# Patient Record
Sex: Female | Born: 1935 | Race: White | Hispanic: No | Smoking: Former smoker
Health system: Southern US, Community
[De-identification: ages and names within clinical notes are randomized; demographics above are authoritative.]

## PROBLEM LIST (undated history)

## (undated) DIAGNOSIS — F0390 Unspecified dementia without behavioral disturbance: Secondary | ICD-10-CM

## (undated) DIAGNOSIS — I639 Cerebral infarction, unspecified: Secondary | ICD-10-CM

## (undated) DIAGNOSIS — M419 Scoliosis, unspecified: Secondary | ICD-10-CM

## (undated) DIAGNOSIS — K279 Peptic ulcer, site unspecified, unspecified as acute or chronic, without hemorrhage or perforation: Secondary | ICD-10-CM

## (undated) DIAGNOSIS — M159 Polyosteoarthritis, unspecified: Secondary | ICD-10-CM

## (undated) DIAGNOSIS — M1990 Unspecified osteoarthritis, unspecified site: Secondary | ICD-10-CM

## (undated) DIAGNOSIS — N200 Calculus of kidney: Secondary | ICD-10-CM

## (undated) DIAGNOSIS — C801 Malignant (primary) neoplasm, unspecified: Secondary | ICD-10-CM

## (undated) DIAGNOSIS — I495 Sick sinus syndrome: Secondary | ICD-10-CM

## (undated) DIAGNOSIS — M6281 Muscle weakness (generalized): Secondary | ICD-10-CM

## (undated) DIAGNOSIS — D649 Anemia, unspecified: Secondary | ICD-10-CM

## (undated) DIAGNOSIS — G9340 Encephalopathy, unspecified: Secondary | ICD-10-CM

## (undated) DIAGNOSIS — K8689 Other specified diseases of pancreas: Secondary | ICD-10-CM

## (undated) DIAGNOSIS — I959 Hypotension, unspecified: Secondary | ICD-10-CM

## (undated) DIAGNOSIS — J189 Pneumonia, unspecified organism: Secondary | ICD-10-CM

## (undated) DIAGNOSIS — I509 Heart failure, unspecified: Secondary | ICD-10-CM

## (undated) DIAGNOSIS — K529 Noninfective gastroenteritis and colitis, unspecified: Secondary | ICD-10-CM

## (undated) DIAGNOSIS — R627 Adult failure to thrive: Secondary | ICD-10-CM

## (undated) HISTORY — PX: JOINT REPLACEMENT: SHX530

## (undated) HISTORY — PX: ABDOMINAL HYSTERECTOMY: SHX81

## (undated) HISTORY — PX: OTHER SURGICAL HISTORY: SHX169

## (undated) HISTORY — PX: CHOLECYSTECTOMY: SHX55

---

## 1998-08-02 ENCOUNTER — Encounter: Payer: Self-pay | Admitting: Gynecology

## 1998-08-09 ENCOUNTER — Inpatient Hospital Stay (HOSPITAL_COMMUNITY): Admission: RE | Admit: 1998-08-09 | Discharge: 1998-08-14 | Payer: Self-pay | Admitting: Orthopedic Surgery

## 1998-08-28 ENCOUNTER — Other Ambulatory Visit: Admission: RE | Admit: 1998-08-28 | Discharge: 1998-08-28 | Payer: Self-pay | Admitting: Obstetrics and Gynecology

## 1999-08-08 ENCOUNTER — Encounter: Payer: Self-pay | Admitting: Internal Medicine

## 1999-08-08 ENCOUNTER — Ambulatory Visit (HOSPITAL_COMMUNITY): Admission: RE | Admit: 1999-08-08 | Discharge: 1999-08-08 | Payer: Self-pay | Admitting: Internal Medicine

## 1999-09-16 ENCOUNTER — Other Ambulatory Visit: Admission: RE | Admit: 1999-09-16 | Discharge: 1999-09-16 | Payer: Self-pay | Admitting: Obstetrics and Gynecology

## 2000-05-18 ENCOUNTER — Encounter: Payer: Self-pay | Admitting: Orthopedic Surgery

## 2000-05-20 ENCOUNTER — Inpatient Hospital Stay (HOSPITAL_COMMUNITY): Admission: RE | Admit: 2000-05-20 | Discharge: 2000-05-25 | Payer: Self-pay | Admitting: Orthopedic Surgery

## 2000-05-20 ENCOUNTER — Encounter: Payer: Self-pay | Admitting: Orthopedic Surgery

## 2000-05-25 ENCOUNTER — Inpatient Hospital Stay (HOSPITAL_COMMUNITY)
Admission: RE | Admit: 2000-05-25 | Discharge: 2000-06-01 | Payer: Self-pay | Admitting: Physical Medicine & Rehabilitation

## 2000-08-18 ENCOUNTER — Encounter: Payer: Self-pay | Admitting: Obstetrics and Gynecology

## 2000-08-18 ENCOUNTER — Ambulatory Visit (HOSPITAL_COMMUNITY): Admission: RE | Admit: 2000-08-18 | Discharge: 2000-08-18 | Payer: Self-pay | Admitting: Obstetrics and Gynecology

## 2000-09-16 ENCOUNTER — Other Ambulatory Visit: Admission: RE | Admit: 2000-09-16 | Discharge: 2000-09-16 | Payer: Self-pay | Admitting: Obstetrics and Gynecology

## 2001-03-12 ENCOUNTER — Ambulatory Visit (HOSPITAL_COMMUNITY): Admission: RE | Admit: 2001-03-12 | Discharge: 2001-03-12 | Payer: Self-pay | Admitting: Family Medicine

## 2001-03-12 ENCOUNTER — Encounter: Payer: Self-pay | Admitting: Family Medicine

## 2001-07-29 ENCOUNTER — Encounter: Payer: Self-pay | Admitting: Orthopedic Surgery

## 2001-08-05 ENCOUNTER — Observation Stay (HOSPITAL_COMMUNITY): Admission: RE | Admit: 2001-08-05 | Discharge: 2001-08-06 | Payer: Self-pay | Admitting: Orthopedic Surgery

## 2002-03-21 ENCOUNTER — Observation Stay (HOSPITAL_COMMUNITY): Admission: RE | Admit: 2002-03-21 | Discharge: 2002-03-22 | Payer: Self-pay | Admitting: Urology

## 2002-03-21 ENCOUNTER — Encounter: Payer: Self-pay | Admitting: Urology

## 2002-04-19 ENCOUNTER — Ambulatory Visit (HOSPITAL_COMMUNITY): Admission: RE | Admit: 2002-04-19 | Discharge: 2002-04-19 | Payer: Self-pay | Admitting: Obstetrics and Gynecology

## 2002-04-19 ENCOUNTER — Encounter: Payer: Self-pay | Admitting: Obstetrics and Gynecology

## 2003-05-16 ENCOUNTER — Ambulatory Visit (HOSPITAL_COMMUNITY): Admission: RE | Admit: 2003-05-16 | Discharge: 2003-05-16 | Payer: Self-pay | Admitting: Obstetrics and Gynecology

## 2003-05-18 ENCOUNTER — Other Ambulatory Visit: Admission: RE | Admit: 2003-05-18 | Discharge: 2003-05-18 | Payer: Self-pay | Admitting: Obstetrics and Gynecology

## 2003-07-20 ENCOUNTER — Encounter: Admission: RE | Admit: 2003-07-20 | Discharge: 2003-07-20 | Payer: Self-pay | Admitting: Orthopedic Surgery

## 2003-07-21 ENCOUNTER — Ambulatory Visit (HOSPITAL_BASED_OUTPATIENT_CLINIC_OR_DEPARTMENT_OTHER): Admission: RE | Admit: 2003-07-21 | Discharge: 2003-07-21 | Payer: Self-pay | Admitting: Orthopedic Surgery

## 2003-07-21 ENCOUNTER — Ambulatory Visit (HOSPITAL_COMMUNITY): Admission: RE | Admit: 2003-07-21 | Discharge: 2003-07-21 | Payer: Self-pay | Admitting: Orthopedic Surgery

## 2004-05-27 ENCOUNTER — Ambulatory Visit (HOSPITAL_COMMUNITY): Admission: RE | Admit: 2004-05-27 | Discharge: 2004-05-27 | Payer: Self-pay | Admitting: Obstetrics and Gynecology

## 2004-06-20 ENCOUNTER — Other Ambulatory Visit: Admission: RE | Admit: 2004-06-20 | Discharge: 2004-06-20 | Payer: Self-pay | Admitting: Obstetrics and Gynecology

## 2005-05-29 ENCOUNTER — Ambulatory Visit (HOSPITAL_COMMUNITY): Admission: RE | Admit: 2005-05-29 | Discharge: 2005-05-29 | Payer: Self-pay | Admitting: Internal Medicine

## 2006-06-01 ENCOUNTER — Ambulatory Visit (HOSPITAL_COMMUNITY): Admission: RE | Admit: 2006-06-01 | Discharge: 2006-06-01 | Payer: Self-pay | Admitting: Obstetrics and Gynecology

## 2006-08-31 ENCOUNTER — Other Ambulatory Visit: Admission: RE | Admit: 2006-08-31 | Discharge: 2006-08-31 | Payer: Self-pay | Admitting: Obstetrics and Gynecology

## 2007-06-18 ENCOUNTER — Ambulatory Visit (HOSPITAL_COMMUNITY): Admission: RE | Admit: 2007-06-18 | Discharge: 2007-06-18 | Payer: Self-pay | Admitting: Obstetrics and Gynecology

## 2007-08-29 ENCOUNTER — Emergency Department (HOSPITAL_COMMUNITY): Admission: EM | Admit: 2007-08-29 | Discharge: 2007-08-29 | Payer: Self-pay | Admitting: Emergency Medicine

## 2008-03-27 ENCOUNTER — Inpatient Hospital Stay (HOSPITAL_COMMUNITY): Admission: EM | Admit: 2008-03-27 | Discharge: 2008-03-30 | Payer: Self-pay | Admitting: Emergency Medicine

## 2008-03-28 ENCOUNTER — Ambulatory Visit: Payer: Self-pay | Admitting: Orthopedic Surgery

## 2008-03-30 ENCOUNTER — Encounter: Payer: Self-pay | Admitting: Orthopedic Surgery

## 2008-06-17 ENCOUNTER — Emergency Department (HOSPITAL_COMMUNITY): Admission: EM | Admit: 2008-06-17 | Discharge: 2008-06-17 | Payer: Self-pay | Admitting: Emergency Medicine

## 2008-07-04 ENCOUNTER — Ambulatory Visit (HOSPITAL_COMMUNITY): Admission: RE | Admit: 2008-07-04 | Discharge: 2008-07-04 | Payer: Self-pay | Admitting: Family Medicine

## 2008-08-03 ENCOUNTER — Ambulatory Visit (HOSPITAL_BASED_OUTPATIENT_CLINIC_OR_DEPARTMENT_OTHER): Admission: RE | Admit: 2008-08-03 | Discharge: 2008-08-03 | Payer: Self-pay | Admitting: Orthopedic Surgery

## 2009-07-19 ENCOUNTER — Ambulatory Visit (HOSPITAL_COMMUNITY): Admission: RE | Admit: 2009-07-19 | Discharge: 2009-07-19 | Payer: Self-pay | Admitting: Family Medicine

## 2010-08-22 LAB — POCT I-STAT, CHEM 8
BUN: 20 mg/dL (ref 6–23)
Calcium, Ion: 1.21 mmol/L (ref 1.12–1.32)
Chloride: 109 mEq/L (ref 96–112)
Glucose, Bld: 92 mg/dL (ref 70–99)

## 2010-08-27 LAB — COMPREHENSIVE METABOLIC PANEL
BUN: 17 mg/dL (ref 6–23)
CO2: 28 mEq/L (ref 19–32)
Chloride: 103 mEq/L (ref 96–112)
Creatinine, Ser: 0.59 mg/dL (ref 0.4–1.2)
GFR calc non Af Amer: >60 mL/min (ref >60)
Total Bilirubin: 0.7 mg/dL (ref 0.3–1.2)

## 2010-08-27 LAB — DIFFERENTIAL
Basophils Absolute: 0 10*3/uL (ref 0.0–0.1)
Lymphocytes Relative: 7 % — ABNORMAL LOW (ref 12–46)
Monocytes Absolute: 0.7 10*3/uL (ref 0.1–1.0)
Monocytes Relative: 6 % (ref 3–12)
Neutro Abs: 11.4 10*3/uL — ABNORMAL HIGH (ref 1.7–7.7)

## 2010-08-27 LAB — URINALYSIS, ROUTINE W REFLEX MICROSCOPIC
Nitrite: NEGATIVE
Specific Gravity, Urine: >1.03 — ABNORMAL HIGH (ref 1.005–1.030)
Urobilinogen, UA: 0.2 mg/dL (ref 0.0–1.0)
pH: 5.5 (ref 5.0–8.0)

## 2010-08-27 LAB — URINE MICROSCOPIC-ADD ON

## 2010-08-27 LAB — CBC
Hemoglobin: 13.6 g/dL (ref 12.0–15.0)
RBC: 4.26 MIL/uL (ref 3.87–5.11)

## 2010-08-27 LAB — PROTIME-INR: INR: 1 (ref 0.00–1.49)

## 2010-09-24 NOTE — Op Note (Signed)
NAMEWANELL, LORENZI             ACCOUNT NO.:  [id number]   MEDICAL RECORD NO.:  [id number]          PATIENT TYPE:  AMB   LOCATION:  DSC                          FACILITY:  [facility]   PHYSICIAN:  Cindee Salt, M.D.       DATE OF BIRTH:  1918/11/08   DATE OF PROCEDURE:  08/03/2008  DATE OF DISCHARGE:                               OPERATIVE REPORT   PREOPERATIVE DIAGNOSIS:  Carpal tunnel syndrome, right hand.   POSTOPERATIVE DIAGNOSIS:  Carpal tunnel syndrome, right hand.   OPERATION:  Decompression, right median nerve.   SURGEON:  Cindee Salt, MD   ASSISTANT:  Carolyne Fiscal, RN   ANESTHESIA:  Forearm-based IV regional.   ANESTHESIOLOGIST:  Zenon Mayo, MD.   HISTORY:  The patient is an 77 year old female with a history of severe  carpal tunnel syndrome, right hand and this has not responded to  conservative treatment.  She has elected to undergo surgical  decompression.  Pre, peri, and postoperative course has been discussed  along with risks and complications.  She is aware that there is no  guarantee with the surgery, possibility of infection, recurrence injury  to arteries, nerves, and tendons, incomplete relief of symptoms, and  dystrophy.  In the preoperative area, the patient is seen.  The  extremity was marked by both the patient and surgeon.  Antibiotic is  given.   PROCEDURE:  The patient was brought to the operating room where a  forearm-based IV regional anesthetic was carried out without difficulty.  She was prepped using ChloraPrep, supine position with the right arm  free.  A time-out was taken confirming the patient and procedure.  A  longitudinal incision was made in the palm and carried down through the  subcutaneous tissue.  She had some feeling.  This was then locally  infiltrated with 0.25% Marcaine without epinephrine.  The dissection was  carried down splitting the palmar fascia.  The superficial palmar arch  was identified.  The flexor tendon of the ring  little finger was  identified to the ulnar side of median nerve.  The carpal retinaculum  was incised with sharp dissection.  A right-angle and Sewall retractor  were placed between the skin and forearm fascia.  The fascia was  released for approximately a centimeter and half proximal to the wrist  crease under direct vision.  The canal was explored.  Area of  compression to the nerve was immediately apparent.  No further lesions  were identified.  Tenosynovial tissue was moderately thickened.  The  wound was irrigated with saline.  The skin was then closed with  interrupted 5-0 Vicryl Rapide sutures.  A sterile compressive dressing  and splint to the  wrist were applied leaving the fingers free.  On deflation of the  tourniquet, all fingers immediately pinked.  She was taken to the  recovery room for observation in satisfactory condition.  She will be  discharged home to return to the [facility] in 1 week on  Vicodin.           ______________________________  Cindee Salt, M.D.  GK/MEDQ  D:  08/03/2008  T:  08/03/2008  Job:  [id number]   cc:   Angus G. Renard Matter, MD

## 2010-09-24 NOTE — Discharge Summary (Signed)
NAMEAMOY, STEEVES             ACCOUNT NO.:  [id number]   MEDICAL RECORD NO.:  [id number]          PATIENT TYPE:  INP   LOCATION:  A317                          FACILITY:  [facility]   PHYSICIAN:  Vickki Hearing, M.D.DATE OF BIRTH:  Apr 14, 1919   DATE OF ADMISSION:  03/27/2008  DATE OF DISCHARGE:  11/19/2009LH                               DISCHARGE SUMMARY   ADMITTING DIAGNOSIS:  Fractured superior and inferior pubic ramus, left  pelvis.   DISCHARGE DIAGNOSIS:  Fractured superior and inferior pubic ramus, left  pelvis.   HISTORY:  She is 77 years old.  She fell on the day of admission.  She  injured her left pelvis, minimally displaced nonoperative fracture.  She  has a history of 2 right total hip arthroplasties, one left total knee  arthroplasty, status post hysterectomy, cholecystectomy, renal stone  extraction.   Family history of breast cancer.   Allergic to SULFA.  ASPIRIN causes GI upset.   History of UTI, endometrial cancer with no recurrence.   She has a congenital heart murmur and rheumatoid arthritis.   She was admitted, placed on bedrest.  When she tried to get up, she had  pain, which would not allow weightbearing with a walker and assistance,  so we recommended skilled nursing care, she agreed   Orthopedic instructions, weightbear as tolerated.  Advance therapy as  tolerated with a walker.  Full weightbearing as tolerated.   MEDICATIONS:  1. Prednisone 2 mg p.o. daily.  2. Fosamax 70 mg p.o. once a week.  3. Pain medicine Darvocet 1 q. 4 p.r.n. for pain.  4. DVT prevention, heparin 5000 units subcu q.8 h. for balance of 28      days, which will be 25 more days from now.   Followup in a month with Dr. Romeo Apple [phone].   DISCHARGE DISPOSITION:  Skilled nursing care [facility] or [facility] to be determined      Vickki Hearing, M.D.  Electronically Signed     SEH/MEDQ  D:  03/30/2008  T:  03/30/2008  Job:  [id number]

## 2010-09-24 NOTE — H&P (Signed)
NAMEKAREM, FARHA             ACCOUNT NO.:  [id number]   MEDICAL RECORD NO.:  [id number]          PATIENT TYPE:  INP   LOCATION:  A317                          FACILITY:  [facility]   PHYSICIAN:  Vickki Hearing, M.D.DATE OF BIRTH:  1918-08-19   DATE OF ADMISSION:  03/27/2008  DATE OF DISCHARGE:  LH                              HISTORY & PHYSICAL   CHIEF COMPLAINT:  Pain in the left groin.   HISTORY:  This is an 77 year old female who presented after a fall on  the day of admission.  She is known to have history of 2 right total hip  arthroplasties, a left total knee arthroplasty, status post a  hysterectomy and cholecystectomy, history of renal stones with  extraction.  Family history is significant for breast cancer.  She is  allergic to SULFA.  ASPIRIN causes GI upset.  She has history of UTI and  endometrial cancer with no recurrence.  Back in 1985, she had a  congenital heart murmur and rheumatoid arthritis.   She fell, she came to the emergency room on March 27, 2008, via EMS.  She was worked up and found to have a left superior and inferior pubic  ramus fracture and could not walk.  She has no primary care physician.  She says she sees Dr. Renard Matter on occasion.  She has no other complaints.  Her medicines are Fosamax once a week and 2 mg of prednisone daily.   PHYSICAL EXAMINATION:  VITAL SIGNS:  This morning were 97.9 temperature,  pulse 69, respiratory rate 18, blood pressure 118/69.  APPEARANCE:  She was small frame, normal development, grooming, hygiene.  No gross deformities.  CARDIOVASCULAR:  Showed good pulse and perfusion.  No swelling or edema.  LYMPH NODES:  Negative in groin.  SKIN:  Warm, dry, and intact with no lesions in 4 extremities.  NEUROLOGIC:  She was awake, alert, and oriented x3.  Mood and affect  normal.  Coordination tests were normal.  Sensation was normal.  Reflexes in the lower extremities were normal.  Upper extremities  normal.  PSYCHIATRIC:  Normal, alert and orientation x3.  Mood and affect normal.  MUSCULOSKELETAL:  Normal upper extremities on inspection with no  contractures.  No muscle atrophy, normal muscle tone, and no instability  or joint subluxation.  EXTREMITIES:  Right lower extremity on inspection was well aligned with  normal range of motion, motor strength, and stability of the hip, knee,  and ankle.  Left lower extremity exam was held in slight external  rotation, but equal leg lengths to the right.  There was pain with  internal rotation.  There was a normal muscle tone, no joint  contractures, no joint subluxation, and there was no tenderness over the  greater trochanter.   Radiographs show a minimally displaced superior and inferior pubic ramus  fracture on the left and old right total hip arthroplasty which seems  like it has some cup invagination into the pelvis, but it is stable.   ASSESSMENT:  An 77 year old female lives alone with pelvic fracture  nonsurgical, requires of 24 hours of bedrest, pain  management, and then  aggressive physical therapy, and then transfer to skilled nursing.      Vickki Hearing, M.D.  Electronically Signed     SEH/MEDQ  D:  03/28/2008  T:  03/28/2008  Job:  [id number]

## 2010-09-27 NOTE — Op Note (Signed)
[name], [name]                       ACCOUNT NO.:  [id number]   MEDICAL RECORD NO.:  [id number]                   PATIENT TYPE:  AMB   LOCATION:  DAY                                  FACILITY:  [facility]   PHYSICIAN:  Bertram Millard. Dahlstedt, M.D.          DATE OF BIRTH:  1918/08/16   DATE OF PROCEDURE:  DATE OF DISCHARGE:  03/21/2002                                 OPERATIVE REPORT   PREOPERATIVE DIAGNOSIS:  Right renal calculi.   POSTOPERATIVE DIAGNOSIS:  Right renal calculi.   PRINCIPAL PROCEDURE:  1. Cystoscopy.  2. Right retrograde ureteropyelogram.  3. Homium laser lithotripsy, right lower pole stones, ureteroscopically.  4. Double-J stent placement.  5. Interpretation of fluoroscopy.   SURGEON:  Bertram Millard. Dahlstedt, M.D.   ANESTHESIA:  General with LMA.   COMPLICATIONS:  None.   BRIEF HISTORY:  This 77 year old female status post two prior lithotripsies  in [place].  She recently moved to [place].  The patient has recurrent  right flank pain.  The stone, which was at her UPJ, was incompletely treated  with those two prior lithotripsies.  It was felt that, especially with her  body habitus of scoliosis, it would be quite difficult and probably  unsuccessful to repeat extracorporeal shock wave lithotripsy.  It was  recommended that she undergo direct ureteroscopy and holmium laser  lithotripsy.  Risks and complications as well as alternative to ESWL were  discussed with this patient.  She desires to proceed.   As the patient has scoliosis, is prednisone dependent, and is elderly, it  was recommended that she spend the night in the hospital.   DESCRIPTION OF PROCEDURE:  The patient was administered preoperative IV  antibiotics and Solu-Cortef and was taken to the operating room where  general anesthetic was induced.  She was placed in the dorsal lithotomy  position.  Her genitalia and perineum were prepped and draped.  A 25 French  panendoscope was advanced  into her bladder which was inspected and found to  be normal.  A bulb tip retrograde was performed showing no hydronephrosis  and a normal right ureter.  Filling defects were seen at the lower pole  calyx.  A guidewire was then advanced into the right renal pelvis and over  top of this a 35-cm x 15 French ureteral dilator access sheath.  The core  was removed and flexible ureteroscope was passed through the  ureteral  catheter and into the right renal pelvis.  The scope was used to inspect the  renal pelvis and calyces which were found to be otherwise normal except for  a moderate sized stone in the lower pole calyx.  A 240-micron laser tip was  then passed through the ureteroscope and the holmium laser was used to  fragment the one stone into multiple small fragments.  It was quite a dense  stone and took approximately 20 minutes of fragmenting.  Approximately 8-10  fragments were  then produced.  The original stone size was 8 mm, so it was  felt that more than likely the smaller fragments would pass.  Once  fragmentation was adequate, the guidewire was introduced into the right  renal pelvis through the access sheath, the access sheath removed, and the  scope replaced in the bladder.  The 24-cm x 6 French double-J stent was then  placed into the right ureter over the guidewire using fluoroscopic and  cystoscopic guidance.  Good curls were seen proximally and distally once the  guidewire was removed.  The bladder was drained and the procedure  terminated.  The patient tolerated the procedure well.  She was taken to the  PACU in stable condition.                                                 Bertram Millard. Dahlstedt, M.D.    SMD/MEDQ  D:  03/21/2002  T:  03/21/2002  Job:  [id number]   cc:   Angus G. Renard Matter, M.D.

## 2010-09-27 NOTE — Discharge Summary (Signed)
[facility]  Patient:    [name], [name]               MRN: [id number] Adm. Date:  [id number] Disc. Date: [id number] Attending:  Faith Rogue T Dictator:   Dian Situ, P.A. CC:         Butch Penny, M.D.             Trudee Grip, M.D.                           Discharge Summary  DISCHARGE DIAGNOSES:  1. Status post right total hip replacement.  2. Postoperative anemia.  3. Degenerative joint disease.  HISTORY OF PRESENT ILLNESS:  [name] is a 77 year old female with history of right total hip replacement in 1981.  Did well until recently.  Noted to have increased pain and difficulty with ambulation.  X-rays revealed loosening of hip prosthesis and patient elected to undergo right total hip replacement on January 9 by Dr. Despina Hick.  Postoperative is touchdown weightbearing and on Coumadin for DVT prophylaxis.  INR currently 2.2.  She is improving in her ability to maintain a weightbearing status.  Is min-to-modest assist for transfers.  Mod assist to ambulate 10 feet.  PAST MEDICAL HISTORY:  Significant for heart murmur, right total hip replacement, left total knee replacement, hysterectomy, cholecystectomy, excision of renal calculi.  History of bursitis, bilateral shoulders.  ALLERGIES:  ASPIRIN and SULFA.  SOCIAL HISTORY:  The patient lives alone in [place] in a one-level home with two steps at entry.  Was independent with a cane prior to admission.  Has history of remote tobacco use and to accept alcohol occasionally.  HOSPITAL COURSE: #1 - [name] was admitted to rehab on May 25, 2000 for inpatient therapy to consist of PT and OT daily.  Past admission the patient has been started on touchdown weightbearing status.  However, secondary to problems maintaining her touchdown weightbearing status she has essentially been maintaining nonweightbearing status on her right lower extremity. Coumadin  was continued for DVT prophylaxis and patient is to remain on Coumadin for 4 additional weeks past discharge to complete her DVT prophylaxis course.  PT-INR at time of discharge is therapeutic and patient is discharged on 2 mg per day Coumadin.  Home Health arranged to draw protimes January 23, with [facility] pharmacy to follow and adjust Coumadin as needed.  Laboratory on admission showed H&H 9.5 and 28.9, white count 8.1, platelets 323.  Electrolytes within normal limits.  A UA and UC done.  It showed insignificant growth.  The patients hip incision has healed well without any signs or symptoms of infection.  No rash or erythema noted.  The patient has had some complaints of right groin pain that is relieved with p.r.n. use of muscle relaxers.  The pain is reasonably controlled.  At time of discharge, [name] is at supervision level for ADL needs, supervision level for transfers and ambulation.  Ambulation is limited secondary to weightbearing status and a wheelchair was ordered to help with long distances.  Further progressive Home Health PT/OT to be continued by [facility] services past discharge.  CONDITION ON DISCHARGE:  On June 01, 2000, the patient is discharged to home in improved condition.  DISCHARGE MEDICATIONS:  1. Coumadin 2 mg per day through February 23.  2. Oxycodone IR 5 mg 1-2 p.o. q.4-6h. p.r.n. pain.  3. Trinsicon 1 p.o. b.i.d.  4. Vioxx  25 mg per day.  5. Protonix 40 mg per day.  6. Robaxin 500 mg 1 p.o. q.8h. p.r.n. spasm.  ACTIVITY:  Total hip precautions.  Touchdown weight right lower extremity.  WOUND CARE:  Keep area clean and dry.  SPECIAL INSTRUCTIONS:  No smoking.  No driving.  No aspirin, ibuprofen, or Aleve while on Coumadin.  [facility] PT/OT and R.N. to follow up.  FOLLOWUP:  The patient is to follow up with Dr. Despina Hick for recheck in one week.  Follow up with Dr. Riley Kill as needed. DD:  06/01/00 TD:  06/02/00 Job:  [id number]

## 2010-09-27 NOTE — H&P (Signed)
[facility]  Patient:    [name], [name]                      MRN: [id number] Attending:  Ollen Gross, M.D. Dictator:   Ralene Bathe, P.A.-C.                         History and Physical  DATE OF BIRTH:  January 15, 1919  CHIEF COMPLAINT:  Pain in right hip, status post right total hip arthroplasty. She is scheduled for surgery on May 20, 2000.  BRIEF HISTORY:  Ms. Olafson is a 77 year old female, referred to Dr. Lequita Halt by Dr. Fannie Knee for consideration of revision right total hip.  She had her first primary done in 1981 and has done extremely well except has had progressive pain, especially at the lateral hip, buttock, and groin area.  This is bothering her mostly when she gets up from a seated position and after taking several steps.  It has become more debilitating, and she is having a harder time getting around.  She has also developed leg length discrepancy.  She has been followed and was noted by Dr. Fannie Knee to have possible loosening of the hip and was referred to Dr. Lequita Halt for consideration or revision.  On exam, she had about 3/4 inch leg length discrepancy, symptoms of right greater trochanteric, and x-rays did demonstrate complete radiolucency around the cemented polyethylene acetabular shell.  She also has had migration with tilting to a little more vertical position which is a significant change from two years ago.  The femoral column actually looked to be intact with no lysis or definite evidence of loosening.  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  She possibly would need some bone grafting along with exchange of the polyethylene liner.  Risks and benefits were discussed with the patient at length, and she is in agreement and wishes to proceed.  PAST MEDICAL HISTORY: 1. Osteoarthritis. 2. History of congenital heart murmur. 3. History of postoperative hemorrhagic anemia  in the past.  She did not    donate autologous blood for this procedure.  PAST SURGICAL HISTORY: 1. Right total hip arthroplasty in 1981. 2. Left total knee arthroplasty in 2000. 3. Hysterectomy in 1985. 4. Removal of kidney stones and cholecystectomy in 1994.  FAMILY HISTORY:  Significant for mother with breast cancer.  ALLERGIES:  ASPIRIN, SULFA causes rash.  MEDICATIONS: 1. Vioxx 25 mg q.d. 2. Fosamax 70 mg weekly.  SOCIAL HISTORY:  She does live alone.  Does not smoke.  Drinks social alcohol occasionally.  She has a daughter who will be coming to stay with her postoperatively for assistance.  Her medical physician is actually in [place] which is where she resides some of the time.  She has seen Dr. Megan Mans locally.  REVIEW OF SYSTEMS:  The patient denies any recent fevers, chills, night sweats, no bleeding tendencies.  She has no blurred or double vision, seizure or paralysis.  RESPIRATORY:  No shortness of breath, cough, hemoptysis. CARDIOVASCULAR:  No chest pain, angina, orthopnea.  GI:  Denies constipation, melena, bloody stools.  GU:  No dysuria or hematuria.  MUSCULOSKELETAL:  As pertinent in present illness.  PHYSICAL EXAMINATION:  GENERAL:  Well-developed, well-nourished 77 year old female.  She walks with antalgic gait.  VITAL SIGNS:  Blood pressure 135/70, respirations 18, pulse 60 and regular.  NECK:  Supple.  No lymphadenopathy, no carotid bruits.  CHEST:  Clear to auscultation bilaterally.  No rales or rhonchi.  HEART:  Regular rate and rhythm.  No murmur, gallop, or rub.  She does have distant heart sounds.  ABDOMEN:  Soft, nontender, positive bowel sounds.  EXTREMITIES:  Positive +1 posterior tibial pulses bilaterally.  No edema noted.  IMPRESSION:  Failed acetabular hardware, right total hip arthroplasty.  PLAN:  The patient will be admitted for right acetabular and possible femoral revision if indicated.  Will need polyethylene exchange as  well as possible acetabular bone grafting. DD:  05/19/00 TD:  05/19/00 Job: [id number]

## 2010-09-27 NOTE — H&P (Signed)
[facility]  Patient:    [name], [name] Visit Number: [id number] MRN: [id number]          Service Type: Attending:  Sherri Rad, M.D. Dictated by:   Marcie Bal Troncale, P.A.C. Adm. Date:  08/05/01                           History and Physical  DATE OF BIRTH:  December 20, 1918  SOCIAL SECURITY #:  [id number]  CHIEF COMPLAINT:  Right posterior ankle pain.  HISTORY OF PRESENT ILLNESS:  The patient is an 77 year old female who had an onset of some posterior ankle pain on the right back in October 2002.  She has tried various conservative measures including heel lift as well as prednisone and a cortisone injection with any significant relief.  She was noted to have prominent Haglund deformity and, due to the continued symptoms clinically, she has elected to undergo surgical intervention.  REVIEW OF SYSTEMS:  She denies any recent fever or chills.  No diplopia or blurred vision or headaches.  No rhinorrhea, sore throat, or earache.  No chest pain, shortness of breath, or cough.  No abdominal pain, nausea, vomiting, diarrhea, or constipation.  No melena or bright red blood per rectum.  She is improving from a recent UTI.  Symptoms are improving in this regard.  No numbness or tingling in the extremities.  PAST MEDICAL HISTORY: 1. Rheumatoid arthritis. 2. Congenital heart murmur. 3. Endometrial cancer in 1985, with no recurrence. 4. Recent treatment for a UTI, completed prescription yesterday.  PAST SURGICAL HISTORY: 1. Right total hip arthroplasty. 2. Left total knee arthroplasty. 3. Hysterectomy. 4. Cholecystectomy. 5. Excision of renal calculi.  FAMILY HISTORY:  Significant for breast cancer in her mother.  ALLERGIES:  SULFA causes a rash.  ASPIRIN causes GI upset.  CURRENT MEDICATIONS: 1. Prednisone 6 mg a day. 2. Fosamax. 3. Calcium. 4. Vitamins.  SOCIAL HISTORY:  She remotely used tobacco in the past.  She rarely uses alcohol on  a social basis.  She lives alone, but her daughter will be staying with her after surgery.  PRIMARY CARE PHYSICIAN:  Dr. Renard Matter.  PHYSICAL EXAMINATION:  VITAL SIGNS:  Afebrile, pulse 56, respiratory rate 14, blood pressure 144/76.  GENERAL:  Well-developed, well-nourished female in no acute distress.  HEENT:  Head atraumatic, normocephalic.  Pupils are equal, round, and reactive to light.  Extraocular movements grossly intact.  Oropharynx clear, without redness, exudate, or lesions.  NECK:  Supple.  No cervical lymphadenopathy and no carotid bruits.  CHEST:  Clear to auscultation bilaterally.  No wheezes or crackles.  She does have significant kyphotic and scoliotic back curves in alignment.  HEART:  Regular rate and rhythm with a 2/6 systolic ejection murmur.  ABDOMEN:  Soft, nontender, nondistended.  No masses, no hepatosplenomegaly.  BREASTS, GENITOURINARY:  Not examined, not pertinent to present illness.  SKIN:  Intact.  Without rashes or lesions.  EXTREMITIES:  Tender retrocalcaneal bursa and bony prominence over the right posterior heel.  Motor and sensory are grossly intact.  LABORATORY DATA:  X-rays of the right ankle demonstrate calcification within the Achilles tendon and a prominent Haglund deformity.  IMPRESSION: 1. Right Haglund deformity and tight gastrocnemius. 2. Rheumatoid arthritis. 3. History of endometrial cancer. 4. Congenital heart murmur. 5. Recent treatment for urinary tract infection.  PLAN:  The patient will be admitted to [facility] to undergo a right Haglund deformity excision and gastrocnemius  slide by Dr. Lestine Box on August 05, 2001.  We are awaiting preoperative medical clearance from Dr. Renard Matter. Preoperative laboratories and signed surgical consents will be obtained.  All questions have been encouraged and answered for the patient. Dictated by:   Marcie Bal Troncale, P.A.C. Attending:  Sherri Rad, M.D. DD:  07/30/01 TD:   07/31/01 Job: [id number]

## 2010-09-27 NOTE — Op Note (Signed)
[name], [name]                       ACCOUNT NO.:  [id number]   MEDICAL RECORD NO.:  [id number]                   PATIENT TYPE:  AMB   LOCATION:  DSC                                  FACILITY:  [facility]   PHYSICIAN:  Ollen Gross, M.D.                 DATE OF BIRTH:  [birth year]   DATE OF PROCEDURE:  07/21/2003  DATE OF DISCHARGE:                                 OPERATIVE REPORT   PREOPERATIVE DIAGNOSIS:  Left carpal tunnel syndrome.   POSTOPERATIVE DIAGNOSIS:  Left carpal tunnel syndrome.   PROCEDURE:  Left carpal tunnel release.   SURGEON:  Ollen Gross, M.D.   ASSISTANT:  None.   ANESTHESIA:  Local with MAC.   ESTIMATED BLOOD LOSS:  Minimal.   DRAINS:  TLS x 1.   COMPLICATIONS:  None.   CONDITION:  Stable to the recovery room.   BRIEF CLINICAL NOTE:  [name] is an 77 year old female with severe  carpal tunnel syndrome of the left wrist.  She presents now for left carpal  tunnel release.   PROCEDURE IN DETAIL:  After successful administration of MAC anesthetic, the  left upper extremity was prepped and draped in the usual sterile fashion.  I  infiltrated the incision line with 10 mL of 0.25% Marcaine.  We then wrapped  the hand in an Esmarch and inflated the tourniquet to 250 mmHg.  An incision  was made with a 10 blade through the subcutaneous tissue and then scissors  used to dissect through the superficial palmar fascia to get to the  transverse carpal ligament.  A Freer elevator was placed between the  ligament and nerve to protect the nerve.  The ligament is then incised and  the nerve identified.  There is some slight flattening of the nerve from the  compression.  We then did the dissection all the way back to the superficial  forearm fascia to relieve any further compression on the nerve.  I then  released the tourniquet and minor bleeding is stopped with bipolar cautery.  The wound is irrigated with saline solution and a TLS drain is placed to  exit distal to the incision.  The incision is closed with interrupted 4-0  nylon.  A drain is hooked up to the suction.  A bulky sterile dressing and  dorsal splint are applied.  She is awakened and transported to the recovery  room in stable condition.                                               Ollen Gross, M.D.    FA/MEDQ  D:  07/21/2003  T:  07/22/2003  Job:  [id number]

## 2010-09-27 NOTE — Op Note (Signed)
[facility]  Patient:    [name], [name] Visit Number: [id number] MRN: [id number]          Service Type: SUR Location: 4W 0473 01 Attending Physician:  Sherri Rad Dictated by:   Sherri Rad, M.D. Proc. Date: 08/05/01 Admit Date:  08/05/2001                             Operative Report  PREOPERATIVE DIAGNOSES:  1. Right Haglunds deformity.  2. Right tight gastroc.  POSTOPERATIVE DIAGNOSES:  1. Right Haglunds deformity.  2. Right tight gastroc.  OPERATION PERFORMED:  1. Right Haglunds deformity excision.  2. Right gastroc slide.  ANESTHESIA:  General endotracheal tube.  SURGEON:  Sherri Rad, M.D.  ASSISTANT:  Avel Peace, P.A.-C.  ESTIMATED BLOOD LOSS:  Minimal.  TOURNIQUET TIME:  31 minutes.  COMPLICATIONS:  None.  DISPOSITION:  Stable to PR.  INDICATIONS FOR PROCEDURE:  This is an 77 year old female who has had persistent posterior heel pain for several years that has been on and off in nature until the point where it is interfering with her life where she can not do what she wanted to do. She was consented for the above procedure. All risks which include infection, neurovessel injury, possible Achilles tendon rupture, persistent pain, worsening pain, and stiffness were all explained nondistended questions were answered.  DESCRIPTION OF PROCEDURE:  The patient was brought to the operating room, placed in the supine position after adequate general endotracheal tube anesthesia was administered as well as Ancef 1 gm IV piggyback. The right lower extremity was then prepped and draped in a sterile manner on a proximally placed thigh tourniquet in prone position. The procedure commenced with a medial incision over the medial gastroc muscle tendinous junction. Dissection was carried down through adipose tissue, hemostasis was obtained, the fascia was opened in line with the incision. The gastroc slowly released, ______  was identified. The soft tissues were elevated off the posterior aspect of the gastroc tendon. The sural nerve was identified and protected out of harms way with the retractor. The gastroc tendon was then released with the Mayo scissors. The wound was copiously irrigated with normal saline. The subcu was closed with 3-0 Vicryl, skin was closed with 4-0 monocryl. Steri-Strips were applied. A longitudinal incision was then made over the lateral border of the Achilles tendon over the ______ tuber after the tourniquet was elevated to 290 mmHg and the limb was gravity exsanguinated. Dissection was carried down anterior to the Achilles tendon. There was a large amount of synovitis in this area and this was debrided with the rongeur. The Haglunds area was identified and soft tissues were elevated. Then with an oscillating saw, the Haglunds deformity was removed. A portion of it distally was then removed with the rongeur until there was nothing palpable through the skin across the tendon. Once this was done, the wound was copiously irrigated with normal saline. In the central portion of the tendon, there was a disease portion of the tendon which was identified; however, the tendon was intact throughout to the calcaneus. Once the wound was copiously irrigated with normal saline, the skin was closed with 4-0 nylon. A sterile dressing was applied with the ankle in neutral position and a Cam Walker boot was applied. The patient was stable to the PR. Dictated by:   Sherri Rad, M.D. Attending Physician:  Sherri Rad DD:  08/05/01 TD:  08/06/01 Job: [id number]

## 2010-09-27 NOTE — Op Note (Signed)
[facility]  Patient:    [name], [name]               MRN: [id number] Proc. Date: 05/20/00 Adm. Date:  [id number] Attending:  Ollen Gross V                           Operative Report  PREOPERATIVE DIAGNOSES:  Failed right total hip arthroplasty.  POSTOPERATIVE DIAGNOSES:  Failed right total hip arthroplasty.  PROCEDURE:  Right total hip arthroplasty revision.  SURGEON:  Dr. Lequita Halt.  ASSISTANT:  Dr. Montez Morita.  ANESTHESIA:  General.  ESTIMATED BLOOD LOSS:  300.  DRAINS:  Hemovac x 1.  COMPLICATIONS:  None.  CONDITION:  Stable to recovery.  BRIEF CLINICAL NOTE:  The patient is a 77 year old female who had a right total hip done by Dr. Fannie Knee approximately 21 years ago. She did great up to the past few years when she had progressively increasing groin pain and progressive leg length discrepancy. She has had significant pain to the point where she would like to have it revised. X-rays show complete lucency around the acetabular component with vertical tilt. Femoral component appears to be in good condition.  DESCRIPTION OF PROCEDURE:  After successful administration of general anesthetic, the patient was placed in the left lateral decubitus position with the right side up and held with the hip positioner. Her right lower extremity was isolated from the perineum with plastic drapes and prepped and draped in the usual sterile fashion. A previous posterolateral incision was made and the skin was cut with a 10 blade through subcutaneous tissue at the level of the fascia lata which was incised in line with the skin incision. The pseudocapsule was then excised off the posterior femur to rebuild the hip joint. The acetabular rim is then cleared posterior and superiorly. The hip is dislocated and she had a one piece Aufranc-Turner prosthesis. This was knocked out the canal with the bone tap. The cement mantle looked great. The femur subsequently  translated anteriorly after exposure obtained. The all polyethylene cemented component had tilted significantly and easily was removed. The remainder of the cement was removed leaving her with a large cavitary defect centrally and superiorly. There was no true segmental defect. Reaming was then started to freshen up the bone with size 49. Reaming performed up to 55. The walls were rather fragile and thus it was decided to stop at that point and put a 56 mm duraloc shell in with multiple screw holes. The defects are grafted with morcellized cancellous allograft and grafton mixture. This effectively filled the cavitary defects and was impacted into place with a great fit. The 56 mm duraloc shell was impacted into the acetabulum with approximately 40 degrees of abduction and 20 degrees of forward flexion. There was a fair pressfit and we decided to augment it with multiple screws. Four screws are placed through the acetabular shell with excellent purchase. The impactor is then placed back into the cup and we attempted to torque the cup and there was absolutely no motion between the cup and pelvis. The whole construct functions as one unit. I was very satisfied with the stability of the cup and then a trial 28 mm 10 degree plus 4 liner was placed.  Given the stability of the cement column and the fact that the stem is a first generation with minimal off-set it was decided to do a tap out tap in procedure in which we  would cement a modern stem with more off-set and smooth contour into this resisting cement mantle. A high speed bur is used to create roughened edges in the current cement mantle and also to shape the current cement mantle to accept the size one endurance luster stem. Once it was completed, then the stem is impacted and the canal actually had a great pressfit at the cement column but we need to cement it into the existing column. The column is thoroughly cleaned and pulsatile  lavage used to further clean it. It is dried out and then cement mixed and injected into the existing canal. The size one endurance stem is then impacted at about 20 degrees of antiversion. Once the cement had fully hardened, the rest of the cement is extruded and had a very very stable fit. The trial plus 5 head is used first and still with a soft tissue laxity that is a plus 8.5 head is used and reduction is performed. Soft tissue tension is excellent. I could her flex her to 70, internal rotate to 90, flex to 90, internal rotate to 70 and ______ stem with full external rotation without dislocating. The permanent plus 8.5 head is then impacted onto the femoral neck. Reduction is performed and the wound copiously irrigated with antibiotic solution. The soft tissue sleeve is reattached to the femur posteriorly. The fascia lata is then closed over one limb of the Hemovac drain. The subcu is closed in 2 layers with interrupted #1 and then 2-0 Vicryl. The skin is closed with staples. The drain is hooked to suction, incision cleaned and dried and a bulky sterile dressing applied. The patient was awakened and transported to the recovery room in stable condition. DD:  05/20/00 TD:  05/20/00 Job: [id number]

## 2010-09-27 NOTE — Discharge Summary (Signed)
[facility]  Patient:    [name], [name]               MRN: [id number] Adm. Date:  [id number] Disc. Date: 05/25/00 Attending:  Loanne Drilling Dictator:   Della Goo, P.A.-C.                           Discharge Summary  ADMITTING DIAGNOSES: 1. Failed acetabular hardware, right total hip replacement. 2. Status post right total hip arthroplasty, 1981. 3. Status post left total knee arthroplasty, 2000. 4. History of congenital heart murmur.  DISCHARGE DIAGNOSES: 1. Failed acetabular hardware, right total hip replacement. 2. Status post right total hip arthroplasty, 1981. 3. Status post left total knee arthroplasty, 2000. 4. History of congenital heart murmur. 5. Posthemorrhagic anemia.  PROCEDURE:  On May 20, 2000, patient underwent right total hip arthroplasty revision performed by Dr. Lequita Halt, assisted by Dr. Montez Morita under general anesthesia.  CONSULTATIONS:  Laurier Nancy, M.D., of physical medicine and rehabilitation.  HISTORY OF PRESENT ILLNESS:  Patient is a 77 year old white female status post right total hip replacement approximately 21 years ago.  Patient recently has developed progressing discomfort and dysfunction of the right lower extremity. She was noted to have leg length discrepancies and x-rays revealed complete lucency around the acetabular component with a vertical tilt.  The femoral component was felt to be in good condition.  With these findings, it was felt she would require revision of the total hip replacement and was admitted for the procedure.  HOSPITAL COURSE:  Patient tolerated the procedure without difficulties under general anesthesia.  Postoperatively, she was placed on Coumadin for DVT and PE prophylaxis.  Adjustments in Coumadin dose were made according to daily pro times by the pharmacist at [facility].  Patient was placed on the usual total hip replacement protocol and received  physical therapy daily for ambulation and gait training as well as range of motion and strengthening exercises.  She was allowed touchdown weightbearing on the operative extremity.  She was taught total hip replacement precautions.  Unfortunately, she was significantly slow with ambulation and a rehab consult was obtained. She was felt to be a suitable candidate for inpatient rehabilitation.  While receiving physical therapy, she did ambulate 10 feet with the physical therapist and maintained a touchdown weightbearing status.  Patients drain was pulled from the wound on the first postoperative day and daily dressing changes were done thereafter.  Her wound was found to be healing well without signs of infection or drainage.  Patient was treated with PCA analgesics initially and weaned to p.o. analgesics without difficulty.  Foley catheter was discontinued and she was able to void without difficulty.  Rehab bed was made available on May 25, 2000, and she was felt to be stable for transfer.  PERTINENT LABORATORY VALUES:  Admission CBC with hemoglobin 12.4, hematocrit 35.6.  Hemoglobin dropped to the lowest value of 9.1 with hematocrit 6.5. Coagulation studies on admission were normal.  She had signs of elevation in PT and INR while on Coumadin during the hospital stay.  Preoperative chemistry studies were within normal limits and repeat on May 21, 2000, showed values of normal with the exception of mildly elevated glucose of 146. Urinalysis on admission showed moderate hemoglobin, small leukocyte esterase, many epithelial cells, 6 to 10 WBCs, 0 to 5 RBCs, with few bacteria.  EKG on admission showed sinus bradycardia with left anterior fascicular block, no  significant changes since last tracing on August 01, 2000.  PLAN:  Patient will be transferred to [facility] inpatient rehabilitation. There, she will continue to have physical therapy daily for ambulation and gait training, range  of motion and strengthening exercises.  She is allowed touchdown weightbearing on the right lower extremity only.  She will continue to adhere to strict total hip replacement precautions to avoid dislocation. Occupational therapy will assist with activities of daily living.  Dressing changes should be done daily.  Her medications will be dosed by the physicians and their staff at the rehabilitation center.  Patient does not have any usual home medications and a list of her p.r.n. medications will be sent with her at the time of discharge.  Patient should follow up with Dr. Lequita Halt following her stay at rehabilitation.  She will continue on Coumadin for the usual protocol and adjustments in her dosage will be made by the physicians at rehabilitation. DD:  05/25/00 TD:  05/25/00 Job: [id number]

## 2011-02-12 LAB — COMPREHENSIVE METABOLIC PANEL
AST: 29
Albumin: 3.7
BUN: 12
Calcium: 8.6
Creatinine, Ser: 0.55
GFR calc Af Amer: >60
Total Protein: 6.1

## 2011-02-12 LAB — DIFFERENTIAL
Basophils Absolute: 0
Basophils Relative: 0
Eosinophils Absolute: 0
Eosinophils Relative: 0
Lymphocytes Relative: 8 — ABNORMAL LOW

## 2011-02-12 LAB — CBC
HCT: 34.3 — ABNORMAL LOW
MCHC: 33.2
Platelets: 168
RDW: 13.7

## 2011-03-11 ENCOUNTER — Ambulatory Visit: Payer: Self-pay | Admitting: Urology

## 2011-04-17 ENCOUNTER — Ambulatory Visit (HOSPITAL_COMMUNITY)
Admission: RE | Admit: 2011-04-17 | Discharge: 2011-04-17 | Disposition: A | Discharge status: Home / Self Care | Payer: Medicare Other | Source: Ambulatory Visit | Attending: Family Medicine | Admitting: Family Medicine

## 2011-04-17 ENCOUNTER — Encounter (HOSPITAL_COMMUNITY): Payer: Self-pay

## 2011-04-17 ENCOUNTER — Other Ambulatory Visit (HOSPITAL_COMMUNITY): Payer: Self-pay | Admitting: Family Medicine

## 2011-04-17 DIAGNOSIS — R52 Pain, unspecified: Secondary | ICD-10-CM

## 2011-04-17 DIAGNOSIS — N200 Calculus of kidney: Secondary | ICD-10-CM | POA: Insufficient documentation

## 2011-04-17 DIAGNOSIS — R933 Abnormal findings on diagnostic imaging of other parts of digestive tract: Secondary | ICD-10-CM | POA: Insufficient documentation

## 2011-04-17 DIAGNOSIS — R109 Unspecified abdominal pain: Secondary | ICD-10-CM | POA: Insufficient documentation

## 2011-04-17 DIAGNOSIS — K838 Other specified diseases of biliary tract: Secondary | ICD-10-CM | POA: Insufficient documentation

## 2011-04-17 DIAGNOSIS — R110 Nausea: Secondary | ICD-10-CM | POA: Insufficient documentation

## 2011-04-17 DIAGNOSIS — Z9089 Acquired absence of other organs: Secondary | ICD-10-CM | POA: Insufficient documentation

## 2011-04-17 HISTORY — DX: Malignant (primary) neoplasm, unspecified: C80.1

## 2011-04-17 MED ORDER — IOHEXOL 300 MG/ML  SOLN
100.0000 mL | Freq: Once | INTRAMUSCULAR | Status: AC | PRN
  Administered 2011-04-17: 100 mL via INTRAVENOUS

## 2011-04-22 ENCOUNTER — Ambulatory Visit (HOSPITAL_COMMUNITY)
Admission: RE | Admit: 2011-04-22 | Discharge: 2011-04-22 | Disposition: A | Discharge status: Home / Self Care | Payer: Medicare Other | Source: Ambulatory Visit | Attending: Family Medicine | Admitting: Family Medicine

## 2011-04-22 ENCOUNTER — Other Ambulatory Visit (HOSPITAL_COMMUNITY): Payer: Self-pay | Admitting: Family Medicine

## 2011-04-22 DIAGNOSIS — R1030 Lower abdominal pain, unspecified: Secondary | ICD-10-CM

## 2011-04-22 DIAGNOSIS — R109 Unspecified abdominal pain: Secondary | ICD-10-CM | POA: Insufficient documentation

## 2011-04-25 ENCOUNTER — Encounter (HOSPITAL_COMMUNITY): Payer: Self-pay | Admitting: Emergency Medicine

## 2011-04-25 ENCOUNTER — Other Ambulatory Visit: Payer: Self-pay

## 2011-04-25 ENCOUNTER — Emergency Department (HOSPITAL_COMMUNITY): Payer: Medicare Other

## 2011-04-25 ENCOUNTER — Observation Stay (HOSPITAL_COMMUNITY)
Admission: EM | Admit: 2011-04-25 | Discharge: 2011-04-30 | Disposition: A | Discharge status: Home Health | Payer: Medicare Other | Attending: Family Medicine | Admitting: Family Medicine

## 2011-04-25 DIAGNOSIS — R509 Fever, unspecified: Secondary | ICD-10-CM | POA: Insufficient documentation

## 2011-04-25 DIAGNOSIS — R1012 Left upper quadrant pain: Secondary | ICD-10-CM | POA: Insufficient documentation

## 2011-04-25 DIAGNOSIS — K56609 Unspecified intestinal obstruction, unspecified as to partial versus complete obstruction: Principal | ICD-10-CM | POA: Insufficient documentation

## 2011-04-25 DIAGNOSIS — K5900 Constipation, unspecified: Secondary | ICD-10-CM | POA: Insufficient documentation

## 2011-04-25 DIAGNOSIS — Z79899 Other long term (current) drug therapy: Secondary | ICD-10-CM | POA: Insufficient documentation

## 2011-04-25 DIAGNOSIS — M4120 Other idiopathic scoliosis, site unspecified: Secondary | ICD-10-CM | POA: Insufficient documentation

## 2011-04-25 DIAGNOSIS — R079 Chest pain, unspecified: Secondary | ICD-10-CM | POA: Insufficient documentation

## 2011-04-25 LAB — CBC
HCT: 35.9 % — ABNORMAL LOW (ref 36.0–46.0)
Hemoglobin: 11.9 g/dL — ABNORMAL LOW (ref 12.0–15.0)
WBC: 9.9 10*3/uL (ref 4.0–10.5)

## 2011-04-25 LAB — DIFFERENTIAL
Lymphocytes Relative: 12 % (ref 12–46)
Monocytes Absolute: 1.2 10*3/uL — ABNORMAL HIGH (ref 0.1–1.0)
Monocytes Relative: 13 % — ABNORMAL HIGH (ref 3–12)
Neutro Abs: 7.4 10*3/uL (ref 1.7–7.7)

## 2011-04-25 LAB — URINALYSIS, ROUTINE W REFLEX MICROSCOPIC
Glucose, UA: NEGATIVE mg/dL
Ketones, ur: 15 mg/dL — AB
Leukocytes, UA: NEGATIVE
Nitrite: NEGATIVE
Protein, ur: NEGATIVE mg/dL

## 2011-04-25 LAB — COMPREHENSIVE METABOLIC PANEL
BUN: 7 mg/dL (ref 6–23)
CO2: 28 mEq/L (ref 19–32)
Chloride: 102 mEq/L (ref 96–112)
Creatinine, Ser: 0.57 mg/dL (ref 0.50–1.10)
GFR calc non Af Amer: 79 mL/min — ABNORMAL LOW (ref >90)
Total Bilirubin: 0.7 mg/dL (ref 0.3–1.2)

## 2011-04-25 LAB — LIPASE, BLOOD: Lipase: 26 U/L (ref 11–59)

## 2011-04-25 LAB — URINE MICROSCOPIC-ADD ON

## 2011-04-25 LAB — CARDIAC PANEL(CRET KIN+CKTOT+MB+TROPI): Relative Index: INVALID (ref 0.0–2.5)

## 2011-04-25 MED ORDER — ENOXAPARIN SODIUM 30 MG/0.3ML ~~LOC~~ SOLN
30.0000 mg | Freq: Every day | SUBCUTANEOUS | Status: DC
  Administered 2011-04-26 – 2011-04-30 (×5): 30 mg via SUBCUTANEOUS
  Filled 2011-04-25 (×5): qty 0.3

## 2011-04-25 MED ORDER — SODIUM CHLORIDE 0.9 % IV BOLUS (SEPSIS)
500.0000 mL | Freq: Once | INTRAVENOUS | Status: AC
  Administered 2011-04-25: 500 mL via INTRAVENOUS

## 2011-04-25 MED ORDER — SODIUM CHLORIDE 0.9 % IV SOLN: INTRAVENOUS | Status: DC

## 2011-04-25 MED ORDER — SODIUM CHLORIDE 0.9 % IV SOLN
INTRAVENOUS | Status: DC
  Administered 2011-04-26: via INTRAVENOUS

## 2011-04-25 MED ORDER — ONDANSETRON HCL 4 MG PO TABS: 4.0000 mg | ORAL_TABLET | Freq: Four times a day (QID) | ORAL | Status: DC | PRN

## 2011-04-25 MED ORDER — OXYCODONE-ACETAMINOPHEN 5-325 MG PO TABS
1.0000 | ORAL_TABLET | ORAL | Status: DC | PRN
  Administered 2011-04-26 (×2): 1 via ORAL
  Filled 2011-04-25 (×2): qty 1

## 2011-04-25 NOTE — ED Notes (Signed)
Pt presents with L upper abd pain.

## 2011-04-25 NOTE — ED Provider Notes (Signed)
Scribed for Donnetta Hutching, MD, the patient was seen in room APA14/APA14 . This chart was scribed by Ellie Lunch.   CSN: [id number] Arrival date & time: 04/25/2011  4:02 PM   First MD Initiated Contact with Patient 04/25/11 1651      Chief Complaint  Patient presents with  . Abdominal Pain    (Consider location/radiation/quality/duration/timing/severity/associated sxs/prior treatment) HPI [name] is a 77 y.o. female who presents to the Emergency Department complaining of  1 week of LUQ/Left lower rib pain. This pain is new. Pain is described as constant.  Pain is worse with movement. Pt denies associated n/v/d. Pt reports some recent subjective fever and an increase in gas.  Pt seen by PCP Dr. Renard Matter this week, had abd Ct scan and xray with no pertinent findings. Pt has been on liquid diet per Dr. Renard Matter.    Past Medical History  Diagnosis Date  . Cancer     uterine surg only    Past Surgical History  Procedure Date  . Joint replacement   . Cholecystectomy     Family History  Problem Relation Age of Onset  . Cancer Mother     History  Substance Use Topics  . Smoking status: Former Smoker    Types: Cigarettes    Quit date: 04/25/1951  . Smokeless tobacco: Never Used  . Alcohol Use:     OB History    Grav Para Term Preterm Abortions TAB SAB Ect Mult Living   3 2 2  1  1          Review of Systems 10 Systems reviewed and are negative for acute change except as noted in the HPI.   Allergies  Aspirin  Home Medications   Current Outpatient Rx  Name Route Sig Dispense Refill  . OXYCODONE-ACETAMINOPHEN 7.5-325 MG PO TABS Oral Take 1 tablet by mouth every 4 (four) hours as needed. For pain     . ONDANSETRON HCL 4 MG PO TABS Oral Take 4 mg by mouth every 6 (six) hours as needed. For nausea       BP 133/66  Pulse 70  Temp(Src) 98.6 F (37 C) (Oral)  Resp 20  Ht 4\' 6"  (1.372 m)  Wt 102 lb (46.267 kg)  BMI 24.59 kg/m2  SpO2 100%  Physical Exam    Nursing note and vitals reviewed. Constitutional: She is oriented to person, place, and time. She appears well-developed and well-nourished.  HENT:  Head: Normocephalic and atraumatic.  Eyes: Conjunctivae and EOM are normal. Pupils are equal, round, and reactive to light.  Neck: Normal range of motion. Neck supple.  Cardiovascular: Normal rate and regular rhythm.   Pulmonary/Chest: Effort normal and breath sounds normal.  Abdominal: Soft. Bowel sounds are normal.  Musculoskeletal: Normal range of motion.  Neurological: She is alert and oriented to person, place, and time.  Skin: Skin is warm and dry.  Psychiatric: She has a normal mood and affect.    ED Course  Procedures (including critical care time) DIAGNOSTIC STUDIES: Oxygen Saturation is 100% on room air, normal by my interpretation.    COORDINATION OF CARE:   Labs Reviewed  CBC - Abnormal; Notable for the following:    RBC 3.73 (*)    Hemoglobin 11.9 (*)    HCT 35.9 (*)    All other components within normal limits  DIFFERENTIAL - Abnormal; Notable for the following:    Monocytes Relative 13 (*)    Monocytes Absolute 1.2 (*)  All other components within normal limits  COMPREHENSIVE METABOLIC PANEL - Abnormal; Notable for the following:    GFR calc non Af Amer 79 (*)    All other components within normal limits  LIPASE, BLOOD  CARDIAC PANEL(CRET KIN+CKTOT+MB+TROPI)  URINALYSIS, ROUTINE W REFLEX MICROSCOPIC   Dg Abd Acute W/chest  04/25/2011  *RADIOLOGY REPORT*  Clinical Data: Chest and abdominal pain.  ACUTE ABDOMEN SERIES (ABDOMEN 2 VIEW & CHEST 1 VIEW)  Comparison: Abdominal film 06/22/2010.  Findings: The upright chest x-ray demonstrates a severe thoracic scoliosis.  The cardiac silhouette, mediastinal and hilar contours are within normal limits.  The lungs are clear.  Two views of the abdomen demonstrate scattered air in the colon and small bowel.  No findings to suggest obstruction or perforation. The bony  structures are stable.  IMPRESSION:  1.  No acute cardiopulmonary findings. 2.  No plain film findings for an acute abdominal process.  Original Report Authenticated By: P. Loralie Champagne, M.D.   ED MEDICATIONS Medications  sodium chloride 0.9 % bolus 500 mL (500 mL Intravenous Given 04/25/11 1748)  0.9 %  sodium chloride infusion    17:25 EDP discussed with PT plan run blood work and xray chest.  9:55 PM Pt recheck. Discussed test results are fairly unremarkable. Pt reports some improvement but will still like to stay in hospital. Plan to consult with Dr. Renard Matter.  No diagnosis found.  Date: 04/25/2011  Rate: 76  Rhythm: normal sinus rhythm  QRS Axis: normal  Intervals: normal  ST/T Wave abnormalities: normal  Conduction Disutrbances:left anterior fascicular block  Narrative Interpretation:   Old EKG Reviewed: changes noted    MDM  Patient is nontoxic but unable to care for self. Diagnostic studies unremarkable. Will admit   I personally performed the services described in this documentation, which was scribed in my presence. The recorded information has been reviewed and considered.         Donnetta Hutching, MD 04/25/11 2223

## 2011-04-26 ENCOUNTER — Encounter (HOSPITAL_COMMUNITY): Payer: Self-pay | Admitting: *Deleted

## 2011-04-26 NOTE — Progress Notes (Signed)
NAMEROBECCA, FULGHAM             ACCOUNT NO.:  [id number]  MEDICAL RECORD NO.:  [id number]  LOCATION:  A326                          FACILITY:  [facility]  PHYSICIAN:  Keilyn Haggard G. Kota Ciancio, MD   DATE OF BIRTH:  [birth year]  DATE OF PROCEDURE: DATE OF DISCHARGE:                                PROGRESS NOTE   This patient was admitted with pain over left lower ribcage in left upper quadrant and left lower quadrant.  She has recently had a partial small bowel obstruction which seems to be resolved, although she has not had a bowel movement in the last couple of days.  OBJECTIVE:  VITAL SIGNS:  Blood pressure 95/55, respirations 16, pulse 64, temp 98.1. HEART:  Regular rhythm. LUNGS:  Clear to P and A.  The patient is slightly tender over the left lower rib cage. ABDOMEN:  Soft.  No palpable organs or masses.  LABORATORY DATA:  Her hemoglobin is 11.9, hematocrit 35.9, and white blood count 9.9. UA was essentially normal. Chemistries all within normal range. CK, CK-MB, and troponin within normal limits.  ASSESSMENT:  The patient does have a recent history of having partial small bowel obstruction.  She was readmitted with pain over the left lower rib cage which was moderately severe.  She does have severe kyphoscoliosis.  PLAN:  To continue her current regimen.     Yunus Stoklosa G. Renard Matter, MD     AGM/MEDQ  D:  04/26/2011  T:  04/26/2011  Job:  [id number]

## 2011-04-26 NOTE — H&P (Signed)
NAMELUCCA, GREGGS             ACCOUNT NO.:  [id number]  MEDICAL RECORD NO.:  [id number]  LOCATION:  A326                          FACILITY:  [facility]  PHYSICIAN:  Tsuyako Jolley G. Renard Matter, MD   DATE OF BIRTH:  Jul 28, 1918  DATE OF ADMISSION:  04/25/2011 DATE OF DISCHARGE:  LH                             HISTORY & PHYSICAL   This 77 year old white female presented to the emergency department complaining of left upper and left lower rib pain.  She has increased gas.  She had been previously seen by PCP early in the week with lower abdominal pain.  A CT scan had been done, which showed possible early small-bowel obstruction.  She had been placed on a liquid diet and these symptoms seemed to improve, although her bowels have not been moving. Her repeat KUB today.  It did not show evidence of small bowel obstruction.  The patient was seen by ED physician.  She apparently lives alone and was unable to take care of herself.  She has no family currently in the area.  She was started on intravenous fluids and subsequently was admitted.  SOCIAL HISTORY:  The patient was a former cigarette smoker.  Does not use alcohol.  FAMILY HISTORY:  Not available.  PAST HISTORY:  The patient has prior history of cancer of uterus.  She does have chronic scoliosis.  PRIOR SURGICAL HISTORY:  The patient has prior history of having had cholecystectomy and joint replacement.  REVIEW OF SYSTEMS:  HEENT:  Negative.  Cardiopulmonary:  No cough, hemoptysis, or dyspnea.  GI:  The patient does not have a bowel movement for several days.  She has had some lower abdominal pain intermittently with mainly left lower quadrant and left upper quadrant.  GU:  No dysuria or hematuria.  ALLERGIES:  To ASPIRIN.  HOME MEDICATIONS: 1. Oxycodone. 2. Acetaminophen 7.5/325 1 every 4 hours as needed. 3. Zofran 4 mg every 6 hours as needed.  PHYSICAL EXAMINATION:  GENERAL/VITAL SIGNS:  Alert elderly female patient with blood  pressure 133/66, pulse 70, temp 98.6. HEENT:  Eyes, PERRLA.  TMs negative.  Oropharynx benign. NECK:  Supple.  No JVD or thyroid abnormalities. HEART:  Regular rhythm.  No murmurs. LUNGS:  Clear to P and A. ABDOMEN:  No palpable organs or masses.  Minimal tenderness in left lower quadrant. SPINE:  The patient has kyphoscoliosis. NEUROLOGICAL:  The patient is alert to person, place, and time.  Does not have motor or sensory abnormalities. SKIN:  Warm and dry.  ASSESSMENT:  The patient was admitted with pain over the left lower rib cage.  She does have a recent history of having had partial small-bowel obstruction, which seems to be resolved.  She does have severe kyphoscoliosis and has difficulty ambulating.  PLAN:  To hydrate the patient.  Continue to monitor her for bowel dysfunction, possible early small-bowel obstruction.     Shiheem Corporan G. Renard Matter, MD     AGM/MEDQ  D:  04/26/2011  T:  04/26/2011  Job:  [id number]

## 2011-04-27 MED ORDER — ACETAMINOPHEN 500 MG PO TABS
500.0000 mg | ORAL_TABLET | ORAL | Status: DC | PRN
  Administered 2011-04-27 – 2011-04-29 (×4): 500 mg via ORAL
  Filled 2011-04-27 (×4): qty 1

## 2011-04-27 MED ORDER — BISACODYL 5 MG PO TBEC
10.0000 mg | DELAYED_RELEASE_TABLET | Freq: Every day | ORAL | Status: DC | PRN
  Administered 2011-04-27 – 2011-04-28 (×2): 10 mg via ORAL
  Filled 2011-04-27 (×2): qty 2

## 2011-04-27 MED ORDER — FLEET ENEMA 7-19 GM/118ML RE ENEM: 1.0000 | ENEMA | Freq: Every day | RECTAL | Status: DC | PRN

## 2011-04-27 NOTE — Progress Notes (Signed)
[name], [name]             ACCOUNT NO.:  [id number]  MEDICAL RECORD NO.:  [id number]  LOCATION:  A326                          FACILITY:  [facility]  PHYSICIAN:  Davisha Linthicum G. Renard Matter, MD   DATE OF BIRTH:  1919-03-02  DATE OF PROCEDURE: DATE OF DISCHARGE:                                PROGRESS NOTE   This patient was admitted with pain over the left lower rib cage and left lower quadrant.  She recently had a partial small bowel obstruction, which seems to be resolved, although she has not had a bowel movement for about 4 days.  OBJECTIVE:  VITAL SIGNS:  Blood pressure 105/58, respirations 18, pulse 69, temp 98. HEART:  Regular rhythm. LUNGS:  Clear to  and A. ABDOMEN:  Slightly distended.  Soft.  No palpable organs or masses.  ASSESSMENT:  The patient does have a recent history of having partial small-bowel obstruction.  She readmitted to the hospital with pain over left lower rib cage.  She does have severe kyphoscoliosis.  PLAN:  To ambulate the patient today.  We will give enema if needed.     Jaleen Finch G. Renard Matter, MD     AGM/MEDQ  D:  04/27/2011  T:  04/27/2011  Job:  [id number]

## 2011-04-28 LAB — CBC
Hemoglobin: 11.2 g/dL — ABNORMAL LOW (ref 12.0–15.0)
MCHC: 33.8 g/dL (ref 30.0–36.0)
Platelets: 222 10*3/uL (ref 150–400)
RDW: 13 % (ref 11.5–15.5)

## 2011-04-28 LAB — INFLUENZA PANEL BY PCR (TYPE A & B)
H1N1 flu by pcr: NOT DETECTED
Influenza A By PCR: NEGATIVE
Influenza B By PCR: NEGATIVE

## 2011-04-28 LAB — DIFFERENTIAL
Basophils Absolute: 0 10*3/uL (ref 0.0–0.1)
Basophils Relative: 0 % (ref 0–1)
Monocytes Relative: 15 % — ABNORMAL HIGH (ref 3–12)
Neutro Abs: 6.3 10*3/uL (ref 1.7–7.7)
Neutrophils Relative %: 71 % (ref 43–77)

## 2011-04-28 NOTE — Progress Notes (Signed)
Four side rails up per patient request, patient states she is "afraid to fall out of bed." Will continue to monitor.

## 2011-04-28 NOTE — Discharge Summary (Signed)
[name], [name]             ACCOUNT NO.:  [id number]  MEDICAL RECORD NO.:  [id number]  LOCATION:  A326                          FACILITY:  [facility]  PHYSICIAN:  Candelaria Pies G. Renard Matter, MD   DATE OF BIRTH:  07-21-1918  DATE OF ADMISSION:  04/25/2011 DATE OF DISCHARGE:  12/17/2012LH                              DISCHARGE SUMMARY   DIAGNOSES:  Partial small-bowel obstruction.  Left upper quadrant pain. Left lower ribcage pain.  Severe kyphoscoliosis.  This 77 year old white female presented to the emergency department complaining of left upper quadrant, left lower rib cage pain and increased gas.  She had previously been seen by PCP earlier in the week with lower abdominal pain.  A CT of the abdomen was obtained showed possible early small bowel obstruction.  She had been placed on a liquid diet.  The symptoms seem to improve.  All of her bowels have not been moving.  A repeat KUB was done.  It did not show evidence of small bowel obstruction.  The patient was seen by ED physician.  Apparently, she lives alone, was unable to take care of herself, had no family currently in the area, was started on intravenous fluids and subsequently was admitted.  The patient did have increased abdominal pain at this particular time.  PHYSICAL EXAMINATION:  GENERAL:  Alert, elderly female. VITAL SIGNS:  Blood pressure 133/66, pulse 70, temp 98.6. HEENT:  Eyes, PERRLA.  TMs negative.  Oropharynx benign. NECK:  Supple.  No JVD or thyroid abnormalities. HEART:  Regular rhythm.  No murmurs.  LUNGS:  Clear to P and A. ABDOMEN:  No palpable organs or masses.  Minimal tenderness in left lower quadrant and over left lower rib cage. SPINE:  The patient has severe kyphoscoliosis.  LABORATORY DATA:  CBC; WBC 9900 with hemoglobin 11.9, hematocrit 35.9. Chemistries on admission; sodium 138, potassium 4.1, chloride 102, CO2 28, BUN 7, creatinine 0.57, glucose 96, alkaline phosphatase 73. Albumin 3.7, lipase 26.   SGOT 17, SGPT 22.  CK-MB on admission was 2. CK 38, troponin less than 0.30 .  X-RAYS:  Acute abdominal series done shortly prior to admission showed no acute cardiopulmonary findings.  No plain findings suggestive of an acute abdominal process.  HOSPITAL COURSE:  The patient at the time of her admission was placed on intravenous fluids, saline. She was given Zofran 4 mg p.r.n. for nausea, Percocet 5/325 q.4 h. p.r.n. for pain, and started on a dysphagia diet. She did have low-grade fever 100.3 and did not have bowel movement for several days.  Needed Dulcolax for constipation.  She did have difficulty ambulating initially, but did get up some with help on last hospital day.  It was felt that the small bowel obstruction had resolved but she does have a low grade fever today and her discharge will be delayed because of low grade fevers.  We will obtain chest x-ray, CBC, UA for further evaluation and we will check for influenza.     Ashya Nicolaisen G. Renard Matter, MD     AGM/MEDQ  D:  04/28/2011  T:  04/28/2011  Job:  [id number]

## 2011-04-28 NOTE — Progress Notes (Signed)
CARE MANAGEMENT NOTE 04/28/2011  Patient:  LAURISSA, COWPER   Account Number:  [id number]  Date Initiated:  04/28/2011  Documentation initiated by:  Rosemary Holms  Subjective/Objective Assessment:   Pt admitted with abdominal pain and febrile. PTA live alone at home independent ADLs     Action/Plan:   Spoke to patient who was concerned that her daugher was concerned about her going home w/out assistance. Spoke with daughter Malachi Bonds, [phone]) and have set up [facility] for [facility] RN,PT,Aide. Also faxed a list of sitter to daugher in [place]   Anticipated DC Date:  04/29/2011   Anticipated DC Plan:  HOME W HOME HEALTH SERVICES      DC Planning Services  CM consult      Choice offered to / List presented to:          [facility] arranged  HH-1 RN  HH-2 PT  HH-4 NURSE'S AIDE      HH agency  [facility]   Status of service:  In process, will continue to follow Medicare Important Message given?   (If response is "NO", the following Medicare IM given date fields will be blank) Date Medicare IM given:   Date Additional Medicare IM given:    Discharge Disposition:    Per UR Regulation:    Comments:  04/28/11 1400 Krystian Ferrentino RN BSN CM [phone]

## 2011-04-28 NOTE — Progress Notes (Signed)
UR Chart Review Completed  

## 2011-04-29 NOTE — Progress Notes (Signed)
NAMEJEWELIANNA, PANCOAST             ACCOUNT NO.:  [id number]  MEDICAL RECORD NO.:  [id number]  LOCATION:  A326                          FACILITY:  [facility]  PHYSICIAN:  Rey Fors G. Ferrah Panagopoulos, MD   DATE OF BIRTH:  07-18-1918  DATE OF PROCEDURE: DATE OF DISCHARGE:                                PROGRESS NOTE   This patient was admitted to hospital with partial small bowel obstruction, left upper quadrant pain, left lower rib cage pain, and severe kyphoscoliosis.  The small bowel obstruction seems to be resolved.  The patient did have a bowel movement.  She was set up for discharge yesterday, but began running fever, and still has a low-grade fever, this morning 100.9, with other vital signs showing a blood pressure of 115/63, respirations 20, pulse 74.  The patient did have a chest x-ray done which showed no acute cardiopulmonary findings.  A CBC, white count 8900, hemoglobin 11.2, hematocrit 33.1.  Influenza A by PCR was negative.  PHYSICAL EXAMINATION:  HEART:  Regular rhythm. LUNGS:  Clear to P and A. ABDOMEN:  Soft, nontender.  ASSESSMENT:  Small bowel obstruction which seems to be resolved, but she continues to run low-grade fever of 100 degrees.  PLANS:  To continue to monitor fever.  We will obtain blood cultures. Continue to attempt gradual ambulation.     Malaisha Silliman G. Renard Matter, MD     AGM/MEDQ  D:  04/29/2011  T:  04/29/2011  Job:  [id number]

## 2011-04-29 NOTE — Consult Note (Signed)
[name], [name]             ACCOUNT NO.:  [id number]  MEDICAL RECORD NO.:  [id number]  LOCATION:  A326                          FACILITY:  [facility]  PHYSICIAN:  [name], M.D.    DATE OF BIRTH:  January 01, 1919  DATE OF CONSULTATION:  04/29/2011 DATE OF DISCHARGE:                                CONSULTATION   REASON FOR CONSULTATION:  Left upper quadrant abdominal pain, nausea, and heaving last night.  HISTORY OF PRESENT ILLNESS:  [name] is 77 year old Caucasian female who presented to [name] over a week ago with left upper quadrant abdominal pain and gurgling or gassy feeling in her abdomen. She had abdominopelvic CT on [name] 6, 2012, which revealed mildly dilated loops of small bowel without transition.  She was felt to have partial small bowel obstruction and/or ileus.  She was begun on clear liquids.  She came to emergency room on the evening of [name] 14, 2012, with worsening pain and she was not able to care for herself.  The patient was hospitalized.  Her acute abdominal series is unremarkable. Patient improved with therapy.  [name] was planning to discharge her this morning, but she had nausea and heaving last night and also had low-grade fever and therefore discharge was postponed.  The patient feels better this morning.  She has been able to tolerate liquid diet.  She states she has been having mild pain off and on for about a year.  She stated she has been very active packing and unpacking boxes and moving stuff and she wonders if that is what has caused this pain.  However, the pain that she had few days ago was associated with lot of gassy feeling and rumbling, but she did not have any nausea, vomiting, fever, or chills at that time.  Her bowel has been regular without melena or rectal bleeding, but she states yesterday her stools were dark.  She has occasional heartburn.  She states her appetite is not good, and she has lost close to 10 pounds  over the last couple of months.  She denies dysphagia.  She has occasional heartburn.  She has not been recently screened for CRC.  She also denies dysuria or hematuria.  Her CAT scan from [name] 6, 2012, also suggests a stone possibly in the right distal ureter.  At home, she has been on Percocet 7.5/325 one tablet q.4 p.r.n. and Zofran 4 mg p.o. q.6h p.r.n.  CURRENT MEDICATIONS:  Enoxaparin 30 mg subcu q.12 hours.  P.r.n. medications include: 1. Bisacodyl 10 mg daily p.r.n. 2. Zofran 4 mg p.o. q.6h p.r.n. 3. Oxycodone/acetaminophen 5/325 one q.6h p.r.n. 4. Fleet enema 1 daily p.r.n. 5. Tylenol 500 mg p.o. q.4 p.r.n.  PAST MEDICAL HISTORY:  Severe kyphoscoliosis.  She believes that this condition has gotten worse over the years.  She believes that having slightly shorter right leg made it worse.  She had BSO with hysterectomy with uterine cancer in 1985 and she has remained in remission.  She had right hip surgery in 1985 and redo in January 2002.  She had right knee replacement in 2000.  She has had bilateral carpal tunnel decompression left one in March 2005 and  right one in March 2010.  She has history of kidney stones and she had cysto with retrograde pyelography and holmium lithotripsy of stone in right ureter in November 2003.  She has had cholecystectomy several years ago.  She has macular degeneration and she has had bilateral cataract extraction.  ALLERGIES:  To ASPIRIN.  FAMILY HISTORY:  Noncontributory.  One sibling died at very early age.  SOCIAL HISTORY:  She lost her husband several years ago carcinoma at age 31.  She has been living alone.  She has 2 daughters and they both live far away, one is in [place], the other one is in [place].  The patient has been driving under this admission.  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  She has never drank alcohol.  She smoked cigarettes for 3-4 a day for 5 years in her early 55s, but quit long time ago.  PHYSICAL  EXAMINATION:  GENERAL:  Pleasant, well-developed, thin Caucasian female with some hearing impairment. VITAL SIGNS:  Admission weight 104 pounds, she is 56 inches tall, pulse 98 per minute, blood pressure 100/57, respirations 20, and temp 98. Earlier, her temp had been 100.9. HEENT:  Conjunctivae is pink.  Sclera is nonicteric.  Oropharyngeal mucosa is normal.  Few teeth are missing, rest of the dentition in satisfactory condition.  No neck masses or thyromegaly noted.  She has marked kyphoscoliosis with convexity to the right.  No neck masses or thyromegaly noted. CARDIAC:  With regular rhythm.  Normal S1, S2.  No murmur or gallop noted. LUNGS : Clear to auscultation. ABDOMEN:  Asymmetric with left subcostal margin at a lower level than the right costal margin.  Bowel sounds are hyperactive.  Abdomen is soft and nontender without organomegaly or masses. RECTAL:  Reveals scant amount of stool in the wall which is guaiac negative.  No peripheral edema or clubbing noted.  LABORATORY DATA:  From admission, WBC 9.9, H and H 11.9 and 35.9, platelet count 28,000.  Urinalysis revealed specific gravity of 10/10, ketones at 15 mg/dL and UA had 0-2 wbc's and 0-2 rbc's.  Sodium was 138, potassium 4.1, chloride 102, CO2 of 28, glucose 98, BUN 7, creatinine 0.57, calcium 9.8, bilirubin was 0.7 AP 73, AST 17, ALT 22, total protein 6.8 with albumin of 3.7.  CBC from [name] 17, 2012, WBC 8.9, H and H 11.2 and 33.1.  Imaging studies including abdominopelvic CT from [name] 6, 2012, and acute abdominal series from this admission reviewed with Dr. Ulyses Southward. She has mild diffuse small bowel distention without transition and contrast is in the right colon which is also not dilated.  Some fluid in the rectum.  No inflammatory changes noted to small or large intestine. There is evidence of prior hysterectomy and perhaps no dissection because of location of clips.  She has severe thoracolumbar scoliosis  8 mm nonobstructing calculus  possibly in the right distal ureter or UP junction, but no hydronephrosis noted.  There is 10 mm cyst in the right lobe of liver and evidence of right hip arthroplasty.  Bile duct is 8 mm, but no evidence of choledocholithiasis.  ASSESSMENT:  As far as patient's acute symptoms are concerned, she may have had an element of gastroenteritis although her symptoms are not typical.  In addition, the pain she also had lot of gurgling and gassy feeling, and these symptoms have resolved.  It is also possible that her pain is musculoskeletal due to severe thoracolumbar scoliosis and she may have gotten sick  with narcotic that she was taking at home.  She has stone possibly in the right distal ureter or UPJ but this does not appear to be causing any symptoms.  She has had a cystoscopy about 9 years ago.  She does not appear to be impacted.  Her stool is guaiac negative.  If her symptoms relapsed we may consider further evaluation by repeating her CT.  RECOMMENDATIONS:  Hemoccult x1.  We will advance the diet to full liquid diet and if she does well, diet could be advanced to regular diet in a.m.  If she spikes again, would consider blood cultures and urine cultures.  We appreciate the opportunity to participate in the care of this nice lady.          ______________________________ [name], M.D.     NR/MEDQ  D:  04/29/2011  T:  04/29/2011  Job:  [id number]

## 2011-04-29 NOTE — Progress Notes (Signed)
UR Chart Review Completed  

## 2011-04-29 NOTE — Consult Note (Signed)
Note dictated

## 2011-04-30 NOTE — Plan of Care (Signed)
Problem: Discharge Progression Outcomes Goal: Other Discharge Outcomes/Goals Outcome: Completed/Met Date Met:  04/30/11 Home Health to follow up with patient.

## 2011-04-30 NOTE — Discharge Summary (Signed)
[name], Martha             ACCOUNT NO.:  [id number]  MEDICAL RECORD NO.:  [id number]  LOCATION:  A326                          FACILITY:  [facility]  PHYSICIAN:  Kalijah Zeiss G. Renard Matter, MD   DATE OF BIRTH:  1918/05/17  DATE OF ADMISSION:  04/25/2011 DATE OF DISCHARGE:  12/19/2012LH                              DISCHARGE SUMMARY   ADDENDUM  As stated, the patient continued to run fever.  She had a repeat chest x- ray, which showed no acute pulmonary findings, no plain film findings for an acute abdominal process.  The patient had CBC done, which showed a white count of 8.9, hemoglobin 11.2, hematocrit 33.1.  She is checked for influenza type A and B, both were negative.  The patient became afebrile.  Her stool was negative for occult blood.  She was seen in consultation by GI Service, Dr. Karilyn Cota.  He felt she might have had an element of gastroenteritis, although symptoms were not typical.  He also felt the abdominal pain could be musculoskeletal due to severe thoracolumbar scoliosis.  Her diet was advanced, and she had a negative stool for occult blood.  She remains afebrile, and it is felt she could be discharged home.     Yoshi Vicencio G. Renard Matter, MD     AGM/MEDQ  D:  04/30/2011  T:  04/30/2011  Job:  [id number]

## 2011-04-30 NOTE — Progress Notes (Signed)
CARE MANAGEMENT NOTE 04/30/2011  Patient:  [name], [name]   Account Number:  [id number]  Date Initiated:  04/28/2011  Documentation initiated by:  Rosemary Holms  Subjective/Objective Assessment:   Pt admitted with abdominal pain and febrile. PTA live alone at home independent ADLs     Action/Plan:   Spoke to patient who was concerned that her daugher was concerned about her going home w/out assistance. Spoke with daughter Malachi Bonds, [phone]) and have set up [facility] for [facility] RN,PT,Aide. Also faxed a list of sitter to daugher in [place]   Anticipated DC Date:  04/29/2011   Anticipated DC Plan:  HOME W HOME HEALTH SERVICES      DC Planning Services  CM consult      Choice offered to / List presented to:  C-1 Patient        HH arranged  HH-1 RN  HH-2 PT  HH-4 NURSE'S AIDE      HH agency  [facility]   Status of service:  Completed, signed off Medicare Important Message given?   (If response is "NO", the following Medicare IM given date fields will be blank) Date Medicare IM given:   Date Additional Medicare IM given:    Discharge Disposition:  HOME W HOME HEALTH SERVICES  Per UR Regulation:    Comments:  04/30/11 1430 Ketra Duchesne Leanord Hawking RN BSN CM Unable to reach Dr. Renard Matter for [facility] order. Alroy Bailiff with [facility] notified and will call in the am for the order and F2F.  04/28/11 1400 Rosemary Holms RN BSN CM [phone]

## 2011-04-30 NOTE — Progress Notes (Signed)
Discharged instructions given on medications,and follow up visits,patient verbalized understanding.Home Health to follow up with patient.No C/O pain or discomfort noted.Accompanied by staff to an awaiting vehicle.

## 2011-05-01 NOTE — Discharge Summary (Signed)
[name], [name]             ACCOUNT NO.:  [id number]  MEDICAL RECORD NO.:  [id number]  LOCATION:  A326                          FACILITY:  [facility]  PHYSICIAN:  Elam Ellis G. Renard Matter, MD   DATE OF BIRTH:  1918/07/03  DATE OF ADMISSION:  04/25/2011 DATE OF DISCHARGE:  12/19/2012LH                              DISCHARGE SUMMARY   ADDENDUM  This patient's discharge was delayed because she began to run a low- grade fever and had some upper abdominal discomfort.     Edyn Popoca G. Renard Matter, MD     AGM/MEDQ  D:  04/30/2011  T:  04/30/2011  Job:  [id number]

## 2011-09-02 ENCOUNTER — Ambulatory Visit (INDEPENDENT_AMBULATORY_CARE_PROVIDER_SITE_OTHER): Payer: Medicare Other | Admitting: Urology

## 2011-09-02 DIAGNOSIS — N393 Stress incontinence (female) (male): Secondary | ICD-10-CM

## 2011-09-02 DIAGNOSIS — N952 Postmenopausal atrophic vaginitis: Secondary | ICD-10-CM

## 2011-09-02 DIAGNOSIS — Z87440 Personal history of urinary (tract) infections: Secondary | ICD-10-CM

## 2012-03-09 ENCOUNTER — Ambulatory Visit (INDEPENDENT_AMBULATORY_CARE_PROVIDER_SITE_OTHER): Payer: Medicare Other | Admitting: Urology

## 2012-03-09 DIAGNOSIS — N3941 Urge incontinence: Secondary | ICD-10-CM

## 2012-03-09 DIAGNOSIS — R350 Frequency of micturition: Secondary | ICD-10-CM

## 2012-03-09 DIAGNOSIS — N3000 Acute cystitis without hematuria: Secondary | ICD-10-CM

## 2012-03-09 DIAGNOSIS — R3915 Urgency of urination: Secondary | ICD-10-CM

## 2012-05-07 ENCOUNTER — Ambulatory Visit (INDEPENDENT_AMBULATORY_CARE_PROVIDER_SITE_OTHER): Payer: Medicare Other | Admitting: Urology

## 2012-05-07 DIAGNOSIS — N3941 Urge incontinence: Secondary | ICD-10-CM

## 2012-05-07 DIAGNOSIS — Z87440 Personal history of urinary (tract) infections: Secondary | ICD-10-CM

## 2012-05-07 DIAGNOSIS — N200 Calculus of kidney: Secondary | ICD-10-CM

## 2012-05-25 ENCOUNTER — Ambulatory Visit (INDEPENDENT_AMBULATORY_CARE_PROVIDER_SITE_OTHER): Payer: Medicare Other | Admitting: Urology

## 2012-05-25 DIAGNOSIS — R3915 Urgency of urination: Secondary | ICD-10-CM

## 2012-05-25 DIAGNOSIS — N393 Stress incontinence (female) (male): Secondary | ICD-10-CM

## 2012-05-25 DIAGNOSIS — N3000 Acute cystitis without hematuria: Secondary | ICD-10-CM

## 2012-08-24 ENCOUNTER — Encounter (HOSPITAL_COMMUNITY): Payer: Medicare Other | Attending: Rheumatology

## 2012-08-24 VITALS — BP 117/60 | HR 55 | Temp 98.2°F | Resp 16

## 2012-08-24 DIAGNOSIS — M810 Age-related osteoporosis without current pathological fracture: Secondary | ICD-10-CM | POA: Insufficient documentation

## 2012-08-24 MED ORDER — ZOLEDRONIC ACID 5 MG/100ML IV SOLN
5.0000 mg | Freq: Once | INTRAVENOUS | Status: AC
  Administered 2012-08-24: 5 mg via INTRAVENOUS

## 2012-08-24 MED ORDER — SODIUM CHLORIDE 0.9 % IV SOLN
INTRAVENOUS | Status: DC
  Administered 2012-08-24: 12:00:00 via INTRAVENOUS

## 2012-08-24 MED ORDER — SODIUM CHLORIDE 0.9 % IJ SOLN
10.0000 mL | INTRAMUSCULAR | Status: DC | PRN
  Administered 2012-08-24: 10 mL via INTRAVENOUS

## 2012-08-24 NOTE — Progress Notes (Signed)
Tolerated infusion without any complications.

## 2012-09-23 ENCOUNTER — Ambulatory Visit (HOSPITAL_COMMUNITY)
Admission: RE | Admit: 2012-09-23 | Discharge: 2012-09-23 | Disposition: A | Discharge status: Home / Self Care | Payer: Medicare Other | Source: Ambulatory Visit | Attending: Family Medicine | Admitting: Family Medicine

## 2012-09-23 ENCOUNTER — Other Ambulatory Visit (HOSPITAL_COMMUNITY): Payer: Self-pay | Admitting: Family Medicine

## 2012-09-23 DIAGNOSIS — R109 Unspecified abdominal pain: Secondary | ICD-10-CM | POA: Insufficient documentation

## 2012-09-23 DIAGNOSIS — R110 Nausea: Secondary | ICD-10-CM | POA: Insufficient documentation

## 2012-12-01 ENCOUNTER — Ambulatory Visit (HOSPITAL_COMMUNITY)
Admission: RE | Admit: 2012-12-01 | Discharge: 2012-12-01 | Disposition: A | Discharge status: Home / Self Care | Payer: Medicare Other | Source: Ambulatory Visit | Attending: Family Medicine | Admitting: Family Medicine

## 2012-12-01 ENCOUNTER — Other Ambulatory Visit (HOSPITAL_COMMUNITY): Payer: Self-pay | Admitting: Family Medicine

## 2012-12-01 DIAGNOSIS — Y831 Surgical operation with implant of artificial internal device as the cause of abnormal reaction of the patient, or of later complication, without mention of misadventure at the time of the procedure: Secondary | ICD-10-CM | POA: Insufficient documentation

## 2012-12-01 DIAGNOSIS — M5137 Other intervertebral disc degeneration, lumbosacral region: Secondary | ICD-10-CM | POA: Insufficient documentation

## 2012-12-01 DIAGNOSIS — M247 Protrusio acetabuli: Secondary | ICD-10-CM | POA: Insufficient documentation

## 2012-12-01 DIAGNOSIS — Z96649 Presence of unspecified artificial hip joint: Secondary | ICD-10-CM | POA: Insufficient documentation

## 2012-12-01 DIAGNOSIS — T84099A Other mechanical complication of unspecified internal joint prosthesis, initial encounter: Secondary | ICD-10-CM | POA: Insufficient documentation

## 2012-12-01 DIAGNOSIS — M51379 Other intervertebral disc degeneration, lumbosacral region without mention of lumbar back pain or lower extremity pain: Secondary | ICD-10-CM | POA: Insufficient documentation

## 2012-12-01 DIAGNOSIS — M899 Disorder of bone, unspecified: Secondary | ICD-10-CM | POA: Insufficient documentation

## 2012-12-01 DIAGNOSIS — M545 Low back pain: Secondary | ICD-10-CM

## 2012-12-01 DIAGNOSIS — M47817 Spondylosis without myelopathy or radiculopathy, lumbosacral region: Secondary | ICD-10-CM | POA: Insufficient documentation

## 2012-12-01 DIAGNOSIS — M4120 Other idiopathic scoliosis, site unspecified: Secondary | ICD-10-CM | POA: Insufficient documentation

## 2012-12-31 ENCOUNTER — Encounter (HOSPITAL_COMMUNITY): Payer: Self-pay | Admitting: Emergency Medicine

## 2012-12-31 ENCOUNTER — Inpatient Hospital Stay (HOSPITAL_COMMUNITY): Payer: Medicare Other

## 2012-12-31 ENCOUNTER — Inpatient Hospital Stay (HOSPITAL_COMMUNITY)
Admission: EM | Admit: 2012-12-31 | Discharge: 2013-01-07 | DRG: 871 | Disposition: A | Discharge status: Skilled Nursing Facility | Payer: Medicare Other | Attending: Family Medicine | Admitting: Family Medicine

## 2012-12-31 ENCOUNTER — Emergency Department (HOSPITAL_COMMUNITY): Payer: Medicare Other

## 2012-12-31 DIAGNOSIS — K5900 Constipation, unspecified: Secondary | ICD-10-CM | POA: Diagnosis present

## 2012-12-31 DIAGNOSIS — Z87442 Personal history of urinary calculi: Secondary | ICD-10-CM

## 2012-12-31 DIAGNOSIS — Z87891 Personal history of nicotine dependence: Secondary | ICD-10-CM

## 2012-12-31 DIAGNOSIS — N200 Calculus of kidney: Secondary | ICD-10-CM | POA: Diagnosis present

## 2012-12-31 DIAGNOSIS — N390 Urinary tract infection, site not specified: Secondary | ICD-10-CM | POA: Diagnosis present

## 2012-12-31 DIAGNOSIS — E860 Dehydration: Secondary | ICD-10-CM

## 2012-12-31 DIAGNOSIS — R7402 Elevation of levels of lactic acid dehydrogenase (LDH): Secondary | ICD-10-CM | POA: Diagnosis present

## 2012-12-31 DIAGNOSIS — D72823 Leukemoid reaction: Secondary | ICD-10-CM | POA: Diagnosis present

## 2012-12-31 DIAGNOSIS — A0472 Enterocolitis due to Clostridium difficile, not specified as recurrent: Secondary | ICD-10-CM | POA: Diagnosis present

## 2012-12-31 DIAGNOSIS — Z9089 Acquired absence of other organs: Secondary | ICD-10-CM

## 2012-12-31 DIAGNOSIS — K7200 Acute and subacute hepatic failure without coma: Secondary | ICD-10-CM | POA: Diagnosis present

## 2012-12-31 DIAGNOSIS — M4000 Postural kyphosis, site unspecified: Secondary | ICD-10-CM | POA: Diagnosis present

## 2012-12-31 DIAGNOSIS — K8689 Other specified diseases of pancreas: Secondary | ICD-10-CM

## 2012-12-31 DIAGNOSIS — K5100 Ulcerative (chronic) pancolitis without complications: Secondary | ICD-10-CM | POA: Diagnosis present

## 2012-12-31 DIAGNOSIS — E872 Acidosis, unspecified: Secondary | ICD-10-CM | POA: Diagnosis present

## 2012-12-31 DIAGNOSIS — A419 Sepsis, unspecified organism: Principal | ICD-10-CM | POA: Diagnosis present

## 2012-12-31 DIAGNOSIS — I959 Hypotension, unspecified: Secondary | ICD-10-CM | POA: Diagnosis present

## 2012-12-31 DIAGNOSIS — K5289 Other specified noninfective gastroenteritis and colitis: Secondary | ICD-10-CM

## 2012-12-31 DIAGNOSIS — R001 Bradycardia, unspecified: Secondary | ICD-10-CM

## 2012-12-31 DIAGNOSIS — R197 Diarrhea, unspecified: Secondary | ICD-10-CM | POA: Diagnosis present

## 2012-12-31 DIAGNOSIS — R32 Unspecified urinary incontinence: Secondary | ICD-10-CM | POA: Diagnosis present

## 2012-12-31 DIAGNOSIS — IMO0002 Reserved for concepts with insufficient information to code with codable children: Secondary | ICD-10-CM

## 2012-12-31 DIAGNOSIS — R7401 Elevation of levels of liver transaminase levels: Secondary | ICD-10-CM | POA: Diagnosis present

## 2012-12-31 DIAGNOSIS — M129 Arthropathy, unspecified: Secondary | ICD-10-CM | POA: Diagnosis present

## 2012-12-31 DIAGNOSIS — Z8542 Personal history of malignant neoplasm of other parts of uterus: Secondary | ICD-10-CM

## 2012-12-31 DIAGNOSIS — E861 Hypovolemia: Secondary | ICD-10-CM | POA: Diagnosis present

## 2012-12-31 DIAGNOSIS — K529 Noninfective gastroenteritis and colitis, unspecified: Secondary | ICD-10-CM

## 2012-12-31 HISTORY — DX: Noninfective gastroenteritis and colitis, unspecified: K52.9

## 2012-12-31 HISTORY — DX: Other specified diseases of pancreas: K86.89

## 2012-12-31 HISTORY — DX: Unspecified osteoarthritis, unspecified site: M19.90

## 2012-12-31 HISTORY — DX: Calculus of kidney: N20.0

## 2012-12-31 LAB — CBC WITH DIFFERENTIAL/PLATELET
Basophils Relative: 0 % (ref 0–1)
Eosinophils Absolute: 0.1 10*3/uL (ref 0.0–0.7)
HCT: 39.7 % (ref 36.0–46.0)
Hemoglobin: 12.7 g/dL (ref 12.0–15.0)
MCH: 32.2 pg (ref 26.0–34.0)
MCHC: 32 g/dL (ref 30.0–36.0)
Monocytes Absolute: 0.1 10*3/uL (ref 0.1–1.0)
Monocytes Relative: 0 % — ABNORMAL LOW (ref 3–12)
Neutro Abs: 23.1 10*3/uL — ABNORMAL HIGH (ref 1.7–7.7)

## 2012-12-31 LAB — URINALYSIS, ROUTINE W REFLEX MICROSCOPIC
Bilirubin Urine: NEGATIVE
Nitrite: POSITIVE — AB
Specific Gravity, Urine: 1.01 (ref 1.005–1.030)
pH: 6.5 (ref 5.0–8.0)

## 2012-12-31 LAB — COMPREHENSIVE METABOLIC PANEL
ALT: 372 U/L — ABNORMAL HIGH (ref 0–35)
AST: 889 U/L — ABNORMAL HIGH (ref 0–37)
Albumin: 2.9 g/dL — ABNORMAL LOW (ref 3.5–5.2)
Calcium: 8.8 mg/dL (ref 8.4–10.5)
Sodium: 145 mEq/L (ref 135–145)
Total Protein: 5.8 g/dL — ABNORMAL LOW (ref 6.0–8.3)

## 2012-12-31 LAB — LACTIC ACID, PLASMA: Lactic Acid, Venous: 5.5 mmol/L — ABNORMAL HIGH (ref 0.5–2.2)

## 2012-12-31 LAB — URINE MICROSCOPIC-ADD ON

## 2012-12-31 MED ORDER — METRONIDAZOLE IN NACL 5-0.79 MG/ML-% IV SOLN
500.0000 mg | Freq: Three times a day (TID) | INTRAVENOUS | Status: DC
Start: 2012-12-31 — End: 2013-01-07
  Administered 2012-12-31 – 2013-01-07 (×21): 500 mg via INTRAVENOUS
  Filled 2012-12-31 (×30): qty 100

## 2012-12-31 MED ORDER — SODIUM CHLORIDE 0.9 % IV BOLUS (SEPSIS)
1000.0000 mL | Freq: Once | INTRAVENOUS | Status: AC
  Administered 2012-12-31: 1000 mL via INTRAVENOUS

## 2012-12-31 MED ORDER — IOHEXOL 300 MG/ML  SOLN
50.0000 mL | Freq: Once | INTRAMUSCULAR | Status: AC | PRN
  Administered 2012-12-31: 50 mL via ORAL

## 2012-12-31 MED ORDER — ENOXAPARIN SODIUM 30 MG/0.3ML ~~LOC~~ SOLN
30.0000 mg | SUBCUTANEOUS | Status: DC
  Administered 2012-12-31 – 2013-01-02 (×3): 30 mg via SUBCUTANEOUS
  Filled 2012-12-31 (×3): qty 0.3

## 2012-12-31 MED ORDER — ONDANSETRON HCL 4 MG/2ML IJ SOLN
4.0000 mg | Freq: Once | INTRAMUSCULAR | Status: AC
  Administered 2012-12-31: 4 mg via INTRAVENOUS
  Filled 2012-12-31: qty 2

## 2012-12-31 MED ORDER — SODIUM CHLORIDE 0.9 % IV SOLN
1000.0000 mL | Freq: Once | INTRAVENOUS | Status: AC
  Administered 2012-12-31: 1000 mL via INTRAVENOUS

## 2012-12-31 MED ORDER — SODIUM CHLORIDE 0.9 % IJ SOLN
10.0000 mL | Freq: Two times a day (BID) | INTRAMUSCULAR | Status: DC
  Administered 2013-01-01 – 2013-01-03 (×4): 10 mL
  Administered 2013-01-03: 20 mL
  Administered 2013-01-04 – 2013-01-05 (×3): 10 mL
  Administered 2013-01-06: 20 mL
  Administered 2013-01-06 (×2): 10 mL
  Administered 2013-01-07: 30 mL

## 2012-12-31 MED ORDER — CIPROFLOXACIN IN D5W 400 MG/200ML IV SOLN
400.0000 mg | Freq: Two times a day (BID) | INTRAVENOUS | Status: DC
  Filled 2012-12-31 (×6): qty 200

## 2012-12-31 MED ORDER — IOHEXOL 300 MG/ML  SOLN
100.0000 mL | Freq: Once | INTRAMUSCULAR | Status: AC | PRN
  Administered 2012-12-31: 100 mL via INTRAVENOUS

## 2012-12-31 MED ORDER — KCL IN DEXTROSE-NACL 20-5-0.9 MEQ/L-%-% IV SOLN
INTRAVENOUS | Status: DC
  Administered 2012-12-31 – 2013-01-01 (×2): via INTRAVENOUS

## 2012-12-31 MED ORDER — PIPERACILLIN-TAZOBACTAM 3.375 G IVPB
3.3750 g | Freq: Once | INTRAVENOUS | Status: AC
  Administered 2012-12-31: 3.375 g via INTRAVENOUS
  Filled 2012-12-31 (×2): qty 50

## 2012-12-31 MED ORDER — METRONIDAZOLE IN NACL 5-0.79 MG/ML-% IV SOLN
INTRAVENOUS | Status: AC
  Filled 2012-12-31: qty 100

## 2012-12-31 MED ORDER — SODIUM CHLORIDE 0.9 % IV SOLN
1000.0000 mL | INTRAVENOUS | Status: DC
  Administered 2012-12-31: 1000 mL via INTRAVENOUS

## 2012-12-31 MED ORDER — SODIUM CHLORIDE 0.9 % IV BOLUS (SEPSIS)
500.0000 mL | Freq: Once | INTRAVENOUS | Status: AC
  Administered 2012-12-31: 500 mL via INTRAVENOUS

## 2012-12-31 MED ORDER — ACETAMINOPHEN 325 MG PO TABS
650.0000 mg | ORAL_TABLET | Freq: Four times a day (QID) | ORAL | Status: DC | PRN
  Administered 2012-12-31 – 2013-01-04 (×7): 650 mg via ORAL
  Filled 2012-12-31 (×7): qty 2

## 2012-12-31 MED ORDER — SODIUM CHLORIDE 0.9 % IJ SOLN
10.0000 mL | INTRAMUSCULAR | Status: DC | PRN
  Administered 2013-01-06: 10 mL

## 2012-12-31 MED ORDER — METRONIDAZOLE IN NACL 5-0.79 MG/ML-% IV SOLN
500.0000 mg | Freq: Three times a day (TID) | INTRAVENOUS | Status: DC
  Filled 2012-12-31 (×8): qty 100

## 2012-12-31 MED ORDER — HYDROCORTISONE SOD SUCCINATE 100 MG IJ SOLR
100.0000 mg | Freq: Four times a day (QID) | INTRAMUSCULAR | Status: DC
  Administered 2012-12-31: 100 mg via INTRAVENOUS
  Administered 2013-01-01 (×2): via INTRAVENOUS
  Administered 2013-01-01 – 2013-01-02 (×6): 100 mg via INTRAVENOUS
  Administered 2013-01-02: 11:00:00 via INTRAVENOUS
  Administered 2013-01-03 – 2013-01-04 (×7): 100 mg via INTRAVENOUS
  Administered 2013-01-05: via INTRAVENOUS
  Administered 2013-01-05 – 2013-01-07 (×9): 100 mg via INTRAVENOUS
  Filled 2012-12-31 (×28): qty 2

## 2012-12-31 MED ORDER — MORPHINE SULFATE 2 MG/ML IJ SOLN
2.0000 mg | INTRAMUSCULAR | Status: DC | PRN
  Administered 2013-01-05 (×2): 2 mg via INTRAVENOUS
  Filled 2012-12-31 (×3): qty 1

## 2012-12-31 MED ORDER — POTASSIUM CHLORIDE IN NACL 20-0.9 MEQ/L-% IV SOLN
INTRAVENOUS | Status: DC
  Administered 2012-12-31 – 2013-01-01 (×4): via INTRAVENOUS
  Administered 2013-01-03: 1000 mL via INTRAVENOUS
  Administered 2013-01-05 – 2013-01-06 (×2): via INTRAVENOUS

## 2012-12-31 MED ORDER — ALBUTEROL SULFATE (5 MG/ML) 0.5% IN NEBU
2.5000 mg | INHALATION_SOLUTION | RESPIRATORY_TRACT | Status: DC | PRN
  Administered 2013-01-03 (×3): 2.5 mg via RESPIRATORY_TRACT
  Filled 2012-12-31 (×3): qty 0.5

## 2012-12-31 MED ORDER — HYDROCORTISONE SOD SUCCINATE 100 MG IJ SOLR: 50.0000 mg | Freq: Four times a day (QID) | INTRAMUSCULAR | Status: DC

## 2012-12-31 MED ORDER — ACETAMINOPHEN 650 MG RE SUPP: 650.0000 mg | Freq: Four times a day (QID) | RECTAL | Status: DC | PRN

## 2012-12-31 MED ORDER — CIPROFLOXACIN IN D5W 200 MG/100ML IV SOLN
200.0000 mg | Freq: Two times a day (BID) | INTRAVENOUS | Status: DC
  Administered 2012-12-31 – 2013-01-04 (×8): 200 mg via INTRAVENOUS
  Filled 2012-12-31 (×10): qty 100

## 2012-12-31 NOTE — ED Provider Notes (Signed)
CSN: [id number]     Arrival date & time 12/31/12  1107 History    This chart was scribed for Joya Gaskins, MD by Blanchard Kelch, ED Scribe. The patient was seen in room APA05/APA05. Patient's care was started at 11:23 AM.    Chief Complaint  Patient presents with  . Abdominal Pain  . Diarrhea    Patient is a 79 y.o. female presenting with abdominal pain and diarrhea. The history is provided by the patient. No language interpreter was used.  Abdominal Pain Pain location:  Generalized Pain radiates to:  Back Pain severity:  Severe Onset quality:  Sudden Duration:  6 hours Timing:  Constant Progression:  Improving Associated symptoms: diarrhea, nausea and vomiting   Associated symptoms: no chest pain, no hematochezia, no melena and no shortness of breath   Risk factors: being elderly   Diarrhea Associated symptoms: abdominal pain and vomiting     HPI Comments: [name] is a 79 y.o. female brought in by ambulance, who presents to the Emergency Department complaining of generalized, constant, improving abdominal pain that radiates to the back and began about six hours ago upon waking. She complains of associated nausea, dry emesis and diarrhea. Patient denies eating anything unusual other than ground [place] last night. Patient denies hematochezia, melena, chest pain or shortness of breath. Patient reports current kidney stone.    Past Medical History  Diagnosis Date  . Cancer     uterine surg only  . Arthritis   . Kidney stone    Past Surgical History  Procedure Laterality Date  . Joint replacement    . Cholecystectomy    . Right hip surgery    . Left knee surgery and revision     Family History  Problem Relation Age of Onset  . Cancer Mother    History  Substance Use Topics  . Smoking status: Former Smoker    Types: Cigarettes    Quit date: 04/25/1951  . Smokeless tobacco: Never Used  . Alcohol Use: 0.6 oz/week    1 Glasses of wine per week   Comment: 1 glass of wine a day.   OB History   Grav Para Term Preterm Abortions TAB SAB Ect Mult Living   3 2 2  1  1         Review of Systems  Respiratory: Negative for shortness of breath.   Cardiovascular: Negative for chest pain.  Gastrointestinal: Positive for nausea, vomiting, abdominal pain and diarrhea. Negative for blood in stool, melena and hematochezia.  All other systems reviewed and are negative.    Allergies  Sulfa antibiotics and Aspirin  Home Medications   Current Outpatient Rx  Name  Route  Sig  Dispense  Refill  . ondansetron (ZOFRAN) 4 MG tablet   Oral   Take 4 mg by mouth every 6 (six) hours as needed. For nausea           Triage Vitals: BP 132/77  Pulse 90  Temp(Src) 97.8 F (36.6 C) (Oral)  Ht 4\' 6"  (1.372 m)  Wt 97 lb (43.999 kg)  BMI 23.37 kg/m2  SpO2 98%  Physical Exam  CONSTITUTIONAL: Well developed/well nourished HEAD: Normocephalic/atraumatic EYES: EOMI/PERRL ENMT: Mucous membranes dry NECK: supple no meningeal signs CV: S1/S2 noted, no murmurs/rubs/gallops noted LUNGS: Lungs are clear to auscultation bilaterally, no apparent distress ABDOMEN: soft, diffuse mild tenderness, no rebound or guarding GU:no cva tenderness Rectal - stool without blood or melena, chaperone present NEURO: Pt is awake/alert, moves  all extremitiesx4 EXTREMITIES: pulses normal, full ROM SKIN: warm, color normal PSYCH: no abnormalities of mood noted   ED Course   DIAGNOSTIC STUDIES:  Oxygen Saturation is 98% on room air, normal by my interpretation.    COORDINATION OF CARE:  11:29 AM - Patient verbalizes understanding and agrees with treatment plan. 12:42 PM Pt is feeling worse with diffuse abdominal pain Her repeat BP is 85 Her lactate is >5 Will need Ct imaging IV fluids ordered Zosyn ordered for antibiotics Code sepsis called 3:54 PM I spoke to dr ziegler concerning CT findings.  There is no acute surgical need, but does have colitis as well  as uti.  He will follow in consult, but recommends medicine admit D/w dr Sherrie Mustache will admit.   Currently her SBP >90 by manual check She is responding to IV fluids Suspect lactate will improve with IV fluids resuscitation She has been given up to 3L normal saline as well as zosyn (was given empirically for possible sepsis)   Procedures  CRITICAL CARE Performed by: Joya Gaskins Total critical care time: 45 Critical care time was exclusive of separately billable procedures and treating other patients. Critical care was necessary to treat or prevent imminent or life-threatening deterioration. Critical care was time spent personally by me on the following activities: development of treatment plan with patient and/or surrogate as well as nursing, discussions with consultants, evaluation of patient's response to treatment, examination of patient, obtaining history from patient or surrogate, ordering and performing treatments and interventions, ordering and review of laboratory studies, ordering and review of radiographic studies, pulse oximetry and re-evaluation of patient's condition.   MDM  Nursing notes including past medical history and social history reviewed and considered in documentation Labs/vital reviewed and considered xrays reviewed and considered      Date: 12/31/2012  Rate: 81  Rhythm: normal sinus rhythm  QRS Axis: left  Intervals: normal  ST/T Wave abnormalities: nonspecific T wave changes  Conduction Disutrbances:none  Narrative Interpretation:   Old EKG Reviewed: unchanged from prior    I personally performed the services described in this documentation, which was scribed in my presence. The recorded information has been reviewed and is accurate.        Joya Gaskins, MD 12/31/12 [phone]

## 2012-12-31 NOTE — ED Notes (Signed)
EDP aware of pt v/s. No new orders given at this time.

## 2012-12-31 NOTE — ED Notes (Signed)
POC occult blood obtained by EDP and had a negative result.

## 2012-12-31 NOTE — ED Notes (Addendum)
Per EMS, pt reports diarrhea since last night. Pt reports onset was after eating a [place] burger. Pt alert and oriented. Per EMS pt received 4mg  of zofran and 300cc of normal saline. CBG 114.nad noted. Airway patent. Pt reports she has a known kidney stone.

## 2012-12-31 NOTE — H&P (Addendum)
[facility] History and Physical  [name] FAO:[id number] DOB: 06/21/1918 DOA: 12/31/2012  Referring physician: ED physician, Dr. Bebe Shaggy. PCP: Alice Reichert, MD  Specialists:   Chief Complaint: Abdominal pain and diarrhea.  HPI: [name] is a 79 y.o. female with a history of uterine cancer, kidney stones, and arthritis, who presents to the emergency department with a complaint of abdominal pain and diarrhea. She also had one episode of vomiting. She started feeling "bad" a few days ago. However, this morning at approximately 5 AM, she developed right lower quadrant abdominal pain which radiated to her epigastrium and into her back. This was followed by a large amount of diarrhea x1. Her pain is moderate in intensity. It has been intermittent. She describes the pain as a "hard bulging" pain. She denies black tarry stools or grossly bloody stools. She denies coffee grounds emesis. She denies pain with urination, but admits to urinary incontinence. She has felt lightheaded. She has had intermittent shortness of breath. She denies headache and chest pain. She denies recent antibiotic use. She denies recent travel. She made [place] burgers yesterday, but has not eaten out over the past couple days.  In the emergency department, she was initially hypotensive with a blood pressure 77/52. With several liters of IV fluids, her blood pressure improved to the 120s systolically. She is mildly tachycardic. She is oxygenating 98% on room air. Her chest x-ray reveals a suspicion of venous hypertension. CT of her abdomen reveals diffuse colitis which spares the cecum, right renal pelvis 1 cm calculus, chronic mild dilatation of a loop of small bowel, and stable moderate to marked pancreatic atrophy. Her lab data are significant for a normal lipase, AST of 889, ALT of 375, lactic acid of 5.5, and WBC of 24. Her urinalysis is positive for nitrites, WBCs, and many bacteria. She is being admitted  for further evaluation and management.    Review of Systems: As above in history present illness. In addition, she has arthritic pain in her legs and back. She has occasional swelling in her ankles. Otherwise, review of systems negative.  Past Medical History  Diagnosis Date  . Cancer     uterine surg only  . Arthritis   . Kidney stone    Past Surgical History  Procedure Laterality Date  . Joint replacement    . Cholecystectomy    . Right hip surgery    . Left knee surgery and revision     Social History: She is widowed. She lives alone in [place]. She has 2 children. She is a retired Futures trader. She quit smoking over 60 years ago. She has a glass of wine daily. She still drives and is independent.  Allergies  Allergen Reactions  . Sulfa Antibiotics   . Aspirin Other (See Comments)    Makes stomach burn    Family History  Problem Relation Age of Onset  . Cancer Mother      Prior to Admission medications   Medication Sig Start Date End Date Taking? Authorizing Provider  acetaminophen (TYLENOL) 500 MG tablet Take 500 mg by mouth every 6 (six) hours as needed for pain.   Yes Historical Provider, MD  calcium carbonate (OS-CAL - DOSED IN MG OF ELEMENTAL CALCIUM) 1250 MG tablet Take 1 tablet by mouth daily with breakfast.   Yes Historical Provider, MD  cyclobenzaprine (FLEXERIL) 5 MG tablet Take 5 mg by mouth 3 (three) times daily as needed for muscle spasms.   Yes Historical Provider, MD  naproxen sodium (ALEVE) 220 MG tablet Take 220 mg by mouth 2 (two) times daily with a meal.   Yes Historical Provider, MD  predniSONE (DELTASONE) 10 MG tablet Take 10 mg by mouth daily.   Yes Historical Provider, MD  vitamin E 100 UNIT capsule Take 100 Units by mouth daily.   Yes Historical Provider, MD   Physical Exam: Filed Vitals:   12/31/12 1540  BP: 90/50  Pulse:   Temp:   Resp:      General:  Pleasant, alert 79 year old Caucasian woman laying in bed, in no acute  distress.  Eyes: Pupils are equal, round, and reactive to light. Extraocular was are intact. Conjunctivae are clear. Sclerae are white.  ENT: Oropharynx reveals good dentition. Mucous membranes are dry. No posterior exudates or erythema.  Neck: Supple, no adenopathy, no thyromegaly, no JVD.  Cardiovascular: S1, S2, with borderline tachycardia and several ectopic beats.  Respiratory: Clear anteriorly with decreased breath sounds in the bases. Breathing is nonlabored.  Abdomen: Protuberant, positive bowel sounds, soft, mildly to moderately tender diffusely, more on the left lower quadrant than right lower quadrant. Query small ventral hernia right lower quadrant. No rigid masses or distention or hepatosplenomegaly.  Skin: Fair turgor. Several scattered areas of ecchymosis on the lower extremities bilaterally.  Musculoskeletal/extremities: Arthritic hypertrophic changes seen left knee greater than right knee. No acute hot joints. Pedal pulses palpable.  Psychiatric: Pleasant affect. She is alert and oriented. Her speech is clear.  Neurologic: Cranial nerves II through XII are intact. Strength is grossly 5 over 5 throughout. Sensation is intact.  Labs on Admission:  Basic Metabolic Panel:  Recent Labs Lab 12/31/12 1150  NA 145  K 3.7  CL 107  CO2 25  GLUCOSE 79  BUN 20  CREATININE 1.01  CALCIUM 8.8   Liver Function Tests:  Recent Labs Lab 12/31/12 1150  AST 889*  ALT 372*  ALKPHOS 78  BILITOT 1.1  PROT 5.8*  ALBUMIN 2.9*    Recent Labs Lab 12/31/12 1150  LIPASE 43   No results found for this basename: AMMONIA,  in the last 168 hours CBC:  Recent Labs Lab 12/31/12 1150  WBC 23.9*  NEUTROABS 23.1*  HGB 12.7  HCT 39.7  MCV 100.5*  PLT 199   Cardiac Enzymes: No results found for this basename: CKTOTAL, CKMB, CKMBINDEX, TROPONINI,  in the last 168 hours  BNP (last 3 results) No results found for this basename: PROBNP,  in the last 8760 hours CBG: No  results found for this basename: GLUCAP,  in the last 168 hours  Radiological Exams on Admission: Ct Abdomen Pelvis W Contrast  12/31/2012   *RADIOLOGY REPORT*  Clinical Data: Generalized abdominal pain.  Diarrhea.  Surgical history includes cholecystectomy.  CT ABDOMEN AND PELVIS WITH CONTRAST  Technique:  Multidetector CT imaging of the abdomen and pelvis was performed following the standard protocol during bolus administration of intravenous contrast.  Contrast:  100 ml Omnipaque-300 IV.  Oral contrast also administered.  Comparison: CT abdomen pelvis 04/17/2011.  Findings: The entire colon is decompressed with the exception of the cecum, and the decompressed colon demonstrates enhancing mucosa and minimal wall thickening.  There is mild edema in the fat surrounding the colon.  There is no free intraperitoneal air or ascites.  Small bowel normal in caliber with the exception of mildly distended segment of mid small bowel in the pelvis which is chronic and similar to the prior examination.  Normal-appearing J- shaped stomach without evidence of  hiatal hernia.  Liver normal in size with a prominent Reidel lobe.  Calcified granuloma in the liver near the dome and in the posterior left lobe.  Mild periportal edema centrally in the liver, a nonspecific finding. 0.5 cm cyst in the caudate lobe.  No other focal parenchymal abnormality involving the liver.  Normal-appearing spleen with a focus of accessory splenic tissue anterior to the lower pole of the spleen.  Moderate to marked pancreatic atrophy, unchanged.  Gallbladder surgically absent which explains the mild extrahepatic biliary ductal dilation, unchanged. The common duct can be followed to the ampulla without obstructing stone or mass.  Normal adrenal glands.  Approximate 1.0 cm calculus in the right renal pelvis as noted previously without evidence of obstruction.  Mild enhancement of the wall of the right renal pelvis.  Mildly dilated proximal right ureter  without a visible ureteral calculus.  Phleboliths in the ovarian veins bilaterally mimic ureteral calculi.  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  No significant lymphadenopathy. Surgical clips in the retroperitoneum.  Uterus presumed surgically absent.  No adnexal masses or free pelvic fluid.  Numerous pelvic phleboliths.  Pelvic images degraded by beam hardening streak artifact from the right hip prosthesis; bone window images demonstrate osseous remodeling of the right acetabulum and lucency surrounding the femoral component of the prosthesis.  Bone window images also demonstrate severe thoracolumbar scoliosis, generalized osseous demineralization, degenerative changes involving the lower thoracic and lumbar spine, and moderate degenerative changes involving the left hip.  Scar in the visualized lung bases, unchanged.  Heart mildly enlarged.  IMPRESSION:  1.  Diffuse colitis which spares the cecum.  No evidence of free intraperitoneal air or ascites. 2.  1.0 cm calculus in the right renal pelvis without evidence of obstruction.  Enhancement wall of the right renal pelvis is likely due to chronic inflammation related to the calculus.  Please correlate with urinalysis. 3.  Chronic mild dilation of a loop of mid small bowel in the upper pelvis without evidence of small bowel obstruction currently. 4.  Stable moderate to marked pancreatic atrophy.   Original Report Authenticated By: Hulan Saas, M.D.   Dg Chest Port 1 View  12/31/2012   *RADIOLOGY REPORT*  Clinical Data: Abdominal pain.  Diarrhea.  Short of breath.  PORTABLE CHEST - 1 VIEW  Comparison: T 60,010  Findings: The patient is rotated towards the left.  There is chest deformity because of chronic scoliosis.  Heart size is probably normal.  I think there may be venous hypertension with interstitial edema.  No evidence of consolidation or major collapse.  IMPRESSION: Chest deformity due to chronic scoliosis.  Suspicion of venous  hypertension with interstitial edema.   Original Report Authenticated By: Paulina Fusi, M.D.    EKG: Independently reviewed. Sinus arrhythmia with a heart rate of 81 beats per minute.  Assessment/Plan Principal Problem:   Colitis Active Problems:   Sepsis   Diarrhea   Right kidney stone   Lactic acidosis   Hypotension   UTI (lower urinary tract infection)   Transaminitis   1. Abdominal pain, likely secondary to diffuse colitis, with possible contribution from right-sided kidney stone and urinary tract infection. 2. Diarrhea with colitis, rule out C. difficile colitis. 3. Sepsis secondary to colitis and urinary tract infection. 4. Hypotension, secondary to hypovolemia and volume depletion,, possible secondary adrenal insufficiency and possible septic shock. Her blood pressure improved to the 120s systolically in the emergency department following several liters of IV fluids. 5. Hepatic transaminitis. Etiology unclear at this  time. She has a history of cholecystectomy. 6. Lactic acidosis. Secondary to diarrhea and sepsis. 7. Urinary tract infection. 8. Chronic steroid therapy to treat arthritis. 9.   Pancreatic atrophy noted on CT scan. Lipase is within normal limits.    Plan: 1. Continue aggressive IV fluids. We'll add dextrose to one of the IV fluids as her blood sugar was below 80 and with additional IV fluids, she may become hypoglycemic. Consider pressors if her blood pressure decreases. 2. Start stress doses of IV hydrocortisone. Taper off and restart oral prednisone as her blood pressure continues to improve. 3. Will ask general surgery to place a central line. Also consider an art line. 4. The patient received Zosyn in the emergency department. Antibiotic therapy will be continued with Cipro and Flagyl. 5. Blood cultures and urine culture were ordered in the emergency department. We'll order C. difficile PCR. We'll order a hepatitis panel.     Code Status: Full code per  discussion. Family Communication: Family not available currently. Disposition Plan: To be determined  Time spent: Critical care time 1 hour 50 minutes.  [facility] Pager [phone]  If 7PM-7AM, please contact night-coverage [email or web address] Password [facility] 12/31/2012, 4:49 PM

## 2012-12-31 NOTE — Progress Notes (Signed)
Contacted by Pola Corn MD about low BP.   Dr Sherrie Mustache made aware of low BP of 70's/30's with MAP in the 40's and ELINK's concern about having a central line placed. Order recevied for central line placement.  Dr Leticia Penna paged and made aware. Consent signed and placed in the chart.

## 2012-12-31 NOTE — ED Notes (Signed)
Lab called and informed that pt available for second attempt at obtaining blood cultures. Xray contacted concerning pt portable chest. No answer in xray. Will attempt to call later.

## 2012-12-31 NOTE — ED Notes (Signed)
Pt give verbal permission to discuss pt care plan with friend,Marie Hedges, and pt daughter. Arnold Long contact information [phone].

## 2012-12-31 NOTE — Significant Event (Signed)
Pt with hypotension from sepsis.  Has CVL placed, and in good position.  Will monitor CVP to assist with fluid resuscitation.  Coralyn Helling, MD 12/31/2012, 10:12 PM

## 2012-12-31 NOTE — ED Notes (Signed)
Lab called and reported that both sets of blood cultures had been drawn. Will start abx when pt gets back from CT.

## 2012-12-31 NOTE — ED Notes (Signed)
Lab at bedside. Contacted xray. Xray reported that portable would be completed when pt went over for CT.

## 2012-12-31 NOTE — Procedures (Signed)
Central Venous Catheter Insertion Procedure Note [name] [id number] Nov 09, 1918  Procedure: Insertion of Central Venous Catheter Indications: Assessment of intravascular volume and Frequent blood sampling  Procedure Details Consent: Risks of procedure as well as the alternatives and risks of each were explained to the (patient/caregiver).  Consent for procedure obtained. Time Out: Verified patient identification, verified procedure, site/side was marked, verified correct patient position, special equipment/implants available, medications/allergies/relevent history reviewed, required imaging and test results available.  Performed  Maximum sterile technique was used including antiseptics, cap, gloves, gown, hand hygiene, mask and sheet. Skin prep: Chlorhexidine; local anesthetic administered A antimicrobial bonded/coated triple lumen catheter was placed in the left subclavian vein using the Seldinger technique.  Evaluation Blood flow good Complications: No apparent complications Patient did tolerate procedure well. Chest X-ray ordered to verify placement.  CXR: pending.  Darrin Koman C 12/31/2012, 9:25 PM

## 2013-01-01 DIAGNOSIS — K5289 Other specified noninfective gastroenteritis and colitis: Secondary | ICD-10-CM

## 2013-01-01 LAB — HEPATIC FUNCTION PANEL
ALT: 218 U/L — ABNORMAL HIGH (ref 0–35)
Alkaline Phosphatase: 74 U/L (ref 39–117)
Bilirubin, Direct: 0.2 mg/dL (ref 0.0–0.3)
Indirect Bilirubin: 0.2 mg/dL — ABNORMAL LOW (ref 0.3–0.9)
Total Protein: 5.1 g/dL — ABNORMAL LOW (ref 6.0–8.3)

## 2013-01-01 LAB — BASIC METABOLIC PANEL
BUN: 16 mg/dL (ref 6–23)
BUN: 16 mg/dL (ref 6–23)
CO2: 20 mEq/L (ref 19–32)
Calcium: 6.8 mg/dL — ABNORMAL LOW (ref 8.4–10.5)
Calcium: 6.7 mg/dL — ABNORMAL LOW (ref 8.4–10.5)
Chloride: 117 mEq/L — ABNORMAL HIGH (ref 96–112)
Creatinine, Ser: 0.98 mg/dL (ref 0.50–1.10)
GFR calc Af Amer: 56 mL/min — ABNORMAL LOW (ref >90)
GFR calc Af Amer: 62 mL/min — ABNORMAL LOW (ref >90)
GFR calc non Af Amer: 53 mL/min — ABNORMAL LOW (ref >90)
Glucose, Bld: 113 mg/dL — ABNORMAL HIGH (ref 70–99)
Potassium: 4.1 mEq/L (ref 3.5–5.1)
Sodium: 148 mEq/L — ABNORMAL HIGH (ref 135–145)

## 2013-01-01 LAB — CBC
Platelets: 188 10*3/uL (ref 150–400)
RBC: 3.17 MIL/uL — ABNORMAL LOW (ref 3.87–5.11)
RDW: 16.3 % — ABNORMAL HIGH (ref 11.5–15.5)
WBC: 50.7 10*3/uL (ref 4.0–10.5)

## 2013-01-01 LAB — LACTIC ACID, PLASMA: Lactic Acid, Venous: 2.3 mmol/L — ABNORMAL HIGH (ref 0.5–2.2)

## 2013-01-01 MED ORDER — VANCOMYCIN 50 MG/ML ORAL SOLUTION
125.0000 mg | Freq: Four times a day (QID) | ORAL | Status: DC
  Administered 2013-01-01 – 2013-01-07 (×23): 125 mg via ORAL
  Filled 2013-01-01 (×25): qty 2.5

## 2013-01-01 NOTE — Consult Note (Signed)
Reason for Consult: Abdominal pain, colitis Referring Physician: [facility]  [name] is an 79 y.o. female.  HPI: Patient presented to [facility] emergency department yesterday for increasing abdominal pain and nausea. Actually had the opportunity to initially evaluate her last night at the time of placing a central line. Today she states her pain is somewhat improved although she still having some abdominal pain. She feels as though she is tight with gas. She does not have any current nausea. No emesis. She has not been passing any flatus or had a bowel movement today. Her last bowel movement was reported as normal. She denies any melena or hematochezia. No sick contacts.  Past Medical History  Diagnosis Date  . Cancer     uterine surg only  . Arthritis   . Kidney stone   . Pancreatic atrophy 12/31/2012  . Colitis 12/31/2012    Past Surgical History  Procedure Laterality Date  . Joint replacement    . Cholecystectomy    . Right hip surgery    . Left knee surgery and revision      Family History  Problem Relation Age of Onset  . Cancer Mother     Social History:  reports that she quit smoking about 61 years ago. Her smoking use included Cigarettes. She smoked 0.00 packs per day. She has never used smokeless tobacco. She reports that she drinks about 0.6 ounces of alcohol per week. She reports that she does not use illicit drugs.  Allergies:  Allergies  Allergen Reactions  . Sulfa Antibiotics   . Aspirin Other (See Comments)    Makes stomach burn    Medications:  I have reviewed the patient's current medications. Prior to Admission:  Prescriptions prior to admission  Medication Sig Dispense Refill  . acetaminophen (TYLENOL) 500 MG tablet Take 500 mg by mouth every 6 (six) hours as needed for pain.      . calcium carbonate (OS-CAL - DOSED IN MG OF ELEMENTAL CALCIUM) 1250 MG tablet Take 1 tablet by mouth daily with breakfast.      . cyclobenzaprine (FLEXERIL)  5 MG tablet Take 5 mg by mouth 3 (three) times daily as needed for muscle spasms.      . naproxen sodium (ALEVE) 220 MG tablet Take 220 mg by mouth 2 (two) times daily with a meal.      . predniSONE (DELTASONE) 10 MG tablet Take 10 mg by mouth daily.      . vitamin E 100 UNIT capsule Take 100 Units by mouth daily.       Scheduled: . ciprofloxacin  200 mg Intravenous Q12H  . enoxaparin (LOVENOX) injection  30 mg Subcutaneous Q24H  . hydrocortisone sod succinate (SOLU-CORTEF) inj  100 mg Intravenous Q6H  . metronidazole  500 mg Intravenous Q8H  . sodium chloride  10-40 mL Intracatheter Q12H  . vancomycin  125 mg Oral Q6H   Continuous: . 0.9 % NaCl with KCl 20 mEq / L 125 mL/hr at 01/01/13 2000  . dextrose 5 % and 0.9 % NaCl with KCl 20 mEq/L 50 mL/hr at 01/01/13 2000   OZH:YQMVHQIONGEXB, acetaminophen, albuterol, morphine injection, sodium chloride  Results for orders placed during the hospital encounter of 12/31/12 (from the past 48 hour(s))  COMPREHENSIVE METABOLIC PANEL     Status: Abnormal   Collection Time    12/31/12 11:50 AM      Result Value Range   Sodium 145  135 - 145 mEq/L   Potassium 3.7  3.5 - 5.1 mEq/L   Chloride 107  96 - 112 mEq/L   CO2 25  19 - 32 mEq/L   Glucose, Bld 79  70 - 99 mg/dL   BUN 20  6 - 23 mg/dL   Creatinine, Ser 4.09  0.50 - 1.10 mg/dL   Calcium 8.8  8.4 - 81.1 mg/dL   Total Protein 5.8 (*) 6.0 - 8.3 g/dL   Albumin 2.9 (*) 3.5 - 5.2 g/dL   AST 914 (*) 0 - 37 U/L   ALT 372 (*) 0 - 35 U/L   Alkaline Phosphatase 78  39 - 117 U/L   Total Bilirubin 1.1  0.3 - 1.2 mg/dL   GFR calc non Af Amer 47 (*) >90 mL/min   GFR calc Af Amer 54 (*) >90 mL/min   Comment: (NOTE)     The eGFR has been calculated using the CKD EPI equation.     This calculation has not been validated in all clinical situations.     eGFR's persistently <90 mL/min signify possible Chronic Kidney     Disease.  CBC WITH DIFFERENTIAL     Status: Abnormal   Collection Time    12/31/12  11:50 AM      Result Value Range   WBC 23.9 (*) 4.0 - 10.5 K/uL   RBC 3.95  3.87 - 5.11 MIL/uL   Hemoglobin 12.7  12.0 - 15.0 g/dL   HCT 78.2  95.6 - 21.3 %   MCV 100.5 (*) 78.0 - 100.0 fL   MCH 32.2  26.0 - 34.0 pg   MCHC 32.0  30.0 - 36.0 g/dL   RDW 08.6  57.8 - 46.9 %   Platelets 199  150 - 400 K/uL   Neutrophils Relative % 97 (*) 43 - 77 %   Neutro Abs 23.1 (*) 1.7 - 7.7 K/uL   Lymphocytes Relative 3 (*) 12 - 46 %   Lymphs Abs 0.6 (*) 0.7 - 4.0 K/uL   Monocytes Relative 0 (*) 3 - 12 %   Monocytes Absolute 0.1  0.1 - 1.0 K/uL   Eosinophils Relative 0  0 - 5 %   Eosinophils Absolute 0.1  0.0 - 0.7 K/uL   Basophils Relative 0  0 - 1 %   Basophils Absolute 0.0  0.0 - 0.1 K/uL   WBC Morphology INCREASED BANDS (>20% BANDS)     Comment: VACUOLATED NEUTROPHILS  LIPASE, BLOOD     Status: None   Collection Time    12/31/12 11:50 AM      Result Value Range   Lipase 43  11 - 59 U/L  LACTIC ACID, PLASMA     Status: Abnormal   Collection Time    12/31/12 11:52 AM      Result Value Range   Lactic Acid, Venous 5.5 (*) 0.5 - 2.2 mmol/L  URINALYSIS, ROUTINE W REFLEX MICROSCOPIC     Status: Abnormal   Collection Time    12/31/12  1:16 PM      Result Value Range   Color, Urine YELLOW  YELLOW   APPearance CLEAR  CLEAR   Specific Gravity, Urine 1.010  1.005 - 1.030   pH 6.5  5.0 - 8.0   Glucose, UA NEGATIVE  NEGATIVE mg/dL   Hgb urine dipstick MODERATE (*) NEGATIVE   Bilirubin Urine NEGATIVE  NEGATIVE   Ketones, ur NEGATIVE  NEGATIVE mg/dL   Protein, ur 30 (*) NEGATIVE mg/dL   Urobilinogen, UA 0.2  0.0 - 1.0 mg/dL  Nitrite POSITIVE (*) NEGATIVE   Leukocytes, UA SMALL (*) NEGATIVE  URINE MICROSCOPIC-ADD ON     Status: Abnormal   Collection Time    12/31/12  1:16 PM      Result Value Range   Squamous Epithelial / LPF FEW (*) RARE   WBC, UA 21-50  <3 WBC/hpf   RBC / HPF 3-6  <3 RBC/hpf   Bacteria, UA MANY (*) RARE  MRSA PCR SCREENING     Status: None   Collection Time     12/31/12  4:46 PM      Result Value Range   MRSA by PCR NEGATIVE  NEGATIVE   Comment:            The GeneXpert MRSA Assay (FDA     approved for NASAL specimens     only), is one component of a     comprehensive MRSA colonization     surveillance program. It is not     intended to diagnose MRSA     infection nor to guide or     monitor treatment for     MRSA infections.  CBC     Status: Abnormal   Collection Time    01/01/13  5:01 AM      Result Value Range   WBC 50.7 (*) 4.0 - 10.5 K/uL   Comment: WHITE COUNT CONFIRMED ON SMEAR     CRITICAL RESULT CALLED TO, READ BACK BY AND VERIFIED WITH:     SMITH J. AT 1610R ON [id number] BY THOMPSON S.   RBC 3.17 (*) 3.87 - 5.11 MIL/uL   Hemoglobin 10.5 (*) 12.0 - 15.0 g/dL   Comment: DELTA CHECK NOTED     RESULT REPEATED AND VERIFIED   HCT 32.0 (*) 36.0 - 46.0 %   MCV 100.9 (*) 78.0 - 100.0 fL   MCH 33.1  26.0 - 34.0 pg   MCHC 32.8  30.0 - 36.0 g/dL   RDW 98.1 (*) 19.1 - 47.8 %   Platelets 188  150 - 400 K/uL  BASIC METABOLIC PANEL     Status: Abnormal   Collection Time    01/01/13  5:01 AM      Result Value Range   Sodium 146 (*) 135 - 145 mEq/L   Potassium 4.1  3.5 - 5.1 mEq/L   Chloride 117 (*) 96 - 112 mEq/L   Comment: DELTA CHECK NOTED   CO2 20  19 - 32 mEq/L   Glucose, Bld 110 (*) 70 - 99 mg/dL   BUN 16  6 - 23 mg/dL   Creatinine, Ser 2.95  0.50 - 1.10 mg/dL   Calcium 6.8 (*) 8.4 - 10.5 mg/dL   GFR calc non Af Amer 49 (*) >90 mL/min   GFR calc Af Amer 56 (*) >90 mL/min   Comment: (NOTE)     The eGFR has been calculated using the CKD EPI equation.     This calculation has not been validated in all clinical situations.     eGFR's persistently <90 mL/min signify possible Chronic Kidney     Disease.  HEPATIC FUNCTION PANEL     Status: Abnormal   Collection Time    01/01/13  5:01 AM      Result Value Range   Total Protein 5.1 (*) 6.0 - 8.3 g/dL   Albumin 2.2 (*) 3.5 - 5.2 g/dL   AST 621 (*) 0 - 37 U/L   ALT 218 (*) 0 - 35  U/L   Alkaline  Phosphatase 74  39 - 117 U/L   Total Bilirubin 0.4  0.3 - 1.2 mg/dL   Bilirubin, Direct 0.2  0.0 - 0.3 mg/dL   Indirect Bilirubin 0.2 (*) 0.3 - 0.9 mg/dL  LACTIC ACID, PLASMA     Status: Abnormal   Collection Time    01/01/13  5:02 AM      Result Value Range   Lactic Acid, Venous 2.3 (*) 0.5 - 2.2 mmol/L  BASIC METABOLIC PANEL     Status: Abnormal   Collection Time    01/01/13  7:43 AM      Result Value Range   Sodium 148 (*) 135 - 145 mEq/L   Potassium 4.1  3.5 - 5.1 mEq/L   Chloride 120 (*) 96 - 112 mEq/L   CO2 19  19 - 32 mEq/L   Glucose, Bld 113 (*) 70 - 99 mg/dL   BUN 16  6 - 23 mg/dL   Creatinine, Ser 9.56  0.50 - 1.10 mg/dL   Calcium 6.7 (*) 8.4 - 10.5 mg/dL   GFR calc non Af Amer 53 (*) >90 mL/min   GFR calc Af Amer 62 (*) >90 mL/min   Comment: (NOTE)     The eGFR has been calculated using the CKD EPI equation.     This calculation has not been validated in all clinical situations.     eGFR's persistently <90 mL/min signify possible Chronic Kidney     Disease.    Dg Chest 1 View  12/31/2012   *RADIOLOGY REPORT*  Clinical Data: Placement  CHEST - 1 VIEW  Comparison: Portable exam 2135 hours compared to 1434 hours  Findings: Thoracic deformity secondary to scoliosis. Left subclavian central venous catheter with tip projecting over SVC. No gross evidence of pneumothorax. Enlargement of cardiac silhouette. Diffuse accentuation of interstitial markings question edema. Bones diffusely demineralized with advanced left glenohumeral degenerative changes and marked dextroconvex thoracic scoliosis.  IMPRESSION: No definite pneumothorax following central line placement. Question pulmonary edema.   Original Report Authenticated By: Ulyses Southward, M.D.   Ct Abdomen Pelvis W Contrast  12/31/2012   *RADIOLOGY REPORT*  Clinical Data: Generalized abdominal pain.  Diarrhea.  Surgical history includes cholecystectomy.  CT ABDOMEN AND PELVIS WITH CONTRAST  Technique:  Multidetector  CT imaging of the abdomen and pelvis was performed following the standard protocol during bolus administration of intravenous contrast.  Contrast:  100 ml Omnipaque-300 IV.  Oral contrast also administered.  Comparison: CT abdomen pelvis 04/17/2011.  Findings: The entire colon is decompressed with the exception of the cecum, and the decompressed colon demonstrates enhancing mucosa and minimal wall thickening.  There is mild edema in the fat surrounding the colon.  There is no free intraperitoneal air or ascites.  Small bowel normal in caliber with the exception of mildly distended segment of mid small bowel in the pelvis which is chronic and similar to the prior examination.  Normal-appearing J- shaped stomach without evidence of hiatal hernia.  Liver normal in size with a prominent Reidel lobe.  Calcified granuloma in the liver near the dome and in the posterior left lobe.  Mild periportal edema centrally in the liver, a nonspecific finding. 0.5 cm cyst in the caudate lobe.  No other focal parenchymal abnormality involving the liver.  Normal-appearing spleen with a focus of accessory splenic tissue anterior to the lower pole of the spleen.  Moderate to marked pancreatic atrophy, unchanged.  Gallbladder surgically absent which explains the mild extrahepatic biliary ductal dilation, unchanged. The common  duct can be followed to the ampulla without obstructing stone or mass.  Normal adrenal glands.  Approximate 1.0 cm calculus in the right renal pelvis as noted previously without evidence of obstruction.  Mild enhancement of the wall of the right renal pelvis.  Mildly dilated proximal right ureter without a visible ureteral calculus.  Phleboliths in the ovarian veins bilaterally mimic ureteral calculi.  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  No significant lymphadenopathy. Surgical clips in the retroperitoneum.  Uterus presumed surgically absent.  No adnexal masses or free pelvic fluid.   Numerous pelvic phleboliths.  Pelvic images degraded by beam hardening streak artifact from the right hip prosthesis; bone window images demonstrate osseous remodeling of the right acetabulum and lucency surrounding the femoral component of the prosthesis.  Bone window images also demonstrate severe thoracolumbar scoliosis, generalized osseous demineralization, degenerative changes involving the lower thoracic and lumbar spine, and moderate degenerative changes involving the left hip.  Scar in the visualized lung bases, unchanged.  Heart mildly enlarged.  IMPRESSION:  1.  Diffuse colitis which spares the cecum.  No evidence of free intraperitoneal air or ascites. 2.  1.0 cm calculus in the right renal pelvis without evidence of obstruction.  Enhancement wall of the right renal pelvis is likely due to chronic inflammation related to the calculus.  Please correlate with urinalysis. 3.  Chronic mild dilation of a loop of mid small bowel in the upper pelvis without evidence of small bowel obstruction currently. 4.  Stable moderate to marked pancreatic atrophy.   Original Report Authenticated By: Hulan Saas, M.D.   Dg Chest Port 1 View  12/31/2012   *RADIOLOGY REPORT*  Clinical Data: Abdominal pain.  Diarrhea.  Short of breath.  PORTABLE CHEST - 1 VIEW  Comparison: T 60,010  Findings: The patient is rotated towards the left.  There is chest deformity because of chronic scoliosis.  Heart size is probably normal.  I think there may be venous hypertension with interstitial edema.  No evidence of consolidation or major collapse.  IMPRESSION: Chest deformity due to chronic scoliosis.  Suspicion of venous hypertension with interstitial edema.   Original Report Authenticated By: Paulina Fusi, M.D.    Review of Systems  Constitutional: Positive for chills and malaise/fatigue.  HENT: Negative.   Eyes: Negative.   Respiratory: Positive for cough and sputum production. Negative for hemoptysis.   Cardiovascular:  Negative for chest pain and palpitations.  Gastrointestinal: Positive for nausea and abdominal pain. Negative for heartburn, vomiting, diarrhea, constipation, blood in stool and melena.  Genitourinary: Positive for dysuria.  Musculoskeletal: Negative.   Skin: Negative.   Neurological: Negative.   Endo/Heme/Allergies: Negative.   Psychiatric/Behavioral: Negative.    Blood pressure 99/50, pulse 83, temperature 98.2 F (36.8 C), temperature source Oral, resp. rate 27, height 4\' 6"  (1.372 m), weight 53.207 kg (117 lb 4.8 oz), SpO2 98.00%. Physical Exam  Constitutional: She appears well-developed and well-nourished. No distress.  Elderly, short statured, obvious scoliosis.  HENT:  Head: Normocephalic and atraumatic.  Eyes: Conjunctivae and EOM are normal. Pupils are equal, round, and reactive to light.  Neck: Normal range of motion. No tracheal deviation present.  Cardiovascular: Normal rate and regular rhythm.   Respiratory: Effort normal and breath sounds normal.  GI: Soft. She exhibits distension. She exhibits no mass. There is tenderness (mild diffuse tenderness, no diffuse peritoneal signs.). There is no rebound and no guarding.  Lymphadenopathy:    She has no cervical adenopathy.  Neurological: She is alert.  Skin: Skin is  warm and dry.    Assessment/Plan: Pancolitis, leukocytosis, abdominal pain, UTI. At this time continue broad-spectrum antibiotics. I agree with initiation of oral vancomycin with a high suspicion despite patient's lack of loose stool but this is likely related to C. difficile type of picture. Clinically her abdomen is soft and there is no evidence of any acute abdomen at this time. Clinically she remains responsive and does not appear acutely ill. We'll continue to monitor her course extremely closely given her age.  Broden Holt C 01/01/2013, 8:44 PM

## 2013-01-01 NOTE — Consult Note (Signed)
Referring Provider: No ref. provider found Primary Care Physician:  Alice Reichert, MD Primary Gastroenterologist:  Dr.Yonael Tulloch  Reason for Consultation:  Diarrhea, abdominal pain, colitis on CT  HPI: Very pleasant 79 year old lady admitted to the hospital last night with a two-day history of abrupt onset nonbloody diarrhea diffuse abdominal cramps and hypotension/ sepsis - like syndrome.   CT scan (reviewed with Dr. Tyron Russell)  reveals a mild diffuse colitis. No evidence of thromboembolic disease. No evidence of abscess or neoplasm. Mesenteric vasculature looks good.  She has been started on Zosyn, Cipro and Flagyl empirically. Stool studies have been ordered but she has not been able to produce a stool since admission. Her blood pressure and pulse are much better. She has a leukemoid reaction with a white count of 50,000 this morning.  She is now being treated currently for urinary tract infection. She tells me she was treated for urinary tract infection back in April by Dr. Dickey Gave and Dr. Vernice Jefferson gave her samples of antibiotics subsequently as well. Denies any other antibiotic exposure. Baseline bowel function is that of constipation. She denies any rectal bleeding any time. No prior colonoscopy. No family history of GI illness/neoplasia. No history of inflammatory bowel disease. She has well water at home. No ill contacts.  Past Medical History  Diagnosis Date  . Cancer     uterine surg only  . Arthritis   . Kidney stone   . Pancreatic atrophy 12/31/2012  . Colitis 12/31/2012    Past Surgical History  Procedure Laterality Date  . Joint replacement    . Cholecystectomy    . Right hip surgery    . Left knee surgery and revision      Prior to Admission medications   Medication Sig Start Date End Date Taking? Authorizing Provider  acetaminophen (TYLENOL) 500 MG tablet Take 500 mg by mouth every 6 (six) hours as needed for pain.   Yes Historical Provider, MD  calcium carbonate (OS-CAL -  DOSED IN MG OF ELEMENTAL CALCIUM) 1250 MG tablet Take 1 tablet by mouth daily with breakfast.   Yes Historical Provider, MD  cyclobenzaprine (FLEXERIL) 5 MG tablet Take 5 mg by mouth 3 (three) times daily as needed for muscle spasms.   Yes Historical Provider, MD  naproxen sodium (ALEVE) 220 MG tablet Take 220 mg by mouth 2 (two) times daily with a meal.   Yes Historical Provider, MD  predniSONE (DELTASONE) 10 MG tablet Take 10 mg by mouth daily.   Yes Historical Provider, MD  vitamin E 100 UNIT capsule Take 100 Units by mouth daily.   Yes Historical Provider, MD    Current Facility-Administered Medications  Medication Dose Route Frequency Provider Last Rate Last Dose  . 0.9 % NaCl with KCl 20 mEq/ L  infusion   Intravenous Continuous Elliot Cousin, MD 125 mL/hr at 01/01/13 1028    . acetaminophen (TYLENOL) tablet 650 mg  650 mg Oral Q6H PRN Elliot Cousin, MD   650 mg at 01/01/13 1610   Or  . acetaminophen (TYLENOL) suppository 650 mg  650 mg Rectal Q6H PRN Elliot Cousin, MD      . albuterol (PROVENTIL) (5 MG/ML) 0.5% nebulizer solution 2.5 mg  2.5 mg Nebulization Q2H PRN Elliot Cousin, MD      . ciprofloxacin (CIPRO) IVPB 200 mg  200 mg Intravenous Q12H Elliot Cousin, MD   200 mg at 01/01/13 0746  . dextrose 5 % and 0.9 % NaCl with KCl 20 mEq/L infusion   Intravenous Continuous  Elliot Cousin, MD 50 mL/hr at 01/01/13 1030    . enoxaparin (LOVENOX) injection 30 mg  30 mg Subcutaneous Q24H Elliot Cousin, MD   30 mg at 12/31/12 2152  . hydrocortisone sodium succinate (SOLU-CORTEF) 100 mg/2 mL injection 100 mg  100 mg Intravenous Q6H Elliot Cousin, MD   100 mg at 01/01/13 0532  . metroNIDAZOLE (FLAGYL) IVPB 500 mg  500 mg Intravenous Q8H Elliot Cousin, MD   500 mg at 01/01/13 0909  . morphine 2 MG/ML injection 2 mg  2 mg Intravenous Q2H PRN Elliot Cousin, MD      . sodium chloride 0.9 % injection 10-40 mL  10-40 mL Intracatheter Q12H Fabio Bering, MD   10 mL at 01/01/13 0909  . sodium chloride  0.9 % injection 10-40 mL  10-40 mL Intracatheter PRN Fabio Bering, MD        Allergies as of 12/31/2012 - Review Complete 12/31/2012  Allergen Reaction Noted  . Sulfa antibiotics  12/31/2012  . Aspirin Other (See Comments) 04/25/2011    Family History  Problem Relation Age of Onset  . Cancer Mother     History   Social History  . Marital Status: Widowed    Spouse Name: N/A    Number of Children: N/A  . Years of Education: N/A   Occupational History  . Not on file.   Social History Main Topics  . Smoking status: Former Smoker    Types: Cigarettes    Quit date: 04/25/1951  . Smokeless tobacco: Never Used  . Alcohol Use: 0.6 oz/week    1 Glasses of wine per week     Comment: 1 glass of wine a day.  . Drug Use: No  . Sexual Activity: No   Other Topics Concern  . Not on file   Social History Narrative  . No narrative on file    Review of Systems: Gen: Denies any fever, chills, sweats, anorexia, fatigue, weakness, malaise, weight loss, and sleep disorder CV: Denies chest pain, angina, palpitations, syncope, orthopnea, PND, peripheral edema, and claudication. Resp: Denies dyspnea at rest, dyspnea with exercise, cough, sputum, wheezing, coughing up blood, and pleurisy. GI: Denies vomiting blood, jaundice, and fecal incontinence.   Denies dysphagia or odynophagia. Derm: Denies rash, itching, dry skin, hives, moles, warts, or unhealing ulcers.  Psych: Denies depression, anxiety, memory loss, suicidal ideation, hallucinations, paranoia, and confusion. Heme: Denies  enlarged lymph nodes.   Physical Exam: Vital signs in last 24 hours: Temp:  [97.8 F (36.6 C)-101.1 F (38.4 C)] 98.3 F (36.8 C) (08/23 0800) Pulse Rate:  [65-142] 70 (08/23 1030) Resp:  [12-27] 24 (08/23 1030) BP: (58-134)/(21-93) 94/47 mmHg (08/23 1030) SpO2:  [88 %-100 %] 99 % (08/23 1030) Weight:  [97 lb (43.999 kg)-117 lb 4.8 oz (53.207 kg)] 117 lb 4.8 oz (53.207 kg) (08/23 0445) Last BM Date:  12/31/12 General:   Alert,  well oriented conversant lady somewhat hard of hearing but appears to be in no acute distress whatsoever.  Head:  Normocephalic and atraumatic. Eyes:  Sclera clear, no icterus.   Conjunctiva pink. Ears:  Normal auditory acuity. Nose:  No deformity, discharge,  or lesions. Mouth:  No deformity or lesions, dentition normal. Neck:  Supple; no masses or thyromegaly. Lungs:  Clear throughout to auscultation.   No wheezes, crackles, or rhonchi. No acute distress. Heart:  Regular rate and rhythm; no murmurs, clicks, rubs,  or gallops. Abdomen:  Full. Positive bowel sounds. No bruits. The abdomen is only  minimally diffusely tender without obvious mass or organomegaly Rectal:  Deferred until time of colonoscopy.   Msk:  Symmetrical without gross deformities. Normal posture. Pulses:  Normal pulses noted. Extremities:  Without clubbing or edema. Neurologic:  Alert and  oriented x4;  grossly normal neurologically. Skin:  Intact without significant lesions or rashes. Cervical Nodes:  No significant cervical adenopathy. Psych:  Alert and cooperative. Normal mood and affect.  Intake/Output from previous day: 08/22 0701 - 08/23 0700 In: 2714.6 [I.V.:2364.6; IV Piggyback:350] Out: 1000 [Urine:1000] Intake/Output this shift: Total I/O In: 825 [P.O.:100; I.V.:525; IV Piggyback:200] Out: 350 [Urine:350]  Lab Results:  Recent Labs  12/31/12 1150 01/01/13 0501  WBC 23.9* 50.7*  HGB 12.7 10.5*  HCT 39.7 32.0*  PLT 199 188   BMET  Recent Labs  12/31/12 1150 01/01/13 0501 01/01/13 0743  NA 145 146* 148*  K 3.7 4.1 4.1  CL 107 117* 120*  CO2 25 20 19   GLUCOSE 79 110* 113*  BUN 20 16 16   CREATININE 1.01 0.98 0.91  CALCIUM 8.8 6.8* 6.7*   LFT  Recent Labs  01/01/13 0501  PROT 5.1*  ALBUMIN 2.2*  AST 205*  ALT 218*  ALKPHOS 74  BILITOT 0.4  BILIDIR 0.2  IBILI 0.2*   Impression:    Very pleasant 79 year old lady admitted hospital with acute illness  characterized by abdominal pain, nonbloody diarrhea and hypotension. Elevated lactic acid and a leukemoid reaction. Markedly elevated, but rapidly declining, aminotransferases.  The scenario is very suspicious for Clostridium difficile or pseudomembranous colitis. Fortunately, she looks better clinically than her numbers would suggest. She does not have a megacolon and renal function remains normal.  Almost certainly the markedly elevated aminotransferases are secondary to shock liver or ischemic hepatopathy secondary to her acute illness. We should see them continue to rapidly normalize.    Recommendations:    Add oral vancomycin to her regimen empirically. Strive to get a stool sample to the lab (discussed with nursing staff).   May need a sigmoidoscopy in the near future to sort out further.  Would continue to avoid acid suppression therapy. Would also avoid nonsteroidals for now (has been on Naprosyn) as this could exacerbate colitis from any cause.  Will follow with you. Thanks for consultation.

## 2013-01-01 NOTE — Progress Notes (Signed)
[name], [name]             ACCOUNT NO.:  [id number]  MEDICAL RECORD NO.:  [id number]  LOCATION:  IC09                          FACILITY:  [facility]  PHYSICIAN:  Chardai Gangemi G. Renard Matter, MD   DATE OF BIRTH:  [birth year]  DATE OF PROCEDURE: DATE OF DISCHARGE:                                PROGRESS NOTE   HISTORY OF PRESENT ILLNESS:  This patient was admitted with abdominal pain and diarrhea.  She had, had some vomiting and profuse diarrhea, and abdominal pain.  She was somewhat hypotensive, but this improved.  CT of abdomen revealed diffuse colitis, 1 cm calculus right renal pelvis, mild dilatation of loops of bowel.  Urinalysis was positive for nitrite with increased number of WBCs.  The patient does have a markedly abnormal white count 23.9 with 23.1 neutrophils.  PHYSICAL EXAMINATION:  GENERAL:  Alert female. VITAL SIGNS:  Blood pressures 80/43, respirations 21, pulse 68, temperature 98. HEENT:  Negative. NECK:  Supple.  No JVD or thyroid abnormalities. HEART:  Regular rhythm. LUNGS:  Clear to P and A. ABDOMEN:  Distended, diffusely tender. SKIN:  Warm and dry. MUSCULOSKELETAL:  This patient has kyphosis of the thoracic spine.  ASSESSMENT:  The patient admitted with urinary tract infection, abdominal pain due to colitis involving most of the large bowel, right- sided kidney stone, and hypotension.  PLAN:  To continue current IV fluids.  Continue current antibiotic regimen.  IV Cipro 200 mg every 12 hours and IV Flagyl 500 mg every 8 hours.  We will obtain Gastroenterology consult.     Stori Royse G. Renard Matter, MD     AGM/MEDQ  D:  01/01/2013  T:  01/01/2013  Job:  [id number]

## 2013-01-02 LAB — CBC WITH DIFFERENTIAL/PLATELET
Blasts: 0 %
Eosinophils Absolute: 0 10*3/uL (ref 0.0–0.7)
Eosinophils Relative: 0 % (ref 0–5)
MCH: 33 pg (ref 26.0–34.0)
MCV: 100.7 fL — ABNORMAL HIGH (ref 78.0–100.0)
Metamyelocytes Relative: 0 %
Myelocytes: 0 %
Neutro Abs: 48.7 10*3/uL — ABNORMAL HIGH (ref 1.7–7.7)
Neutrophils Relative %: 55 % (ref 43–77)
Platelets: 177 10*3/uL (ref 150–400)
Promyelocytes Absolute: 0 %
RBC: 3 MIL/uL — ABNORMAL LOW (ref 3.87–5.11)
RDW: 16.8 % — ABNORMAL HIGH (ref 11.5–15.5)
nRBC: 0 /100 WBC

## 2013-01-02 LAB — HEPATITIS PANEL, ACUTE
Hep A IgM: NEGATIVE
Hep B C IgM: NEGATIVE
Hepatitis B Surface Ag: NEGATIVE

## 2013-01-02 LAB — BASIC METABOLIC PANEL
BUN: 13 mg/dL (ref 6–23)
CO2: 19 mEq/L (ref 19–32)
Chloride: 119 mEq/L — ABNORMAL HIGH (ref 96–112)
GFR calc non Af Amer: 73 mL/min — ABNORMAL LOW (ref >90)
Glucose, Bld: 127 mg/dL — ABNORMAL HIGH (ref 70–99)
Potassium: 3.3 mEq/L — ABNORMAL LOW (ref 3.5–5.1)
Sodium: 147 mEq/L — ABNORMAL HIGH (ref 135–145)

## 2013-01-02 MED ORDER — SODIUM CHLORIDE 0.9 % IV SOLN
1.0000 g | Freq: Once | INTRAVENOUS | Status: AC
  Administered 2013-01-02: 1 g via INTRAVENOUS
  Filled 2013-01-02 (×2): qty 10

## 2013-01-02 MED ORDER — ONDANSETRON HCL 4 MG/2ML IJ SOLN
4.0000 mg | Freq: Four times a day (QID) | INTRAMUSCULAR | Status: DC | PRN
  Administered 2013-01-05 (×2): 4 mg via INTRAVENOUS
  Filled 2013-01-02 (×2): qty 2

## 2013-01-02 MED ORDER — ONDANSETRON HCL 4 MG/2ML IJ SOLN
INTRAMUSCULAR | Status: AC
  Administered 2013-01-02: 4 mg via INTRAVENOUS
  Filled 2013-01-02: qty 2

## 2013-01-02 MED ORDER — ALUM & MAG HYDROXIDE-SIMETH 200-200-20 MG/5ML PO SUSP
30.0000 mL | ORAL | Status: DC | PRN
  Administered 2013-01-02: 30 mL via ORAL
  Filled 2013-01-02: qty 30

## 2013-01-02 NOTE — Progress Notes (Signed)
Subjective: No significant change overnight. Patient had her first loose nonbloody stool this morning since admission - being sent to the lab. Nausea but no vomiting. Tolerating oral vancomycin therapy  Vital signs in last 24 hours: Temp:  [98 F (36.7 C)-98.7 F (37.1 C)] 98 F (36.7 C) (08/24 0800) Pulse Rate:  [44-120] 73 (08/24 0800) Resp:  [0-30] 21 (08/24 0800) BP: (73-119)/(34-88) 119/56 mmHg (08/24 0800) SpO2:  [64 %-100 %] 98 % (08/24 0808) Last BM Date: 01/02/13 General:   Frail elderly lady. Alert, pleasant and cooperative in NAD Abdomen:  Full. Positive bowel sounds. Mild diffuse abdominal tenderness. No mass or organomegaly.  Extremities:  Without clubbing or edema.    Intake/Output from previous day: 08/23 0701 - 08/24 0700 In: 5100 [P.O.:400; I.V.:4200; IV Piggyback:500] Out: 1450 [Urine:1450] Intake/Output this shift: Total I/O In: 275 [I.V.:175; IV Piggyback:100] Out: -   Lab Results:  Recent Labs  12/31/12 1150 01/01/13 0501 01/02/13 0725  WBC 23.9* 50.7* 50.7*  HGB 12.7 10.5* 9.9*  HCT 39.7 32.0* 30.2*  PLT 199 188 177   BMET  Recent Labs  01/01/13 0501 01/01/13 0743 01/02/13 0725  NA 146* 148* 147*  K 4.1 4.1 3.3*  CL 117* 120* 119*  CO2 20 19 19   GLUCOSE 110* 113* 127*  BUN 16 16 13   CREATININE 0.98 0.91 0.71  CALCIUM 6.8* 6.7* 6.2*   LFT  Recent Labs  01/01/13 0501  PROT 5.1*  ALBUMIN 2.2*  AST 205*  ALT 218*  ALKPHOS 74  BILITOT 0.4  BILIDIR 0.2  IBILI 0.2*      Impression:      Pleasant 79 year old lady admitted with acute onset of nonbloody diarrhea, colitis on CT, and  leukemoid reaction. Sepsis-like picture on admission. She has a persistent significant leukocytosis. Renal function remains good Secondary ischemic hepatitis also much improved.   Recommendations:   Continue current antibiotic regimen. Follow up on stool studies. Hold off on sigmoidoscopy for  now. Repeat LFTs tomorrow morning.

## 2013-01-02 NOTE — Progress Notes (Signed)
Dr Felecia Shelling paged and made aware that pt is complaining about indigestion and is requesting something. New order received for Mylanta.  Will continue to monitor.

## 2013-01-02 NOTE — Progress Notes (Signed)
Subjective: Comfortable. Still feels bloated. Denies any increase in abdominal pain.  Objective: Vital signs in last 24 hours: Temp:  [98 F (36.7 C)-98.7 F (37.1 C)] 98 F (36.7 C) (08/24 0800) Pulse Rate:  [44-120] 73 (08/24 0800) Resp:  [0-30] 21 (08/24 0800) BP: (73-119)/(34-88) 119/56 mmHg (08/24 0800) SpO2:  [64 %-100 %] 98 % (08/24 0808) Last BM Date: 01/02/13  Intake/Output from previous day: 08/23 0701 - 08/24 0700 In: 5100 [P.O.:400; I.V.:4200; IV Piggyback:500] Out: 1450 [Urine:1450] Intake/Output this shift: Total I/O In: 275 [I.V.:175; IV Piggyback:100] Out: -   General appearance: alert, no distress and Pleasant GI: Positive bowel sounds, soft, distended, mild to moderate epigastric tenderness. No peritoneal signs. No diffuse tenderness.  Lab Results:   Recent Labs  01/01/13 0501 01/02/13 0725  WBC 50.7* 50.7*  HGB 10.5* 9.9*  HCT 32.0* 30.2*  PLT 188 177   BMET  Recent Labs  01/01/13 0743 01/02/13 0725  NA 148* 147*  K 4.1 3.3*  CL 120* 119*  CO2 19 19  GLUCOSE 113* 127*  BUN 16 13  CREATININE 0.91 0.71  CALCIUM 6.7* 6.2*   PT/INR No results found for this basename: LABPROT, INR,  in the last 72 hours ABG No results found for this basename: PHART, PCO2, PO2, HCO3,  in the last 72 hours  Studies/Results: Dg Chest 1 View  12/31/2012   *RADIOLOGY REPORT*  Clinical Data: Placement  CHEST - 1 VIEW  Comparison: Portable exam 2135 hours compared to 1434 hours  Findings: Thoracic deformity secondary to scoliosis. Left subclavian central venous catheter with tip projecting over SVC. No gross evidence of pneumothorax. Enlargement of cardiac silhouette. Diffuse accentuation of interstitial markings question edema. Bones diffusely demineralized with advanced left glenohumeral degenerative changes and marked dextroconvex thoracic scoliosis.  IMPRESSION: No definite pneumothorax following central line placement. Question pulmonary edema.   Original  Report Authenticated By: Ulyses Southward, M.D.   Ct Abdomen Pelvis W Contrast  12/31/2012   *RADIOLOGY REPORT*  Clinical Data: Generalized abdominal pain.  Diarrhea.  Surgical history includes cholecystectomy.  CT ABDOMEN AND PELVIS WITH CONTRAST  Technique:  Multidetector CT imaging of the abdomen and pelvis was performed following the standard protocol during bolus administration of intravenous contrast.  Contrast:  100 ml Omnipaque-300 IV.  Oral contrast also administered.  Comparison: CT abdomen pelvis 04/17/2011.  Findings: The entire colon is decompressed with the exception of the cecum, and the decompressed colon demonstrates enhancing mucosa and minimal wall thickening.  There is mild edema in the fat surrounding the colon.  There is no free intraperitoneal air or ascites.  Small bowel normal in caliber with the exception of mildly distended segment of mid small bowel in the pelvis which is chronic and similar to the prior examination.  Normal-appearing J- shaped stomach without evidence of hiatal hernia.  Liver normal in size with a prominent Reidel lobe.  Calcified granuloma in the liver near the dome and in the posterior left lobe.  Mild periportal edema centrally in the liver, a nonspecific finding. 0.5 cm cyst in the caudate lobe.  No other focal parenchymal abnormality involving the liver.  Normal-appearing spleen with a focus of accessory splenic tissue anterior to the lower pole of the spleen.  Moderate to marked pancreatic atrophy, unchanged.  Gallbladder surgically absent which explains the mild extrahepatic biliary ductal dilation, unchanged. The common duct can be followed to the ampulla without obstructing stone or mass.  Normal adrenal glands.  Approximate 1.0 cm calculus in the  right renal pelvis as noted previously without evidence of obstruction.  Mild enhancement of the wall of the right renal pelvis.  Mildly dilated proximal right ureter without a visible ureteral calculus.  Phleboliths in  the ovarian veins bilaterally mimic ureteral calculi.  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  No significant lymphadenopathy. Surgical clips in the retroperitoneum.  Uterus presumed surgically absent.  No adnexal masses or free pelvic fluid.  Numerous pelvic phleboliths.  Pelvic images degraded by beam hardening streak artifact from the right hip prosthesis; bone window images demonstrate osseous remodeling of the right acetabulum and lucency surrounding the femoral component of the prosthesis.  Bone window images also demonstrate severe thoracolumbar scoliosis, generalized osseous demineralization, degenerative changes involving the lower thoracic and lumbar spine, and moderate degenerative changes involving the left hip.  Scar in the visualized lung bases, unchanged.  Heart mildly enlarged.  IMPRESSION:  1.  Diffuse colitis which spares the cecum.  No evidence of free intraperitoneal air or ascites. 2.  1.0 cm calculus in the right renal pelvis without evidence of obstruction.  Enhancement wall of the right renal pelvis is likely due to chronic inflammation related to the calculus.  Please correlate with urinalysis. 3.  Chronic mild dilation of a loop of mid small bowel in the upper pelvis without evidence of small bowel obstruction currently. 4.  Stable moderate to marked pancreatic atrophy.   Original Report Authenticated By: Hulan Saas, M.D.   Dg Chest Port 1 View  12/31/2012   *RADIOLOGY REPORT*  Clinical Data: Abdominal pain.  Diarrhea.  Short of breath.  PORTABLE CHEST - 1 VIEW  Comparison: T 60,010  Findings: The patient is rotated towards the left.  There is chest deformity because of chronic scoliosis.  Heart size is probably normal.  I think there may be venous hypertension with interstitial edema.  No evidence of consolidation or major collapse.  IMPRESSION: Chest deformity due to chronic scoliosis.  Suspicion of venous hypertension with interstitial edema.   Original  Report Authenticated By: Paulina Fusi, M.D.    Anti-infectives: Anti-infectives   Start     Dose/Rate Route Frequency Ordered Stop   01/01/13 1200  vancomycin (VANCOCIN) 50 mg/mL oral solution 125 mg     125 mg Oral 4 times per day 01/01/13 1124     12/31/12 2000  ciprofloxacin (CIPRO) IVPB 400 mg  Status:  Discontinued     400 mg 200 mL/hr over 60 Minutes Intravenous Every 12 hours 12/31/12 1645 12/31/12 1713   12/31/12 2000  ciprofloxacin (CIPRO) IVPB 200 mg     200 mg 100 mL/hr over 60 Minutes Intravenous Every 12 hours 12/31/12 1713     12/31/12 1700  metroNIDAZOLE (FLAGYL) IVPB 500 mg     500 mg 100 mL/hr over 60 Minutes Intravenous Every 8 hours 12/31/12 1648     12/31/12 1615  metroNIDAZOLE (FLAGYL) IVPB 500 mg  Status:  Discontinued     500 mg 100 mL/hr over 60 Minutes Intravenous Every 8 hours 12/31/12 1600 12/31/12 1647   12/31/12 1245  piperacillin-tazobactam (ZOSYN) IVPB 3.375 g     3.375 g 12.5 mL/hr over 240 Minutes Intravenous  Once 12/31/12 1236 12/31/12 1838      Assessment/Plan: s/p * No surgery found * Abdominal pain, leukocytosis, UTI. Patient was able to 2 have a small bowel movement and currently stool specimens are being sent. Given patient's continued leukocytosis 50,000 I still highly suspect a C. difficile colitis type picture. Patient has not had any acute  surgical findings at this time. We'll continue to follow her closely and I have discussed with the patient surgical indications. Continue antibiotic coverage.  LOS: 2 days    Mildred Bollard C 01/02/2013

## 2013-01-02 NOTE — Progress Notes (Signed)
[name], [name]             ACCOUNT NO.:  [id number]  MEDICAL RECORD NO.:  [id number]  LOCATION:  IC09                          FACILITY:  [facility]  PHYSICIAN:  Dario Yono G. Renard Matter, MD   DATE OF BIRTH:  [birth year]  DATE OF PROCEDURE: DATE OF DISCHARGE:                                PROGRESS NOTE   This patient remains alert and oriented.  She is complaining more of back pain, now.  She has not had any further diarrhea or vomiting.  Her vital signs remained stable.  She was admitted with diffuse colitis, 1 cm calculus in the right renal pelvis, and urine was positive for nitrites and increased number of WBCs.  The patient has had vancomycin added to her regimen by Dr. Carolin Coy.  OBJECTIVE:  VITAL SIGNS:  Blood pressure 119/56, respirations 21, pulse 73, temp 98. HEENT:  Negative. NECK:  Supple.  No JVD or thyroid abnormalities. HEART:  Regular rhythm.  No murmurs. LUNGS:  Clear to P and A. ABDOMEN:  Distended and diffusely tender. SKIN:  Warm and dry. MUSCULOSKELETAL:  The patient has kyphosis of thoracic spine.  ASSESSMENT:  The patient was admitted with urinary tract infection, abdominal pain, secondary to colitis involving most of the large bowel. Right-sided kidney stone.  Hypotension.  PLAN:  To continue current fluids, continue current IV antibiotics. Vancomycin was added.     Martha Darley G. Renard Matter, MD     AGM/MEDQ  D:  01/02/2013  T:  01/02/2013  Job:  [id number]

## 2013-01-03 ENCOUNTER — Inpatient Hospital Stay (HOSPITAL_COMMUNITY): Payer: Medicare Other

## 2013-01-03 DIAGNOSIS — K5289 Other specified noninfective gastroenteritis and colitis: Secondary | ICD-10-CM

## 2013-01-03 LAB — HEPATIC FUNCTION PANEL
AST: 33 U/L (ref 0–37)
Albumin: 2.3 g/dL — ABNORMAL LOW (ref 3.5–5.2)
Alkaline Phosphatase: 135 U/L — ABNORMAL HIGH (ref 39–117)
Bilirubin, Direct: <0.1 mg/dL (ref 0.0–0.3)
Total Bilirubin: 0.3 mg/dL (ref 0.3–1.2)

## 2013-01-03 LAB — URINE CULTURE: Colony Count: >=100000

## 2013-01-03 LAB — CBC
Hemoglobin: 10.9 g/dL — ABNORMAL LOW (ref 12.0–15.0)
MCH: 32.7 pg (ref 26.0–34.0)
MCHC: 33 g/dL (ref 30.0–36.0)
RDW: 16.2 % — ABNORMAL HIGH (ref 11.5–15.5)

## 2013-01-03 LAB — LACTIC ACID, PLASMA: Lactic Acid, Venous: 3.1 mmol/L — ABNORMAL HIGH (ref 0.5–2.2)

## 2013-01-03 LAB — TROPONIN I: Troponin I: <0.3 ng/mL (ref <0.30)

## 2013-01-03 LAB — ALT: ALT: 101 U/L — ABNORMAL HIGH (ref 0–35)

## 2013-01-03 LAB — OCCULT BLOOD, POC DEVICE: Fecal Occult Bld: NEGATIVE

## 2013-01-03 NOTE — Progress Notes (Signed)
UR chart review completed.  

## 2013-01-03 NOTE — Plan of Care (Signed)
Problem: Phase I Progression Outcomes Goal: Pain controlled with appropriate interventions Outcome: Progressing No complaints of pain this evening Goal: OOB as tolerated unless otherwise ordered Outcome: Progressing Patient OOB to chair from 1700 to 2100, patient stated she felt lightheaded coming to chair so stedy was used to return to bed.  Patient tolerated that well Goal: Initial discharge plan identified Outcome: Progressing Patient is concerned that she will need additional help when she leaves here.   I assured her we would see that she has what she needs at discharge

## 2013-01-03 NOTE — Progress Notes (Addendum)
Subjective: Reports mild epigastric discomfort after eating jello but none with broth. No N/V. 1 "old" bloody loose stool overnight per RN, 2 loose stools yesterday. Cdiff PCR negative. Appears dyspneic.    Objective: Vital signs in last 24 hours: Temp:  [97.7 F (36.5 C)-98.2 F (36.8 C)] 98.1 F (36.7 C) (08/25 0400) Pulse Rate:  [53-115] 115 (08/25 0700) Resp:  [13-30] 28 (08/25 0700) BP: (111-185)/(56-138) 129/61 mmHg (08/25 0400) SpO2:  [95 %-100 %] 95 % (08/25 0700) Weight:  [127 lb 3.3 oz (57.7 kg)] 127 lb 3.3 oz (57.7 kg) (08/25 0500) Last BM Date: 01/02/13 General:   Alert and oriented, pleasant Head:  Normocephalic and atraumatic. Eyes:  No icterus, sclera clear. Conjuctiva pink.  Heart:  S1, S2 present, no murmurs noted.  Lungs: Tachypnea, coarse bilaterally, mild expiratory wheeze bilaterally. Use of abdominal accessory muscles Abdomen:  Bowel sounds present, soft, non-tender, non-distended. No rebound or guarding Msk:  Symmetrical without gross deformities. Normal posture. Extremities:  Without clubbing or edema.  Intake/Output from previous day: 08/24 0701 - 08/25 0700 In: 3937.5 [P.O.:350; I.V.:3087.5; IV Piggyback:500] Out: 1000 [Urine:1000] Intake/Output this shift:    Lab Results:  Recent Labs  01/01/13 0501 01/02/13 0725 01/03/13 0754  WBC 50.7* 50.7* 47.5*  HGB 10.5* 9.9* 10.9*  HCT 32.0* 30.2* 33.0*  PLT 188 177 191   BMET  Recent Labs  01/01/13 0501 01/01/13 0743 01/02/13 0725  NA 146* 148* 147*  K 4.1 4.1 3.3*  CL 117* 120* 119*  CO2 20 19 19   GLUCOSE 110* 113* 127*  BUN 16 16 13   CREATININE 0.98 0.91 0.71  CALCIUM 6.8* 6.7* 6.2*   LFT  Recent Labs  12/31/12 1150 01/01/13 0501 01/03/13 0459  PROT 5.8* 5.1*  --   ALBUMIN 2.9* 2.2*  --   AST 889* 205*  --   ALT 372* 218* 101*  ALKPHOS 78 74  --   BILITOT 1.1 0.4  --   BILIDIR  --  0.2  --   IBILI  --  0.2*  --    CDIFF NEGATIVE   Studies/Results: CT abd/pelvis 8/22 1.  Diffuse colitis which spares the cecum. No evidence of free  intraperitoneal air or ascites.  2. 1.0 cm calculus in the right renal pelvis without evidence of  obstruction. Enhancement wall of the right renal pelvis is likely  due to chronic inflammation related to the calculus. Please  correlate with urinalysis.  3. Chronic mild dilation of a loop of mid small bowel in the upper  pelvis without evidence of small bowel obstruction currently.  4. Stable moderate to marked pancreatic atrophy.  Assessment: Pleasant 79 year old female admitted with colitis and significant leukocytosis, sepsis, and elevated LFTs likely secondary to ischemic hepatitis. Cdiff PCR negative, stool culture in process. 2 loose stools overnight and 1 bloody stool overnight, with stable Hgb and very mildly improved white count although still quite impressive. On 4 liters O2 and appears to be short of breath, using accessory muscles. Her main complaint is of epigastric discomfort intermittently, and she feels like she is more short of breath than usual. Agree with chest xray ordered by attending. Will also order troponin X 3. Repeat HFP now. May ultimately need flex sig, but I would like to have respiratory status evaluated first. Would continue empiric abx for now.   Plan: Agree with chest xray. Add troponins.  Decrease IVF fluids Recheck HFP this morning Monitor for any further bloody stool; if persistent, needs flex sig but  needs respiratory status addressed first Continue empiric abx Follow-up on pending stool culture  Nira Retort, ANP-BC [facility]    LOS: 3 days    01/03/2013, 8:12 AM    Attending note: She does look better to me than she did yesterday. C. difficile came back negative, however, I continue to be suspicious. Agree with addressing fluid status.  She will be reassessed tomorrow

## 2013-01-03 NOTE — Progress Notes (Signed)
Patient has remained pleasant tonight, mild mid sternal burning,  Offered maalox, patient refused, stated it usually subsides, and she rubbed the area.  Requested tylenol for joint aches.  Assisted back to bed from chair using stedy safety chair as patient stated she was lightheaded going to recliner.  Tolerated stedy well.

## 2013-01-03 NOTE — Progress Notes (Signed)
Nurse called for PT audible exp wheezing, 1 neb given prn.

## 2013-01-03 NOTE — Care Management Note (Signed)
    Page 1 of 1   01/07/2013     1:29:45 PM   CARE MANAGEMENT NOTE 01/07/2013  Patient:  [name], [name]   Account Number:  [id number]  Date Initiated:  01/03/2013  Documentation initiated by:  Sharrie Rothman  Subjective/Objective Assessment:   Pt admitted from home with colitis. Pt lives alone and has 2 daughters that live out of state. Pt has neighbor and friends that help pt get to appts and grocery store. Pt is fairly independent with ADL's.     Action/Plan:   PT consult ordered. Will continue to follow for discharge planning needs.   Anticipated DC Date:  01/07/2013   Anticipated DC Plan:  HOME W HOME HEALTH SERVICES      DC Planning Services  CM consult      Choice offered to / List presented to:             Status of service:  Completed, signed off Medicare Important Message given?   (If response is "NO", the following Medicare IM given date fields will be blank) Date Medicare IM given:   Date Additional Medicare IM given:    Discharge Disposition:    Per UR Regulation:    If discussed at Long Length of Stay Meetings, dates discussed:   01/06/2013    Comments:  01/07/13 Rosemary Holms RN BSN CM Pt to dc to [facility] today  01/06/13 Loranzo Desha RN BSN CM CM spoke to GI. GI states that pt can be DC'd on PO AB once tolerating food. Dr. Renard Matter called at 1425 to let him know GI's plan.  01/03/13 1530 Arlyss Queen, RN BSN CM

## 2013-01-03 NOTE — Progress Notes (Signed)
  Subjective: Complaint of bloating. Positive flatus and bowel movement. She denies any significant abdominal pain. Most her discomfort is in the epigastric region. No nausea  Objective: Vital signs in last 24 hours: Temp:  [97.7 F (36.5 C)-98.2 F (36.8 C)] 97.8 F (36.6 C) (08/25 0800) Pulse Rate:  [53-150] 150 (08/25 0800) Resp:  [13-30] 21 (08/25 0800) BP: (111-185)/(56-138) 127/90 mmHg (08/25 0800) SpO2:  [90 %-100 %] 90 % (08/25 0800) Weight:  [57.7 kg (127 lb 3.3 oz)] 57.7 kg (127 lb 3.3 oz) (08/25 0500) Last BM Date: 01/02/13  Intake/Output from previous day: 08/24 0701 - 08/25 0700 In: 4312.5 [P.O.:350; I.V.:3462.5; IV Piggyback:500] Out: 1000 [Urine:1000] Intake/Output this shift: Total I/O In: 426.7 [I.V.:226.7; IV Piggyback:200] Out: -   General appearance: alert and no distress GI: Positive bowel sounds, soft, moderate distention, mild to moderate discomfort. No peritoneal signs. No masses.  Lab Results:   Recent Labs  01/02/13 0725 01/03/13 0754  WBC 50.7* 47.5*  HGB 9.9* 10.9*  HCT 30.2* 33.0*  PLT 177 191   BMET  Recent Labs  01/01/13 0743 01/02/13 0725  NA 148* 147*  K 4.1 3.3*  CL 120* 119*  CO2 19 19  GLUCOSE 113* 127*  BUN 16 13  CREATININE 0.91 0.71  CALCIUM 6.7* 6.2*   PT/INR No results found for this basename: LABPROT, INR,  in the last 72 hours ABG No results found for this basename: PHART, PCO2, PO2, HCO3,  in the last 72 hours  Studies/Results: No results found.  Anti-infectives: Anti-infectives   Start     Dose/Rate Route Frequency Ordered Stop   01/01/13 1200  vancomycin (VANCOCIN) 50 mg/mL oral solution 125 mg     125 mg Oral 4 times per day 01/01/13 1124     12/31/12 2000  ciprofloxacin (CIPRO) IVPB 400 mg  Status:  Discontinued     400 mg 200 mL/hr over 60 Minutes Intravenous Every 12 hours 12/31/12 1645 12/31/12 1713   12/31/12 2000  ciprofloxacin (CIPRO) IVPB 200 mg     200 mg 100 mL/hr over 60 Minutes  Intravenous Every 12 hours 12/31/12 1713     12/31/12 1700  metroNIDAZOLE (FLAGYL) IVPB 500 mg     500 mg 100 mL/hr over 60 Minutes Intravenous Every 8 hours 12/31/12 1648     12/31/12 1615  metroNIDAZOLE (FLAGYL) IVPB 500 mg  Status:  Discontinued     500 mg 100 mL/hr over 60 Minutes Intravenous Every 8 hours 12/31/12 1600 12/31/12 1647   12/31/12 1245  piperacillin-tazobactam (ZOSYN) IVPB 3.375 g     3.375 g 12.5 mL/hr over 240 Minutes Intravenous  Once 12/31/12 1236 12/31/12 1838      Assessment/Plan: s/p * No surgery found * Abdominal pain, leukocytosis. Continue IV antibiotic coverage. Acutely there is no immediate indications for surgical intervention however we'll continue to follow her course closely. I did note that C. difficile results are negative.  LOS: 3 days    Sindy Mccune C 01/03/2013

## 2013-01-03 NOTE — Progress Notes (Signed)
[name], ETHRIDGE             ACCOUNT NO.:  [id number]  MEDICAL RECORD NO.:  [id number]  LOCATION:  IC09                          FACILITY:  [facility]  PHYSICIAN:  Tanav Orsak G. Renard Matter, MD   DATE OF BIRTH:  17-Jul-1918  DATE OF PROCEDURE: DATE OF DISCHARGE:                                PROGRESS NOTE   SUBJECTIVE:  This patient remains alert and oriented.  She did complain some mild shortness of breath through the night and had 1 episode of bloody diarrhea.  She was admitted with diffuse colitis, 1 cm calculus in the right renal pelvis.  Urine was positive for nitrites and had increased number of WBCs.  She remains on IV vancomycin.  OBJECTIVES:  VITAL SIGNS:  Blood pressure 129/61, respirations 22, pulse 72, temp 98.1.  Her hemoglobin is 9.9, hematocrit 30.2. HEENT:  Negative. NECK:  Supple.  No JVD or thyroid abnormalities. HEART:  Regular rhythm.  No murmur. LUNGS:  Clear to P and A. ABDOMEN:  Slightly distended and tender. SKIN:  Warm and dry.  ASSESSMENT:  The patient is being treated for urinary tract infection. She has abdominal pain, secondary to colitis and has had hypotension, but blood pressure is now more stable.  PLAN:  To repeat chest x-ray, to continue vancomycin 125 mg p.o. q.6 hours, continue IV Cipro 200 mg every 12 hours, intravenous Flagyl 500 mg every 8 hours.     [name] G. Renard Matter, MD     AGM/MEDQ  D:  01/03/2013  T:  01/03/2013  Job:  [id number]

## 2013-01-04 ENCOUNTER — Encounter (HOSPITAL_COMMUNITY): Payer: Self-pay

## 2013-01-04 DIAGNOSIS — I517 Cardiomegaly: Secondary | ICD-10-CM

## 2013-01-04 LAB — CBC
HCT: 32.2 % — ABNORMAL LOW (ref 36.0–46.0)
Hemoglobin: 11 g/dL — ABNORMAL LOW (ref 12.0–15.0)
MCH: 32.5 pg (ref 26.0–34.0)
MCHC: 34.2 g/dL (ref 30.0–36.0)

## 2013-01-04 MED ORDER — FUROSEMIDE 10 MG/ML IJ SOLN
20.0000 mg | Freq: Once | INTRAMUSCULAR | Status: AC
  Administered 2013-01-04: 20 mg via INTRAVENOUS
  Filled 2013-01-04: qty 2

## 2013-01-04 MED ORDER — SACCHAROMYCES BOULARDII 250 MG PO CAPS
250.0000 mg | ORAL_CAPSULE | Freq: Two times a day (BID) | ORAL | Status: DC
  Administered 2013-01-04 – 2013-01-07 (×6): 250 mg via ORAL
  Filled 2013-01-04: qty 2
  Filled 2013-01-04 (×4): qty 1

## 2013-01-04 MED ORDER — FAMOTIDINE 20 MG PO TABS
20.0000 mg | ORAL_TABLET | Freq: Two times a day (BID) | ORAL | Status: DC
  Administered 2013-01-04 – 2013-01-07 (×7): 20 mg via ORAL
  Filled 2013-01-04 (×7): qty 1

## 2013-01-04 NOTE — Progress Notes (Signed)
Subjective: Denies N/V, complains of indigestion. Tolerating clear liquids but wants oatmeal. No loose stool overnight. Shortness of breath better.   Objective: Vital signs in last 24 hours: Temp:  [97.6 F (36.4 C)-98.4 F (36.9 C)] 97.6 F (36.4 C) (08/26 0730) Pulse Rate:  [33-109] 59 (08/25 2339) Resp:  [17-30] 20 (08/26 0400) BP: (125-133)/(46-75) 125/46 mmHg (08/26 0400) SpO2:  [95 %-100 %] 96 % (08/26 0400) Weight:  [127 lb 6.8 oz (57.8 kg)] 127 lb 6.8 oz (57.8 kg) (08/26 0500) Last BM Date: 01/02/13 General:   Alert and oriented, pleasant Head:  Normocephalic and atraumatic. Eyes:  No icterus, sclera clear. Conjuctiva pink.  Mouth:  Without lesions, mucosa pink and moist.  Neck:  Supple, without thyromegaly or masses.  Heart:  S1, S2 present, no murmurs noted.  Lungs: mild expiratory wheeze bilaterally but improved from assessment yesterday  Abdomen:  Bowel sounds present, full but soft, no rebound, guarding, or tenderness.  Extremities:  Without clubbing or edema. Neurologic:  Alert and  oriented x4;  grossly normal neurologically. Skin:  Warm and dry, intact without significant lesions.  Psych:  Alert and cooperative. Normal mood and affect.  Intake/Output from previous day: 08/25 0701 - 08/26 0700 In: 2336.7 [P.O.:360; I.V.:1476.7; IV Piggyback:500] Out: 1500 [Urine:1500] Intake/Output this shift:    Lab Results:  Recent Labs  01/02/13 0725 01/03/13 0754  WBC 50.7* 47.5*  HGB 9.9* 10.9*  HCT 30.2* 33.0*  PLT 177 191   BMET  Recent Labs  01/02/13 0725  NA 147*  K 3.3*  CL 119*  CO2 19  GLUCOSE 127*  BUN 13  CREATININE 0.71  CALCIUM 6.2*   LFT  Recent Labs  01/03/13 0459 01/03/13 0837  PROT  --  5.4*  ALBUMIN  --  2.3*  AST  --  33  ALT 101* 101*  ALKPHOS  --  135*  BILITOT  --  0.3  BILIDIR  --  <0.1  IBILI  --  NOT CALCULATED   C-Diff PCR NEGATIVE   Troponins negative X 3  Studies/Results: Dg Chest Port 1 View  01/03/2013    *RADIOLOGY REPORT*  Clinical Data: Pain  PORTABLE CHEST - 1 VIEW  Comparison: 01/01/2003  Findings: The left-sided central venous line is again noted. Cardiac shadow is stable.  Increased density is noted in the right lung base new from prior exam.  This is consistent with effusion and likely underlying atelectasis.  The left lung is clear.  IMPRESSION: New right basilar changes as described.   Original Report Authenticated By: Alcide Clever, M.D.    Assessment: Pleasant 79 year old female admitted with colitis and significant leukocytosis, sepsis, and elevated LFTs likely secondary to ischemic hepatitis. Cdiff PCR negative, stool culture in process but without growth thus far. No further loose stools, and she appears clinically improved from yesterday. Respiratory status improved with decreasing IV fluids and addition of Lasix. Mild complaints of indigestion; will steer clear of PPI for now due to possible Cdiff. Add Pepcid oral BID.   Although Cdiff PCR is negative, her presentation is still quite convincing for this scenario. For now, will be able to hold off on flex sig as she is clinically improving with supportive care and antibiotics. I would continue Vancomycin for now, as she is improving.   Plan: Recheck CBC today HFP every other day, follow to baseline (next 8/27) Full liquids now; advance to low-residue diet as tolerated Pepcid BID Hold off on flex sig at this point; outpatient evaluation once  current illness resolves  Will continue to follow with you  Nira Retort, ANP-BC [facility]      LOS: 4 days    01/04/2013, 8:29 AM

## 2013-01-04 NOTE — Progress Notes (Signed)
[name], [name]             ACCOUNT NO.:  [id number]  MEDICAL RECORD NO.:  [id number]  LOCATION:  IC09                          FACILITY:  [facility]  PHYSICIAN:  Tomisha Reppucci G. Renard Matter, MD   DATE OF BIRTH:  1919/03/25  DATE OF PROCEDURE: DATE OF DISCHARGE:                                PROGRESS NOTE   SUBJECTIVE:  This patient remains alert and oriented.  She does have diffuse colitis, 1-cm calculus in the right renal pelvis, and urinary tract infection.  She remains on vancomycin.  OBJECTIVE:  VITAL SIGNS:  Blood pressure 125/46, respirations 20, pulse 59, temperature 97.7. HEENT:  Eyes, PERRLA.  TM negative.  Oropharynx benign. NECK:  Supple.  No JVD or thyroid abnormalities. HEART:  Regular rhythm.  No murmurs. LUNGS:  Clear to P and A. ABDOMEN:  Slightly tender. SKIN:  Warm and dry.  The patient does have kyphosis of her spine.  ASSESSMENT:  The patient is being treated for colitis, urinary tract infection.  PLAN:  To continue current IV Cipro, Flagyl, and p.o. vancomycin.  The patient's urine did culture E. Coli, sensitive to ciprofloxacin.     Konya Fauble G. Renard Matter, MD     AGM/MEDQ  D:  01/04/2013  T:  01/04/2013  Job:  [id number]

## 2013-01-04 NOTE — Clinical Social Work Placement (Signed)
    Clinical Social Work Department CLINICAL SOCIAL WORK PLACEMENT NOTE 01/07/2013  Patient:  [name], [name]  Account Number:  [id number] Admit date:  12/31/2012  Clinical Social Worker:  Santa Genera, CLINICAL SOCIAL WORKER  Date/time:  01/04/2013 12:30 PM  Clinical Social Work is seeking post-discharge placement for this patient at the following level of care:   SKILLED NURSING   (*CSW will update this form in Epic as items are completed)   01/04/2013  Patient/family provided with [facility] Department of Clinical Social Work's list of facilities offering this level of care within the geographic area requested by the patient (or if unable, by the patient's family).  01/04/2013  Patient/family informed of their freedom to choose among providers that offer the needed level of care, that participate in Medicare, Medicaid or managed care program needed by the patient, have an available bed and are willing to accept the patient.  01/04/2013  Patient/family informed of [facility]' ownership interest in [facility], as well as of the fact that they are under no obligation to receive care at this facility.  PASARR submitted to EDS on 01/04/2013 PASARR number received from EDS on 01/04/2013  FL2 transmitted to all facilities in geographic area requested by pt/family on  01/04/2013 FL2 transmitted to all facilities within larger geographic area on   Patient informed that his/her managed care company has contracts with or will negotiate with  certain facilities, including the following:     Patient/family informed of bed offers received:  01/06/2013 Patient chooses bed at [facility] Physician recommends and patient chooses bed at    Patient to be transferred to [facility] on  01/07/2013 Patient to be transferred to facility by [facility] staff will transport via tunnel  The following physician request were entered in Epic:   Additional Comments: Patient  had preexisting PASARR. Patient requests SNF make appt w Rankin MD to continue care for her eye condition.  SNF admissions informed.  Santa Genera, LCSW Clinical Social Worker [phone])

## 2013-01-04 NOTE — Progress Notes (Signed)
*  PRELIMINARY RESULTS* Echocardiogram 2D Echocardiogram has been performed.  [name] 01/04/2013, 9:57 AM

## 2013-01-04 NOTE — Clinical Social Work Psychosocial (Signed)
    Clinical Social Work Department BRIEF PSYCHOSOCIAL ASSESSMENT 01/04/2013  Patient:  [name], [name]     Account Number:  [id number]     Admit date:  12/31/2012  Clinical Social Worker:  Santa Genera, CLINICAL SOCIAL WORKER  Date/Time:  01/04/2013 12:00 N  Referred by:  CSW  Date Referred:  01/04/2013 Referred for  SNF Placement   Other Referral:   Interview type:  Patient Other interview type:    PSYCHOSOCIAL DATA Living Status:  ALONE Admitted from facility:   Level of care:   Primary support name:  Rosanne Ashing Primary support relationship to patient:  CHILD, ADULT Degree of support available:   Limited, patient lives alone, children live in [place], husband deceased    CURRENT CONCERNS Current Concerns  Post-Acute Placement   Other Concerns:    SOCIAL WORK ASSESSMENT / PLAN CSW met w patient at bedside, patient alert and oriented x4. Patient is delightful 79 year old widow, living independently without assistance until current episode of illness.  Her children live in [place], until last year she drove to her cottage on [address].  Was able to take care of all ADLs, including driving and shopping.  Makes all her own decisions, will consult w children if needed.  Patient has lived in [place] 32 years, husband was Solicitor who was transferred to this area and then died one year after the move.  Patient has some social support in the area, but does not have any family nearby.    Discussed SNF w patient - her desire is to return home at discharge.  She does understand that "I dont know how I will feel, this all so sudden."  Patient willing to have CSW seek SNF bed offers as option and then be able to assess her ability to be at home at discharge.  Does not want placement at [facility], has prior negative experience there.  Prefers to stay in [place], thus prefers [facility].   Assessment/plan status:  Psychosocial Support/Ongoing Assessment of  Needs Other assessment/ plan:   Information/referral to community resources:   SNF list    PATIENT'S/FAMILY'S RESPONSE TO PLAN OF CARE: Patient willing to have CSW seek bed offers, but prefers to return home at discharge.        Santa Genera, LCSW Clinical Social Worker [phone])

## 2013-01-04 NOTE — Evaluation (Signed)
Physical Therapy Evaluation Patient Details Name: [name] MRN: [id number] DOB: 07/28/1918 Today's Date: 01/04/2013 Time: 4132-4401 PT Time Calculation (min): 34 min  PT Assessment / Plan / Recommendation History of Present Illness   Pt is admitted with colitis.  She lives alone and had been independent with an assistive device.  Clinical Impression  Pt was seen for evaluation and found to be generally weak and deconditioned.  PTA, she used a cane or walker for gait.  Currently, she needs minimal assistance to transfer bed to [facility] and felt lightheaded with sitting.  She needed to sit on the commode, so we were unable to try to assess gait, but my strong recommendation is for her to go to SNF at d/c.  Pt is very resistant to this and insists she will go home.  At home, she will need full time assistance.    PT Assessment  Patient needs continued PT services    Follow Up Recommendations  SNF    Does the patient have the potential to tolerate intense rehabilitation    no  Barriers to Discharge Decreased caregiver support pt lives alone    Equipment Recommendations  None recommended by PT    Recommendations for Other Services OT consult   Frequency Min 3X/week    Precautions / Restrictions Precautions Precautions: Fall Restrictions Weight Bearing Restrictions: No   Pertinent Vitals/Pain       Mobility  Bed Mobility Bed Mobility: Supine to Sit Supine to Sit: 4: Min assist;HOB elevated Transfers Transfers: Sit to Stand;Stand to Sit;Stand Pivot Transfers Sit to Stand: 4: Min assist;With upper extremity assist;From bed Stand to Sit: 4: Min assist;With upper extremity assist;To chair/3-in-1 Stand Pivot Transfers: 4: Min assist Ambulation/Gait Ambulation/Gait Assistance: Not tested (comment)    Exercises General Exercises - Lower Extremity Ankle Circles/Pumps: AROM;Both;5 reps;Supine Heel Slides: AAROM;Both;5 reps;Supine Hip ABduction/ADduction: AAROM;Both;5  reps;Supine   PT Diagnosis: Difficulty walking;Generalized weakness  PT Problem List: Decreased strength;Decreased activity tolerance;Decreased mobility PT Treatment Interventions: Gait training;Functional mobility training;Therapeutic exercise;Patient/family education     PT Goals(Current goals can be found in the care plan section) Acute Rehab PT Goals Patient Stated Goal: wants to return home...feels that she may have to get someone into the house to assist her, but when I suggested that it would need to be full time help she said "Oh, I can't do that". PT Goal Formulation: With patient Time For Goal Achievement: 02/18/13 Potential to Achieve Goals: Good  Visit Information  Last PT Received On: 01/04/13       Prior Functioning  Home Living Family/patient expects to be discharged to:: Skilled nursing facility Prior Function Level of Independence: Independent with assistive device(s) Communication Communication: [facility]    Cognition  Cognition Arousal/Alertness: Awake/alert Behavior During Therapy: WFL for tasks assessed/performed Overall Cognitive Status: Within Functional Limits for tasks assessed    Extremity/Trunk Assessment Upper Extremity Assessment Upper Extremity Assessment: Generalized weakness Lower Extremity Assessment Lower Extremity Assessment: Generalized weakness Cervical / Trunk Assessment Cervical / Trunk Assessment: Kyphotic;Other exceptions Cervical / Trunk Exceptions: severe thoracolumbar scoliosis   Balance Balance Balance Assessed: No  End of Session PT - End of Session Equipment Utilized During Treatment: Gait belt Activity Tolerance: Patient tolerated treatment well Patient left: Other (comment) (pt on [facility] with aide in room)  GP     Myrlene Broker L 01/04/2013, 11:46 AM

## 2013-01-04 NOTE — Progress Notes (Signed)
PT CLINICALLY IMPROVED AFTER ADDING PO VANC. PT MOST LIKELY HAS C DIFF PCR NEG INFECTION. STOP CIPRO. ADD PROBIOTIC. COMPLETE 14 DAYS OF FLAGYL AND VANCOMYCIN.

## 2013-01-04 NOTE — Progress Notes (Signed)
  Subjective: Comfortable.  no   Objective: Vital signs in last 24 hours: Temp:  [97.6 F (36.4 C)-98.4 F (36.9 C)] 98 F (36.7 C) (08/26 1938) Pulse Rate:  [33-176] 176 (08/26 1600) Resp:  [17-23] 19 (08/26 1600) BP: (100-144)/(43-98) 134/43 mmHg (08/26 1600) SpO2:  [81 %-100 %] 95 % (08/26 1600) Weight:  [57.8 kg (127 lb 6.8 oz)] 57.8 kg (127 lb 6.8 oz) (08/26 0500) Last BM Date: 01/02/13  Intake/Output from previous day: 08/25 0701 - 08/26 0700 In: 2386.7 [P.O.:360; I.V.:1526.7; IV Piggyback:500] Out: 1500 [Urine:1500] Intake/Output this shift:    General appearance: alert and no distress GI: soft, non-tender; bowel sounds normal; no masses,  no organomegaly  Lab Results:   Recent Labs  01/03/13 0754 01/04/13 0947  WBC 47.5* 23.5*  HGB 10.9* 11.0*  HCT 33.0* 32.2*  PLT 191 186   BMET  Recent Labs  01/02/13 0725  NA 147*  K 3.3*  CL 119*  CO2 19  GLUCOSE 127*  BUN 13  CREATININE 0.71  CALCIUM 6.2*   PT/INR No results found for this basename: LABPROT, INR,  in the last 72 hours ABG No results found for this basename: PHART, PCO2, PO2, HCO3,  in the last 72 hours  Studies/Results: Dg Chest Port 1 View  01/03/2013   *RADIOLOGY REPORT*  Clinical Data: Pain  PORTABLE CHEST - 1 VIEW  Comparison: 01/01/2003  Findings: The left-sided central venous line is again noted. Cardiac shadow is stable.  Increased density is noted in the right lung base new from prior exam.  This is consistent with effusion and likely underlying atelectasis.  The left lung is clear.  IMPRESSION: New right basilar changes as described.   Original Report Authenticated By: Alcide Clever, M.D.    Anti-infectives: Anti-infectives   Start     Dose/Rate Route Frequency Ordered Stop   01/01/13 1200  vancomycin (VANCOCIN) 50 mg/mL oral solution 125 mg     125 mg Oral 4 times per day 01/01/13 1124     12/31/12 2000  ciprofloxacin (CIPRO) IVPB 400 mg  Status:  Discontinued     400 mg 200  mL/hr over 60 Minutes Intravenous Every 12 hours 12/31/12 1645 12/31/12 1713   12/31/12 2000  ciprofloxacin (CIPRO) IVPB 200 mg  Status:  Discontinued     200 mg 100 mL/hr over 60 Minutes Intravenous Every 12 hours 12/31/12 1713 01/04/13 1214   12/31/12 1700  metroNIDAZOLE (FLAGYL) IVPB 500 mg     500 mg 100 mL/hr over 60 Minutes Intravenous Every 8 hours 12/31/12 1648     12/31/12 1615  metroNIDAZOLE (FLAGYL) IVPB 500 mg  Status:  Discontinued     500 mg 100 mL/hr over 60 Minutes Intravenous Every 8 hours 12/31/12 1600 12/31/12 1647   12/31/12 1245  piperacillin-tazobactam (ZOSYN) IVPB 3.375 g     3.375 g 12.5 mL/hr over 240 Minutes Intravenous  Once 12/31/12 1236 12/31/12 1838      Assessment/Plan: s/p * No surgery found * Leukocystosis improving.  No acute surgical findings.  Responding to abx.  Will follow more peripherally.  LOS: 4 days    Khamya Topp C 01/04/2013

## 2013-01-05 DIAGNOSIS — R001 Bradycardia, unspecified: Secondary | ICD-10-CM

## 2013-01-05 DIAGNOSIS — I498 Other specified cardiac arrhythmias: Secondary | ICD-10-CM

## 2013-01-05 LAB — CBC
HCT: 32.9 % — ABNORMAL LOW (ref 36.0–46.0)
MCH: 32.1 pg (ref 26.0–34.0)
MCV: 94.3 fL (ref 78.0–100.0)
Platelets: 177 10*3/uL (ref 150–400)
RBC: 3.49 MIL/uL — ABNORMAL LOW (ref 3.87–5.11)
RDW: 15.2 % (ref 11.5–15.5)

## 2013-01-05 LAB — HEPATIC FUNCTION PANEL
ALT: 59 U/L — ABNORMAL HIGH (ref 0–35)
Bilirubin, Direct: 0.2 mg/dL (ref 0.0–0.3)
Indirect Bilirubin: 0.3 mg/dL (ref 0.3–0.9)
Total Bilirubin: 0.5 mg/dL (ref 0.3–1.2)

## 2013-01-05 LAB — CULTURE, BLOOD (ROUTINE X 2): Culture: NO GROWTH

## 2013-01-05 MED ORDER — LORAZEPAM 2 MG/ML IJ SOLN
0.2500 mg | Freq: Four times a day (QID) | INTRAMUSCULAR | Status: DC | PRN
  Administered 2013-01-05 – 2013-01-06 (×2): 0.25 mg via INTRAVENOUS
  Filled 2013-01-05: qty 1

## 2013-01-05 MED ORDER — LORAZEPAM 2 MG/ML IJ SOLN
INTRAMUSCULAR | Status: AC
  Administered 2013-01-05: 0.25 mg via INTRAVENOUS
  Filled 2013-01-05: qty 1

## 2013-01-05 MED ORDER — LORAZEPAM 2 MG/ML IJ SOLN: 0.5000 mg | Freq: Four times a day (QID) | INTRAMUSCULAR | Status: DC | PRN

## 2013-01-05 NOTE — Progress Notes (Signed)
Pt has had several instances of bradycardia HR in 30s  Dr Sudie Bailey contacted via phone  Cardiology consult ordered

## 2013-01-05 NOTE — Consult Note (Signed)
Reason for Consult:bradycardia Referring Physician: PTH  [name] is an 79 y.o. female.  HPI: This is a 79 year old female patient who has no prior cardiac history and was admitted with colitis with significant leukocytosis, sepsis, and elevated LFTs secondary to ischemic hepatitis. 2-D echo was performed on 01/04/13 that showed an ejection fraction of 55-60% with mild LVH and grade 1 diastolic dysfunction. The left atrium was moderately dilated.  Today while the patient was up with physical therapy she became very sick as her vomiting. The nurse was called that her heart rate dipped down into the 30s. This was all in the setting of vomiting. When she settled back down in bed her heart rate came up into the 70s. Baseline is between 50 and 75, but she does have several documented heart rates down in the 30s. Patient denies any heart cardiac history. She says she has occasional gas and belching in her chest but denies any chest pressure, tightness, dyspnea, dizziness or syncope. She says she'll get out of breath if she goes out into the heat and humidity. She was fairly active prior to admission.  Past Medical History  Diagnosis Date  . Cancer     uterine surg only  . Arthritis   . Kidney stone   . Pancreatic atrophy 12/31/2012  . Colitis 12/31/2012   Past Surgical History  Procedure Laterality Date  . Joint replacement    . Cholecystectomy    . Right hip surgery    . Left knee surgery and revision     Family History  Problem Relation Age of Onset  . Cancer Mother    Social History:  reports that she quit smoking about 61 years ago. Her smoking use included Cigarettes. She smoked 0.00 packs per day. She has never used smokeless tobacco. She reports that she drinks about 0.6 ounces of alcohol per week. She reports that she does not use illicit drugs.  Allergies:  Allergies  Allergen Reactions  . Sulfa Antibiotics   . Aspirin Other (See Comments)    Makes stomach burn    Medications:  Scheduled Meds: . famotidine  20 mg Oral BID  . hydrocortisone sod succinate (SOLU-CORTEF) inj  100 mg Intravenous Q6H  . metronidazole  500 mg Intravenous Q8H  . saccharomyces boulardii  250 mg Oral BID  . sodium chloride  10-40 mL Intracatheter Q12H  . vancomycin  125 mg Oral Q6H   Continuous Infusions: . 0.9 % NaCl with KCl 20 mEq / L 50 mL/hr at 01/05/13 0900   PRN Meds:.acetaminophen, acetaminophen, albuterol, alum & mag hydroxide-simeth, LORazepam, morphine injection, ondansetron, sodium chloride  Results for orders placed during the hospital encounter of 12/31/12 (from the past 48 hour(s))  TROPONIN I     Status: None   Collection Time    01/03/13  2:21 PM      Result Value Range   Troponin I <0.30  <0.30 ng/mL    01/05/13  9:14 AM      Result Value Range   WBC 17.2 (*) 4.0 - 10.5 K/uL   RBC 3.49 (*) 3.87 - 5.11 MIL/uL   Hemoglobin 11.2 (*) 12.0 - 15.0 g/dL   HCT 16.1 (*) 09.6 - 04.5 %   MCV 94.3  78.0 - 100.0 fL   MCH 32.1  26.0 - 34.0 pg   MCHC 34.0  30.0 - 36.0 g/dL   RDW 40.9  81.1 - 91.4 %   Platelets 177  150 - 400 K/uL  HEPATIC  FUNCTION PANEL     Status: Abnormal   Collection Time    01/05/13  9:14 AM      Result Value Range   Total Protein 5.2 (*) 6.0 - 8.3 g/dL   Albumin 2.4 (*) 3.5 - 5.2 g/dL   AST 18  0 - 37 U/L   ALT 59 (*) 0 - 35 U/L   Alkaline Phosphatase 69  39 - 117 U/L   Total Bilirubin 0.5  0.3 - 1.2 mg/dL   Bilirubin, Direct 0.2  0.0 - 0.3 mg/dL   Indirect Bilirubin 0.3  0.3 - 0.9 mg/dL  ROS See HPI Eyes: Negative Ears: Hard of hearing Cardiovascular: Short of breath only when she goes outside in the humidity, Negative for chest pain, palpitations,irregular heartbeat,  near-syncope, orthopnea, paroxysmal nocturnal dyspnea and syncope,edema, claudication, cyanosis,.  Respiratory:   Negative for cough, hemoptysis, shortness of breath, sleep disturbances due to breathing, sputum production and wheezing.   Endocrine: Negative  for cold intolerance and heat intolerance.  Hematologic/Lymphatic: Negative for adenopathy and bleeding problem. Does not bruise/bleed easily.  Musculoskeletal: Negative.   Gastrointestinal: Positive for nausea, vomiting, reflux, abdominal pain, diarrhea,    Neurological: Negative.  Allergic/Immunologic: Negative for environmental allergies.  Blood pressure 144/75, pulse 59, temperature 98.3 F (36.8 C), temperature source Oral, resp. rate 16, height 4\' 6"  (1.372 m), weight 127 lb 6.8 oz (57.8 kg), SpO2 98.00%. Physical Exam PHYSICAL EXAM: Elderly, nauseated, in no acute distress.  Markedly impaired hearing acuity. Neck: No JVD, HJR, Bruit, or thyroid enlargement Lungs: Decreased breath sounds but No tachypnea, clear without wheezing, rales, or rhonchi Cardiovascular: RRR, PMI not displaced, 1/6 systolic murmur at the apex, no gallops, bruit, thrill, or heave. Abdomen: BS normal. Soft without organomegaly, masses, lesions or tenderness. Extremities: without cyanosis, clubbing or edema. Good distal pulses bilateral SKin: Warm, no lesions or rashes  Musculoskeletal: No deformities Neuro: no focal signs  2Decho 01/04/13: Mild LVH; nl EF; moderate LAE; no significant valvular abnormalities   Assessment/Plan: Bradycardia: Heart rates in the 30s when standing with physical therapy and began vomiting. Probably multifactorial related to her acute illness, sepsis, and vasovagal. Currently her heart rate is in the 70s. Hopefully it will continue to improve as her illness improves. Patient is not on any rate lowering medications. Continue to monitor. Normal LV function: Mild LVH and grade 1 diastolic dysfunction on 2-D echo Colitis with sepsis Ischemic hepatitis Right kidney stone UTI  [name] 01/05/2013, 1:16 PM   Cardiology Attending Patient interviewed and examined. Discussed with Martha Reedy, PA-C.  Above note annotated and modified based upon my findings.  Sinus bradycardia noted  during episodes of emesis without hypotension or apparent cerebral hypoperfusion.  Frequent atrial ectopy in the setting of increased adrenergic tone related to acute illness.  Neurocardiogenic mechanism likely contributing to bradycardia. Should there be a recurrence, particularly if associated with symptoms, atropine can be administered. Patient has not apparently received any medications likely to contribute to bradycardia.  No other testing or treatment is warranted at present.  Dermott Bing, MD 01/05/2013, 5:24 PM

## 2013-01-05 NOTE — Progress Notes (Signed)
NAMENOELY, KUHNLE             ACCOUNT NO.:  [id number]  MEDICAL RECORD NO.:  [id number]  LOCATION:  A333                          FACILITY:  [facility]  PHYSICIAN:  Shawntae Lowy G. Renard Matter, MD   DATE OF BIRTH:  04-22-1919  DATE OF PROCEDURE: DATE OF DISCHARGE:                                PROGRESS NOTE   SUBJECTIVE:  This patient had confusion throughout the night, but continues to improve with reference to her GI symptoms.  She has had no further diarrhea or bloody stools.  Apparently was thought to have ischemic hepatitis and did have leukocytosis, which is improving as well.  OBJECTIVE:  VITAL SIGNS:  Blood pressure 124/60, respirations 14, pulse 54, temp 97.9. HEENT:  Eyes PERRLA.  TM negative.  Oropharynx benign. NECK:  Supple.  No JVD or thyroid abnormalities. HEART:  Regular rhythm.  No murmurs. LUNGS:  Clear to P and A. ABDOMEN:  Slight tenderness over upper abdomen.  ASSESSMENT:  The patient continues to be treated for colitis, urinary tract infection, and possible ischemic hepatitis.  The patient was mentally confused earlier in the night.  PLAN:  To continue her IV Cipro, Flagyl, and vancomycin.  The patient did have E. coli in urine.  We will move the patient from ICU.  Continue current regimen.     Wesleigh Markovic G. Renard Matter, MD     AGM/MEDQ  D:  01/05/2013  T:  01/05/2013  Job:  [id number]

## 2013-01-05 NOTE — Progress Notes (Signed)
Pt has become increasingly more confused throughout shift.  Pulled out foley cath, attempting to get of of bed, cursing at staff, insisting she was at a [employer]. Unable to reorient. md notified and new orders received and noted

## 2013-01-05 NOTE — Progress Notes (Signed)
Subjective: Confused overnight and this morning, pulling at BP cuff. Denies abdominal pain. Per nursing, 1 stool yesterday blood-tinged.   Objective: Vital signs in last 24 hours: Temp:  [97 F (36.1 C)-98.1 F (36.7 C)] 97 F (36.1 C) (08/26 2315) Pulse Rate:  [32-176] 35 (08/27 0500) Resp:  [11-20] 13 (08/27 0500) BP: (100-147)/(43-98) 110/87 mmHg (08/27 0400) SpO2:  [81 %-99 %] 94 % (08/27 0500) General:   Alert to person only, confused Head:  Normocephalic and atraumatic. Eyes:  No icterus, sclera clear. Conjuctiva pink.  Heart:  S1, S2 present, no murmurs noted.  Lungs: Clear to auscultation bilaterally, without wheezing, rales, or rhonchi.  Abdomen:  Bowel sounds present, soft, non-tender, non-distended. No rebound or guarding.  Extremities:  Without edema. Neurologic:  Oriented to person only.    Intake/Output from previous day: 08/26 0701 - 08/27 0700 In: 1990 [P.O.:480; I.V.:1110; IV Piggyback:400] Out: 3600 [Urine:3600] Intake/Output this shift:    Lab Results:  Recent Labs  01/03/13 0754 01/04/13 0947  WBC 47.5* 23.5*  HGB 10.9* 11.0*  HCT 33.0* 32.2*  PLT 191 186   LFT  Recent Labs  01/03/13 0459 01/03/13 0837  PROT  --  5.4*  ALBUMIN  --  2.3*  AST  --  33  ALT 101* 101*  ALKPHOS  --  135*  BILITOT  --  0.3  BILIDIR  --  <0.1  IBILI  --  NOT CALCULATED   C-Diff PCR Negative   Studies/Results: Dg Chest Port 1 View  01/03/2013   *RADIOLOGY REPORT*  Clinical Data: Pain  PORTABLE CHEST - 1 VIEW  Comparison: 01/01/2003  Findings: The left-sided central venous line is again noted. Cardiac shadow is stable.  Increased density is noted in the right lung base new from prior exam.  This is consistent with effusion and likely underlying atelectasis.  The left lung is clear.  IMPRESSION: New right basilar changes as described.   Original Report Authenticated By: Alcide Clever, M.D.    Assessment: Pleasant 79 year old female admitted with colitis and  significant leukocytosis, sepsis, and elevated LFTs likely secondary to ischemic hepatitis. Cdiff PCR negative, stool culture with abundant yeast but without growth thus far. 1 loose stool, blood-tinged yesterday. Otherwise, she is clinically improving from a GI standpoint overall with marked improvement in leukocytosis from admission.   Although Cdiff PCR is negative, her presentation is still quite convincing for this scenario. For now, will be able to hold off on flex sig as she is clinically improving with supportive care and antibiotics.   Confusion likely multifactorial; she is to be transferred to the floor today, which should be helpful.    Plan: Check HFP every other day; due now CBC Continue Vanc and Flagyl for total of 14 days, Cipro discontinued Probiotic X 3 months Consider outpatient evaluation via colonoscopy once current illness resolved Start low-residue diet  Nira Retort, ANP-BC [facility]     LOS: 5 days    01/05/2013, 7:52 AM

## 2013-01-05 NOTE — Clinical Social Work Note (Signed)
Patient has bed offers at [facility] and [facility].  When patient is oriented and awake, patient will be given bed offers.  Santa Genera, LCSW Clinical Social Worker [phone])

## 2013-01-05 NOTE — Progress Notes (Signed)
Pt resting quietly at this time, vss.

## 2013-01-05 NOTE — Progress Notes (Signed)
Physical Therapy Treatment Patient Details Name: [name] MRN: [id number] DOB: August 09, 1918 Today's Date: 01/05/2013 Time: 0940-1010 PT Time Calculation (min): 30 min  PT Assessment / Plan / Recommendation     PT Comments   Pt states she is tired and nauseated.  Pt reports willingness to work with therapist today.  Completed therex in supine position and transferred to edge of bed with min assist.  Pt became nauseated and only wanted to transfer to recliner.  Once transferred, noticed pt had soiled herself.  Pt began to vomit clear fluid and requested transferring back to bed.  Nursing assistant made aware of need to clean patient and provide with new linens.  Pt positioned in bed with mod asssist.                     Progress towards PT Goals Progress towards PT goals: Progressing toward goals  Plan Current plan remains appropriate    Precautions / Restrictions Precautions Precautions: Fall       Mobility  Bed Mobility Bed Mobility: Supine to Sit Supine to Sit: 4: Min assist Transfers Transfers: Sit to Stand;Stand Pivot Transfers Sit to Stand: 4: Min assist Ambulation/Gait Ambulation/Gait Assistance: Not tested (comment)    Exercises General Exercises - Lower Extremity Ankle Circles/Pumps: AROM;Both;10 reps Heel Slides: AROM;Both;10 reps Hip ABduction/ADduction: AROM;Both;10 reps Straight Leg Raises: AAROM;Both;10 reps    PT Goals (current goals can now be found in the care plan section)    Visit Information  Last PT Received On: 01/05/13       Cognition  Cognition Arousal/Alertness: Awake/alert Behavior During Therapy: [facility] for tasks assessed/performed Overall Cognitive Status: Within Functional Limits for tasks assessed       End of Session PT - End of Session Equipment Utilized During Treatment: Gait belt Activity Tolerance: Treatment limited secondary to medical complications (Comment) Patient left: in bed;with call bell/phone within reach;with bed  alarm set Nurse Communication: Mobility status;Other (comment)       Lurena Nida, PTA/CLT 01/05/2013, 10:11 AM

## 2013-01-05 NOTE — Progress Notes (Signed)
Agree 

## 2013-01-06 DIAGNOSIS — K5289 Other specified noninfective gastroenteritis and colitis: Secondary | ICD-10-CM

## 2013-01-06 LAB — STOOL CULTURE

## 2013-01-06 NOTE — Progress Notes (Signed)
Physical Therapy Treatment Patient Details Name: [name] MRN: [id number] DOB: [birth year] Today's Date: 01/06/2013 Time: 7829-5621 PT Time Calculation (min): 39 min  PT Assessment / Plan / Recommendation  History of Present Illness     PT Comments   Pt states that she feels as if she is floating in a dream.  Her O2 sat remained in the mid 90s and HR in the 50s to 60s throughout tx.  She was able to tolerate gentle therapeutic exercise in the bed.  She had some lightheadedness upon sitting, but this resolved within a couple of minutes.  She was able to take a few steps with the walker, but was unstable.  She has become extremely weak and deconditioned and will definitely need SNF at d/c.  Follow Up Recommendations        Does the patient have the potential to tolerate intense rehabilitation     Barriers to Discharge        Equipment Recommendations       Recommendations for Other Services    Frequency     Progress towards PT Goals Progress towards PT goals: Progressing toward goals  Plan Current plan remains appropriate    Precautions / Restrictions     Pertinent Vitals/Pain     Mobility  Bed Mobility Supine to Sit: 4: Min assist Transfers Sit to Stand: 4: Min assist;With upper extremity assist;From bed Stand to Sit: 4: Min assist;With upper extremity assist;To chair/3-in-1 Ambulation/Gait Ambulation/Gait Assistance: 4: Min assist Ambulation Distance (Feet): 2 Feet Assistive device: Rolling walker Gait Pattern: Trunk flexed;Shuffle Gait velocity: very slow and labored General Gait Details: some instability due to generalized weakness and deconditioning. Stairs: No Wheelchair Mobility Wheelchair Mobility: No    Exercises General Exercises - Lower Extremity Ankle Circles/Pumps: AROM;Both;10 reps;Supine Short Arc Quad: AROM;Both;10 reps;Supine Heel Slides: AAROM;Both;10 reps;Supine Hip ABduction/ADduction: AAROM;Both;10 reps;Supine   PT Diagnosis:    PT  Problem List:   PT Treatment Interventions:     PT Goals (current goals can now be found in the care plan section)    Visit Information  Last PT Received On: 01/06/13    Subjective Data      Cognition       Balance     End of Session PT - End of Session Equipment Utilized During Treatment: Gait belt Activity Tolerance: Patient tolerated treatment well Patient left: in chair;with call bell/phone within reach Nurse Communication: Mobility status   GP     [name] 01/06/2013, 12:16 PM

## 2013-01-06 NOTE — Progress Notes (Signed)
[name], [name]             ACCOUNT NO.:  [id number]  MEDICAL RECORD NO.:  [id number]  LOCATION:  A333                          FACILITY:  [facility]  PHYSICIAN:  Donavyn Fecher G. Renard Matter, MD   DATE OF BIRTH:  Mar 17, 1919  DATE OF PROCEDURE: DATE OF DISCHARGE:                                PROGRESS NOTE   SUBJECTIVE:  This patient had episodes of vomiting yesterday with bradycardia.  She was seen in consultation by Cardiology Service.  It was felt to be sinus bradycardia during episodes of emesis without hypotension.  No other testing was needed.  The patient is alert and oriented this morning.  OBJECTIVE:  VITAL SIGNS:  Blood pressure 144/75, respirations 18, pulse 86, temp 98.3. HEENT:  Eyes, PERRLA.  TMs negative.  Oral oropharynx benign. NECK:  Supple.  No JVD or thyroid abnormalities. HEART:  Regular rhythm.  No murmurs. LUNGS:  Clear to P and A. ABDOMEN:  No palpable organs or masses.  ASSESSMENT:  The patient has been treated for colitis, possible Clostridium difficile, urinary tract infection and ischemic hepatitis. She had episodes of sinus bradycardia yesterday but is more normal today.  PLAN:  To continue her IV Cipro, Flagyl, and vancomycin p.o.  It is possible the patient could be transitioned to nursing facility over the weekend.     Michole Lecuyer G. Renard Matter, MD     AGM/MEDQ  D:  01/06/2013  T:  01/06/2013  Job:  [id number]

## 2013-01-06 NOTE — Progress Notes (Signed)
Spoke with patient and with daughter via phone- SNF bed offers given and the preference is [facility]-  CSW updated [facility] and will f/u tomorrow for possible d/c.    Reece Levy, MSW [phone]

## 2013-01-06 NOTE — Progress Notes (Signed)
Subjective:  Patient has been confused yesterday. Conversant this morning. Denies pain. Spoke with RN, Arline Asp. Patient has small stool during the night per report. Two in the last 24 hours. Not eating well. Ate 5% of dinner yesterday. Dry heaves yesterday with standing and associated with bradycardia.  Objective: Vital signs in last 24 hours: Temp:  [97.7 F (36.5 C)-98.3 F (36.8 C)] 97.7 F (36.5 C) (08/28 0500) Pulse Rate:  [54-86] 66 (08/28 0500) Resp:  [14-18] 16 (08/28 0500) BP: (123-144)/(60-75) 123/70 mmHg (08/28 0500) SpO2:  [92 %-98 %] 96 % (08/28 0500) Weight:  [123 lb 0.3 oz (55.8 kg)] 123 lb 0.3 oz (55.8 kg) (08/28 0500) Last BM Date: 01/04/13 General:   Alert,  pleasant and cooperative in NAD Head:  Normocephalic and atraumatic. Eyes:  Sclera clear, no icterus.   Abdomen:  Soft, nontender and nondistended.   Normal bowel sounds, without guarding, and without rebound.   Extremities:  Without clubbing, deformity. 2+Pitting edema to knees bilaterally. Neurologic:  Alert and  oriented to person,place.  grossly normal neurologically. Skin:  Intact without significant lesions or rashes. Psych:  Alert and cooperative. Normal mood and affect.  Intake/Output from previous day: 08/27 0701 - 08/28 0700 In: 440 [P.O.:240; I.V.:100; IV Piggyback:100] Out: 225 [Urine:225] Intake/Output this shift:    Lab Results: CBC  Recent Labs  01/03/13 0754 01/04/13 0947 01/05/13 0914  WBC 47.5* 23.5* 17.2*  HGB 10.9* 11.0* 11.2*  HCT 33.0* 32.2* 32.9*  MCV 99.1 95.3 94.3  PLT 191 186 177   BMET No results found for this basename: NA, K, CL, CO2, GLUCOSE, BUN, CREATININE, CALCIUM,  in the last 72 hours LFTs  Recent Labs  01/03/13 0837 01/05/13 0914  BILITOT 0.3 0.5  BILIDIR <0.1 0.2  IBILI NOT CALCULATED 0.3  ALKPHOS 135* 69  AST 33 18  ALT 101* 59*  PROT 5.4* 5.2*  ALBUMIN 2.3* 2.4*   No results found for this basename: LIPASE,  in the last 72 hours PT/INR No  results found for this basename: LABPROT, INR,  in the last 72 hours    Imaging Studies:   Ct Abdomen Pelvis W Contrast  12/31/2012   *RADIOLOGY REPORT*  Clinical Data: Generalized abdominal pain.  Diarrhea.  Surgical history includes cholecystectomy.  CT ABDOMEN AND PELVIS WITH CONTRAST  Technique:  Multidetector CT imaging of the abdomen and pelvis was performed following the standard protocol during bolus administration of intravenous contrast.  Contrast:  100 ml Omnipaque-300 IV.  Oral contrast also administered.  Comparison: CT abdomen pelvis 04/17/2011.  Findings: The entire colon is decompressed with the exception of the cecum, and the decompressed colon demonstrates enhancing mucosa and minimal wall thickening.  There is mild edema in the fat surrounding the colon.  There is no free intraperitoneal air or ascites.  Small bowel normal in caliber with the exception of mildly distended segment of mid small bowel in the pelvis which is chronic and similar to the prior examination.  Normal-appearing J- shaped stomach without evidence of hiatal hernia.  Liver normal in size with a prominent Reidel lobe.  Calcified granuloma in the liver near the dome and in the posterior left lobe.  Mild periportal edema centrally in the liver, a nonspecific finding. 0.5 cm cyst in the caudate lobe.  No other focal parenchymal abnormality involving the liver.  Normal-appearing spleen with a focus of accessory splenic tissue anterior to the lower pole of the spleen.  Moderate to marked pancreatic atrophy, unchanged.  Gallbladder surgically  absent which explains the mild extrahepatic biliary ductal dilation, unchanged. The common duct can be followed to the ampulla without obstructing stone or mass.  Normal adrenal glands.  Approximate 1.0 cm calculus in the right renal pelvis as noted previously without evidence of obstruction.  Mild enhancement of the wall of the right renal pelvis.  Mildly dilated proximal right ureter  without a visible ureteral calculus.  Phleboliths in the ovarian veins bilaterally mimic ureteral calculi.  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  No significant lymphadenopathy. Surgical clips in the retroperitoneum.  Uterus presumed surgically absent.  No adnexal masses or free pelvic fluid.  Numerous pelvic phleboliths.  Pelvic images degraded by beam hardening streak artifact from the right hip prosthesis; bone window images demonstrate osseous remodeling of the right acetabulum and lucency surrounding the femoral component of the prosthesis.  Bone window images also demonstrate severe thoracolumbar scoliosis, generalized osseous demineralization, degenerative changes involving the lower thoracic and lumbar spine, and moderate degenerative changes involving the left hip.  Scar in the visualized lung bases, unchanged.  Heart mildly enlarged.  IMPRESSION:  1.  Diffuse colitis which spares the cecum.  No evidence of free intraperitoneal air or ascites. 2.  1.0 cm calculus in the right renal pelvis without evidence of obstruction.  Enhancement wall of the right renal pelvis is likely due to chronic inflammation related to the calculus.  Please correlate with urinalysis. 3.  Chronic mild dilation of a loop of mid small bowel in the upper pelvis without evidence of small bowel obstruction currently. 4.  Stable moderate to marked pancreatic atrophy.   Original Report Authenticated By: Hulan Saas, M.D.    Assessment:  Pleasant 79 year old female admitted with colitis and significant leukocytosis, sepsis, and elevated LFTs likely secondary to ischemic hepatitis. Cdiff PCR negative, stool culture with abundant yeast but without growth thus far. Presentation felt to still be quite convincing for C.Diff.  Otherwise, she is clinically improving from a GI standpoint overall with marked improvement in leukocytosis from admission. LFTs nearly normal. Her oral intake is poor. Need to see  improvement.   Plan: 1. As before. Continue Vanc and Flagyl for 14 days. She is on oral vancomycin. Switch flagyl to oral when tolerating more food. She will not need to be discharged on IV Flagyl. Discussed with case management per request of Dr. Renard Matter. Probiotic x 3 months.   LOS: 6 days   Tana Coast  01/06/2013, 7:43 AM

## 2013-01-07 ENCOUNTER — Inpatient Hospital Stay
Admission: RE | Admit: 2013-01-07 | Discharge: 2013-02-23 | Disposition: A | Discharge status: Home / Self Care | Payer: Medicare Other | Source: Ambulatory Visit | Attending: Family Medicine | Admitting: Family Medicine

## 2013-01-07 NOTE — Progress Notes (Signed)
Report called to [facility] at [facility]. Pt has no questions at this time. [facility] d/c without complications. Gauze dressing applied. [name] is aware that Central line was pulled. Pt transported to [facility] via wheelchair by NT. Sheryn Bison

## 2013-01-07 NOTE — Clinical Social Work Note (Signed)
Patient ready for discharge today, will transfer to [facility] by [facility] staff via tunnel.  Discharge summary faxed to facility via [facility], discharge packet prepared and placed w shadow chart for transport.  Patient and facility agreeable to admission t SNF today.  Patient requested CSW alert [facility] to need for follow up for eye condition w [place], MD in [place] where she receives weekly injections for eye condition she does not know name of.  [facility] informed.  Per neighbor in room, daughters are driving from [place] and will arrive within the week.  CSW signing off as no further SW needs identified.  Santa Genera, LCSW Clinical Social Worker [phone])

## 2013-01-07 NOTE — Discharge Summary (Signed)
[name], [name]             ACCOUNT NO.:  [id number]  MEDICAL RECORD NO.:  [id number]  LOCATION:  A333                          FACILITY:  [facility]  PHYSICIAN:  Sibley Rolison G. Renard Matter, MD   DATE OF BIRTH:  03-03-1919  DATE OF ADMISSION:  12/31/2012 DATE OF DISCHARGE:  08/29/2014LH                              DISCHARGE SUMMARY   DIAGNOSES:  Diffuse colitis possibly secondary to Clostridium difficile, leukocytosis, sepsis, urinary tract infection, hypotension, lactic acidosis, ischemic hepatitis, grade 1 diastolic dysfunction.  CONDITION:  Stable and improved at the time of discharge.  HISTORY:  This patient presented to the emergency room with a complaint of abdominal pain, diarrhea, episodes of vomiting, and right lower quadrant pain radiating to the epigastrium and back.  Pain was of moderate intensity.  She has had urinary incontinence.  She was hypotensive in the emergency department with blood pressure 77/52, was given IV fluids.  Blood pressure improved.  CT of the abdomen on admission revealed diffuse colitis which spared cecum, right renal pelvis 1-cm calculus, mild dilatation of loops of bowel.  Elevated liver enzymes and lactic acid 375.  Leukocytosis.  Urinalysis was positive for nitrites and many bacteria.  She was admitted for further care.  PHYSICAL EXAMINATION:  VITAL SIGNS:  On admission, blood pressure 90/50. GENERAL:  The patient was alert and oriented. HEENT:  Eyes, PERRLA.  TM, negative.  Oropharynx, benign. NECK:  Supple.  No JVD or thyroid abnormalities. HEART:  Tachycardia. LUNGS:  Clear to P and A. ABDOMEN:  Protuberant, moderately diffuse, tenderness more on the left lower quadrant, questionable small ventral hernia on right. SKIN:  No edema. EXTREMITIES:  The patient does have arthritic changes in her left knee and back.  LABORATORY DATA:  Admission CBC:  WBC 23.9, hemoglobin 12.7, hematocrit 39.7.  AST 889, ALT 372, alkaline phosphatase 78, bilirubin  1.1, protein 5.8, albumin 2.9.  Subsequent labs towards the latter part of my hospitalization.  CBC:  WBC 17,200 with hemoglobin 11.2, hematocrit 32.9.  Blood culture negative.  Urine culture yeast.  RADIOLOGY:  Chest x-ray on admission, chest deformity due to chronic scoliosis.  Subsequent chest x-ray, new right basilar changes consistent with effusion and underlying atelectasis.  CT of abdomen, diffuse colitis sparing the cecum, 1.0 cm calculus in right renal pelvis without evidence of obstruction.  Chronic mild dilatation of mid small bowel loops without evidence of obstruction.  Stable but moderate pancreatic atrophy.  HOSPITAL COURSE:  The patient was placed in ICU initially, was given intravenous fluids, mainly 0.9% normal saline with KCl 20 mEq.  Was started on vancomycin 125 mg p.o. every 6 hours, Solu-Cortef 100 mg every 6 hours, Pepcid 20 mg b.i.d.  She was also given Zofran 4 mg IV p.r.n. for nausea, Proventil nebulizer solution 2.5 mg every 2 hours p.r.n., and occasional Ativan 0.25 mg every 6 hours p.r.n. for restlessness.  The patient was seen in consultation by Gastroenterology Service who felt that C. diff was negative, presentation is quite convincing that this was a case of considered flexible sigmoidoscopy because the patient did have several bloody stools, but it was felt that this did not need to be done.  Subsequently, they recommended  continuing vancomycin p.o., a left-sided central venous line was placed.  The patient slowly, but progressively improved during her stay.  She did have episodes of nausea, vomiting, diarrhea, and altered mental status towards the latter part of hospital stay in ICU.  She was moved to the medical floor towards the latter part of hospitalization and her mental confusion cleared.  Her WBC count decreased, search for nursing home bed was started, this was achieved.  The patient did symptomatically improve towards the latter part of the  hospital stay.     Drue Camera G. Renard Matter, MD     AGM/MEDQ  D:  01/07/2013  T:  01/07/2013  Job:  [id number]

## 2013-01-08 NOTE — Progress Notes (Signed)
[name], [name]             ACCOUNT NO.:  [id number]  MEDICAL RECORD NO.:  [id number]  LOCATION:  A333                          FACILITY:  [facility]  PHYSICIAN:  Jarah Pember G. Renard Matter, MD   DATE OF BIRTH:  January 15, 1919  DATE OF PROCEDURE:  01/07/2013 DATE OF DISCHARGE:  01/07/2013                                PROGRESS NOTE   ADDENDUM/CONTINUATION  This patient does have additional diagnosis of E coli UTI.  She is to continue the following medications; 1. Acetaminophen 500 mg p.o. every 6 hours as needed for pain. 2. Aleve 220 mg twice a day. 3. Calcium carbonate 1250 mg daily, this is known as Os-Cal. 4. Flexeril 5 mg t.i.d. 5. Prednisone 10 mg daily. 6. Vitamin E 100 units daily. 7. The patient will be on vancomycin and Flagyl for a total of 15     days, also probiotic for 3 months. Vancomycin dose 125 mg every 6     hours. Flagyl 500 mg every 8 hours. Florastor capsule 250 mg b.i.d.     Nani Ingram G. Renard Matter, MD     AGM/MEDQ  D:  01/07/2013  T:  01/08/2013  Job:  [id number]

## 2013-01-13 NOTE — Progress Notes (Signed)
[name], [name]             ACCOUNT NO.:  [id number]  MEDICAL RECORD NO.:  [id number]  LOCATION:                                 FACILITY:  PHYSICIAN:  Saran Laviolette G. Renard Matter, MD   DATE OF BIRTH:  1919-02-10  DATE OF PROCEDURE:  01/12/2013 DATE OF DISCHARGE:                                PROGRESS NOTE   This patient was seen in Nursing Facility [facility].  This patient has standing diagnoses, recent diffuse colitis probable secondary to Clostridium difficile, recent UTI, hypotension, lactic acidosis, ischemic hepatitis, and grade 1 diastolic dysfunction.  She has noted increased edema in her legs bilaterally over the past few days along with erythema in distal extremities.  EXAMINATION:  GENERAL:  The patient is alert, oriented, extremely hard of hearing. VITAL SIGNS:  Blood pressure 150/55, pulse 61, temp 99, O2 sat is 94%. LUNGS:  Clear to P and A. HEART:  Regular rhythm. ABDOMEN:  No palpable organs or masses. EXTREMITIES:  Bilateral edema and erythema of both lower extremities, 2 to 3+ pitting edema.  ASSESSMENT:  The patient has above diagnosis as well as edema in lower extremities.  PLAN:  To start patient on furosemide 20 mg b.i.d., elevation of legs. If edema subsides, we will start TED hose.  Doxycycline 100 mg b.i.d. for approximately 1 week.  Dictation ended at this point.     Javonne Dorko G. Renard Matter, MD     AGM/MEDQ  D:  01/12/2013  T:  01/13/2013  Job:  [id number]

## 2013-01-20 NOTE — Progress Notes (Signed)
[name], [name]             ACCOUNT NO.:  [id number]  MEDICAL RECORD NO.:  [id number]  LOCATION:                                 FACILITY:  PHYSICIAN:  Karinna Beadles G. Renard Matter, MD   DATE OF BIRTH:  [birth year]  DATE OF PROCEDURE:  01/19/2013 DATE OF DISCHARGE:                                PROGRESS NOTE   SUBJECTIVE:  This patient is alert.  She has been treated with furosemide and potassium supplement for the last few days because of edema in extremities.  This has gotten much better.  The patient appears to be better and more alert.  She did have hypokalemia and this normalized with p.o. potassium.  OBJECTIVE:  GENERAL:  Alert patient, hard of hearing. VITAL SIGNS:  BP 107/70, respirations 19, pulse 87, temperature 98. LUNGS:  Clear to P and A. HEART:  Regular rhythm. ABDOMEN:  No palpable organs or masses. MUSCULOSKELETAL:  The patient has kyphotic spine. EXTREMITIES:  She still has some peripheral edema, 2+ in the lower extremities.  PLAN:  To increase furosemide to 30 mg daily with supplementary potassium and repeat BMET in a few days.  Continue doxycycline 100 mg b.i.d., elevation, TED hose.     Rashon Westrup G. Renard Matter, MD     AGM/MEDQ  D:  01/19/2013  T:  01/19/2013  Job:  [id number]

## 2013-01-27 ENCOUNTER — Telehealth: Payer: Self-pay | Admitting: Gastroenterology

## 2013-01-27 NOTE — Telephone Encounter (Signed)
Per Dr. Jena Gauss,  Offer this patient E30 hospital follow up visit for diarrhea.

## 2013-01-31 ENCOUNTER — Encounter: Payer: Self-pay | Admitting: Internal Medicine

## 2013-01-31 NOTE — Telephone Encounter (Signed)
Pt is aware of OV on 10/15 at 230 with [facility] and appt card was mailed

## 2013-02-23 ENCOUNTER — Encounter: Payer: Self-pay | Admitting: Gastroenterology

## 2013-02-23 ENCOUNTER — Ambulatory Visit (INDEPENDENT_AMBULATORY_CARE_PROVIDER_SITE_OTHER): Payer: Medicare Other | Admitting: Gastroenterology

## 2013-02-23 VITALS — BP 108/58 | HR 75 | Temp 98.8°F | Ht <= 58 in | Wt 97.0 lb

## 2013-02-23 DIAGNOSIS — K5900 Constipation, unspecified: Secondary | ICD-10-CM

## 2013-02-23 DIAGNOSIS — K5909 Other constipation: Secondary | ICD-10-CM | POA: Insufficient documentation

## 2013-02-23 DIAGNOSIS — K529 Noninfective gastroenteritis and colitis, unspecified: Secondary | ICD-10-CM

## 2013-02-23 DIAGNOSIS — K5289 Other specified noninfective gastroenteritis and colitis: Secondary | ICD-10-CM

## 2013-02-23 NOTE — Patient Instructions (Signed)
1. Please call us if you develop diarrhea that lasts for more than 24 hours. 2. Please continue fiber supplements as needed for constipation. If you need additional help, try 1/2 to 1 capful of miralax daily as needed. You can buy over the counter. 3. If you require antibiotic therapy in the future, you should eat two Dannon yogurts daily while on therapy. 4. Office visit as needed.

## 2013-02-23 NOTE — Progress Notes (Signed)
Primary Care Physician: Alice Reichert, MD  Primary Gastroenterologist:  Roetta Sessions, MD   Chief Complaint  Patient presents with  . Follow-up    HPI: [name] is a 79 y.o. female here for followup her recent hospitalization. Presented to [facility] hospital back in August with sepsis-like picture, ischemic hepatitis. It was felt that she had C. difficile colitis even with negative PCR. CT showed diffuse colitis. She was treated with vancomycin and Flagyl and rapidly improved. She has been doing very well. She's back to her baseline constipation. Has a bowel movement about every other day. Manages with daily Benefiber and occasional women's laxative. Denies any blood in the stool, abdominal pain, vomiting. Her appetite has improved. She continues to have some fatigue but has come a long way. She was discharged from the [facility] center today and is heading home. Heartburn well-controlled.   No current outpatient prescriptions on file.   No current facility-administered medications for this visit.    Allergies as of 02/23/2013 - Review Complete 02/23/2013  Allergen Reaction Noted  . Sulfa antibiotics  12/31/2012  . Aspirin Other (See Comments) 04/25/2011    ROS:  General: Negative for anorexia, weight loss, fever, chills,weakness. Positive fatigue ENT: Negative for hoarseness, difficulty swallowing , nasal congestion. CV: Negative for chest pain, angina, palpitations, dyspnea on exertion, peripheral edema.  Respiratory: Negative for dyspnea at rest, dyspnea on exertion, cough, sputum, wheezing.  GI: See history of present illness. GU:  Negative for dysuria, hematuria, urinary incontinence, urinary frequency, nocturnal urination.  Endo: Negative for unusual weight change.    Physical Examination:   BP 108/58  Pulse 75  Temp(Src) 98.8 F (37.1 C) (Oral)  Ht 4\' 6"  (1.372 m)  Wt 97 lb (43.999 kg)  BMI 23.37 kg/m2  General: Well-nourished, well-developed in no acute  distress. Accompanied by daughter. Eyes: No icterus. Mouth: Oropharyngeal mucosa moist and pink , no lesions erythema or exudate. Lungs: Clear to auscultation bilaterally.  Heart: Regular rate and rhythm, no murmurs rubs or gallops.  Abdomen: Bowel sounds are normal, nontender, nondistended, no hepatosplenomegaly or masses, no abdominal bruits or hernia , no rebound or guarding.   Extremities: No lower extremity edema. No clubbing or deformities. Neuro: Alert and oriented x 4   Skin: Warm and dry, no jaundice.   Psych: Alert and cooperative, normal mood and affect.  Labs:  Lab Results  Component Value Date   WBC 17.2* 01/05/2013   HGB 11.2* 01/05/2013   HCT 32.9* 01/05/2013   MCV 94.3 01/05/2013   PLT 177 01/05/2013   Lab Results  Component Value Date   ALT 59* 01/05/2013   AST 18 01/05/2013   ALKPHOS 69 01/05/2013   BILITOT 0.5 01/05/2013   Lab Results  Component Value Date   CREATININE 0.71 01/02/2013   BUN 13 01/02/2013   NA 147* 01/02/2013   K 3.3* 01/02/2013   CL 119* 01/02/2013   CO2 19 01/02/2013    Imaging Studies: No results found.

## 2013-02-23 NOTE — Assessment & Plan Note (Signed)
Doing well. Back to her baseline constipation. She is not interested in pursuing a colonoscopy. She has never had a colonoscopy. Advised her to take a probiotic or consume 2 yogurts daily if she requires additional antibiotic therapy in the future. She is to call us if she has diarrhea that lasted for more than 24 hours. Otherwise office visit as needed only.

## 2013-02-24 NOTE — Progress Notes (Signed)
cc'd to pcp 

## 2013-03-02 ENCOUNTER — Other Ambulatory Visit: Payer: Self-pay | Admitting: Internal Medicine

## 2013-03-02 ENCOUNTER — Encounter: Payer: Self-pay | Admitting: Gastroenterology

## 2013-03-02 ENCOUNTER — Encounter (INDEPENDENT_AMBULATORY_CARE_PROVIDER_SITE_OTHER): Payer: Self-pay

## 2013-03-02 ENCOUNTER — Encounter (HOSPITAL_COMMUNITY): Payer: Self-pay | Admitting: Pharmacy Technician

## 2013-03-02 ENCOUNTER — Ambulatory Visit (INDEPENDENT_AMBULATORY_CARE_PROVIDER_SITE_OTHER): Payer: Medicare Other | Admitting: Gastroenterology

## 2013-03-02 ENCOUNTER — Other Ambulatory Visit: Payer: Self-pay | Admitting: Gastroenterology

## 2013-03-02 VITALS — BP 116/78 | HR 116 | Temp 97.1°F | Ht <= 58 in | Wt 93.6 lb

## 2013-03-02 DIAGNOSIS — K921 Melena: Secondary | ICD-10-CM

## 2013-03-02 LAB — CBC WITH DIFFERENTIAL/PLATELET
Basophils Absolute: 0 10*3/uL (ref 0.0–0.1)
Basophils Relative: 0 % (ref 0–1)
Eosinophils Absolute: 0 10*3/uL (ref 0.0–0.7)
Hemoglobin: 9.2 g/dL — ABNORMAL LOW (ref 12.0–15.0)
MCHC: 31.7 g/dL (ref 30.0–36.0)
Monocytes Relative: 5 % (ref 3–12)
Neutro Abs: 21 10*3/uL — ABNORMAL HIGH (ref 1.7–7.7)
Neutrophils Relative %: 87 % — ABNORMAL HIGH (ref 43–77)
Platelets: 246 10*3/uL (ref 150–400)

## 2013-03-02 NOTE — Progress Notes (Signed)
Primary Care Physician:  Alice Reichert, MD  Primary Gastroenterologist:  Roetta Sessions, MD   Chief Complaint  Patient presents with  . Rectal Bleeding  . Melena    HPI:  [name] is a 79 y.o. female here for urgent OV. Saw PCP yesterday for single episode of melena. Heme + per her report. Hgb 13 on fingerprick yesterday. States she had one episode of black stool yesterday. No diarrhea. Single episode of vomiting Monday. Appetite slowly returning from recent hospitalization for sepsis and colitis in 12/2012. Just seen by me last week and was doing well. Denies abdominal pain. No brbpr. Started pantoprazole yesterday after seeing Dr. Renard Matter. Held Aleve today. She denies lightheadedness, abdominal pain, SOB, weakness.  Current Outpatient Prescriptions  Medication Sig Dispense Refill  . acetaminophen (TYLENOL) 500 MG tablet Take 500 mg by mouth every 6 (six) hours as needed for pain.      . calcium carbonate (OS-CAL - DOSED IN MG OF ELEMENTAL CALCIUM) 1250 MG tablet Take 1 tablet by mouth daily with breakfast.      . cyclobenzaprine (FLEXERIL) 5 MG tablet Take 5 mg by mouth 3 (three) times daily as needed for muscle spasms.      . furosemide (LASIX) 20 MG tablet Take 20 mg by mouth. 40 mg in am 20 mg in pm      . naproxen sodium (ALEVE) 220 MG tablet Take 220 mg by mouth 2 (two) times daily with a meal.      . pantoprazole (PROTONIX) 40 MG tablet Take 40 mg by mouth daily.      . potassium chloride (K-DUR,KLOR-CON) 10 MEQ tablet Take 10 mEq by mouth 2 (two) times daily.      . predniSONE (DELTASONE) 10 MG tablet Take 10 mg by mouth daily.      . sertraline (ZOLOFT) 50 MG tablet Take 50 mg by mouth daily.      . vitamin E 100 UNIT capsule Take 100 Units by mouth daily.       No current facility-administered medications for this visit.    Allergies as of 03/02/2013 - Review Complete 03/02/2013  Allergen Reaction Noted  . Sulfa antibiotics  12/31/2012  . Aspirin Other (See  Comments) 04/25/2011    Past Medical History  Diagnosis Date  . Cancer     uterine surg only  . Arthritis   . Kidney stone   . Pancreatic atrophy 12/31/2012  . Colitis 12/31/2012    Past Surgical History  Procedure Laterality Date  . Joint replacement    . Cholecystectomy    . Right hip surgery    . Left knee surgery and revision      Family History  Problem Relation Age of Onset  . Cancer Mother     History   Social History  . Marital Status: Widowed    Spouse Name: N/A    Number of Children: N/A  . Years of Education: N/A   Occupational History  . Not on file.   Social History Main Topics  . Smoking status: Former Smoker    Types: Cigarettes    Quit date: 04/25/1951  . Smokeless tobacco: Never Used  . Alcohol Use: 0.6 oz/week    1 Glasses of wine per week     Comment: 1 glass of wine a day.  . Drug Use: No  . Sexual Activity: No   Other Topics Concern  . Not on file   Social History Narrative  . No narrative on  file      ROS:  General: Negative for anorexia, weight loss, fever, chills, fatigue, weakness. Eyes: Negative for vision changes.  ENT: Negative for hoarseness, difficulty swallowing , nasal congestion. CV: Negative for chest pain, angina, palpitations, dyspnea on exertion, peripheral edema.  Respiratory: Negative for dyspnea at rest, dyspnea on exertion, cough, sputum, wheezing.  GI: See history of present illness. GU:  Negative for dysuria, hematuria, urinary incontinence, urinary frequency, nocturnal urination.  MS: Negative for joint pain, low back pain.  Derm: Negative for rash or itching.  Neuro: Negative for weakness, abnormal sensation, seizure, frequent headaches, memory loss, confusion.  Psych: Negative for anxiety, depression, suicidal ideation, hallucinations.  Endo: Negative for unusual weight change.  Heme: Negative for bruising or bleeding. Allergy: Negative for rash or hives.    Physical Examination:  BP 116/78  Pulse  116  Temp(Src) 97.1 F (36.2 C) (Oral)  Ht 4\' 6"  (1.372 m)  Wt 93 lb 9.6 oz (42.457 kg)  BMI 22.55 kg/m2   General: Well-nourished, well-developed in no acute distress.  Head: Normocephalic, atraumatic.   Eyes: Conjunctiva pink, no icterus. Mouth: Oropharyngeal mucosa moist and pink , no lesions erythema or exudate. Neck: Supple without thyromegaly, masses, or lymphadenopathy.  Lungs: Clear to auscultation bilaterally.  Heart: Regular rhythm, no murmurs rubs or gallops. Slight tachycardia. Abdomen: Bowel sounds are normal, nontender, nondistended, no hepatosplenomegaly or masses, no abdominal bruits or    hernia , no rebound or guarding.   Rectal: black hard stool, heme + Extremities: Trace lower extremity edema. No clubbing or deformities.  Neuro: Alert and oriented x 4 , grossly normal neurologically.  Skin: Warm and dry, no rash or jaundice.   Psych: Alert and cooperative, normal mood and affect.

## 2013-03-02 NOTE — Progress Notes (Signed)
Quick Note:  Patient's daughter notified of results. Discussed with Dr. Jena Gauss. EGD tomorrow. Daughter given instructions to arrive at 9am and dietary instructions.   We need STAT CBC/diff as soon as patient arrives at endo tomorrow. Please arrange. ______

## 2013-03-02 NOTE — Patient Instructions (Addendum)
1. Please go for labs at [facility] now. Once your results are back we will call with further instructions.  2. Stop Aleve. 3. Increase pantoprazole to 40mg  BID. 4. Upper endoscopy tomorrow. 5. If you have difficulty breathing, lightheadedness, further black or bloody stools, go to ER.

## 2013-03-02 NOTE — Assessment & Plan Note (Signed)
79 y/o with acute onset melena yesterday. Melena, strongly heme+, on exam today. Clinically patient appears stable. Discussed with Dr. Jena Gauss. Will check STAT CBC today. Plan for EGD tomorrow. Stop Aleve. Increase PPI to BID. If she has any change in symptoms, warning symptoms discussed, she will go to ER.  EGD tomorrow.  I have discussed the risks, alternatives, benefits with regards to but not limited to the risk of reaction to medication, bleeding, infection, perforation and the patient is agreeable to proceed. Written consent to be obtained.

## 2013-03-03 ENCOUNTER — Other Ambulatory Visit: Payer: Self-pay

## 2013-03-03 ENCOUNTER — Encounter (HOSPITAL_COMMUNITY): Payer: Self-pay | Admitting: *Deleted

## 2013-03-03 ENCOUNTER — Ambulatory Visit (HOSPITAL_COMMUNITY)
Admission: RE | Admit: 2013-03-03 | Discharge: 2013-03-03 | Disposition: A | Discharge status: Home / Self Care | Payer: Medicare Other | Source: Ambulatory Visit | Attending: Internal Medicine | Admitting: Internal Medicine

## 2013-03-03 ENCOUNTER — Encounter (HOSPITAL_COMMUNITY)
Admission: RE | Disposition: A | Discharge status: Home / Self Care | Payer: Self-pay | Source: Ambulatory Visit | Attending: Internal Medicine

## 2013-03-03 DIAGNOSIS — K921 Melena: Secondary | ICD-10-CM

## 2013-03-03 DIAGNOSIS — K259 Gastric ulcer, unspecified as acute or chronic, without hemorrhage or perforation: Secondary | ICD-10-CM

## 2013-03-03 DIAGNOSIS — K222 Esophageal obstruction: Secondary | ICD-10-CM | POA: Insufficient documentation

## 2013-03-03 DIAGNOSIS — R195 Other fecal abnormalities: Secondary | ICD-10-CM

## 2013-03-03 DIAGNOSIS — K449 Diaphragmatic hernia without obstruction or gangrene: Secondary | ICD-10-CM | POA: Insufficient documentation

## 2013-03-03 HISTORY — PX: ESOPHAGOGASTRODUODENOSCOPY: SHX5428

## 2013-03-03 LAB — DIFFERENTIAL
Basophils Absolute: 0 10*3/uL (ref 0.0–0.1)
Basophils Relative: 0 % (ref 0–1)
Basophils Relative: 0 % (ref 0–1)
Eosinophils Absolute: 0.1 10*3/uL (ref 0.0–0.7)
Eosinophils Relative: 1 % (ref 0–5)
Eosinophils Relative: 1 % (ref 0–5)
Lymphocytes Relative: 19 % (ref 12–46)
Lymphocytes Relative: 18 % (ref 12–46)
Monocytes Absolute: 1.6 10*3/uL — ABNORMAL HIGH (ref 0.1–1.0)
Monocytes Absolute: 1.8 10*3/uL — ABNORMAL HIGH (ref 0.1–1.0)
Monocytes Relative: 9 % (ref 3–12)
Monocytes Relative: 10 % (ref 3–12)
Neutro Abs: 12.5 10*3/uL — ABNORMAL HIGH (ref 1.7–7.7)
Neutrophils Relative %: 71 % (ref 43–77)

## 2013-03-03 LAB — CBC
HCT: 25.6 % — ABNORMAL LOW (ref 36.0–46.0)
Hemoglobin: 8.4 g/dL — ABNORMAL LOW (ref 12.0–15.0)
MCH: 32.6 pg (ref 26.0–34.0)
MCHC: 32.8 g/dL (ref 30.0–36.0)
Platelets: 207 10*3/uL (ref 150–400)
RDW: 15.1 % (ref 11.5–15.5)

## 2013-03-03 SURGERY — EGD (ESOPHAGOGASTRODUODENOSCOPY)
Anesthesia: Moderate Sedation

## 2013-03-03 MED ORDER — ONDANSETRON HCL 4 MG/2ML IJ SOLN
INTRAMUSCULAR | Status: DC | PRN
  Administered 2013-03-03: 4 mg via INTRAVENOUS

## 2013-03-03 MED ORDER — ONDANSETRON HCL 4 MG/2ML IJ SOLN
INTRAMUSCULAR | Status: AC
  Filled 2013-03-03: qty 2

## 2013-03-03 MED ORDER — MIDAZOLAM HCL 5 MG/5ML IJ SOLN
INTRAMUSCULAR | Status: DC | PRN
  Administered 2013-03-03 (×3): 1 mg via INTRAVENOUS

## 2013-03-03 MED ORDER — STERILE WATER FOR IRRIGATION IR SOLN
Status: DC | PRN
  Administered 2013-03-03: 10:00:00

## 2013-03-03 MED ORDER — MEPERIDINE HCL 100 MG/ML IJ SOLN
INTRAMUSCULAR | Status: AC
  Filled 2013-03-03: qty 1

## 2013-03-03 MED ORDER — MIDAZOLAM HCL 5 MG/5ML IJ SOLN
INTRAMUSCULAR | Status: AC
  Filled 2013-03-03: qty 10

## 2013-03-03 MED ORDER — SODIUM CHLORIDE 0.9 % IV SOLN
INTRAVENOUS | Status: DC
  Administered 2013-03-03: 1000 mL via INTRAVENOUS

## 2013-03-03 MED ORDER — MEPERIDINE HCL 100 MG/ML IJ SOLN
INTRAMUSCULAR | Status: DC | PRN
  Administered 2013-03-03 (×2): 25 mg via INTRAVENOUS

## 2013-03-03 MED ORDER — BUTAMBEN-TETRACAINE-BENZOCAINE 2-2-14 % EX AERO
INHALATION_SPRAY | CUTANEOUS | Status: DC | PRN
  Administered 2013-03-03: 2 via TOPICAL

## 2013-03-03 NOTE — Op Note (Signed)
[facility] [address] [place], [address]   ENDOSCOPY PROCEDURE REPORT  PATIENT: Martha, [name]  MR#: [id number] BIRTHDATE: 1918-08-07 , 89  yrs. old GENDER: Female ENDOSCOPIST: R.  Roetta Sessions, MD FACP FACG REFERRED BY:  Butch Penny, M.D. PROCEDURE DATE:  03/03/2013 PROCEDURE:     Diagnostic EGD  INDICATIONS:   Recent melena; Hemoccult positive stool. Declining hemoglobin. In 6s  INFORMED CONSENT:   The risks, benefits, limitations, alternatives and imponderables have been discussed.  The potential for biopsy, esophogeal dilation, etc. have also been reviewed.  Questions have been answered.  All parties agreeable.  Please see the history and physical in the medical record for more information.  MEDICATIONS: Versed 3 mg IV and Demerol 50 mg IV in divided doses. Cetacaine spray. Zofran 4 mg IV  DESCRIPTION OF PROCEDURE:   The EG-[id number] ([id number])  endoscope was introduced through the mouth and advanced to the second portion of the duodenum without difficulty or limitations.  The mucosal surfaces were surveyed very carefully during advancement of the scope and upon withdrawal.  Retroflexion view of the proximal stomach and esophagogastric junction was performed.      FINDINGS: Noncritical Schatzki's ring; otherwise, normal esophagus. Stomach empty. Tiny hiatal hernia. Patient had a 1.2 cm elliptical deep, prepyloric ulcer with a clean base. No bleeding stigmata. The remainder of the gastric because of her normal. Patent pylorus. Normal first and second portion of the duodenum.  THERAPEUTIC / DIAGNOSTIC MANEUVERS PERFORMED:  None   COMPLICATIONS:  None  IMPRESSION:    Significant gastric ulcer without bleeding stigmata - requiring no endoscopic intervention today. Noncritical Schatzki's ring. Small hiatal hernia. Patient without melena for 2 days. She has no orthostatic symptoms. She's had normal vital signs throughout her stay here today.  I believe she can continue to be managed as an outpatient.  RECOMMENDATIONS:  Absolutely refrain from taking all forms of NSAIDs. Discussed with patient's daughter. Increase protonix to 40 mg twice daily.  One-month course of Iron sulfate 325 mg twice daily (family warned about this medication turning stools dark).  Check H. pylori serologies today. Repeat CBC the first of next week.  Office visit in 3 months for repeat EGD.    _______________________________ R. Roetta Sessions, MD FACP [facility] eSigned:  R. Roetta Sessions, MD FACP [facility] 03/03/2013 10:44 AM     CC:  PATIENT NAME:  Martha, [name] MR#: [id number]

## 2013-03-03 NOTE — Interval H&P Note (Signed)
History and Physical Interval Note:  03/03/2013 10:07 AM  [name]  has presented today for surgery, with the diagnosis of MELENA  The various methods of treatment have been discussed with the patient and family. After consideration of risks, benefits and other options for treatment, the patient has consented to  Procedure(s) with comments: ESOPHAGOGASTRODUODENOSCOPY (EGD) (N/A) - 10;30 as a surgical intervention .  The patient's history has been reviewed, patient examined, no change in status, stable for surgery.  I have reviewed the patient's chart and labs.  Questions were answered to the patient's satisfaction.      Patient has been doing well. No orthostatic symptoms. Vital signs normal. Short stay. Had melena 2 days ago but none since. No abdominal pain and no hematemesis. White count down to 17,000 H&H reveals a hemoglobin of 8.4 which is down considerably from her fingerstick 2 days ago. NSAIDs have been held. On Protonix; EGD per plan; The risks, benefits, limitations, alternatives and imponderables have been reviewed with the patient. Potential for esophageal dilation, biopsy, etc. have also been reviewed.  Questions have been answered. All parties agreeable.for tonics.  [name]

## 2013-03-03 NOTE — Progress Notes (Signed)
cc'd to pcp 

## 2013-03-03 NOTE — H&P (View-Only) (Signed)
Primary Care Physician: MCINNIS,ANGUS G, MD  Primary Gastroenterologist:  Michael Rourk, MD   Chief Complaint  Patient presents with  . Follow-up    HPI: [name] is a 79 y.o. female here for followup her recent hospitalization. Presented to [facility] hospital back in August with sepsis-like picture, ischemic hepatitis. It was felt that she had C. difficile colitis even with negative PCR. CT showed diffuse colitis. She was treated with vancomycin and Flagyl and rapidly improved. She has been doing very well. She's back to her baseline constipation. Has a bowel movement about every other day. Manages with daily Benefiber and occasional women's laxative. Denies any blood in the stool, abdominal pain, vomiting. Her appetite has improved. She continues to have some fatigue but has come a long way. She was discharged from the [facility] center today and is heading home. Heartburn well-controlled.   No current outpatient prescriptions on file.   No current facility-administered medications for this visit.    Allergies as of 02/23/2013 - Review Complete 02/23/2013  Allergen Reaction Noted  . Sulfa antibiotics  12/31/2012  . Aspirin Other (See Comments) 04/25/2011    ROS:  General: Negative for anorexia, weight loss, fever, chills,weakness. Positive fatigue ENT: Negative for hoarseness, difficulty swallowing , nasal congestion. CV: Negative for chest pain, angina, palpitations, dyspnea on exertion, peripheral edema.  Respiratory: Negative for dyspnea at rest, dyspnea on exertion, cough, sputum, wheezing.  GI: See history of present illness. GU:  Negative for dysuria, hematuria, urinary incontinence, urinary frequency, nocturnal urination.  Endo: Negative for unusual weight change.    Physical Examination:   BP 108/58  Pulse 75  Temp(Src) 98.8 F (37.1 C) (Oral)  Ht 4' 6" (1.372 m)  Wt 97 lb (43.999 kg)  BMI 23.37 kg/m2  General: Well-nourished, well-developed in no acute  distress. Accompanied by daughter. Eyes: No icterus. Mouth: Oropharyngeal mucosa moist and pink , no lesions erythema or exudate. Lungs: Clear to auscultation bilaterally.  Heart: Regular rate and rhythm, no murmurs rubs or gallops.  Abdomen: Bowel sounds are normal, nontender, nondistended, no hepatosplenomegaly or masses, no abdominal bruits or hernia , no rebound or guarding.   Extremities: No lower extremity edema. No clubbing or deformities. Neuro: Alert and oriented x 4   Skin: Warm and dry, no jaundice.   Psych: Alert and cooperative, normal mood and affect.  Labs:  Lab Results  Component Value Date   WBC 17.2* 01/05/2013   HGB 11.2* 01/05/2013   HCT 32.9* 01/05/2013   MCV 94.3 01/05/2013   PLT 177 01/05/2013   Lab Results  Component Value Date   ALT 59* 01/05/2013   AST 18 01/05/2013   ALKPHOS 69 01/05/2013   BILITOT 0.5 01/05/2013   Lab Results  Component Value Date   CREATININE 0.71 01/02/2013   BUN 13 01/02/2013   NA 147* 01/02/2013   K 3.3* 01/02/2013   CL 119* 01/02/2013   CO2 19 01/02/2013    Imaging Studies: No results found.        

## 2013-03-07 LAB — HELICOBACTER PYLORI ABS-IGG+IGA, BLD: H Pylori IgA: 11.9 U/mL — ABNORMAL HIGH (ref <9.0)

## 2013-03-08 ENCOUNTER — Telehealth: Payer: Self-pay

## 2013-03-08 ENCOUNTER — Encounter (HOSPITAL_COMMUNITY): Payer: Self-pay | Admitting: Internal Medicine

## 2013-03-08 LAB — CBC WITH DIFFERENTIAL/PLATELET
Basophils Absolute: 0 10*3/uL (ref 0.0–0.1)
Eosinophils Absolute: 0 10*3/uL (ref 0.0–0.7)
Eosinophils Relative: 0 % (ref 0–5)
HCT: 27.5 % — ABNORMAL LOW (ref 36.0–46.0)
Lymphocytes Relative: 4 % — ABNORMAL LOW (ref 12–46)
MCH: 31.8 pg (ref 26.0–34.0)
MCHC: 33.1 g/dL (ref 30.0–36.0)
MCV: 96.2 fL (ref 78.0–100.0)
Monocytes Absolute: 0.9 10*3/uL (ref 0.1–1.0)
Neutrophils Relative %: 91 % — ABNORMAL HIGH (ref 43–77)
RBC: 2.86 MIL/uL — ABNORMAL LOW (ref 3.87–5.11)
RDW: 15.6 % — ABNORMAL HIGH (ref 11.5–15.5)

## 2013-03-08 NOTE — Telephone Encounter (Signed)
noted 

## 2013-03-08 NOTE — Telephone Encounter (Signed)
Spoke with pt and her daughter about her lab results. Pt was c/o constipation. After reviewing [facility] ov note- I asked pt if she was taking the fiber and miralax as [facility] had recommended. Pt stated no. Went over fiber recommendations and miralax recommendations per [facility] ov note with daughter and pt. pts daughter wrote down instructions and verbalized understanding.

## 2013-03-17 ENCOUNTER — Other Ambulatory Visit: Payer: Self-pay

## 2013-06-03 ENCOUNTER — Ambulatory Visit (INDEPENDENT_AMBULATORY_CARE_PROVIDER_SITE_OTHER): Payer: Medicare (Managed Care) | Admitting: Gastroenterology

## 2013-06-03 ENCOUNTER — Encounter: Payer: Self-pay | Admitting: Gastroenterology

## 2013-06-03 ENCOUNTER — Other Ambulatory Visit: Payer: Self-pay | Admitting: Internal Medicine

## 2013-06-03 VITALS — BP 138/70 | HR 94 | Temp 97.3°F | Ht <= 58 in | Wt 99.8 lb

## 2013-06-03 DIAGNOSIS — K253 Acute gastric ulcer without hemorrhage or perforation: Principal | ICD-10-CM

## 2013-06-03 DIAGNOSIS — B9681 Helicobacter pylori [H. pylori] as the cause of diseases classified elsewhere: Secondary | ICD-10-CM | POA: Insufficient documentation

## 2013-06-03 DIAGNOSIS — K259 Gastric ulcer, unspecified as acute or chronic, without hemorrhage or perforation: Secondary | ICD-10-CM

## 2013-06-03 DIAGNOSIS — A048 Other specified bacterial intestinal infections: Secondary | ICD-10-CM

## 2013-06-03 DIAGNOSIS — Z8619 Personal history of other infectious and parasitic diseases: Secondary | ICD-10-CM

## 2013-06-03 DIAGNOSIS — K279 Peptic ulcer, site unspecified, unspecified as acute or chronic, without hemorrhage or perforation: Secondary | ICD-10-CM

## 2013-06-03 DIAGNOSIS — D649 Anemia, unspecified: Secondary | ICD-10-CM

## 2013-06-03 NOTE — Progress Notes (Signed)
Primary Care Physician:  [name], MD  Primary Gastroenterologist:  Garfield Cornea, MD   Chief Complaint  Patient presents with  . Follow-up    still hurting some    HPI:  [name] is a 80 y.o. female here to schedule surveillance EGD for h/o gastric ulcer. She presented to OV back in 02/2013 with episode of melena. EGD showed significant gastric ulcer with no bleeding stigmata. She had noncritical Schatzki's ring. Had been on Aleve along with chronic prednisone at that time. H.pylori serologies were positive so she received treatment. Patient misunderstood her instructions. She did not resume her PPI after H.pylori regimen. She also stopped her iron therapy.   She feels better. No n/v. BMs are better. No melena, brbpr. No heartburn or dysphagia. No diarrhea. Denies any abdominal pain. C/o joint pain for which is is on chronic prednisone. Weight is up 6 pounds.   Current Outpatient Prescriptions  Medication Sig Dispense Refill  . acetaminophen (TYLENOL) 500 MG tablet Take 500 mg by mouth every 6 (six) hours as needed for pain.      . calcium carbonate (OS-CAL - DOSED IN MG OF ELEMENTAL CALCIUM) 1250 MG tablet Take 1 tablet by mouth daily with breakfast.      . cyclobenzaprine (FLEXERIL) 5 MG tablet Take 5 mg by mouth 3 (three) times daily as needed for muscle spasms.      . potassium chloride (K-DUR,KLOR-CON) 10 MEQ tablet Take 10 mEq by mouth 2 (two) times daily.      . predniSONE (DELTASONE) 10 MG tablet Take 10 mg by mouth daily.       No current facility-administered medications for this visit.    Allergies as of 06/03/2013 - Review Complete 06/03/2013  Allergen Reaction Noted  . Sulfa antibiotics  12/31/2012  . Aspirin Other (See Comments) 04/25/2011    Past Medical History  Diagnosis Date  . Cancer     uterine surg only  . Arthritis   . Kidney stone   . Pancreatic atrophy 12/31/2012  . Colitis 12/31/2012    presumed C.Diff. patient declined colonoscopy    Past  Surgical History  Procedure Laterality Date  . Joint replacement    . Cholecystectomy    . Right hip surgery    . Left knee surgery and revision    . Esophagogastroduodenoscopy N/A 03/03/2013    RMR: Significant gastric ulcer without bleeding stigmata requiring no endoscopic intervention today. Noncritical Schatzki's ring. Small hiatal hernia    Family History  Problem Relation Age of Onset  . Cancer Mother     History   Social History  . Marital Status: Widowed    Spouse Name: N/A    Number of Children: N/A  . Years of Education: N/A   Occupational History  . Not on file.   Social History Main Topics  . Smoking status: Former Smoker    Types: Cigarettes    Quit date: 04/25/1951  . Smokeless tobacco: Never Used  . Alcohol Use: 0.6 oz/week    1 Glasses of wine per week     Comment: 1 glass of wine a day.  . Drug Use: No  . Sexual Activity: No   Other Topics Concern  . Not on file   Social History Narrative  . No narrative on file      ROS:  General: Negative for anorexia, weight loss, fever, chills, fatigue, weakness. Eyes: Negative for vision changes.  ENT: Negative for hoarseness, difficulty swallowing , nasal congestion. CV:  Negative for chest pain, angina, palpitations, dyspnea on exertion, peripheral edema.  Respiratory: Negative for dyspnea at rest, dyspnea on exertion, cough, sputum, wheezing.  GI: See history of present illness. GU:  Negative for dysuria, hematuria, urinary incontinence, urinary frequency, nocturnal urination.  MS: chronic joint pain, low back pain.  Derm: Negative for rash or itching.  Neuro: Negative for weakness, abnormal sensation, seizure, frequent headaches, memory loss, confusion.  Psych: Negative for anxiety, depression, suicidal ideation, hallucinations.  Endo: Negative for unusual weight change.  Heme: Negative for bruising or bleeding. Allergy: Negative for rash or hives.    Physical Examination:  BP 138/70  Pulse 94   Temp(Src) 97.3 F (36.3 C) (Oral)  Ht 4\' 9"  (1.448 m)  Wt 99 lb 12.8 oz (45.269 kg)  BMI 21.59 kg/m2   General: Well-nourished, well-developed in no acute distress.  Head: Normocephalic, atraumatic.   Eyes: Conjunctiva pink, no icterus. Mouth: Oropharyngeal mucosa moist and pink , no lesions erythema or exudate. Neck: Supple without thyromegaly, masses, or lymphadenopathy.  Lungs: Clear to auscultation bilaterally.  Heart: Regular rate and rhythm, no murmurs rubs or gallops.  Abdomen: Bowel sounds are normal, nontender, nondistended, no hepatosplenomegaly or masses, no abdominal bruits or    hernia , no rebound or guarding.   Rectal: not performed Extremities: No lower extremity edema. No clubbing or deformities.  Neuro: Alert and oriented x 4 , grossly normal neurologically.  Skin: Warm and dry, no rash or jaundice.   Psych: Alert and cooperative, normal mood and affect.  Labs: Lab Results  Component Value Date   WBC 19.5* 03/07/2013   HGB 9.1* 03/07/2013   HCT 27.5* 03/07/2013   MCV 96.2 03/07/2013   PLT 338 03/07/2013     Imaging Studies: No results found.

## 2013-06-03 NOTE — Assessment & Plan Note (Addendum)
Due for surveillance EGD at this time. Unfortunately she has not been on a PPI. She now wants to wait until EGD to decide if she really needs PPI. She did complete H.pylori regimen and stopped the Aleve.  I have discussed the risks, alternatives, benefits with regards to but not limited to the risk of reaction to medication, bleeding, infection, perforation and the patient is agreeable to proceed. Written consent to be obtained.  Repeat CBC with diff at this time to follow up on anemia and leukocytosis.

## 2013-06-03 NOTE — Patient Instructions (Signed)
1. Please have your lab work done. 2. Upper endoscopy with Dr. [name] as scheduled. See separate instructions.

## 2013-06-06 NOTE — Progress Notes (Signed)
cc'd to pcp 

## 2013-06-09 ENCOUNTER — Telehealth: Payer: Self-pay | Admitting: Gastroenterology

## 2013-06-09 NOTE — Telephone Encounter (Signed)
Patient has fell and she needs to cancel her EGD and she will call back when she had recovered, possibly cracked ribs. She also has questions regarding her medications prescribed to her, please call her back and advise?

## 2013-06-09 NOTE — Telephone Encounter (Signed)
I spoke with the pt. Went over her medications with her. She proceeded to tell me that she fell and broke her ribs and wants to wait a little bit before she reschedules. Advised pt that was fine as long as she is back on the schedule by 07/04/13. Informed her that if she is not she may have to come back in for another ov. Strongly encouraged pt to let us put her back on the schedule for later in February. She denied this and stated she would call back to reschedule.

## 2013-06-09 NOTE — Telephone Encounter (Signed)
Noted  

## 2013-06-16 ENCOUNTER — Ambulatory Visit (HOSPITAL_COMMUNITY): Admission: RE | Admit: 2013-06-16 | Payer: Medicare Other | Source: Ambulatory Visit | Admitting: Internal Medicine

## 2013-06-16 ENCOUNTER — Encounter (HOSPITAL_COMMUNITY): Admission: RE | Payer: Self-pay | Source: Ambulatory Visit

## 2013-06-16 SURGERY — EGD (ESOPHAGOGASTRODUODENOSCOPY)
Anesthesia: Moderate Sedation

## 2013-07-29 ENCOUNTER — Ambulatory Visit (HOSPITAL_COMMUNITY)
Admission: RE | Admit: 2013-07-29 | Discharge: 2013-07-29 | Disposition: A | Discharge status: Home / Self Care | Payer: Medicare Other | Source: Ambulatory Visit | Attending: Internal Medicine | Admitting: Internal Medicine

## 2013-07-29 ENCOUNTER — Other Ambulatory Visit (HOSPITAL_COMMUNITY): Payer: Self-pay | Admitting: Internal Medicine

## 2013-07-29 DIAGNOSIS — R079 Chest pain, unspecified: Secondary | ICD-10-CM | POA: Insufficient documentation

## 2013-07-29 DIAGNOSIS — M810 Age-related osteoporosis without current pathological fracture: Secondary | ICD-10-CM

## 2013-07-29 DIAGNOSIS — R0781 Pleurodynia: Secondary | ICD-10-CM

## 2013-07-29 DIAGNOSIS — M259 Joint disorder, unspecified: Secondary | ICD-10-CM | POA: Insufficient documentation

## 2013-08-08 ENCOUNTER — Encounter (HOSPITAL_COMMUNITY): Payer: Self-pay | Admitting: Pharmacy Technician

## 2013-08-08 ENCOUNTER — Encounter: Payer: Self-pay | Admitting: Gastroenterology

## 2013-08-08 ENCOUNTER — Ambulatory Visit (INDEPENDENT_AMBULATORY_CARE_PROVIDER_SITE_OTHER): Payer: Medicare Other | Admitting: Gastroenterology

## 2013-08-08 VITALS — BP 139/69 | HR 80 | Temp 97.4°F | Ht <= 58 in | Wt 93.0 lb

## 2013-08-08 DIAGNOSIS — K253 Acute gastric ulcer without hemorrhage or perforation: Principal | ICD-10-CM

## 2013-08-08 DIAGNOSIS — K259 Gastric ulcer, unspecified as acute or chronic, without hemorrhage or perforation: Secondary | ICD-10-CM

## 2013-08-08 DIAGNOSIS — B9681 Helicobacter pylori [H. pylori] as the cause of diseases classified elsewhere: Secondary | ICD-10-CM

## 2013-08-08 DIAGNOSIS — A048 Other specified bacterial intestinal infections: Secondary | ICD-10-CM

## 2013-08-08 DIAGNOSIS — R1012 Left upper quadrant pain: Secondary | ICD-10-CM | POA: Insufficient documentation

## 2013-08-08 NOTE — Assessment & Plan Note (Signed)
Overdue for surveillance of EGD. History of gastric ulcer in October 2014. Canceled procedure back in January when she suffered a fall. She complains of pain in the left upper quadrant which radiates into the left posterior ribs. She states symptoms worse with meals but also worse with palpation. This is the side that she injured with the fall. At this time proceed with EGD to followup on gastric ulcer.  I have discussed the risks, alternatives, benefits with regards to but not limited to the risk of reaction to medication, bleeding, infection, perforation and the patient is agreeable to proceed. Written consent to be obtained.  She will continue PPI. I have requested labs from Dr. Juel Burrow office to followup on her anemia. Based on EGD findings, she may or may not need further evaluation. I suspect a lot of her left-sided pain is related to musculoskeletal component.

## 2013-08-08 NOTE — Progress Notes (Signed)
Primary Care Physician:  [name], MD  Primary Gastroenterologist:  Garfield Cornea, MD   Chief Complaint  Patient presents with  . EGD    HPI:  [name] is a 80 y.o. female here to schedule surveillance EGD for history of gastric ulcer. She canceled her procedure back in January after she suffered a fall and had significant rib pain. EGD back in October 2014 showed significant gastric ulcer with no bleeding stigmata. She had a noncritical Schatzki ring. Had been on Aleve along with chronic prednisone at that time. H. Pylori serologies were positive so she receive treatment. Patient misunderstood her instructions. She did not resume her PPI after H. pylori regimen and she had also stopped her iron therapy when I had last seen her back in January. Since January however she has been on omeprazole 20 mg daily. She reports she had recent blood work that Dr. Nevada Crane which was unremarkable.  Complains of LUQ pain into left ribcage/posteriorly when eats, intermittent. No n/v. No heartburn. BM normal. No melena, brbpr. No dysphagia. Feels like eating better now.   Current Outpatient Prescriptions  Medication Sig Dispense Refill  . acetaminophen (TYLENOL) 500 MG tablet Take 500 mg by mouth every 6 (six) hours as needed for pain.      Marland Kitchen omeprazole (PRILOSEC) 20 MG capsule Take 20 mg by mouth daily.      . predniSONE (DELTASONE) 10 MG tablet Take 10 mg by mouth daily with breakfast.      . saccharomyces boulardii (FLORASTOR) 250 MG capsule Take 250 mg by mouth 2 (two) times daily.       No current facility-administered medications for this visit.    Allergies as of 08/08/2013 - Review Complete 08/08/2013  Allergen Reaction Noted  . Sulfa antibiotics  12/31/2012  . Aspirin Other (See Comments) 04/25/2011    Past Medical History  Diagnosis Date  . Cancer     uterine surg only  . Arthritis   . Kidney stone   . Pancreatic atrophy 12/31/2012  . Colitis 12/31/2012    presumed C.Diff. patient  declined colonoscopy    Past Surgical History  Procedure Laterality Date  . Joint replacement    . Cholecystectomy    . Right hip surgery    . Left knee surgery and revision    . Esophagogastroduodenoscopy N/A 03/03/2013    RMR: Significant gastric ulcer without bleeding stigmata requiring no endoscopic intervention today. Noncritical Schatzki's ring. Small hiatal hernia    Family History  Problem Relation Age of Onset  . Cancer Mother     History   Social History  . Marital Status: Widowed    Spouse Name: N/A    Number of Children: N/A  . Years of Education: N/A   Occupational History  . Not on file.   Social History Main Topics  . Smoking status: Former Smoker    Types: Cigarettes    Quit date: 04/25/1951  . Smokeless tobacco: Never Used  . Alcohol Use: 0.6 oz/week    1 Glasses of wine per week     Comment: 1 glass of wine a day.  . Drug Use: No  . Sexual Activity: No   Other Topics Concern  . Not on file   Social History Narrative  . No narrative on file      ROS:  General: Negative for anorexia, weight loss, fever, chills, fatigue, weakness. Eyes: Negative for vision changes.  ENT: Negative for hoarseness, difficulty swallowing , nasal congestion. CV: Negative  for chest pain, angina, palpitations, dyspnea on exertion, peripheral edema.  Respiratory: Negative for dyspnea at rest, dyspnea on exertion, cough, sputum, wheezing.  GI: See history of present illness. GU:  Negative for dysuria, hematuria, urinary incontinence, urinary frequency, nocturnal urination.  MS: Negative for joint pain, low back pain.  Derm: Negative for rash or itching.  Neuro: Negative for weakness, abnormal sensation, seizure, frequent headaches, memory loss, confusion.  Psych: Negative for anxiety, depression, suicidal ideation, hallucinations.  Endo: Negative for unusual weight change.  Heme: Negative for bruising or bleeding. Allergy: Negative for rash or hives.    Physical  Examination:  BP 139/69  Pulse 80  Temp(Src) 97.4 F (36.3 C) (Oral)  Ht 4\' 6"  (1.372 m)  Wt 93 lb (42.185 kg)  BMI 22.41 kg/m2   General: Well-nourished, well-developed in no acute distress.  Head: Normocephalic, atraumatic.   Eyes: Conjunctiva pink, no icterus. Mouth: Oropharyngeal mucosa moist and pink , no lesions erythema or exudate. Neck: Supple without thyromegaly, masses, or lymphadenopathy.  Lungs: Clear to auscultation bilaterally.  Heart: Regular rate and rhythm, no murmurs rubs or gallops.  Abdomen: Bowel sounds are normal, nontender, nondistended, no hepatosplenomegaly or masses, no abdominal bruits or    hernia , no rebound or guarding.  She has pain with palpation of the left lateral/posterior ribs Rectal: Not performed Extremities: 1-2+ lower extremity edema at ankles. No clubbing or deformities.  Neuro: Alert and oriented x 4 , grossly normal neurologically.  Skin: Warm and dry, no rash or jaundice.   Psych: Alert and cooperative, normal mood and affect.  Labs: Lab Results  Component Value Date   WBC 19.5* 03/07/2013   HGB 9.1* 03/07/2013   HCT 27.5* 03/07/2013   MCV 96.2 03/07/2013   PLT 338 03/07/2013

## 2013-08-08 NOTE — Addendum Note (Signed)
Addended by: Idamae Schuller on: 08/08/2013 09:17 AM   Modules accepted: Orders

## 2013-08-08 NOTE — Progress Notes (Signed)
cc'd to pcp 

## 2013-08-08 NOTE — Patient Instructions (Signed)
1. Upper endoscopy with Dr. Rourk. See separate instructions.  

## 2013-08-24 ENCOUNTER — Encounter (HOSPITAL_COMMUNITY)
Admission: RE | Disposition: A | Discharge status: Home / Self Care | Payer: Self-pay | Source: Ambulatory Visit | Attending: Internal Medicine

## 2013-08-24 ENCOUNTER — Ambulatory Visit (HOSPITAL_COMMUNITY)
Admission: RE | Admit: 2013-08-24 | Discharge: 2013-08-24 | Disposition: A | Discharge status: Home / Self Care | Payer: Medicare Other | Source: Ambulatory Visit | Attending: Internal Medicine | Admitting: Internal Medicine

## 2013-08-24 ENCOUNTER — Encounter (HOSPITAL_COMMUNITY): Payer: Self-pay

## 2013-08-24 DIAGNOSIS — Z87891 Personal history of nicotine dependence: Secondary | ICD-10-CM | POA: Insufficient documentation

## 2013-08-24 DIAGNOSIS — Z8711 Personal history of peptic ulcer disease: Secondary | ICD-10-CM | POA: Insufficient documentation

## 2013-08-24 DIAGNOSIS — K253 Acute gastric ulcer without hemorrhage or perforation: Secondary | ICD-10-CM

## 2013-08-24 DIAGNOSIS — IMO0002 Reserved for concepts with insufficient information to code with codable children: Secondary | ICD-10-CM | POA: Insufficient documentation

## 2013-08-24 DIAGNOSIS — B9681 Helicobacter pylori [H. pylori] as the cause of diseases classified elsewhere: Secondary | ICD-10-CM

## 2013-08-24 DIAGNOSIS — R1012 Left upper quadrant pain: Secondary | ICD-10-CM | POA: Insufficient documentation

## 2013-08-24 HISTORY — PX: ESOPHAGOGASTRODUODENOSCOPY: SHX5428

## 2013-08-24 SURGERY — EGD (ESOPHAGOGASTRODUODENOSCOPY)
Anesthesia: Moderate Sedation

## 2013-08-24 MED ORDER — MEPERIDINE HCL 100 MG/ML IJ SOLN
INTRAMUSCULAR | Status: AC
  Filled 2013-08-24: qty 2

## 2013-08-24 MED ORDER — MIDAZOLAM HCL 5 MG/5ML IJ SOLN
INTRAMUSCULAR | Status: DC | PRN
  Administered 2013-08-24 (×3): 1 mg via INTRAVENOUS

## 2013-08-24 MED ORDER — LIDOCAINE VISCOUS 2 % MT SOLN
OROMUCOSAL | Status: AC
  Filled 2013-08-24: qty 15

## 2013-08-24 MED ORDER — MIDAZOLAM HCL 5 MG/5ML IJ SOLN
INTRAMUSCULAR | Status: AC
  Filled 2013-08-24: qty 10

## 2013-08-24 MED ORDER — SODIUM CHLORIDE 0.9 % IV SOLN
INTRAVENOUS | Status: DC
  Administered 2013-08-24: 09:00:00 via INTRAVENOUS

## 2013-08-24 MED ORDER — MEPERIDINE HCL 100 MG/ML IJ SOLN
INTRAMUSCULAR | Status: DC | PRN
  Administered 2013-08-24 (×2): 25 mg via INTRAVENOUS

## 2013-08-24 MED ORDER — ONDANSETRON HCL 4 MG/2ML IJ SOLN
INTRAMUSCULAR | Status: AC
  Filled 2013-08-24: qty 2

## 2013-08-24 MED ORDER — LIDOCAINE VISCOUS 2 % MT SOLN
OROMUCOSAL | Status: DC | PRN
  Administered 2013-08-24: 3 mL via OROMUCOSAL

## 2013-08-24 MED ORDER — ONDANSETRON HCL 4 MG/2ML IJ SOLN
INTRAMUSCULAR | Status: DC | PRN
  Administered 2013-08-24: 4 mg via INTRAVENOUS

## 2013-08-24 MED ORDER — STERILE WATER FOR IRRIGATION IR SOLN
Status: DC | PRN
  Administered 2013-08-24: 09:00:00

## 2013-08-24 NOTE — Op Note (Signed)
[facility] [address] [place], [address]   ENDOSCOPY PROCEDURE REPORT  PATIENT: Martha, [name]  MR#: [id number] BIRTHDATE: [birth year] , 89  yrs. old GENDER: Female ENDOSCOPIST: R.  Garfield Cornea, MD FACP FACG REFERRED BY:  [name], M.D. PROCEDURE DATE:  08/24/2013 PROCEDURE:     Surveillance EGD  INDICATIONS:      Follow up on previously noted gastric ulcers; status post treatment for Helicobacter pylori treatment; prior exposure to NSAIDs; ongoing low dose prednisone therapy  INFORMED CONSENT:   The risks, benefits, limitations, alternatives and imponderables have been discussed.  The potential for biopsy, esophogeal dilation, etc. have also been reviewed.  Questions have been answered.  All parties agreeable.  Please see the history and physical in the medical record for more information.  MEDICATIONS:  Versed 3 mg IV and Demerol 50 mg IV in divided doses. Xylocaine gel orally. Zofran 4 mg IV  DESCRIPTION OF PROCEDURE:   The EG-[id number] ([id number])  endoscope was introduced through the mouth and advanced to the second portion of the duodenum without difficulty or limitations.  The mucosal surfaces were surveyed very carefully during advancement of the scope and upon withdrawal.  Retroflexion view of the proximal stomach and esophagogastric junction was performed.      FINDINGS: Normal esophagus. Stomach empty. Multiple areas of scar formation the antrum with some residual friability representing sites of prior peptic ulcers. Small hiatal hernia. No infiltrating process observed. Patent pylorus. Norma,l first and second portion of the duodenum.  THERAPEUTIC / DIAGNOSTIC MANEUVERS PERFORMED:  None   COMPLICATIONS:  None  IMPRESSION:  Hiatal hernia.  Gastric mucosal scar formation at site of previously noted gastric ulcerations.  RECOMMENDATIONS:  Avoid all NSAIDs in the future. Would continue Prilosec 20 mg daily indefinitely, particularly if  corticosteroid therapy is going to be continued.    _______________________________ R. Garfield Cornea, MD FACP [facility] eSigned:  R. Garfield Cornea, MD FACP [facility] 08/24/2013 9:49 AM     CC:

## 2013-08-24 NOTE — H&P (View-Only) (Signed)
Primary Care Physician:  [name], MD  Primary Gastroenterologist:  Garfield Cornea, MD   Chief Complaint  Patient presents with  . EGD    HPI:  [name] is a 80 y.o. female here to schedule surveillance EGD for history of gastric ulcer. She canceled her procedure back in January after she suffered a fall and had significant rib pain. EGD back in October 2014 showed significant gastric ulcer with no bleeding stigmata. She had a noncritical Schatzki ring. Had been on Aleve along with chronic prednisone at that time. H. Pylori serologies were positive so she receive treatment. Patient misunderstood her instructions. She did not resume her PPI after H. pylori regimen and she had also stopped her iron therapy when I had last seen her back in January. Since January however she has been on omeprazole 20 mg daily. She reports she had recent blood work that Dr. Nevada Crane which was unremarkable.  Complains of LUQ pain into left ribcage/posteriorly when eats, intermittent. No n/v. No heartburn. BM normal. No melena, brbpr. No dysphagia. Feels like eating better now.   Current Outpatient Prescriptions  Medication Sig Dispense Refill  . acetaminophen (TYLENOL) 500 MG tablet Take 500 mg by mouth every 6 (six) hours as needed for pain.      Marland Kitchen omeprazole (PRILOSEC) 20 MG capsule Take 20 mg by mouth daily.      . predniSONE (DELTASONE) 10 MG tablet Take 10 mg by mouth daily with breakfast.      . saccharomyces boulardii (FLORASTOR) 250 MG capsule Take 250 mg by mouth 2 (two) times daily.       No current facility-administered medications for this visit.    Allergies as of 08/08/2013 - Review Complete 08/08/2013  Allergen Reaction Noted  . Sulfa antibiotics  12/31/2012  . Aspirin Other (See Comments) 04/25/2011    Past Medical History  Diagnosis Date  . Cancer     uterine surg only  . Arthritis   . Kidney stone   . Pancreatic atrophy 12/31/2012  . Colitis 12/31/2012    presumed C.Diff. patient  declined colonoscopy    Past Surgical History  Procedure Laterality Date  . Joint replacement    . Cholecystectomy    . Right hip surgery    . Left knee surgery and revision    . Esophagogastroduodenoscopy N/A 03/03/2013    RMR: Significant gastric ulcer without bleeding stigmata requiring no endoscopic intervention today. Noncritical Schatzki's ring. Small hiatal hernia    Family History  Problem Relation Age of Onset  . Cancer Mother     History   Social History  . Marital Status: Widowed    Spouse Name: N/A    Number of Children: N/A  . Years of Education: N/A   Occupational History  . Not on file.   Social History Main Topics  . Smoking status: Former Smoker    Types: Cigarettes    Quit date: 04/25/1951  . Smokeless tobacco: Never Used  . Alcohol Use: 0.6 oz/week    1 Glasses of wine per week     Comment: 1 glass of wine a day.  . Drug Use: No  . Sexual Activity: No   Other Topics Concern  . Not on file   Social History Narrative  . No narrative on file      ROS:  General: Negative for anorexia, weight loss, fever, chills, fatigue, weakness. Eyes: Negative for vision changes.  ENT: Negative for hoarseness, difficulty swallowing , nasal congestion. CV: Negative  for chest pain, angina, palpitations, dyspnea on exertion, peripheral edema.  Respiratory: Negative for dyspnea at rest, dyspnea on exertion, cough, sputum, wheezing.  GI: See history of present illness. GU:  Negative for dysuria, hematuria, urinary incontinence, urinary frequency, nocturnal urination.  MS: Negative for joint pain, low back pain.  Derm: Negative for rash or itching.  Neuro: Negative for weakness, abnormal sensation, seizure, frequent headaches, memory loss, confusion.  Psych: Negative for anxiety, depression, suicidal ideation, hallucinations.  Endo: Negative for unusual weight change.  Heme: Negative for bruising or bleeding. Allergy: Negative for rash or hives.    Physical  Examination:  BP 139/69  Pulse 80  Temp(Src) 97.4 F (36.3 C) (Oral)  Ht 4\' 6"  (1.372 m)  Wt 93 lb (42.185 kg)  BMI 22.41 kg/m2   General: Well-nourished, well-developed in no acute distress.  Head: Normocephalic, atraumatic.   Eyes: Conjunctiva pink, no icterus. Mouth: Oropharyngeal mucosa moist and pink , no lesions erythema or exudate. Neck: Supple without thyromegaly, masses, or lymphadenopathy.  Lungs: Clear to auscultation bilaterally.  Heart: Regular rate and rhythm, no murmurs rubs or gallops.  Abdomen: Bowel sounds are normal, nontender, nondistended, no hepatosplenomegaly or masses, no abdominal bruits or    hernia , no rebound or guarding.  She has pain with palpation of the left lateral/posterior ribs Rectal: Not performed Extremities: 1-2+ lower extremity edema at ankles. No clubbing or deformities.  Neuro: Alert and oriented x 4 , grossly normal neurologically.  Skin: Warm and dry, no rash or jaundice.   Psych: Alert and cooperative, normal mood and affect.  Labs: Lab Results  Component Value Date   WBC 19.5* 03/07/2013   HGB 9.1* 03/07/2013   HCT 27.5* 03/07/2013   MCV 96.2 03/07/2013   PLT 338 03/07/2013

## 2013-08-24 NOTE — Discharge Instructions (Signed)
EGD Discharge instructions Please read the instructions outlined below and refer to this sheet in the next few weeks. These discharge instructions provide you with general information on caring for yourself after you leave the hospital. Your doctor may also give you specific instructions. While your treatment has been planned according to the most current medical practices available, unavoidable complications occasionally occur. If you have any problems or questions after discharge, please call your doctor. ACTIVITY  You may resume your regular activity but move at a slower pace for the next 24 hours.   Take frequent rest periods for the next 24 hours.   Walking will help expel (get rid of) the air and reduce the bloated feeling in your abdomen.   No driving for 24 hours (because of the anesthesia (medicine) used during the test).   You may shower.   Do not sign any important legal documents or operate any machinery for 24 hours (because of the anesthesia used during the test).  NUTRITION  Drink plenty of fluids.   You may resume your normal diet.   Begin with a light meal and progress to your normal diet.   Avoid alcoholic beverages for 24 hours or as instructed by your caregiver.  MEDICATIONS  You may resume your normal medications unless your caregiver tells you otherwise.  WHAT YOU CAN EXPECT TODAY  You may experience abdominal discomfort such as a feeling of fullness or gas pains.  FOLLOW-UP  Your doctor will discuss the results of your test with you.  SEEK IMMEDIATE MEDICAL ATTENTION IF ANY OF THE FOLLOWING OCCUR:  Excessive nausea (feeling sick to your stomach) and/or vomiting.   Severe abdominal pain and distention (swelling).   Trouble swallowing.   Temperature over 101 F (37.8 C).   Rectal bleeding or vomiting of blood.     Continue Prilosec 20 mg daily, particularly if you continue on prednisone  Avoid nonsteroidal agents like Advil, Aleve and  aspirin

## 2013-08-24 NOTE — Interval H&P Note (Signed)
History and Physical Interval Note:  08/24/2013 9:19 AM  [name]  has presented today for surgery, with the diagnosis of LUQ ABD PAIN, H/O GASTRIC ULCER  The various methods of treatment have been discussed with the patient and family. After consideration of risks, benefits and other options for treatment, the patient has consented to  Procedure(s) with comments: ESOPHAGOGASTRODUODENOSCOPY (EGD) (N/A) - 9:15 AM PT REQUEST TIME as a surgical intervention .  The patient's history has been reviewed, patient examined, no change in status, stable for surgery.  I have reviewed the patient's chart and labs.  Questions were answered to the patient's satisfaction.   No change. EGD per plan.The risks, benefits, limitations, alternatives and imponderables have been reviewed with the patient. Potential for esophageal dilation, biopsy, etc. have also been reviewed.  Questions have been answered. All parties agreeable.  Cristopher Estimable Glorene Leitzke

## 2013-08-29 ENCOUNTER — Encounter (HOSPITAL_COMMUNITY): Payer: Self-pay | Admitting: Internal Medicine

## 2013-09-01 ENCOUNTER — Telehealth: Payer: Self-pay | Admitting: Gastroenterology

## 2013-09-01 ENCOUNTER — Encounter: Payer: Self-pay | Admitting: Gastroenterology

## 2013-09-01 NOTE — Telephone Encounter (Signed)
Patient called wanting results of EGD. Reviewed with her. States she is having more frequent bowel movements but NO diarrhea. Please schedule for OV in next few weeks.

## 2013-09-01 NOTE — Telephone Encounter (Signed)
Pt is aware of OV on 5/27 at 11 with AS and appt card mailed

## 2013-09-27 ENCOUNTER — Encounter (HOSPITAL_COMMUNITY)
Admission: RE | Admit: 2013-09-27 | Discharge: 2013-09-27 | Disposition: A | Discharge status: Home / Self Care | Payer: Medicare Other | Source: Ambulatory Visit | Attending: Rheumatology | Admitting: Rheumatology

## 2013-09-27 DIAGNOSIS — M810 Age-related osteoporosis without current pathological fracture: Secondary | ICD-10-CM | POA: Insufficient documentation

## 2013-09-27 MED ORDER — ZOLEDRONIC ACID 5 MG/100ML IV SOLN
5.0000 mg | Freq: Once | INTRAVENOUS | Status: AC
  Administered 2013-09-27: 5 mg via INTRAVENOUS
  Filled 2013-09-27: qty 100

## 2013-09-27 MED ORDER — SODIUM CHLORIDE 0.9 % IV SOLN
INTRAVENOUS | Status: DC
  Administered 2013-09-27: 250 mL via INTRAVENOUS

## 2013-10-05 ENCOUNTER — Encounter: Payer: Self-pay | Admitting: Gastroenterology

## 2013-10-05 ENCOUNTER — Telehealth: Payer: Self-pay | Admitting: Gastroenterology

## 2013-10-05 ENCOUNTER — Ambulatory Visit (INDEPENDENT_AMBULATORY_CARE_PROVIDER_SITE_OTHER): Payer: Medicare Other | Admitting: Gastroenterology

## 2013-10-05 VITALS — BP 109/62 | HR 91 | Temp 97.2°F | Ht <= 58 in | Wt 100.0 lb

## 2013-10-05 DIAGNOSIS — K279 Peptic ulcer, site unspecified, unspecified as acute or chronic, without hemorrhage or perforation: Secondary | ICD-10-CM

## 2013-10-05 LAB — CBC
ALBUMIN: 4
ALK PHOS: 73 U/L
ALT: 12 U/L (ref 7–35)
AST: 18 U/L
BILIRUBIN TOTAL: 0.5 mg/dL
HCT: 37 %
HGB: 12 g/dL
TOTAL PROTEIN: 6.1 g/dL

## 2013-10-05 MED ORDER — OMEPRAZOLE 20 MG PO CPDR: 20.0000 mg | DELAYED_RELEASE_CAPSULE | Freq: Every day | ORAL | Status: DC

## 2013-10-05 NOTE — Telephone Encounter (Signed)
Hgb normal.  No need to take iron.  Fatigue may be multifactorial. Would follow-up with Dr. Nevada Crane for further work-up. She is stable from a GI standpoint.

## 2013-10-05 NOTE — Patient Instructions (Signed)
I have sent the prescription for Prilosec to the pharmacy. Take this each morning on an empty stomach, 30 minutes before breakfast.   Continue to only take Tylenol products.   I think it is a good idea to keep a pack of crackers by your bed so that you may nibble on them in the morning if you feel shaky. If this continues, you need to call Dr. Nevada Crane.   We will see you back in 6 months! If you have any issues before then, please call us.   We will call if you need any further blood work or need to take anything extra. I am requesting blood work results from Dr. Nevada Crane.

## 2013-10-05 NOTE — Progress Notes (Signed)
Referring Provider: [name], MD Primary Care Physician:  [name], MD Primary GI: Dr. [name]   Chief Complaint  Patient presents with  . Follow-up    HPI:   [name] presents today with a past GI history of presumed Cdiff; presented to [facility] back in August with sepsis-like picture, ischemic hepatitis. It was felt that she had C. difficile colitis even with negative PCR. CT showed diffuse colitis. She was treated with vancomycin and Flagyl and rapidly improved. She subsequently developed melena in Oct 2014 and underwent EGD noting significant gastric ulcer but without bleeding stigmata in the setting of Aleve and chronic prednisone. H.pylori serologies were positive, and she was treated with Prevpak. Surveillance EGD recently completed April 2015 with healed ulcer.  Presents today due to more frequent bowel movements. States frequent urination. Clear. Odorless. No pain with urinating. States she had more frequent bowel movements when she called in. This has since resolved. Usually just once a day. No rectal bleeding. No abdominal pain. Pain in side with movement. Pain located under left ribs, specifically with putting on seatbelt. Positions herself differently with some relief. No N/V. Not taking Prilosec daily. States she has "the shakes" in the morning but feels better after eating. Feels tired.     Past Medical History  Diagnosis Date  . Cancer     uterine surg only  . Arthritis   . Kidney stone   . Pancreatic atrophy 12/31/2012  . Colitis 12/31/2012    presumed C.Diff. patient declined colonoscopy    Past Surgical History  Procedure Laterality Date  . Joint replacement    . Cholecystectomy    . Right hip surgery    . Left knee surgery and revision    . Esophagogastroduodenoscopy N/A 03/03/2013    RMR: Significant gastric ulcer without bleeding stigmata requiring no endoscopic intervention today. Noncritical Schatzki's ring. Small hiatal hernia  .  Esophagogastroduodenoscopy N/A 08/24/2013    Dr. [name]: Hiatal hernia.  Gastric mucosal scar formation at site of previously noted gastric ulcerations    Current Outpatient Prescriptions  Medication Sig Dispense Refill  . acetaminophen (TYLENOL) 500 MG tablet Take 500 mg by mouth every 6 (six) hours as needed for pain.      . predniSONE (DELTASONE) 10 MG tablet Take 10 mg by mouth daily with breakfast.      . saccharomyces boulardii (FLORASTOR) 250 MG capsule Take 250 mg by mouth 2 (two) times daily.      Marland Kitchen omeprazole (PRILOSEC) 20 MG capsule Take 20 mg by mouth daily.       No current facility-administered medications for this visit.    Allergies as of 10/05/2013 - Review Complete 10/05/2013  Allergen Reaction Noted  . Sulfa antibiotics  12/31/2012  . Aspirin Other (See Comments) 04/25/2011    Family History  Problem Relation Age of Onset  . Cancer Mother     History   Social History  . Marital Status: Widowed    Spouse Name: N/A    Number of Children: N/A  . Years of Education: N/A   Social History Main Topics  . Smoking status: Former Smoker    Types: Cigarettes    Quit date: 04/25/1951  . Smokeless tobacco: Never Used  . Alcohol Use: 0.6 oz/week    1 Glasses of wine per week     Comment: 1 glass of wine a day.  . Drug Use: No  . Sexual Activity: No   Other Topics Concern  .  None   Social History Narrative  . None    Review of Systems: As mentioned in HPI.   Physical Exam: BP 109/62  Pulse 91  Temp(Src) 97.2 F (36.2 C) (Oral)  Ht 4\' 10"  (1.473 m)  Wt 100 lb (45.36 kg)  BMI 20.91 kg/m2 General:   Alert and oriented. No distress noted. Pleasant and cooperative.  Head:  Normocephalic and atraumatic. Eyes:  Conjuctiva clear without scleral icterus. Heart:  S1, S2 present without murmurs, rubs, or gallops. Regular rate and rhythm. Abdomen:  +BS, soft, non-tender and non-distended. No rebound or guarding. No HSM or masses noted. Msk:  Significant  kyphosis, scoliosis, stooped over.  Extremities:  Right pedal edema greater than left, chronic Neurologic:  Alert and  oriented x4;  grossly normal neurologically. Skin:  Intact without significant lesions or rashes. Psych:  Alert and cooperative. Normal mood and affect.  July 29, 2013 outside labs: Hgb 12, Hct 36.8 LFTs normal

## 2013-10-05 NOTE — Telephone Encounter (Signed)
Pt's daughter is aware 

## 2013-10-05 NOTE — Assessment & Plan Note (Signed)
In the setting of NSAIDs, Prednisone, and +H.pylori serologies. Significantly improved. Hgb normal at 12. However, she is not taking a PPI daily despite our recommendations. Discussed restarting Prilosec daily, and I have sent this to her pharmacy.   Multiple non-GI complaints at time of visit. I have asked her to contact Dr. Nevada Crane regarding what appears to be musculoskeletal pain. No further "frequent" bowel movements per patient. Subjective reports of feeling weak and shaky in the morning could be due to low blood sugars; I have asked that she keep a pack of crackers at bedside for the mornings. Further work-up per PCP.   Return in 6 months.

## 2013-10-06 NOTE — Progress Notes (Signed)
cc'd to pcp 

## 2014-02-24 ENCOUNTER — Other Ambulatory Visit: Payer: Self-pay

## 2014-02-27 ENCOUNTER — Encounter: Payer: Self-pay | Admitting: Internal Medicine

## 2014-03-13 ENCOUNTER — Encounter: Payer: Self-pay | Admitting: Gastroenterology

## 2014-04-04 ENCOUNTER — Ambulatory Visit (HOSPITAL_COMMUNITY)
Admission: RE | Admit: 2014-04-04 | Discharge: 2014-04-04 | Disposition: A | Discharge status: Home / Self Care | Payer: Medicare Other | Source: Ambulatory Visit | Attending: Urology | Admitting: Urology

## 2014-04-04 ENCOUNTER — Other Ambulatory Visit: Payer: Self-pay | Admitting: Urology

## 2014-04-04 ENCOUNTER — Ambulatory Visit (INDEPENDENT_AMBULATORY_CARE_PROVIDER_SITE_OTHER): Payer: Medicare Other | Admitting: Urology

## 2014-04-04 DIAGNOSIS — N200 Calculus of kidney: Secondary | ICD-10-CM | POA: Insufficient documentation

## 2014-04-04 DIAGNOSIS — R3915 Urgency of urination: Secondary | ICD-10-CM

## 2014-07-27 ENCOUNTER — Emergency Department (HOSPITAL_COMMUNITY)
Admission: EM | Admit: 2014-07-27 | Discharge: 2014-07-27 | Disposition: A | Discharge status: Home / Self Care | Payer: Medicare Other | Attending: Emergency Medicine | Admitting: Emergency Medicine

## 2014-07-27 ENCOUNTER — Encounter (HOSPITAL_COMMUNITY): Payer: Self-pay | Admitting: *Deleted

## 2014-07-27 ENCOUNTER — Emergency Department (HOSPITAL_COMMUNITY): Payer: Medicare Other

## 2014-07-27 DIAGNOSIS — M1990 Unspecified osteoarthritis, unspecified site: Secondary | ICD-10-CM | POA: Diagnosis not present

## 2014-07-27 DIAGNOSIS — Z79899 Other long term (current) drug therapy: Secondary | ICD-10-CM | POA: Insufficient documentation

## 2014-07-27 DIAGNOSIS — Z8541 Personal history of malignant neoplasm of cervix uteri: Secondary | ICD-10-CM | POA: Insufficient documentation

## 2014-07-27 DIAGNOSIS — N390 Urinary tract infection, site not specified: Secondary | ICD-10-CM | POA: Insufficient documentation

## 2014-07-27 DIAGNOSIS — Z87891 Personal history of nicotine dependence: Secondary | ICD-10-CM | POA: Insufficient documentation

## 2014-07-27 DIAGNOSIS — R52 Pain, unspecified: Secondary | ICD-10-CM

## 2014-07-27 DIAGNOSIS — K5900 Constipation, unspecified: Secondary | ICD-10-CM | POA: Diagnosis present

## 2014-07-27 LAB — URINALYSIS, ROUTINE W REFLEX MICROSCOPIC
Bilirubin Urine: NEGATIVE
Glucose, UA: NEGATIVE mg/dL
Nitrite: POSITIVE — AB
Protein, ur: NEGATIVE mg/dL
Specific Gravity, Urine: 1.015 (ref 1.005–1.030)
UROBILINOGEN UA: 0.2 mg/dL (ref 0.0–1.0)
pH: 6 (ref 5.0–8.0)

## 2014-07-27 LAB — COMPREHENSIVE METABOLIC PANEL
ALBUMIN: 3.4 g/dL — AB (ref 3.5–5.2)
ALK PHOS: 63 U/L (ref 39–117)
ALT: 13 U/L (ref 0–35)
ANION GAP: 9 (ref 5–15)
AST: 23 U/L (ref 0–37)
BILIRUBIN TOTAL: 1.4 mg/dL — AB (ref 0.3–1.2)
BUN: 10 mg/dL (ref 6–23)
CALCIUM: 9 mg/dL (ref 8.4–10.5)
CO2: 28 mmol/L (ref 19–32)
Chloride: 105 mmol/L (ref 96–112)
Creatinine, Ser: 0.62 mg/dL (ref 0.50–1.10)
GFR calc Af Amer: 87 mL/min — ABNORMAL LOW (ref >90)
GFR calc non Af Amer: 75 mL/min — ABNORMAL LOW (ref >90)
Glucose, Bld: 74 mg/dL (ref 70–99)
Potassium: 2.7 mmol/L — CL (ref 3.5–5.1)
Sodium: 142 mmol/L (ref 135–145)
Total Protein: 5.9 g/dL — ABNORMAL LOW (ref 6.0–8.3)

## 2014-07-27 LAB — CBC WITH DIFFERENTIAL/PLATELET
BASOS PCT: 0 % (ref 0–1)
Basophils Absolute: 0 10*3/uL (ref 0.0–0.1)
EOS PCT: 1 % (ref 0–5)
Eosinophils Absolute: 0.1 10*3/uL (ref 0.0–0.7)
HCT: 38.9 % (ref 36.0–46.0)
Hemoglobin: 12.7 g/dL (ref 12.0–15.0)
Lymphocytes Relative: 15 % (ref 12–46)
Lymphs Abs: 1.7 10*3/uL (ref 0.7–4.0)
MCH: 32.8 pg (ref 26.0–34.0)
MCHC: 32.6 g/dL (ref 30.0–36.0)
MCV: 100.5 fL — ABNORMAL HIGH (ref 78.0–100.0)
Monocytes Absolute: 1.3 10*3/uL — ABNORMAL HIGH (ref 0.1–1.0)
Monocytes Relative: 11 % (ref 3–12)
NEUTROS ABS: 8.6 10*3/uL — AB (ref 1.7–7.7)
Neutrophils Relative %: 73 % (ref 43–77)
Platelets: 242 10*3/uL (ref 150–400)
RBC: 3.87 MIL/uL (ref 3.87–5.11)
RDW: 14.4 % (ref 11.5–15.5)
WBC: 11.8 10*3/uL — ABNORMAL HIGH (ref 4.0–10.5)

## 2014-07-27 LAB — URINE MICROSCOPIC-ADD ON

## 2014-07-27 MED ORDER — POTASSIUM CHLORIDE 20 MEQ/15ML (10%) PO SOLN
40.0000 meq | Freq: Every day | ORAL | Status: DC
  Administered 2014-07-27: 40 meq via ORAL

## 2014-07-27 MED ORDER — CEFTRIAXONE SODIUM 1 G IJ SOLR
1.0000 g | Freq: Once | INTRAMUSCULAR | Status: AC
  Administered 2014-07-27: 1 g via INTRAVENOUS
  Filled 2014-07-27: qty 10

## 2014-07-27 MED ORDER — POTASSIUM CHLORIDE 20 MEQ PO PACK: 20.0000 meq | PACK | Freq: Every day | ORAL | Status: DC

## 2014-07-27 MED ORDER — POTASSIUM CHLORIDE 10 MEQ/100ML IV SOLN
10.0000 meq | Freq: Once | INTRAVENOUS | Status: AC
  Administered 2014-07-27: 10 meq via INTRAVENOUS
  Filled 2014-07-27: qty 100

## 2014-07-27 MED ORDER — CEPHALEXIN 500 MG PO CAPS: 500.0000 mg | ORAL_CAPSULE | Freq: Four times a day (QID) | ORAL | Status: DC

## 2014-07-27 MED ORDER — POTASSIUM CHLORIDE CRYS ER 20 MEQ PO TBCR
40.0000 meq | EXTENDED_RELEASE_TABLET | Freq: Once | ORAL | Status: DC
  Filled 2014-07-27: qty 2

## 2014-07-27 MED ORDER — POTASSIUM CHLORIDE CRYS ER 20 MEQ PO TBCR: 20.0000 meq | EXTENDED_RELEASE_TABLET | Freq: Every day | ORAL | Status: DC

## 2014-07-27 MED ORDER — POTASSIUM CHLORIDE 20 MEQ/15ML (10%) PO SOLN
ORAL | Status: AC
  Filled 2014-07-27: qty 30

## 2014-07-27 MED ORDER — IOHEXOL 300 MG/ML  SOLN
50.0000 mL | Freq: Once | INTRAMUSCULAR | Status: AC | PRN
Start: 2014-07-27 — End: 2014-07-27
  Administered 2014-07-27: 50 mL via ORAL

## 2014-07-27 MED ORDER — IOHEXOL 300 MG/ML  SOLN
100.0000 mL | Freq: Once | INTRAMUSCULAR | Status: AC | PRN
  Administered 2014-07-27: 100 mL via INTRAVENOUS

## 2014-07-27 NOTE — ED Provider Notes (Signed)
CSN: [id number]     Arrival date & time 07/27/14  1425 History  This chart was scribed for Milton Ferguson, MD by Molli Posey, ED Scribe. This patient was seen in room APA07/APA07 and the patient's care was started 4:34 PM.    Chief Complaint  Patient presents with  . Constipation   Patient is a 81 y.o. female presenting with constipation. The history is provided by the patient. No language interpreter was used.  Constipation Time since last bowel movement:  4 days Progression:  Unchanged Stool description:  Watery Relieved by:  Nothing Associated symptoms: abdominal pain and vomiting   Associated symptoms: no back pain and no diarrhea    HPI Comments: [name] is a 81 y.o. female with a history of colitis who presents to the Emergency Department complaining of constipation for the last 4 days. She states her LNBM was 4 days ago. Pt reports she tried a suppository treatment last night but states her BM was mostly watery. She reports associated generalized abdominal pain at this time and states her abdomen feels "full". Pt states that she has been taking pain medication for arthritis since December. Her daughter reports similar prior episodes but reports no successful treatments. She states that pt also vomited this morning and has seemed increasingly fatigued recently.    Past Medical History  Diagnosis Date  . Cancer     uterine surg only  . Arthritis   . Kidney stone   . Pancreatic atrophy 12/31/2012  . Colitis 12/31/2012    presumed C.Diff. patient declined colonoscopy   Past Surgical History  Procedure Laterality Date  . Joint replacement    . Cholecystectomy    . Right hip surgery    . Left knee surgery and revision    . Esophagogastroduodenoscopy N/A 03/03/2013    RMR: Significant gastric ulcer without bleeding stigmata requiring no endoscopic intervention today. Noncritical Schatzki's ring. Small hiatal hernia  . Esophagogastroduodenoscopy N/A 08/24/2013    Dr.  [name]: Hiatal hernia.  Gastric mucosal scar formation at site of previously noted gastric ulcerations   Family History  Problem Relation Age of Onset  . Cancer Mother    History  Substance Use Topics  . Smoking status: Former Smoker    Types: Cigarettes    Quit date: 04/25/1951  . Smokeless tobacco: Never Used  . Alcohol Use: 0.6 oz/week    1 Glasses of wine per week     Comment: 1 glass of wine a day.   OB History    Gravida Para Term Preterm AB TAB SAB Ectopic Multiple Living   3 2 2  1  1         Review of Systems  Constitutional: Positive for appetite change. Negative for fatigue.  HENT: Negative for congestion, ear discharge and sinus pressure.   Eyes: Negative for discharge.  Respiratory: Negative for cough.   Cardiovascular: Negative for chest pain.  Gastrointestinal: Positive for vomiting, abdominal pain and constipation. Negative for diarrhea.  Genitourinary: Negative for frequency and hematuria.  Musculoskeletal: Positive for arthralgias. Negative for back pain.  Skin: Negative for rash.  Neurological: Negative for seizures and headaches.  Psychiatric/Behavioral: Negative for hallucinations.      Allergies  Sulfa antibiotics and Aspirin  Home Medications   Prior to Admission medications   Medication Sig Start Date End Date Taking? Authorizing Provider  acetaminophen (TYLENOL) 500 MG tablet Take 500 mg by mouth every 6 (six) hours as needed for pain.    Historical Provider,  MD  omeprazole (PRILOSEC) 20 MG capsule Take 1 capsule (20 mg total) by mouth daily. 30 minutes before breakfast. 10/05/13   Orvil Feil, NP  predniSONE (DELTASONE) 10 MG tablet Take 10 mg by mouth daily with breakfast.    Historical Provider, MD  saccharomyces boulardii (FLORASTOR) 250 MG capsule Take 250 mg by mouth 2 (two) times daily.    Historical Provider, MD   BP 132/65 mmHg  Pulse 90  Temp(Src) 98.9 F (37.2 C) (Oral)  Resp 18  Ht 4\' 6"  (1.372 m)  Wt 93 lb (42.185 kg)  BMI  22.41 kg/m2  SpO2 95% Physical Exam  Constitutional: She is oriented to person, place, and time. She appears well-developed.  HENT:  Head: Normocephalic.  Eyes: Conjunctivae and EOM are normal. No scleral icterus.  Neck: Neck supple. No thyromegaly present.  Cardiovascular: Normal rate and regular rhythm.  Exam reveals no gallop and no friction rub.   No murmur heard. Pulmonary/Chest: No stridor. She has no wheezes. She has no rales. She exhibits no tenderness.  Abdominal: She exhibits no distension. There is tenderness. There is no rebound.  Moderate tenderness throughout.   Musculoskeletal: Normal range of motion. She exhibits no edema.  Lymphadenopathy:    She has no cervical adenopathy.  Neurological: She is oriented to person, place, and time. She exhibits normal muscle tone. Coordination normal.  Skin: No rash noted. No erythema.  Psychiatric: She has a normal mood and affect. Her behavior is normal.  Nursing note and vitals reviewed.   ED Course  Procedures  DIAGNOSTIC STUDIES: Oxygen Saturation is 95% on RA, normal by my interpretation.    COORDINATION OF CARE: 4:37 PM Discussed treatment plan with pt at bedside and pt agreed to plan.   Labs Review Labs Reviewed  CBC WITH DIFFERENTIAL/PLATELET - Abnormal; Notable for the following:    WBC 11.8 (*)    MCV 100.5 (*)    Neutro Abs 8.6 (*)    Monocytes Absolute 1.3 (*)    All other components within normal limits  COMPREHENSIVE METABOLIC PANEL - Abnormal; Notable for the following:    Potassium 2.7 (*)    Total Protein 5.9 (*)    Albumin 3.4 (*)    Total Bilirubin 1.4 (*)    GFR calc non Af Amer 75 (*)    GFR calc Af Amer 87 (*)    All other components within normal limits  URINALYSIS, ROUTINE W REFLEX MICROSCOPIC - Abnormal; Notable for the following:    APPearance HAZY (*)    Hgb urine dipstick LARGE (*)    Ketones, ur TRACE (*)    Nitrite POSITIVE (*)    Leukocytes, UA LARGE (*)    All other components  within normal limits  URINE MICROSCOPIC-ADD ON - Abnormal; Notable for the following:    Squamous Epithelial / LPF FEW (*)    Bacteria, UA MANY (*)    All other components within normal limits  URINE CULTURE    Imaging Review Ct Abdomen Pelvis W Contrast  07/27/2014   CLINICAL DATA:  Abdominal pain and constipation for 4 days.  EXAM: CT ABDOMEN AND PELVIS WITH CONTRAST  TECHNIQUE: Multidetector CT imaging of the abdomen and pelvis was performed using the standard protocol following bolus administration of intravenous contrast.  CONTRAST:  45mL OMNIPAQUE IOHEXOL 300 MG/ML SOLN, 194mL OMNIPAQUE IOHEXOL 300 MG/ML SOLN  COMPARISON:  CT 12/31/2012  FINDINGS: There are normal appearances of the liver, spleen, pancreas and adrenals. There is a 7  x 11 mm calculus in the right renal pelvis and a 3 mm calculus in the right midpole collecting system. There is no hydronephrosis. There is no ureteral dilatation or ureteral calculus. Bowel appears unremarkable. There is no bowel obstruction. There is no perforation. There is no ascites. No acute inflammatory changes are evident in the abdomen or pelvis. Mild curvilinear scarring and slight linear atelectatic changes are present in the lung bases.  There is inferior endplate impaction at L2 with slight central loss of height. This is new from 12/31/2012. Is not acute but it could be a few weeks to several months old. No bone lesions or bony destruction evident.  IMPRESSION: 1. Right nephrolithiasis. No hydronephrosis or ureteral obstruction. 2. No acute findings are evident in the abdomen or pelvis. 3. Mild compression at L2 with inferior endplate impaction. This may be relatively recent but it is not acute.   Electronically Signed   By: Andreas Newport M.D.   On: 07/27/2014 21:28     EKG Interpretation None      MDM   Final diagnoses:  None    Uti,   tx with keflex and follow up   The chart was scribed for me under my direct supervision.  I personally  performed the history, physical, and medical decision making and all procedures in the evaluation of this patient.Milton Ferguson, MD 07/27/14 2159

## 2014-07-27 NOTE — ED Notes (Signed)
Pt states LNBM was Monday. States she is only having a little bit of watery stool. Pt is sleeping a lot. Pt was placed on oxycodone in December for arthritis. State abdominal discomfort. Swelling to BLE. Family states she does not take her lasix as she should because they make her urinate so often.

## 2014-07-27 NOTE — ED Notes (Signed)
Pt assisted onto bedpan and off, pt gone over for CT

## 2014-07-27 NOTE — ED Notes (Signed)
Pts daughter states that pt lives alone and is unsure of her last bowel movement.  Pt denies any other complaints.  States she has been taking oxycodone for her arthritis.

## 2014-07-27 NOTE — Discharge Instructions (Signed)
Follow up with your md next week. °

## 2014-07-27 NOTE — ED Notes (Signed)
K 2.7 reported to EDP.

## 2014-07-31 LAB — URINE CULTURE: SPECIAL REQUESTS: NORMAL

## 2014-08-01 ENCOUNTER — Telehealth (HOSPITAL_BASED_OUTPATIENT_CLINIC_OR_DEPARTMENT_OTHER): Payer: Self-pay | Admitting: Emergency Medicine

## 2014-08-01 NOTE — Telephone Encounter (Signed)
Post ED Visit - Positive Culture Follow-up  Culture report reviewed by antimicrobial stewardship pharmacist: []  Wes Dulaney, Pharm.D., BCPS []  Heide Guile, Pharm.D., BCPS []  Alycia Rossetti, Pharm.D., BCPS []  Regan, Pharm.D., BCPS, AAHIVP [x]  Legrand Como, Pharm.D., BCPS, AAHIVP []  Isac Sarna, Pharm.D., BCPS  Positive urine culture E. Coli Treated with cephalexin, organism sensitive to the same and no further patient follow-up is required at this time.  Hazle Nordmann 08/01/2014, 11:32 AM

## 2014-08-12 ENCOUNTER — Emergency Department (HOSPITAL_COMMUNITY): Payer: Medicare Other

## 2014-08-12 ENCOUNTER — Inpatient Hospital Stay (HOSPITAL_COMMUNITY)
Admission: EM | Admit: 2014-08-12 | Discharge: 2014-08-15 | DRG: 193 | Disposition: A | Discharge status: Skilled Nursing Facility | Payer: Medicare Other | Attending: Internal Medicine | Admitting: Internal Medicine

## 2014-08-12 ENCOUNTER — Encounter (HOSPITAL_COMMUNITY): Payer: Self-pay

## 2014-08-12 DIAGNOSIS — J9601 Acute respiratory failure with hypoxia: Secondary | ICD-10-CM | POA: Diagnosis present

## 2014-08-12 DIAGNOSIS — R0902 Hypoxemia: Secondary | ICD-10-CM

## 2014-08-12 DIAGNOSIS — Z7952 Long term (current) use of systemic steroids: Secondary | ICD-10-CM

## 2014-08-12 DIAGNOSIS — J189 Pneumonia, unspecified organism: Secondary | ICD-10-CM | POA: Diagnosis present

## 2014-08-12 DIAGNOSIS — Z87442 Personal history of urinary calculi: Secondary | ICD-10-CM

## 2014-08-12 DIAGNOSIS — Z79891 Long term (current) use of opiate analgesic: Secondary | ICD-10-CM

## 2014-08-12 DIAGNOSIS — I959 Hypotension, unspecified: Secondary | ICD-10-CM

## 2014-08-12 DIAGNOSIS — D539 Nutritional anemia, unspecified: Secondary | ICD-10-CM | POA: Diagnosis present

## 2014-08-12 DIAGNOSIS — Z809 Family history of malignant neoplasm, unspecified: Secondary | ICD-10-CM

## 2014-08-12 DIAGNOSIS — I5032 Chronic diastolic (congestive) heart failure: Secondary | ICD-10-CM | POA: Diagnosis present

## 2014-08-12 DIAGNOSIS — R0602 Shortness of breath: Secondary | ICD-10-CM | POA: Diagnosis not present

## 2014-08-12 DIAGNOSIS — Z8711 Personal history of peptic ulcer disease: Secondary | ICD-10-CM

## 2014-08-12 DIAGNOSIS — E876 Hypokalemia: Secondary | ICD-10-CM | POA: Diagnosis present

## 2014-08-12 DIAGNOSIS — K5900 Constipation, unspecified: Secondary | ICD-10-CM | POA: Diagnosis present

## 2014-08-12 DIAGNOSIS — M1990 Unspecified osteoarthritis, unspecified site: Secondary | ICD-10-CM | POA: Diagnosis present

## 2014-08-12 DIAGNOSIS — Z87891 Personal history of nicotine dependence: Secondary | ICD-10-CM

## 2014-08-12 DIAGNOSIS — I9589 Other hypotension: Secondary | ICD-10-CM | POA: Diagnosis not present

## 2014-08-12 LAB — CBC WITH DIFFERENTIAL/PLATELET
BASOS ABS: 0.1 10*3/uL (ref 0.0–0.1)
Basophils Relative: 1 % (ref 0–1)
EOS ABS: 0.1 10*3/uL (ref 0.0–0.7)
Eosinophils Relative: 1 % (ref 0–5)
HCT: 39 % (ref 36.0–46.0)
Hemoglobin: 12.3 g/dL (ref 12.0–15.0)
LYMPHS ABS: 1 10*3/uL (ref 0.7–4.0)
Lymphocytes Relative: 11 % — ABNORMAL LOW (ref 12–46)
MCH: 32.5 pg (ref 26.0–34.0)
MCHC: 31.5 g/dL (ref 30.0–36.0)
MCV: 102.9 fL — ABNORMAL HIGH (ref 78.0–100.0)
Monocytes Absolute: 1.1 10*3/uL — ABNORMAL HIGH (ref 0.1–1.0)
Monocytes Relative: 11 % (ref 3–12)
NEUTROS ABS: 7.6 10*3/uL (ref 1.7–7.7)
Neutrophils Relative %: 76 % (ref 43–77)
PLATELETS: 218 10*3/uL (ref 150–400)
RBC: 3.79 MIL/uL — ABNORMAL LOW (ref 3.87–5.11)
RDW: 14.4 % (ref 11.5–15.5)
WBC: 9.8 10*3/uL (ref 4.0–10.5)

## 2014-08-12 LAB — URINALYSIS, ROUTINE W REFLEX MICROSCOPIC
BILIRUBIN URINE: NEGATIVE
GLUCOSE, UA: NEGATIVE mg/dL
Ketones, ur: 15 mg/dL — AB
NITRITE: NEGATIVE
PH: 6.5 (ref 5.0–8.0)
Protein, ur: NEGATIVE mg/dL
Specific Gravity, Urine: 1.015 (ref 1.005–1.030)
UROBILINOGEN UA: 0.2 mg/dL (ref 0.0–1.0)

## 2014-08-12 LAB — COMPREHENSIVE METABOLIC PANEL
ALT: 11 U/L (ref 0–35)
AST: 21 U/L (ref 0–37)
Albumin: 3.4 g/dL — ABNORMAL LOW (ref 3.5–5.2)
Alkaline Phosphatase: 55 U/L (ref 39–117)
Anion gap: 10 (ref 5–15)
BUN: 10 mg/dL (ref 6–23)
CALCIUM: 8.7 mg/dL (ref 8.4–10.5)
CHLORIDE: 107 mmol/L (ref 96–112)
CO2: 25 mmol/L (ref 19–32)
CREATININE: 0.56 mg/dL (ref 0.50–1.10)
GFR calc Af Amer: >90 mL/min (ref >90)
GFR calc non Af Amer: 78 mL/min — ABNORMAL LOW (ref >90)
Glucose, Bld: 71 mg/dL (ref 70–99)
Potassium: 3.6 mmol/L (ref 3.5–5.1)
Sodium: 142 mmol/L (ref 135–145)
Total Bilirubin: 1 mg/dL (ref 0.3–1.2)
Total Protein: 5.9 g/dL — ABNORMAL LOW (ref 6.0–8.3)

## 2014-08-12 LAB — URINE MICROSCOPIC-ADD ON

## 2014-08-12 LAB — I-STAT CG4 LACTIC ACID, ED
LACTIC ACID, VENOUS: 1.05 mmol/L (ref 0.5–2.0)
LACTIC ACID, VENOUS: 0.93 mmol/L (ref 0.5–2.0)

## 2014-08-12 MED ORDER — ENOXAPARIN SODIUM 40 MG/0.4ML ~~LOC~~ SOLN: 40.0000 mg | SUBCUTANEOUS | Status: DC

## 2014-08-12 MED ORDER — OXYCODONE-ACETAMINOPHEN 5-325 MG PO TABS
0.5000 | ORAL_TABLET | ORAL | Status: DC | PRN
  Administered 2014-08-14 – 2014-08-15 (×2): 0.5 via ORAL
  Filled 2014-08-12 (×2): qty 1

## 2014-08-12 MED ORDER — SODIUM CHLORIDE 0.9 % IV BOLUS (SEPSIS)
500.0000 mL | INTRAVENOUS | Status: AC
  Administered 2014-08-12: 500 mL via INTRAVENOUS

## 2014-08-12 MED ORDER — PREDNISONE 10 MG PO TABS
10.0000 mg | ORAL_TABLET | Freq: Every day | ORAL | Status: DC
  Administered 2014-08-13 – 2014-08-15 (×3): 10 mg via ORAL
  Filled 2014-08-12 (×4): qty 1

## 2014-08-12 MED ORDER — CEFTRIAXONE SODIUM IN DEXTROSE 20 MG/ML IV SOLN
1.0000 g | INTRAVENOUS | Status: DC
  Filled 2014-08-12: qty 50

## 2014-08-12 MED ORDER — POTASSIUM CHLORIDE 20 MEQ PO PACK
20.0000 meq | PACK | Freq: Every day | ORAL | Status: DC
  Administered 2014-08-13 – 2014-08-15 (×3): 20 meq via ORAL
  Filled 2014-08-12 (×3): qty 1

## 2014-08-12 MED ORDER — SODIUM CHLORIDE 0.9 % IV SOLN
INTRAVENOUS | Status: DC
  Administered 2014-08-12: via INTRAVENOUS

## 2014-08-12 MED ORDER — SODIUM CHLORIDE 0.9 % IV BOLUS (SEPSIS)
1000.0000 mL | Freq: Once | INTRAVENOUS | Status: AC
  Administered 2014-08-12: 1000 mL via INTRAVENOUS

## 2014-08-12 MED ORDER — DEXTROSE 5 % IV SOLN
1.0000 g | Freq: Once | INTRAVENOUS | Status: AC
  Administered 2014-08-12: 1 g via INTRAVENOUS
  Filled 2014-08-12: qty 10

## 2014-08-12 MED ORDER — CETYLPYRIDINIUM CHLORIDE 0.05 % MT LIQD
7.0000 mL | Freq: Two times a day (BID) | OROMUCOSAL | Status: DC
  Administered 2014-08-12 – 2014-08-15 (×6): 7 mL via OROMUCOSAL

## 2014-08-12 MED ORDER — ENOXAPARIN SODIUM 30 MG/0.3ML ~~LOC~~ SOLN
30.0000 mg | SUBCUTANEOUS | Status: DC
  Administered 2014-08-13 – 2014-08-15 (×3): 30 mg via SUBCUTANEOUS
  Filled 2014-08-12 (×3): qty 0.3

## 2014-08-12 MED ORDER — AZITHROMYCIN 250 MG PO TABS
500.0000 mg | ORAL_TABLET | ORAL | Status: DC
  Administered 2014-08-13 – 2014-08-14 (×2): 500 mg via ORAL
  Filled 2014-08-12 (×2): qty 2

## 2014-08-12 MED ORDER — AZITHROMYCIN 500 MG IV SOLR
500.0000 mg | Freq: Once | INTRAVENOUS | Status: AC
  Administered 2014-08-12: 500 mg via INTRAVENOUS
  Filled 2014-08-12: qty 500

## 2014-08-12 NOTE — H&P (Signed)
History and Physical  [name] SWN:[id number] DOB: 1918/12/09 DOA: 08/12/2014  Referring physician: Dr Eulis Foster, ED physician PCP: [name], MD   Chief Complaint: Shortness of breath, choking spell  HPI: [name] is a 81 y.o. female  With a history of arthritis requiring chronic steroid use, grade 1 diastolic heart failure on echocardiogram in August 2014. Patient seen for 10 days of worsening dyspnea particularly with exertion that has been worse over the past 2 days. Patient has a productive cough with white phlegm, has been worse over the past 2 days. Dyspnea is improved with rest. The patient had a choking spell earlier today when she was unable to expectorate the phlegm. Her family brought her to the emergency department for evaluation after this episode.   Review of Systems:   Pt complains of decreased appetite and decreased oral intake.  Pt denies any fevers, chills, vomiting, abdominal pain, chest pain, pleurisy, wheezing, palpitations, dysuria, confusion, lightheadedness, dizziness.  Review of systems are otherwise negative  Past Medical History  Diagnosis Date  . Cancer     uterine surg only  . Arthritis   . Kidney stone   . Pancreatic atrophy 12/31/2012  . Colitis 12/31/2012    presumed C.Diff. patient declined colonoscopy   Past Surgical History  Procedure Laterality Date  . Joint replacement    . Cholecystectomy    . Right hip surgery    . Left knee surgery and revision    . Esophagogastroduodenoscopy N/A 03/03/2013    RMR: Significant gastric ulcer without bleeding stigmata requiring no endoscopic intervention today. Noncritical Schatzki's ring. Small hiatal hernia  . Esophagogastroduodenoscopy N/A 08/24/2013    Dr. [name]: Hiatal hernia.  Gastric mucosal scar formation at site of previously noted gastric ulcerations   Social History:  reports that she quit smoking about 63 years ago. Her smoking use included Cigarettes. She has never used smokeless  tobacco. She reports that she drinks about 0.6 oz of alcohol per week. She reports that she does not use illicit drugs. Patient lives at home & is able to participate in activities of daily living  Allergies  Allergen Reactions  . Sulfa Antibiotics   . Aspirin Other (See Comments)    Makes stomach burn    Family History  Problem Relation Age of Onset  . Cancer Mother       Prior to Admission medications   Medication Sig Start Date End Date Taking? Authorizing Provider  acetaminophen (TYLENOL) 500 MG tablet Take 500 mg by mouth every 6 (six) hours as needed for pain.   Yes Historical Provider, MD  Multiple Vitamin (MULTIVITAMIN WITH MINERALS) TABS tablet Take 1 tablet by mouth daily.   Yes Historical Provider, MD  oxyCODONE-acetaminophen (PERCOCET/ROXICET) 5-325 MG per tablet Take 0.5-1 tablets by mouth every 4 (four) hours as needed for moderate pain or severe pain (pain).    Yes Historical Provider, MD  potassium chloride (KLOR-CON) 20 MEQ packet Take 20 mEq by mouth daily. Mixed with water/orange juice   Yes Historical Provider, MD  predniSONE (DELTASONE) 10 MG tablet Take 10 mg by mouth daily with breakfast.   Yes Historical Provider, MD  saccharomyces boulardii (FLORASTOR) 250 MG capsule Take 250 mg by mouth 2 (two) times daily.   Yes Historical Provider, MD  torsemide (DEMADEX) 10 MG tablet Take 5 mg by mouth daily.   Yes Historical Provider, MD  cephALEXin (KEFLEX) 500 MG capsule Take 1 capsule (500 mg total) by mouth 4 (four) times daily.  Patient not taking: Reported on 08/12/2014 07/27/14   Milton Ferguson, MD    Physical Exam: BP 99/41 mmHg  Pulse 83  Temp(Src) 99.5 F (37.5 C) (Oral)  Resp 18  Ht 4\' 6"  (1.372 m)  Wt 42.185 kg (93 lb)  BMI 22.41 kg/m2  SpO2 93%  General: Elderly Caucasian female. Awake and alert and oriented x3. No acute cardiopulmonary distress.  Eyes: Pupils equal, round, reactive to light. Extraocular muscles are intact. Sclerae anicteric and  noninjected.  ENT:  Moist mucosal membranes. No mucosal lesions.  Neck: Neck supple without lymphadenopathy. No carotid bruits. No masses palpated.  Cardiovascular: Regular rate with normal S1-S2 sounds. No murmurs, rubs, gallops auscultated. No JVD.  Respiratory: Good respiratory effort.  Mild bibasilar Rales.  No wheezing or rhonchi  Abdomen: Soft, nontender, nondistended. Active bowel sounds. No masses or hepatosplenomegaly  Skin: Dry, warm to touch. 2+ dorsalis pedis and radial pulses. Musculoskeletal: No calf or leg pain. All major joints not erythematous nontender.  Psychiatric: Intact judgment and insight.  Neurologic: No focal neurological deficits. Cranial nerves II through XII are grossly intact.           Labs on Admission:  Basic Metabolic Panel:  Recent Labs Lab 08/12/14 1718  NA 142  K 3.6  CL 107  CO2 25  GLUCOSE 71  BUN 10  CREATININE 0.56  CALCIUM 8.7   Liver Function Tests:  Recent Labs Lab 08/12/14 1718  AST 21  ALT 11  ALKPHOS 55  BILITOT 1.0  PROT 5.9*  ALBUMIN 3.4*   No results for input(s): LIPASE, AMYLASE in the last 168 hours. No results for input(s): AMMONIA in the last 168 hours. CBC:  Recent Labs Lab 08/12/14 1718  WBC 9.8  NEUTROABS 7.6  HGB 12.3  HCT 39.0  MCV 102.9*  PLT 218   Cardiac Enzymes: No results for input(s): CKTOTAL, CKMB, CKMBINDEX, TROPONINI in the last 168 hours.  BNP (last 3 results) No results for input(s): BNP in the last 8760 hours.  ProBNP (last 3 results) No results for input(s): PROBNP in the last 8760 hours.  CBG: No results for input(s): GLUCAP in the last 168 hours.  Radiological Exams on Admission: Dg Chest Portable 1 View  08/12/2014   CLINICAL DATA:  Shortness of breath, fever  EXAM: PORTABLE CHEST - 1 VIEW  COMPARISON:  07/29/2013  FINDINGS: Severe rightward scoliosis of the thoracic spine is reidentified. The patient is rotated to the left. Severe left glenohumeral joint degenerative change.  Marked enlargement of the cardiomediastinal silhouette reidentified. Patchy bibasilar airspace opacities are identified. Trace if any pleural fluid.  IMPRESSION: Mild patchy bibasilar airspace opacities which could indicate pneumonia or other alveolar filling process.   Electronically Signed   By: Conchita Paris M.D.   On: 08/12/2014 16:20    EKG: Independently reviewed. Sinus rhythm with normal intervals. Probable old inferior septal infarct.  Assessment/Plan Present on Admission:  . CAP (community acquired pneumonia) . Hypotension  Admit the patient to telemetry for observation Continue ceftriaxone and azithromycin for community-acquired pneumonia Will hold torsemide The patient received 1500 mL's of IV fluids in the emergency department - will hold off on continuing hydration of the patient due to history of diastolic heart failure and the risk of fluid overloading the patient. Continue home medications  DVT prophylaxis: Lovenox  Consultants: None  Code Status: Full code  Family Communication: None   Disposition Plan: Home following observation  Time spent: 50 minutes was spent with face-to-face time  with patient with at least 50% with counseling and coordination of care  Truett Mainland, DO [facility] Pager [phone]

## 2014-08-12 NOTE — Progress Notes (Signed)
81 yo F admitted with CAP and started on Rocephin & Zithromax.   Neither antibiotic requires renal dose adjustment.   Continue medications as ordered by MD. Pharmacy to sign off.  Re-consult as needed.    Netta Cedars, PharmD, BCPS 08/13/2014@12 :00 AM

## 2014-08-12 NOTE — ED Notes (Signed)
Pt has bilateral pitting edema to feet and lower legs.  Redness and purple discoloration noted to both legs but r worse than left.  Pt denies pain but says legs are "sensitive."   Bilateral pedal pulses present.

## 2014-08-12 NOTE — ED Notes (Signed)
Per family has decreased appetite, feeling tired, cough with white sputum

## 2014-08-12 NOTE — ED Notes (Signed)
MD at the bedside  

## 2014-08-12 NOTE — ED Provider Notes (Signed)
CSN: [id number]     Arrival date & time 08/12/14  1453 History   First MD Initiated Contact with Patient 08/12/14 1601     Chief Complaint  Patient presents with  . Shortness of Breath     (Consider location/radiation/quality/duration/timing/severity/associated sxs/prior Treatment) HPI   [name] is a 81 y.o. female who is here for evaluation of an episode of choking with mucus, following coughing. She has had cough productive of a white mucus, for 3 weeks. She saw her doctor several days ago after an evaluation for constipation. Hypokalemia here. He stated that she was doing better. He decided to decrease her prednisone because she is having peripheral edema related to it. She denies fever, chills, chest pain, weakness or dizziness. She's been able to eat well. She has not had vomiting or diarrhea. She denies a history of seasonal allergies. There are no other no modifying factors.  Past Medical History  Diagnosis Date  . Cancer     uterine surg only  . Arthritis   . Kidney stone   . Pancreatic atrophy 12/31/2012  . Colitis 12/31/2012    presumed C.Diff. patient declined colonoscopy   Past Surgical History  Procedure Laterality Date  . Joint replacement    . Cholecystectomy    . Right hip surgery    . Left knee surgery and revision    . Esophagogastroduodenoscopy N/A 03/03/2013    RMR: Significant gastric ulcer without bleeding stigmata requiring no endoscopic intervention today. Noncritical Schatzki's ring. Small hiatal hernia  . Esophagogastroduodenoscopy N/A 08/24/2013    Dr. [name]: Hiatal hernia.  Gastric mucosal scar formation at site of previously noted gastric ulcerations   Family History  Problem Relation Age of Onset  . Cancer Mother    History  Substance Use Topics  . Smoking status: Former Smoker    Types: Cigarettes    Quit date: 04/25/1951  . Smokeless tobacco: Never Used  . Alcohol Use: 0.6 oz/week    1 Glasses of wine per week     Comment: 1 glass  of wine a day- former   OB History    Gravida Para Term Preterm AB TAB SAB Ectopic Multiple Living   3 2 2  1  1         Review of Systems  All other systems reviewed and are negative.     Allergies  Sulfa antibiotics and Aspirin  Home Medications   Prior to Admission medications   Medication Sig Start Date End Date Taking? Authorizing Provider  acetaminophen (TYLENOL) 500 MG tablet Take 500 mg by mouth every 6 (six) hours as needed for pain.   Yes Historical Provider, MD  Multiple Vitamin (MULTIVITAMIN WITH MINERALS) TABS tablet Take 1 tablet by mouth daily.   Yes Historical Provider, MD  oxyCODONE-acetaminophen (PERCOCET/ROXICET) 5-325 MG per tablet Take 0.5-1 tablets by mouth every 4 (four) hours as needed for moderate pain or severe pain (pain).    Yes Historical Provider, MD  potassium chloride (KLOR-CON) 20 MEQ packet Take 20 mEq by mouth daily. Mixed with water/orange juice   Yes Historical Provider, MD  predniSONE (DELTASONE) 10 MG tablet Take 10 mg by mouth daily with breakfast.   Yes Historical Provider, MD  saccharomyces boulardii (FLORASTOR) 250 MG capsule Take 250 mg by mouth 2 (two) times daily.   Yes Historical Provider, MD  torsemide (DEMADEX) 10 MG tablet Take 5 mg by mouth daily.   Yes Historical Provider, MD  cephALEXin (KEFLEX) 500 MG capsule Take 1  capsule (500 mg total) by mouth 4 (four) times daily. Patient not taking: Reported on 08/12/2014 07/27/14   Milton Ferguson, MD   BP 99/41 mmHg  Pulse 83  Temp(Src) 99.5 F (37.5 C) (Oral)  Resp 18  Ht 4\' 6"  (1.372 m)  Wt 93 lb (42.185 kg)  BMI 22.41 kg/m2  SpO2 93% Physical Exam  Constitutional: She is oriented to person, place, and time. She appears well-developed and well-nourished.  HENT:  Head: Normocephalic and atraumatic.  Right Ear: External ear normal.  Left Ear: External ear normal.  Eyes: Conjunctivae and EOM are normal. Pupils are equal, round, and reactive to light.  Neck: Normal range of motion and  phonation normal. Neck supple.  Cardiovascular: Normal rate, regular rhythm and normal heart sounds.   Pulmonary/Chest: Effort normal. She exhibits no bony tenderness.  Good airflow bilaterally, scattered wheezes. No rhonchi or rales.  Abdominal: Soft. There is no tenderness.  Musculoskeletal: Normal range of motion. She exhibits edema (2+ edema bilaterally).  Severe thoracic kyphosis and scoliosis.  Neurological: She is alert and oriented to person, place, and time. No cranial nerve deficit or sensory deficit. She exhibits normal muscle tone. Coordination normal.  Skin: Skin is warm, dry and intact.  Psychiatric: She has a normal mood and affect. Her behavior is normal. Judgment and thought content normal.  Nursing note and vitals reviewed.   ED Course  Procedures (including critical care time)  Nasal cannula oxygen removed at 16:10  17:05- . At this time. Blood pressure 86/53. Oxygen saturation on room air varies between 89 and 94%, depending on her depth of inspiration. She remains lucent. qSofa= 1>> Not High Risk  17:07 Sepsis order set initiated.  Will replace O2 for comfort    Medications  sodium chloride 0.9 % bolus 1,000 mL (0 mLs Intravenous Stopped 08/12/14 1906)    Followed by  sodium chloride 0.9 % bolus 500 mL (500 mLs Intravenous New Bag/Given 08/12/14 1906)  azithromycin (ZITHROMAX) 500 mg in dextrose 5 % 250 mL IVPB (500 mg Intravenous New Bag/Given 08/12/14 1900)  cefTRIAXone (ROCEPHIN) 1 g in dextrose 5 % 50 mL IVPB (0 g Intravenous Stopped 08/12/14 1900)    Patient Vitals for the past 24 hrs:  BP Temp Temp src Pulse Resp SpO2 Height Weight  08/12/14 1906 (!) 99/41 mmHg 99.5 F (37.5 C) Oral 83 18 93 % - -  08/12/14 1830 113/61 mmHg - - 93 19 100 % - -  08/12/14 1805 100/62 mmHg 99.2 F (37.3 C) Oral 76 18 94 % - -  08/12/14 1800 100/62 mmHg - - 78 17 95 % - -  08/12/14 1644 (!) 105/54 mmHg - - 68 17 95 % - -  08/12/14 1459 110/92 mmHg 99.1 F (37.3 C) Oral 81 20  98 % 4\' 6"  (1.372 m) 93 lb (42.185 kg)    7:45 PM Reevaluation with update and discussion. After initial assessment and treatment, an updated evaluation reveals she remains calm and alert. Findings discussed with patient and daughter. All questions answered.Daleen Bo L    19:45- qSofa is still low risk  Labs Review Labs Reviewed  CBC WITH DIFFERENTIAL/PLATELET - Abnormal; Notable for the following:    RBC 3.79 (*)    MCV 102.9 (*)    Lymphocytes Relative 11 (*)    Monocytes Absolute 1.1 (*)    All other components within normal limits  COMPREHENSIVE METABOLIC PANEL - Abnormal; Notable for the following:    Total Protein 5.9 (*)  Albumin 3.4 (*)    GFR calc non Af Amer 78 (*)    All other components within normal limits  URINALYSIS, ROUTINE W REFLEX MICROSCOPIC - Abnormal; Notable for the following:    Hgb urine dipstick LARGE (*)    Ketones, ur 15 (*)    Leukocytes, UA TRACE (*)    All other components within normal limits  URINE MICROSCOPIC-ADD ON - Abnormal; Notable for the following:    Squamous Epithelial / LPF MANY (*)    All other components within normal limits  CULTURE, BLOOD (ROUTINE X 2)  CULTURE, BLOOD (ROUTINE X 2)  URINE CULTURE  I-STAT CG4 LACTIC ACID, ED    CRITICAL CARE Performed by: Daleen Bo L Total critical care time: 40 minutes Critical care time was exclusive of separately billable procedures and treating other patients. Critical care was necessary to treat or prevent imminent or life-threatening deterioration. Critical care was time spent personally by me on the following activities: development of treatment plan with patient and/or surrogate as well as nursing, discussions with consultants, evaluation of patient's response to treatment, examination of patient, obtaining history from patient or surrogate, ordering and performing treatments and interventions, ordering and review of laboratory studies, ordering and review of radiographic  studies, pulse oximetry and re-evaluation of patient's condition.  Imaging Review Dg Chest Portable 1 View  08/12/2014   CLINICAL DATA:  Shortness of breath, fever  EXAM: PORTABLE CHEST - 1 VIEW  COMPARISON:  07/29/2013  FINDINGS: Severe rightward scoliosis of the thoracic spine is reidentified. The patient is rotated to the left. Severe left glenohumeral joint degenerative change. Marked enlargement of the cardiomediastinal silhouette reidentified. Patchy bibasilar airspace opacities are identified. Trace if any pleural fluid.  IMPRESSION: Mild patchy bibasilar airspace opacities which could indicate pneumonia or other alveolar filling process.   Electronically Signed   By: Conchita Paris M.D.   On: 08/12/2014 16:20     EKG Interpretation   Date/Time:  Saturday August 12 2014 15:16:14 EDT Ventricular Rate:  75 PR Interval:  160 QRS Duration: 78 QT Interval:  358 QTC Calculation: 400 R Axis:   -25 Text Interpretation:  Sinus rhythm Borderline left axis deviation Probable  anteroseptal infarct, old Baseline wander in lead(s) V1 since last tracing  no significant change Confirmed by [facility]  MD, Sulay Brymer [phone]) on 08/12/2014  4:01:51 PM      MDM   Final diagnoses:  CAP (community acquired pneumonia)  Hypotension, unspecified hypotension type  Hypoxia    Community-acquired pneumonia with secondary hypoxia and hypotension. I doubt that she has severe sepsis. At the time of disposition she is at low risk for worsening condition. She's been treated maximally in the ED, with 30 cc/kg fluid bolus, and empiric antibiotics, parenterally.  Nursing Notes Reviewed/ Care Coordinated Applicable Imaging Reviewed Interpretation of Laboratory Data incorporated into ED treatment  Plan: Admit   Daleen Bo, MD 08/12/14 1947

## 2014-08-12 NOTE — ED Notes (Signed)
EMS reports pt c/o cough, congestion, and SOB.  Reports has had issue with productive cough off and on for the past year.  EMS reports pt started coughing at home, was unable to get anything up, and pt became anxious.  EMS reports pt calmer at this time and airway cleared.  Pt says at times, the mucus gets stuck in throat.  EMS put pt on NRB momentarily and pt felt better.

## 2014-08-13 DIAGNOSIS — I5032 Chronic diastolic (congestive) heart failure: Secondary | ICD-10-CM

## 2014-08-13 DIAGNOSIS — I9589 Other hypotension: Secondary | ICD-10-CM | POA: Diagnosis not present

## 2014-08-13 DIAGNOSIS — J189 Pneumonia, unspecified organism: Secondary | ICD-10-CM | POA: Diagnosis not present

## 2014-08-13 DIAGNOSIS — D539 Nutritional anemia, unspecified: Secondary | ICD-10-CM

## 2014-08-13 DIAGNOSIS — J9601 Acute respiratory failure with hypoxia: Secondary | ICD-10-CM | POA: Diagnosis not present

## 2014-08-13 LAB — CBC
HCT: 31.3 % — ABNORMAL LOW (ref 36.0–46.0)
Hemoglobin: 10.1 g/dL — ABNORMAL LOW (ref 12.0–15.0)
MCH: 33.4 pg (ref 26.0–34.0)
MCHC: 32.3 g/dL (ref 30.0–36.0)
MCV: 103.6 fL — AB (ref 78.0–100.0)
PLATELETS: 183 10*3/uL (ref 150–400)
RBC: 3.02 MIL/uL — AB (ref 3.87–5.11)
RDW: 14.5 % (ref 11.5–15.5)
WBC: 8.8 10*3/uL (ref 4.0–10.5)

## 2014-08-13 LAB — STREP PNEUMONIAE URINARY ANTIGEN: STREP PNEUMO URINARY ANTIGEN: NEGATIVE

## 2014-08-13 MED ORDER — POLYETHYLENE GLYCOL 3350 17 G PO PACK
17.0000 g | PACK | Freq: Two times a day (BID) | ORAL | Status: DC
  Administered 2014-08-13 – 2014-08-14 (×3): 17 g via ORAL
  Filled 2014-08-13 (×4): qty 1

## 2014-08-13 MED ORDER — DEXTROSE 5 % IV SOLN
1.0000 g | INTRAVENOUS | Status: DC
  Administered 2014-08-13 – 2014-08-14 (×2): 1 g via INTRAVENOUS
  Filled 2014-08-13 (×3): qty 10

## 2014-08-13 NOTE — Progress Notes (Signed)
Patient refused to ambulate in hallway tonight. Will re attempt at another time.

## 2014-08-13 NOTE — Progress Notes (Signed)
[name], daughter called expressing family concern of patient disposition at discharge. Both daughters [place] and are concerned for mother going home alone.  Pt has caregiver, but only part time.  Will be contacting primary care to discuss if placement an option.  Both daughters names and numbers are on chart.

## 2014-08-13 NOTE — Progress Notes (Signed)
  PROGRESS NOTE  [name] QJJ:[id number] DOB: 02-13-1919 DOA: 08/12/2014 PCP: [name], MD  Summary: 81 year old woman presented with increasing shortness of breath on exertion, productive cough. She was found to be hypotensive and hypoxic in the emergency department, qSOFA was low risk and was admitted for CAP.  Assessment/Plan: 1. Acute hypoxic respiratory failure, stable. 2. CAP, afebrile, stable vitals, subjectively improved. 3. Hypotension on chronic steroids. Resolved. Follow clinically. Likely resume torsemide in AM. Lactic acid was normal, no evidence of sepsis at this time. 4. Macrocytic anemia. Consider anemia of acute illness. 5. Chronic diastolic congestive heart failure. Appears compensated. 6. OA on chronic prednisone 7. Chronic constipation   Overall improved. Plan continue empiric abx.  Check CBC in AM  Bowel regimen  Code Status: full code DVT prophylaxis: Lovenox Family Communication: none present Disposition Plan: home  Murray Hodgkins, MD  [facility]  Pager [phone] If 7PM-7AM, please contact night-coverage at [email or web address], password [facility] 08/13/2014, 10:17 AM    Consultants:    Procedures:    Antibiotics:  Ceftriaxone 4/2 >>  Azithromycin 4/2 >>  HPI/Subjective: Feeling better, breathing better. Ate breakfast. No pain.  Objective: Filed Vitals:   08/12/14 2030 08/12/14 2115 08/13/14 0017 08/13/14 0602  BP: 103/60 107/55  107/54  Pulse: 94 71 81 58  Temp:  99.7 F (37.6 C)  98.5 F (36.9 C)  TempSrc:  Oral  Oral  Resp: 15 18 20 20   Height:  4\' 6"  (1.497 m)    Weight:  43.5 kg (95 lb 14.4 oz)    SpO2: 97% 97% 97% 99%    Intake/Output Summary (Last 24 hours) at 08/13/14 1017 Last data filed at 08/13/14 0946  Gross per 24 hour  Intake 971.67 ml  Output      0 ml  Net 971.67 ml     Filed Weights   08/12/14 1459 08/12/14 2115  Weight: 42.185 kg (93 lb) 43.5 kg (95 lb 14.4 oz)    Exam:     Afebrile, vital  signs are stable. SPO2 97% on 2 L General: Appears calm and comfortable Cardiovascular: RRR, no m/r/g. 1+ bilateral LE edema. Respiratory: CTA bilaterally, no w/r/r. Normal respiratory effort. Speaks in full sentences Skin: bruising noted bilateral legs Psychiatric: grossly normal mood and affect, speech fluent and appropriate  New data reviewed:  No leukocytosis. Hemoglobin 10.1, somewhat decreased from admission.  Pertinent data since admission:  Lactic acid within normal limits on admission  Complete metabolic panel was unremarkable on admission  Hemoglobin 12.3 >> 10.1  Urinalysis was equivocal.  Chest x-ray 4/2: Mild patchy bibasilar airspace disease, consider pneumonia, severe rightward scoliosis.  EKG sinus rhythm, no acute changes  Pending data:  Urine culture  Scheduled Meds: . sodium chloride   Intravenous STAT  . antiseptic oral rinse  7 mL Mouth Rinse BID  . azithromycin  500 mg Oral Q24H  . cefTRIAXone (ROCEPHIN)  IV  1 g Intravenous Q24H  . enoxaparin (LOVENOX) injection  30 mg Subcutaneous Q24H  . potassium chloride  20 mEq Oral Daily  . predniSONE  10 mg Oral Q breakfast   Continuous Infusions:   Principal Problem:   CAP (community acquired pneumonia) Active Problems:   Hypotension   Acute respiratory failure with hypoxia   Macrocytic anemia   Chronic diastolic CHF (congestive heart failure)   Time spent 20 minutes

## 2014-08-13 NOTE — Progress Notes (Signed)
UR completed 

## 2014-08-13 NOTE — Progress Notes (Signed)
Assisted patient to bathroom, refused BSC,  Voided but missed cap for urine collection.  Back rounded and patient reported discomfort.  Assisted back to bed, placed pillow to back for comfort.  Patient reported will use bedpan or BSC next void.  Waiting urine specimen per orders.

## 2014-08-14 DIAGNOSIS — Z7952 Long term (current) use of systemic steroids: Secondary | ICD-10-CM | POA: Diagnosis not present

## 2014-08-14 DIAGNOSIS — Z8711 Personal history of peptic ulcer disease: Secondary | ICD-10-CM | POA: Diagnosis not present

## 2014-08-14 DIAGNOSIS — I5032 Chronic diastolic (congestive) heart failure: Secondary | ICD-10-CM | POA: Diagnosis not present

## 2014-08-14 DIAGNOSIS — D539 Nutritional anemia, unspecified: Secondary | ICD-10-CM | POA: Diagnosis present

## 2014-08-14 DIAGNOSIS — J189 Pneumonia, unspecified organism: Secondary | ICD-10-CM | POA: Diagnosis not present

## 2014-08-14 DIAGNOSIS — M1990 Unspecified osteoarthritis, unspecified site: Secondary | ICD-10-CM | POA: Diagnosis present

## 2014-08-14 DIAGNOSIS — R0602 Shortness of breath: Secondary | ICD-10-CM | POA: Diagnosis present

## 2014-08-14 DIAGNOSIS — Z87442 Personal history of urinary calculi: Secondary | ICD-10-CM | POA: Diagnosis not present

## 2014-08-14 DIAGNOSIS — K5900 Constipation, unspecified: Secondary | ICD-10-CM | POA: Diagnosis present

## 2014-08-14 DIAGNOSIS — J9601 Acute respiratory failure with hypoxia: Secondary | ICD-10-CM | POA: Diagnosis not present

## 2014-08-14 DIAGNOSIS — Z79891 Long term (current) use of opiate analgesic: Secondary | ICD-10-CM | POA: Diagnosis not present

## 2014-08-14 DIAGNOSIS — Z809 Family history of malignant neoplasm, unspecified: Secondary | ICD-10-CM | POA: Diagnosis not present

## 2014-08-14 DIAGNOSIS — E876 Hypokalemia: Secondary | ICD-10-CM | POA: Diagnosis present

## 2014-08-14 DIAGNOSIS — Z87891 Personal history of nicotine dependence: Secondary | ICD-10-CM | POA: Diagnosis not present

## 2014-08-14 LAB — LEGIONELLA ANTIGEN, URINE

## 2014-08-14 LAB — CBC
HCT: 31.1 % — ABNORMAL LOW (ref 36.0–46.0)
Hemoglobin: 10.2 g/dL — ABNORMAL LOW (ref 12.0–15.0)
MCH: 33.2 pg (ref 26.0–34.0)
MCHC: 32.8 g/dL (ref 30.0–36.0)
MCV: 101.3 fL — AB (ref 78.0–100.0)
Platelets: 181 10*3/uL (ref 150–400)
RBC: 3.07 MIL/uL — ABNORMAL LOW (ref 3.87–5.11)
RDW: 14.3 % (ref 11.5–15.5)
WBC: 6.3 10*3/uL (ref 4.0–10.5)

## 2014-08-14 MED ORDER — FUROSEMIDE 20 MG PO TABS
10.0000 mg | ORAL_TABLET | Freq: Every day | ORAL | Status: DC
  Administered 2014-08-14: 10 mg via ORAL
  Filled 2014-08-14 (×2): qty 1

## 2014-08-14 MED ORDER — TORSEMIDE 5 MG PO TABS: 5.0000 mg | ORAL_TABLET | Freq: Every day | ORAL | Status: DC

## 2014-08-14 NOTE — Progress Notes (Signed)
[facility] PROGRESS NOTE  [name] WUJ:[id number] DOB: December 24, 1918 DOA: 08/12/2014 PCP: [name], MD  Assessment/Plan:  Acute hypoxic respiratory failure. Resolved this am.   CAP, max temp 99, hemodynamically stable. No leukocytosis. Strep pneumoniae urine antigen negative. Legionella urine antigen pending.  Continue ceftriaxone and azithromycin day #3. Will narrow at discharge likely tomorrow  Hypotension on chronic steroids. Resolved. Will resume home torsemide.   Macrocytic anemia. Stable.  Consider anemia of acute illness. Follow up OP.   Chronic diastolic congestive heart failure. remains compensated. Volume status +111.7 monitor  OA on chronic prednisone stable at baseline. Evaluated by PT who recommend SNF. Social work consult for assistance with placement  Chronic constipation: continue bowel regimen  Code Status: full Family Communication: daughter [name] on phone Disposition Plan: likely snf tomorrow   Consultants:  none  Procedures:  none  Antibiotics:  Ceftriaxone 4/2 >>  Azithromycin 4/2 >>  HPI/Subjective: Sitting in chair and reports feeling "much better". Denies sob  Objective: Filed Vitals:   08/14/14 0400  BP: 117/57  Pulse: 71  Temp: 99 F (37.2 C)  Resp: 20    Intake/Output Summary (Last 24 hours) at 08/14/14 1354 Last data filed at 08/14/14 0350  Gross per 24 hour  Intake    650 ml  Output   1450 ml  Net   -800 ml   Filed Weights   08/12/14 1459 08/12/14 2115 08/14/14 0400  Weight: 42.185 kg (93 lb) 43.5 kg (95 lb 14.4 oz) 40.098 kg (88 lb 6.4 oz)    Exam:   General:  Well nourished appears comfortable pleasant HOH  Cardiovascular: RRR no m/g/r trace-1+LE edema  Respiratory: mild increased work of breathing with conversation but can complete sentences.  Diffuse faint coarseness with faint expiratory wheeze on right. No crackles  Abdomen: obese soft +BS non-tender to palpation  Musculoskeletal: no clubbing  or cyanosis   Data Reviewed: Basic Metabolic Panel:  Recent Labs Lab 08/12/14 1718  NA 142  K 3.6  CL 107  CO2 25  GLUCOSE 71  BUN 10  CREATININE 0.56  CALCIUM 8.7   Liver Function Tests:  Recent Labs Lab 08/12/14 1718  AST 21  ALT 11  ALKPHOS 55  BILITOT 1.0  PROT 5.9*  ALBUMIN 3.4*   No results for input(s): LIPASE, AMYLASE in the last 168 hours. No results for input(s): AMMONIA in the last 168 hours. CBC:  Recent Labs Lab 08/12/14 1718 08/13/14 0656 08/14/14 0545  WBC 9.8 8.8 6.3  NEUTROABS 7.6  --   --   HGB 12.3 10.1* 10.2*  HCT 39.0 31.3* 31.1*  MCV 102.9* 103.6* 101.3*  PLT 218 183 181   Cardiac Enzymes: No results for input(s): CKTOTAL, CKMB, CKMBINDEX, TROPONINI in the last 168 hours. BNP (last 3 results) No results for input(s): BNP in the last 8760 hours.  ProBNP (last 3 results) No results for input(s): PROBNP in the last 8760 hours.  CBG: No results for input(s): GLUCAP in the last 168 hours.  Recent Results (from the past 240 hour(s))  Blood Culture (routine x 2)     Status: None (Preliminary result)   Collection Time: 08/12/14  5:18 PM  Result Value Ref Range Status   Specimen Description BLOOD RIGHT ANTECUBITAL  Final   Special Requests BOTTLES DRAWN AEROBIC AND ANAEROBIC [place]  Final   Culture NO GROWTH 2 DAYS  Final   Report Status PENDING  Incomplete  Blood Culture (routine x 2)     Status:  None (Preliminary result)   Collection Time: 08/12/14  5:26 PM  Result Value Ref Range Status   Specimen Description BLOOD LEFT ANTECUBITAL  Final   Special Requests BOTTLES DRAWN AEROBIC AND ANAEROBIC [place]  Final   Culture NO GROWTH 2 DAYS  Final   Report Status PENDING  Incomplete     Studies: Dg Chest Portable 1 View  08/12/2014   CLINICAL DATA:  Shortness of breath, fever  EXAM: PORTABLE CHEST - 1 VIEW  COMPARISON:  07/29/2013  FINDINGS: Severe rightward scoliosis of the thoracic spine is reidentified. The patient is rotated to the left.  Severe left glenohumeral joint degenerative change. Marked enlargement of the cardiomediastinal silhouette reidentified. Patchy bibasilar airspace opacities are identified. Trace if any pleural fluid.  IMPRESSION: Mild patchy bibasilar airspace opacities which could indicate pneumonia or other alveolar filling process.   Electronically Signed   By: Conchita Paris M.D.   On: 08/12/2014 16:20    Scheduled Meds: . antiseptic oral rinse  7 mL Mouth Rinse BID  . azithromycin  500 mg Oral Q24H  . cefTRIAXone (ROCEPHIN)  IV  1 g Intravenous Q24H  . enoxaparin (LOVENOX) injection  30 mg Subcutaneous Q24H  . polyethylene glycol  17 g Oral BID  . potassium chloride  20 mEq Oral Daily  . predniSONE  10 mg Oral Q breakfast   Continuous Infusions:   Principal Problem:   CAP (community acquired pneumonia) Active Problems:   Hypotension   Acute respiratory failure with hypoxia   Macrocytic anemia   Chronic diastolic CHF (congestive heart failure)    Time spent: 30 minutes    [facility] Pager [phone]. If 7PM-7AM, please contact night-coverage at [email or web address], password [facility] 08/14/2014, 1:54 PM

## 2014-08-14 NOTE — Care Management Note (Addendum)
    Page 1 of 1   08/15/2014     10:59:00 AM CARE MANAGEMENT NOTE 08/15/2014  Patient:  [name], [name]   Account Number:  [id number]  Date Initiated:  08/14/2014  Documentation initiated by:  Jolene Provost  Subjective/Objective Assessment:   Pt is from home. Pt independent at baseline with assistance with housework. Pt uses a walker. Per PT recommendations, pt plans to dsicharge to SNF. CSW is aware and arranging for placement. No CM needs.     Action/Plan:   Anticipated DC Date:  08/15/2014   Anticipated DC Plan:  SKILLED NURSING FACILITY  In-house referral  Clinical Social Worker      DC Planning Services  CM consult      Choice offered to / List presented to:             Status of service:  Completed, signed off Medicare Important Message given?  YES (If response is "NO", the following Medicare IM given date fields will be blank) Date Medicare IM given:  08/15/2014 Medicare IM given by:  Jolene Provost Date Additional Medicare IM given:   Additional Medicare IM given by:    Discharge Disposition:  [place]  Per UR Regulation:    If discussed at Long Length of Stay Meetings, dates discussed:    Comments:  08/15/2014 [place], RN, MSN, CM Pt discharging to SNF today. Pt concerned that she will loose weight at the [facility] CSW called and left message with [facility] making them aware of pt's concerns. No CM needs at discharge.  08/14/2014 [place], RN, MSN, CM

## 2014-08-14 NOTE — Progress Notes (Signed)
UR completed 

## 2014-08-14 NOTE — Clinical Social Work Placement (Signed)
Clinical Social Work Department CLINICAL SOCIAL WORK PLACEMENT NOTE 08/14/2014  Patient:  [name], [name]  Account Number:  [id number] Admit date:  08/12/2014  Clinical Social Worker:  Benay Pike, LCSW  Date/time:  08/14/2014 02:08 PM  Clinical Social Work is seeking post-discharge placement for this patient at the following level of care:   [place]   (*CSW will update this form in Epic as items are completed)   08/14/2014  Patient/family provided with [place] Department of Clinical Social Work's list of facilities offering this level of care within the geographic area requested by the patient (or if unable, by the patient's family).  08/14/2014  Patient/family informed of their freedom to choose among providers that offer the needed level of care, that participate in Medicare, Medicaid or managed care program needed by the patient, have an available bed and are willing to accept the patient.  08/14/2014  Patient/family informed of [facility]' ownership interest in [facility], as well as of the fact that they are under no obligation to receive care at this facility.  PASARR submitted to EDS on  PASARR number received on   FL2 transmitted to all facilities in geographic area requested by pt/family on  08/14/2014 FL2 transmitted to all facilities within larger geographic area on   Patient informed that his/her managed care company has contracts with or will negotiate with  certain facilities, including the following:     Patient/family informed of bed offers received:   Patient chooses bed at  Physician recommends and patient chooses bed at    Patient to be transferred to  on   Patient to be transferred to facility by  Patient and family notified of transfer on  Name of family member notified:    The following physician request were entered in Epic:   Additional Comments: Pt has existing pasarr.  Benay Pike, [place]

## 2014-08-14 NOTE — Evaluation (Signed)
Physical Therapy Evaluation Patient Details Name: [name] MRN: [id number] DOB: 06/20/1918 Today's Date: 08/14/2014   History of Present Illness  With a history of arthritis requiring chronic steroid use, grade 1 diastolic heart failure on echocardiogram in August 2014. Patient seen for 10 days of worsening dyspnea particularly with exertion that has been worse over the past 2 days. Patient has a productive cough with white phlegm, has been worse over the past 2 days. Dyspnea is improved with rest. The patient had a choking spell earlier today when she was unable to expectorate the phlegm. Her family brought her to the emergency department for evaluation after this episode.   Clinical Impression   Pt is a 81 year old female who lives alone and now admitted with pneumonia.  She has severe spinal deformity with limited use of both shoulders due to severe OA.  She has had a right THR with revision and left TKR.  Pt has been independent with ADLs and ambulates with a rolling walker.  Currently, pt is significantly deconditioned and needs assistance with transfers in and out of a bed and chair.  She is able to ambulate 79' with a walker, very labored gait pattern.  I am recommending that pt go to SNF at d/c.  For the long range, she should not be living alone and this was discussed.  Pt is considering a move to [place] at some point to live near her children.    Follow Up Recommendations SNF    Equipment Recommendations  None recommended by PT    Recommendations for Other Services   OT    Precautions / Restrictions Precautions Precautions: Fall Restrictions Weight Bearing Restrictions: No      Mobility  Bed Mobility Overal bed mobility: Needs Assistance Bed Mobility: Supine to Sit;Sit to Supine     Supine to sit: Min assist;HOB elevated Sit to supine: Min assist;HOB elevated      Transfers Overall transfer level: Needs assistance Equipment used: Rolling walker (2  wheeled) Transfers: Sit to/from Stand Sit to Stand: Min assist            Ambulation/Gait Ambulation/Gait assistance: Min assist Ambulation Distance (Feet): 50 Feet Assistive device: Rolling walker (2 wheeled) Gait Pattern/deviations: Trunk flexed;Decreased stance time - left Gait velocity: appropriate for situation   General Gait Details: RLE is shorter than LLE and pt usually uses a lift on right shoe...pt leans both forearms on walker due to severe UE weakness                    Balance Overall balance assessment: No apparent balance deficits (not formally assessed)                                           Pertinent Vitals/Pain Pain Assessment: 0-10 Pain Score: 8  Pain Location: shoulders Pain Descriptors / Indicators: Throbbing Pain Intervention(s): Limited activity within patient's tolerance;Patient requesting pain meds-RN notified    Home Living Family/patient expects to be discharged to:: Skilled nursing facility Living Arrangements: Alone                    Prior Function Level of Independence: Independent with assistive device(s)         Comments: ambulates with a walker             Extremity/Trunk Assessment   Upper Extremity Assessment:  Generalized weakness (severe limitation of shoulder ROM)           Lower Extremity Assessment: Generalized weakness      Cervical / Trunk Assessment: Kyphotic  Communication   Communication: HOH  Cognition Arousal/Alertness: Awake/alert Behavior During Therapy: WFL for tasks assessed/performed Overall Cognitive Status: Within Functional Limits for tasks assessed                                    Assessment/Plan    PT Assessment Patient needs continued PT services  PT Diagnosis Difficulty walking;Generalized weakness   PT Problem List Decreased strength;Decreased activity tolerance;Decreased mobility;Cardiopulmonary status limiting activity  PT  Treatment Interventions Gait training;Functional mobility training;Therapeutic exercise   PT Goals (Current goals can be found in the Care Plan section) Acute Rehab PT Goals Patient Stated Goal: return home PT Goal Formulation: With patient Time For Goal Achievement: 08/28/14 Potential to Achieve Goals: Good    Frequency Min 3X/week   Barriers to discharge Decreased caregiver support lives alone                   End of Session Equipment Utilized During Treatment: Gait belt (O2 sat =95% on room air) Activity Tolerance: Patient tolerated treatment well Patient left: in chair;with call bell/phone within reach Nurse Communication: Mobility status    Functional Assessment Tool Used: clinical judgement Functional Limitation: Mobility: Walking and moving around Mobility: Walking and Moving Around Current Status (C5852): At least 20 percent but less than 40 percent impaired, limited or restricted Mobility: Walking and Moving Around Goal Status [phone]): At least 20 percent but less than 40 percent impaired, limited or restricted    Time: 2353-6144 PT Time Calculation (min) (ACUTE ONLY): 66 min   Charges:   PT Evaluation $Initial PT Evaluation Tier I: 1 Procedure     PT G Codes:   PT G-Codes **NOT FOR INPATIENT CLASS** Functional Assessment Tool Used: clinical judgement Functional Limitation: Mobility: Walking and moving around Mobility: Walking and Moving Around Current Status (R1540): At least 20 percent but less than 40 percent impaired, limited or restricted Mobility: Walking and Moving Around Goal Status [phone]): At least 20 percent but less than 40 percent impaired, limited or restricted    Sable Feil 08/14/2014, 12:08 PM

## 2014-08-14 NOTE — Clinical Social Work Psychosocial (Signed)
Clinical Social Work Department BRIEF PSYCHOSOCIAL ASSESSMENT 08/14/2014  Patient:  [name], [name]     Account Number:  [id number]     Admit date:  08/12/2014  Clinical Social Worker:  Wyatt Haste  Date/Time:  08/14/2014 02:10 PM  Referred by:  RN  Date Referred:  08/14/2014 Referred for  SNF Placement   Other Referral:   Interview type:  Patient Other interview type:   Peter Congo- daughter    PSYCHOSOCIAL DATA Living Status:  ALONE Admitted from facility:   Level of care:   Primary support name:  Peter Congo Primary support relationship to patient:  CHILD, ADULT Degree of support available:   supportive, but lives out of state    CURRENT CONCERNS Current Concerns  Post-Acute Placement   Other Concerns:    SOCIAL WORK ASSESSMENT / PLAN CSW met with pt at bedside. Pt alert and oriented and reports she lives alone. Pt has two daughters, Peter Congo and Dwana Melena who live out of state. Both are in [place] right now, but [place] lives in [place] in the summer. Peter Congo called CSW this morning and said she has been concerned for some time now about her mother living alone. Pt has always tried to reassure them that she is fine by saying she has a [employer] in case she needs help. Pt came to ED due to cough and shortness of breath. Admitted with pneumonia. Pt reports she feels she manages fine at home. She has a walker. Pt is generally able to fix simple meals and has hired someone for Friday afternoons to assist with cleaning, grocery shopping, and appointments as pt quit driving before Christmas. Pt admits that she is weak and should not return home right away. PT evaluated pt and recommendation is for SNF. Pt is aware of this and states she is "not happy about it." She has been to [facility] and [facility] before. Pt requests [place] only at this time. SNF list provided. Peter Congo is also aware and relieved that pt is agreeable.   Assessment/plan status:  Psychosocial Support/Ongoing Assessment of Needs Other  assessment/ plan:   Information/referral to community resources:   SNF list    PATIENT'S/FAMILY'S RESPONSE TO PLAN OF CARE: Pt agreeable to short term SNF at d/c. She states her daughter will be coming to [place] on her way home from [place] soon. Pt may consider going to [place] with her at that time.       Benay Pike, [place]

## 2014-08-14 NOTE — Progress Notes (Addendum)
When PT went to patient's room this morning, patient was off O2.  RA O2 sats were 95%.  Patient has remained off oxygen and tolerated well.  Will continue to monitor patient

## 2014-08-15 ENCOUNTER — Inpatient Hospital Stay
Admission: RE | Admit: 2014-08-15 | Discharge: 2014-09-07 | Disposition: A | Discharge status: Home / Self Care | Payer: Medicare Other | Source: Ambulatory Visit | Attending: Internal Medicine | Admitting: Internal Medicine

## 2014-08-15 DIAGNOSIS — R0602 Shortness of breath: Principal | ICD-10-CM

## 2014-08-15 DIAGNOSIS — R609 Edema, unspecified: Secondary | ICD-10-CM

## 2014-08-15 LAB — CBC
HEMATOCRIT: 34.5 % — AB (ref 36.0–46.0)
HEMOGLOBIN: 11.1 g/dL — AB (ref 12.0–15.0)
MCH: 32.7 pg (ref 26.0–34.0)
MCHC: 32.2 g/dL (ref 30.0–36.0)
MCV: 101.8 fL — AB (ref 78.0–100.0)
Platelets: 202 10*3/uL (ref 150–400)
RBC: 3.39 MIL/uL — AB (ref 3.87–5.11)
RDW: 14.2 % (ref 11.5–15.5)
WBC: 7.2 10*3/uL (ref 4.0–10.5)

## 2014-08-15 LAB — BASIC METABOLIC PANEL WITH GFR
Anion gap: 8 (ref 5–15)
BUN: 9 mg/dL (ref 6–23)
CO2: 27 mmol/L (ref 19–32)
Calcium: 8.5 mg/dL (ref 8.4–10.5)
Chloride: 109 mmol/L (ref 96–112)
Creatinine, Ser: 0.63 mg/dL (ref 0.50–1.10)
GFR calc Af Amer: 87 mL/min — ABNORMAL LOW
GFR calc non Af Amer: 75 mL/min — ABNORMAL LOW
Glucose, Bld: 76 mg/dL (ref 70–99)
Potassium: 3.2 mmol/L — ABNORMAL LOW (ref 3.5–5.1)
Sodium: 144 mmol/L (ref 135–145)

## 2014-08-15 LAB — URINE CULTURE: Colony Count: 50000

## 2014-08-15 MED ORDER — LEVOFLOXACIN 750 MG PO TABS: 750.0000 mg | ORAL_TABLET | Freq: Every day | ORAL | Status: DC

## 2014-08-15 MED ORDER — POTASSIUM CHLORIDE CRYS ER 20 MEQ PO TBCR
40.0000 meq | EXTENDED_RELEASE_TABLET | Freq: Once | ORAL | Status: AC
  Administered 2014-08-15: 40 meq via ORAL
  Filled 2014-08-15: qty 2

## 2014-08-15 MED ORDER — OXYCODONE-ACETAMINOPHEN 5-325 MG PO TABS: 0.5000 | ORAL_TABLET | ORAL | Status: DC | PRN

## 2014-08-15 NOTE — Discharge Summary (Signed)
Physician Discharge Summary  [name] JHE:[id number] DOB: 09-24-1918 DOA: 08/12/2014  PCP: Martha Cahill, MD  Admit date: 08/12/2014 Discharge date: 08/15/2014  Time spent: 40 minutes  Recommendations for Outpatient Follow-up:  1. Follow up with [name] 1-2 weeks for evaluation of resolution PNA and functional status. [place] for strength and endurance.   Discharge Diagnoses:  Principal Problem:   CAP (community acquired pneumonia) Active Problems:   Hypotension   Acute respiratory failure with hypoxia   Macrocytic anemia   Chronic diastolic CHF (congestive heart failure)   Community acquired pneumonia   Discharge Condition: stable  Diet recommendation: heart healthy  Filed Weights   08/12/14 2115 08/14/14 0400 08/15/14 0555  Weight: 43.5 kg (95 lb 14.4 oz) 40.098 kg (88 lb 6.4 oz) 41.8 kg (92 lb 2.4 oz)    History of present illness:  [name] is a 81 y.o. Female with a history of arthritis requiring chronic steroid use, grade 1 diastolic heart failure on echocardiogram in August 2014. Patient seen for 10 day hx of worsening dyspnea particularly with exertion that had been worse over the prior 2 days. Patient had a productive cough with white phlegm. Dyspnea  improved with rest. The patient had a choking spell  when she was unable to expectorate the phlegm. Her family brought her to the emergency department for evaluation after this episode.   Hospital Course:   Acute hypoxic respiratory failure. Resolved at discharge. Oxygen saturation level >90% on room air.   CAP, max temp 99, remained hemodynamically stable. No leukocytosis. Strep pneumoniae urine antigen negative. Legionella urine antigen negative. recieved received  ceftriaxone and azithromycin for 3 days. Will discharge with levaquin for 4 days to complete 7 day course.   Hypotension on chronic steroids. Resolved. Torsemide initially held then resumed 08/14/14.    Macrocytic anemia. Stable.  Consider anemia of acute illness. Follow up OP.   Chronic diastolic congestive heart failure. remained compensated. Volume status -1.2L on discharge.   OA on chronic prednisone. remained stable at baseline. Evaluated by PT who recommend SNF.   Chronic constipation: continue bowel regimen. 2 BM's during night before discharge.   Procedures:  none  Consultations:  none  Discharge Exam: Filed Vitals:   08/15/14 0555  BP: 130/61  Pulse: 60  Temp: 98.3 F (36.8 C)  Resp: 20    General: somewhat frail appears comfortable Cardiovascular: RRR no MGR trace LE edema PPP Respiratory: normal effort good airflow. End expiratory rhonchi in bases. No crackles  Discharge Instructions    Current Discharge Medication List    START taking these medications   Details  levofloxacin (LEVAQUIN) 750 MG tablet Take 1 tablet (750 mg total) by mouth daily. Qty: 4 tablet, Refills: 0      CONTINUE these medications which have CHANGED   Details  oxyCODONE-acetaminophen (PERCOCET/ROXICET) 5-325 MG per tablet Take 0.5-1 tablets by mouth every 4 (four) hours as needed for moderate pain or severe pain (pain). Qty: 30 tablet, Refills: 0      CONTINUE these medications which have NOT CHANGED   Details  acetaminophen (TYLENOL) 500 MG tablet Take 500 mg by mouth every 6 (six) hours as needed for pain.    Multiple Vitamin (MULTIVITAMIN WITH MINERALS) TABS tablet Take 1 tablet by mouth daily.    potassium chloride (KLOR-CON) 20 MEQ packet Take 20 mEq by mouth daily. Mixed with water/orange juice    predniSONE (DELTASONE) 10 MG tablet Take 10 mg by mouth daily with breakfast.  saccharomyces boulardii (FLORASTOR) 250 MG capsule Take 250 mg by mouth 2 (two) times daily.    torsemide (DEMADEX) 10 MG tablet Take 5 mg by mouth daily.      STOP taking these medications     cephALEXin (KEFLEX) 500 MG capsule        Allergies  Allergen Reactions  . Sulfa Antibiotics   . Aspirin Other (See  Comments)    Makes stomach burn      The results of significant diagnostics from this hospitalization (including imaging, microbiology, ancillary and laboratory) are listed below for reference.    Significant Diagnostic Studies: Ct Abdomen Pelvis W Contrast  07/27/2014   CLINICAL DATA:  Abdominal pain and constipation for 4 days.  EXAM: CT ABDOMEN AND PELVIS WITH CONTRAST  TECHNIQUE: Multidetector CT imaging of the abdomen and pelvis was performed using the standard protocol following bolus administration of intravenous contrast.  CONTRAST:  46mL OMNIPAQUE IOHEXOL 300 MG/ML SOLN, 158mL OMNIPAQUE IOHEXOL 300 MG/ML SOLN  COMPARISON:  CT 12/31/2012  FINDINGS: There are normal appearances of the liver, spleen, pancreas and adrenals. There is a 7 x 11 mm calculus in the right renal pelvis and a 3 mm calculus in the right midpole collecting system. There is no hydronephrosis. There is no ureteral dilatation or ureteral calculus. Bowel appears unremarkable. There is no bowel obstruction. There is no perforation. There is no ascites. No acute inflammatory changes are evident in the abdomen or pelvis. Mild curvilinear scarring and slight linear atelectatic changes are present in the lung bases.  There is inferior endplate impaction at L2 with slight central loss of height. This is new from 12/31/2012. Is not acute but it could be a few weeks to several months old. No bone lesions or bony destruction evident.  IMPRESSION: 1. Right nephrolithiasis. No hydronephrosis or ureteral obstruction. 2. No acute findings are evident in the abdomen or pelvis. 3. Mild compression at L2 with inferior endplate impaction. This may be relatively recent but it is not acute.   Electronically Signed   By: Andreas Newport M.D.   On: 07/27/2014 21:28   Dg Chest Portable 1 View  08/12/2014   CLINICAL DATA:  Shortness of breath, fever  EXAM: PORTABLE CHEST - 1 VIEW  COMPARISON:  07/29/2013  FINDINGS: Severe rightward scoliosis of the  thoracic spine is reidentified. The patient is rotated to the left. Severe left glenohumeral joint degenerative change. Marked enlargement of the cardiomediastinal silhouette reidentified. Patchy bibasilar airspace opacities are identified. Trace if any pleural fluid.  IMPRESSION: Mild patchy bibasilar airspace opacities which could indicate pneumonia or other alveolar filling process.   Electronically Signed   By: Conchita Paris M.D.   On: 08/12/2014 16:20    Microbiology: Recent Results (from the past 240 hour(s))  Blood Culture (routine x 2)     Status: None (Preliminary result)   Collection Time: 08/12/14  5:18 PM  Result Value Ref Range Status   Specimen Description BLOOD RIGHT ANTECUBITAL  Final   Special Requests BOTTLES DRAWN AEROBIC AND ANAEROBIC [place]  Final   Culture NO GROWTH 2 DAYS  Final   Report Status PENDING  Incomplete  Blood Culture (routine x 2)     Status: None (Preliminary result)   Collection Time: 08/12/14  5:26 PM  Result Value Ref Range Status   Specimen Description BLOOD LEFT ANTECUBITAL  Final   Special Requests BOTTLES DRAWN AEROBIC AND ANAEROBIC [place]  Final   Culture NO GROWTH 2 DAYS  Final  Report Status PENDING  Incomplete  Urine culture     Status: None (Preliminary result)   Collection Time: 08/12/14  6:23 PM  Result Value Ref Range Status   Specimen Description URINE, CLEAN CATCH  Final   Special Requests NONE  Final   Colony Count   Final    50,000 COLONIES/ML Performed at [facility]    Culture   Final    ENTEROCOCCUS SPECIES Performed at [facility]    Report Status PENDING  Incomplete     Labs: Basic Metabolic Panel:  Recent Labs Lab 08/12/14 1718 08/15/14 0529  NA 142 144  K 3.6 3.2*  CL 107 109  CO2 25 27  GLUCOSE 71 76  BUN 10 9  CREATININE 0.56 0.63  CALCIUM 8.7 8.5   Liver Function Tests:  Recent Labs Lab 08/12/14 1718  AST 21  ALT 11  ALKPHOS 55  BILITOT 1.0  PROT 5.9*  ALBUMIN 3.4*   No  results for input(s): LIPASE, AMYLASE in the last 168 hours. No results for input(s): AMMONIA in the last 168 hours. CBC:  Recent Labs Lab 08/12/14 1718 08/13/14 0656 08/14/14 0545 08/15/14 0529  WBC 9.8 8.8 6.3 7.2  NEUTROABS 7.6  --   --   --   HGB 12.3 10.1* 10.2* 11.1*  HCT 39.0 31.3* 31.1* 34.5*  MCV 102.9* 103.6* 101.3* 101.8*  PLT 218 183 181 202   Cardiac Enzymes: No results for input(s): CKTOTAL, CKMB, CKMBINDEX, TROPONINI in the last 168 hours. BNP: BNP (last 3 results) No results for input(s): BNP in the last 8760 hours.  ProBNP (last 3 results) No results for input(s): PROBNP in the last 8760 hours.  CBG: No results for input(s): GLUCAP in the last 168 hours.     SignedRadene Gunning  [facility] 08/15/2014, 9:46 AM

## 2014-08-15 NOTE — Progress Notes (Signed)
Pt transported to [facility] around 3pm this afternoon. Report given to nurse receiving pt at [facility]. IV removed. Pt has all of her belongings

## 2014-08-15 NOTE — Clinical Social Work Placement (Signed)
Clinical Social Work Department CLINICAL SOCIAL WORK PLACEMENT NOTE 08/15/2014  Patient:  [name], [name]  Account Number:  [id number] Admit date:  08/12/2014  Clinical Social Worker:  Benay Pike, LCSW  Date/time:  08/14/2014 02:08 PM  Clinical Social Work is seeking post-discharge placement for this patient at the following level of care:   [place]   (*CSW will update this form in Epic as items are completed)   08/14/2014  Patient/family provided with [place] Department of Clinical Social Work's list of facilities offering this level of care within the geographic area requested by the patient (or if unable, by the patient's family).  08/14/2014  Patient/family informed of their freedom to choose among providers that offer the needed level of care, that participate in Medicare, Medicaid or managed care program needed by the patient, have an available bed and are willing to accept the patient.  08/14/2014  Patient/family informed of [facility]' ownership interest in [facility], as well as of the fact that they are under no obligation to receive care at this facility.  PASARR submitted to EDS on  PASARR number received on   FL2 transmitted to all facilities in geographic area requested by pt/family on  08/14/2014 FL2 transmitted to all facilities within larger geographic area on   Patient informed that his/her managed care company has contracts with or will negotiate with  certain facilities, including the following:     Patient/family informed of bed offers received:  08/15/2014 Patient chooses bed at [facility] Physician recommends and patient chooses bed at    Patient to be transferred to [facility] on  08/15/2014 Patient to be transferred to facility by staff Patient and family notified of transfer on 08/15/2014 Name of family member notified:  Peter Congo- daughter notified by voicemail  The following physician request were entered in  Epic:   Additional Comments: Pt has existing pasarr.  Benay Pike, [place]

## 2014-08-15 NOTE — Clinical Social Work Note (Signed)
Pt d/c today to SNF. Pt accepts bed at [facility]. Facility notified. CSW left voicemail for pt's daughter, Peter Congo regarding d/c. Pt to transfer with staff.  Benay Pike, [place]

## 2014-08-16 ENCOUNTER — Non-Acute Institutional Stay (SKILLED_NURSING_FACILITY): Payer: Medicare Other | Admitting: Internal Medicine

## 2014-08-16 ENCOUNTER — Ambulatory Visit (HOSPITAL_COMMUNITY): Payer: Medicare Other | Attending: Internal Medicine

## 2014-08-16 ENCOUNTER — Other Ambulatory Visit (HOSPITAL_COMMUNITY)
Admission: RE | Admit: 2014-08-16 | Discharge: 2014-08-16 | Disposition: A | Discharge status: Home / Self Care | Payer: Medicare Other | Source: Skilled Nursing Facility | Attending: Internal Medicine | Admitting: Internal Medicine

## 2014-08-16 DIAGNOSIS — R05 Cough: Secondary | ICD-10-CM | POA: Insufficient documentation

## 2014-08-16 DIAGNOSIS — R29898 Other symptoms and signs involving the musculoskeletal system: Secondary | ICD-10-CM | POA: Diagnosis not present

## 2014-08-16 DIAGNOSIS — J9620 Acute and chronic respiratory failure, unspecified whether with hypoxia or hypercapnia: Secondary | ICD-10-CM

## 2014-08-16 DIAGNOSIS — I959 Hypotension, unspecified: Secondary | ICD-10-CM | POA: Insufficient documentation

## 2014-08-16 DIAGNOSIS — J189 Pneumonia, unspecified organism: Secondary | ICD-10-CM

## 2014-08-16 DIAGNOSIS — R0602 Shortness of breath: Secondary | ICD-10-CM | POA: Insufficient documentation

## 2014-08-16 DIAGNOSIS — I5031 Acute diastolic (congestive) heart failure: Secondary | ICD-10-CM

## 2014-08-16 DIAGNOSIS — I5032 Chronic diastolic (congestive) heart failure: Secondary | ICD-10-CM | POA: Diagnosis present

## 2014-08-16 LAB — CBC
HEMATOCRIT: 36.2 % (ref 36.0–46.0)
HEMOGLOBIN: 11.4 g/dL — AB (ref 12.0–15.0)
MCH: 32.5 pg (ref 26.0–34.0)
MCHC: 31.5 g/dL (ref 30.0–36.0)
MCV: 103.1 fL — ABNORMAL HIGH (ref 78.0–100.0)
PLATELETS: 199 10*3/uL (ref 150–400)
RBC: 3.51 MIL/uL — AB (ref 3.87–5.11)
RDW: 14.2 % (ref 11.5–15.5)
WBC: 8.5 10*3/uL (ref 4.0–10.5)

## 2014-08-16 LAB — BASIC METABOLIC PANEL
ANION GAP: 9 (ref 5–15)
BUN: 10 mg/dL (ref 6–23)
CHLORIDE: 109 mmol/L (ref 96–112)
CO2: 27 mmol/L (ref 19–32)
Calcium: 9.1 mg/dL (ref 8.4–10.5)
Creatinine, Ser: 0.69 mg/dL (ref 0.50–1.10)
GFR calc non Af Amer: 73 mL/min — ABNORMAL LOW (ref >90)
GFR, EST AFRICAN AMERICAN: 84 mL/min — AB (ref >90)
Glucose, Bld: 141 mg/dL — ABNORMAL HIGH (ref 70–99)
Potassium: 3.8 mmol/L (ref 3.5–5.1)
Sodium: 145 mmol/L (ref 135–145)

## 2014-08-16 NOTE — Progress Notes (Signed)
Patient ID: [name], female   DOB: 01/31/1919, 81 y.o.   MRN: [id number]  Facility; [facility] Chief complaint; admission to SNF post admit to [facility] from 4/2 to 4/5  History; this is a 81 year old woman who lives independently in her own home. She is on chronic steroid use and is followed by Dr. Ouida Sills of rheumatology for when I'm assuming is rheumatoid arthritis. She was admitted with a 10 day history of worsening dyspnea productive cough with white phlegm. The chest x-rays showed patchy bilateral pneumonia. She had no leukocytosis. Strep and Legionella antigens were negative she was treated with ceftriaxone and azithromycin for 3 days. She was discharged on Levaquin to complete a 7 day course of antibiotics.  The patient tells me that she was completely well or at least in her usual state until a month ago. She developed a cough of thick white mucus which was worse in the morning but then cleared and she felt well for the rest of the day. This appeared to of worsened in the last week. She denies fever or chills hemoptysis chest pain. She apparently is felt to have diastolically mediated heart failure. She has had a previous echocardiogram. Her Demadex was resumed.  Apparently since her arrival here she has been noted to be coughing wheezing and short of breath. Once again she is afebrile her O2 sats of been in the normal range.  Past Medical History  Diagnosis Date  . Cancer     uterine surg only  . Arthritis   . Kidney stone   . Pancreatic atrophy 12/31/2012  . Colitis 12/31/2012    presumed C.Diff. patient declined colonoscopy   Past Surgical History  Procedure Laterality Date  . Joint replacement    . Cholecystectomy    . Right hip surgery    . Left knee surgery and revision    . Esophagogastroduodenoscopy N/A 03/03/2013    RMR: Significant gastric ulcer without bleeding stigmata requiring no endoscopic intervention today. Noncritical Schatzki's ring. Small hiatal hernia   . Esophagogastroduodenoscopy N/A 08/24/2013    Dr. [name]: Hiatal hernia.  Gastric mucosal scar formation at site of previously noted gastric ulcerations   Current Outpatient Prescriptions on File Prior to Visit  Medication Sig Dispense Refill  . acetaminophen (TYLENOL) 500 MG tablet Take 500 mg by mouth every 6 (six) hours as needed for pain.    Marland Kitchen levofloxacin (LEVAQUIN) 750 MG tablet Take 1 tablet (750 mg total) by mouth daily. 4 tablet 0  . Multiple Vitamin (MULTIVITAMIN WITH MINERALS) TABS tablet Take 1 tablet by mouth daily.    Marland Kitchen oxyCODONE-acetaminophen (PERCOCET/ROXICET) 5-325 MG per tablet Take 0.5-1 tablets by mouth every 4 (four) hours as needed for moderate pain or severe pain (pain). 30 tablet 0  . potassium chloride (KLOR-CON) 20 MEQ packet Take 20 mEq by mouth daily. Mixed with water/orange juice    . predniSONE (DELTASONE) 10 MG tablet Take 10 mg by mouth daily with breakfast.    . saccharomyces boulardii (FLORASTOR) 250 MG capsule Take 250 mg by mouth 2 (two) times daily.    Marland Kitchen torsemide (DEMADEX) 10 MG tablet Take 5 mg by mouth daily.     Social patient apparently lives on her own but is functionally independent. Uses a walker. She has no smoking history. She is limited in her exertion by pain in the right hip I gather which is had the at least 2 surgeries here.  reports that she quit smoking about 63 years ago. Her  smoking use included Cigarettes. She has never used smokeless tobacco. She reports that she drinks about 0.6 oz of alcohol per week. She reports that she does not use illicit drugs.  Family history; none of relevance   Review of systems; Gen; no weight loss Respiratory; she is short of breath wheezing and coughing still Cardiac; no exertional chest pain, no palpitations GI; no abnormal pain, no change in bowel habits GU; appears to have some voiding difficulties which almost sounded like difficulty initiating Musculoskeletal; has pain with ambulation in her right  hip. Left shoulder difficulties. She has a left leg length discrepancy. As mentioned she uses a walker.  Physical examination Vitals; O2 sat is 95% on room air respirations 28 and labored there is accessory muscle use. Pulse rate 86. Gen. patient looks younger than her stated age. She is tachypneic and dyspneic but still able to do talking give her own history HEENT; oral exam is normal Respiratory; no clubbing. She has a severe thoracic scoliosis. Air entry is reduced bilaterally her chest sounds very tight with marked expiratory wheezing Cardiac; very difficult to hear anything through the upper airway noise. I note her pansystolic murmur at the PMI which is probably mitral regurgitation her JVP is not elevated Abdomen; distended but soft bowel sounds are positive no liver no spleen no tenderness noted GU bladder is not overtly distended Musculoskeletal; either shoulder effusion on the right or perhaps a bursitis. No other active joints are noted. Neurologic; most remarkable for no antigravity strength in the right hip flexors and abductors. She is hyporeflexic. This may be a mechanical issue. If it is neurologic I can't sort out this at the bedside. This is not an upper motor neuron problem however  Impression/plan #1 respiratory distress. I wonder if this woman has asthma. She is certainly tight and bronchospastic. She will need nebulizers and I'll follow her later in the day it is likely she will need parenteral steroids. Obviously at high risk for return to the hospital. #2 chronic steroids currently on prednisone 10 mg. Her rheumatologist Dr. Ouida Sills called about reducing this. I'm presuming she is on this for rheumatoid arthritis given her history. However this is probably not the time to do this given her respiratory distress. A manic she may need parenteral steroids. #3 history of diastolic heart failure; I cannot pick up evidence of this on the exam. She is likely to need a repeat chest  x-ray. She is on Demadex 5 mg #4 right proximal lower extremity weakness. This is not present distally. I don't think this is an upper motor neuron issue however I'm not exactly sure of the diagnosis here. This may functionally limit her rehabilitation. #5 macrocytic anemia differential B-12 folate myelodysplastic syndrome; also if the B12 is been checked.      Study Result       CLINICAL DATA:  Abdominal pain and constipation for 4 days.   EXAM: CT ABDOMEN AND PELVIS WITH CONTRAST   TECHNIQUE: Multidetector CT imaging of the abdomen and pelvis was performed using the standard protocol following bolus administration of intravenous contrast.   CONTRAST:  37mL OMNIPAQUE IOHEXOL 300 MG/ML SOLN, 120mL OMNIPAQUE IOHEXOL 300 MG/ML SOLN   COMPARISON:  CT 12/31/2012   FINDINGS: There are normal appearances of the liver, spleen, pancreas and adrenals. There is a 7 x 11 mm calculus in the right renal pelvis and a 3 mm calculus in the right midpole collecting system. There is no hydronephrosis. There is no ureteral dilatation or  ureteral calculus. Bowel appears unremarkable. There is no bowel obstruction. There is no perforation. There is no ascites. No acute inflammatory changes are evident in the abdomen or pelvis. Mild curvilinear scarring and slight linear atelectatic changes are present in the lung bases.   There is inferior endplate impaction at L2 with slight central loss of height. This is new from 12/31/2012. Is not acute but it could be a few weeks to several months old. No bone lesions or bony destruction evident.   IMPRESSION: 1. Right nephrolithiasis. No hydronephrosis or ureteral obstruction. 2. No acute findings are evident in the abdomen or pelvis. 3. Mild compression at L2 with inferior endplate impaction. This may be relatively recent but it is not acute.     Electronically Signed   By: Andreas Newport M.D.   On: 07/27/2014 21:28   CLINICAL DATA:  Shortness of  breath, fever   EXAM: PORTABLE CHEST - 1 VIEW   COMPARISON:  07/29/2013   FINDINGS: Severe rightward scoliosis of the thoracic spine is reidentified. The patient is rotated to the left. Severe left glenohumeral joint degenerative change. Marked enlargement of the cardiomediastinal silhouette reidentified. Patchy bibasilar airspace opacities are identified. Trace if any pleural fluid.   IMPRESSION: Mild patchy bibasilar airspace opacities which could indicate pneumonia or other alveolar filling process.   Results for SHARONNA, VINJE (MRN [id number]) as of 08/16/2014 10:11  Ref. Range 08/12/2014 20:46 08/13/2014 06:56 08/14/2014 05:45 08/14/2014 15:16 08/15/2014 05:29  Sodium Latest Range: 135-145 mmol/L     144  Potassium Latest Range: 3.5-5.1 mmol/L     3.2 (L)  Chloride Latest Range: 96-112 mmol/L     109  CO2 Latest Range: 19-32 mmol/L     27  BUN Latest Range: 6-23 mg/dL     9  Creatinine Latest Range: 0.50-1.10 mg/dL     0.63  Calcium Latest Range: 8.4-10.5 mg/dL     8.5  GFR calc non Af Amer Latest Range: >90 mL/min     75 (L)  GFR calc Af Amer Latest Range: >90 mL/min     87 (L)  Glucose Latest Range: 70-99 mg/dL     76  Anion gap Latest Range: 5-15      8  Lactic Acid, Venous Latest Range: 0.5-2.0 mmol/L 0.93      WBC Latest Range: 4.0-10.5 K/uL  8.8 6.3  7.2  RBC Latest Range: 3.87-5.11 MIL/uL  3.02 (L) 3.07 (L)  3.39 (L)  Hemoglobin Latest Range: 12.0-15.0 g/dL  10.1 (L) 10.2 (L)  11.1 (L)  HCT Latest Range: 36.0-46.0 %  31.3 (L) 31.1 (L)  34.5 (L)  MCV Latest Range: 78.0-100.0 fL  103.6 (H) 101.3 (H)  101.8 (H)  MCH Latest Range: 26.0-34.0 pg  33.4 33.2  32.7  MCHC Latest Range: 30.0-36.0 g/dL  32.3 32.8  32.2  RDW Latest Range: 11.5-15.5 %  14.5 14.3  14.2  Platelets Latest Range: 150-400 K/uL  183 181  202  EKG No range found    Attch

## 2014-08-17 LAB — CULTURE, BLOOD (ROUTINE X 2)
CULTURE: NO GROWTH
Culture: NO GROWTH

## 2014-09-02 ENCOUNTER — Encounter: Payer: Self-pay | Admitting: Internal Medicine

## 2014-09-02 ENCOUNTER — Non-Acute Institutional Stay (SKILLED_NURSING_FACILITY): Payer: Medicare Other | Admitting: Internal Medicine

## 2014-09-02 DIAGNOSIS — I5032 Chronic diastolic (congestive) heart failure: Secondary | ICD-10-CM

## 2014-09-02 DIAGNOSIS — L03115 Cellulitis of right lower limb: Secondary | ICD-10-CM

## 2014-09-02 DIAGNOSIS — R609 Edema, unspecified: Secondary | ICD-10-CM | POA: Diagnosis not present

## 2014-09-02 DIAGNOSIS — L0390 Cellulitis, unspecified: Secondary | ICD-10-CM | POA: Insufficient documentation

## 2014-09-02 NOTE — Progress Notes (Signed)
Patient ID: [name], female   DOB: 11/15/1918, 81 y.o.   MRN: [id number]   This is an acute visit.  Level of care skilled.  Facility [facility].  Chief complaint-acute visit secondary to right leg erythema-edema lower extremity.  History of present illness.  Patient is a pleasant 81 year old female was here for rehabilitation-she was recently hospitalized for acute respiratory failure with hypoxia secondary to pneumonia she did respond to prednisone as well as nebulizers and antibiotics.  Of note she is on chronic prednisone with a history of rheumatoid arthritis.  She does have a history of diastolic CHF she is on Demadex 5 mg a day-this was held at one point in the hospital secondary to hypotension-blood pressure today is 114/68 this appears to be relatively baseline I see a machine reading of 84/38 but when I checked it manually it appeared to be more baseline  Nursing staff has noted some increased erythema of her right lower leg she does have what appears to be a couple small open areas are currently covered there is some surrounding erythema and slight warmth.  She does have some edema of her lower extremities bilaterally.  Weight on April 18 was 93.2 and appears back on April 8 was 91.6 appears average runs from 89-93.  Currently she denies any shortness of breath fever chills or cough says her breathing has improved significantly.  Family medical social history reviewed per previous notes including discharge summary 08/15/2014.  Medications have been reviewed per MAR.  Review of systems.  General does not complain of any fever or chills.  Skin as noted in history of present illness some erythema right lower leg.  Head ears eyes nose mouth and throat does not complain of visual changes or sore throat.  Respiratory says her breathing is better does not complaining cough or shortness of breath currently.  Cardiac no chest pain she does have lower extremity edema  somewhat more on the right versus the left.  GI does not complain of nausea vomiting diarrhea or constipation or abdominal pain.  Musculoskeletal does not complain of joint pain does have some weakness especially lower extremities.  Neurologic does not complain of headache or syncopal-type feelings-says she has rarely occasional short episodes of vertigol-type dizziness  .  Psych does not complain of depression or anxiety.  Physical exam.  Temperature is 98.0 pulse 70 respirations 20 blood pressure 114/68 weight is 93.2  In general this is a pleasant elderly female in no distress resting comfortably in bed.  Her skin is warm and dry I do note on the right lower leg there is an area of erythema and slight warmth there is some venous stasis changes as well she does have 2 small areas that are covered with dry dressing with small open areas there is no drainage or bleeding from these areas again the most significant finding is some increased erythema.  Oropharynx is clear mucous membranes moist.  Chest has shallow air entry but clear to auscultation there is no labored breathing.  Heart is regular rate and rhythm with a 2/6 systolic murmur she has 1+ lower extremity on the left more on the right bordering on 2+ pedal pulses are intact the edema is cool to touch is not overtly tender again there is some erythema however on the right lower leg.  Abdomen is soft nontender positive bowel sounds.  Musculoskeletal does move all extremities 4 to appears with baseline strength and range of motion limited exam since she is  in bed.  Neurologic is grossly intact her speech is clear cranial nerves intact I do not see any lateralizing findings.  Psych she is alert and oriented pleasant and appropriate.  Labs.  08/16/2014.  Sodium 145 potassium 3.8 BUN 10 creatinine 0.69.  WBC 8.5 hemoglobin 11.4 platelets 199.  Assessment and plan.  #1-right lower extremity erythema-concerns here for  cellulitis Will start doxycycline 100 mg twice a day for 7 days and monitor-also will start a probiotic twice a day for 7 days-culture any drainage--monitor for any increased erythema.  #2 history of diastolic CHF she does have some edema according in nursing staff this is fairly chronic there is somewhat more increased edema on the right versus the left Will obtain a venous Doppler when available-pedal pulse is intact again this is not acutely tender.  I do note her weight is 93.2 which is a slight increase will have patient weighed tomorrow notify provider of any weight gain also will obtain an updated metabolic panel BMP and CBC with differential  [id number].

## 2014-09-03 ENCOUNTER — Encounter (HOSPITAL_COMMUNITY)
Admission: RE | Admit: 2014-09-03 | Discharge: 2014-09-03 | Disposition: A | Discharge status: Home / Self Care | Payer: Medicare Other | Source: Skilled Nursing Facility | Attending: Internal Medicine | Admitting: Internal Medicine

## 2014-09-03 LAB — CBC WITH DIFFERENTIAL/PLATELET
BASOS PCT: 0 % (ref 0–1)
Basophils Absolute: 0 10*3/uL (ref 0.0–0.1)
EOS ABS: 0.2 10*3/uL (ref 0.0–0.7)
EOS PCT: 1 % (ref 0–5)
HEMATOCRIT: 39.3 % (ref 36.0–46.0)
HEMOGLOBIN: 12.4 g/dL (ref 12.0–15.0)
Lymphocytes Relative: 17 % (ref 12–46)
Lymphs Abs: 2.3 10*3/uL (ref 0.7–4.0)
MCH: 32.8 pg (ref 26.0–34.0)
MCHC: 31.6 g/dL (ref 30.0–36.0)
MCV: 104 fL — ABNORMAL HIGH (ref 78.0–100.0)
MONO ABS: 1.5 10*3/uL — AB (ref 0.1–1.0)
Monocytes Relative: 11 % (ref 3–12)
NEUTROS ABS: 9.5 10*3/uL — AB (ref 1.7–7.7)
NEUTROS PCT: 71 % (ref 43–77)
Platelets: 223 10*3/uL (ref 150–400)
RBC: 3.78 MIL/uL — ABNORMAL LOW (ref 3.87–5.11)
RDW: 14.9 % (ref 11.5–15.5)
WBC: 13.6 10*3/uL — AB (ref 4.0–10.5)

## 2014-09-03 LAB — BASIC METABOLIC PANEL
Anion gap: 7 (ref 5–15)
BUN: 23 mg/dL (ref 6–23)
CHLORIDE: 107 mmol/L (ref 96–112)
CO2: 30 mmol/L (ref 19–32)
Calcium: 8.9 mg/dL (ref 8.4–10.5)
Creatinine, Ser: 0.68 mg/dL (ref 0.50–1.10)
GFR calc non Af Amer: 73 mL/min — ABNORMAL LOW (ref >90)
GFR, EST AFRICAN AMERICAN: 85 mL/min — AB (ref >90)
Glucose, Bld: 86 mg/dL (ref 70–99)
POTASSIUM: 3.5 mmol/L (ref 3.5–5.1)
Sodium: 144 mmol/L (ref 135–145)

## 2014-09-03 LAB — BRAIN NATRIURETIC PEPTIDE: B Natriuretic Peptide: 148 pg/mL — ABNORMAL HIGH (ref 0.0–100.0)

## 2014-09-04 ENCOUNTER — Ambulatory Visit (HOSPITAL_COMMUNITY)
Admit: 2014-09-04 | Discharge: 2014-09-04 | Disposition: A | Discharge status: Home / Self Care | Payer: Medicare Other | Source: Ambulatory Visit | Attending: Internal Medicine | Admitting: Internal Medicine

## 2014-09-04 ENCOUNTER — Other Ambulatory Visit: Payer: Self-pay | Admitting: *Deleted

## 2014-09-04 DIAGNOSIS — M7989 Other specified soft tissue disorders: Secondary | ICD-10-CM | POA: Insufficient documentation

## 2014-09-04 DIAGNOSIS — L03115 Cellulitis of right lower limb: Secondary | ICD-10-CM | POA: Insufficient documentation

## 2014-09-04 MED ORDER — OXYCODONE-ACETAMINOPHEN 5-325 MG PO TABS: ORAL_TABLET | ORAL | Status: DC

## 2014-09-04 NOTE — Telephone Encounter (Signed)
[facility] 

## 2014-09-06 ENCOUNTER — Non-Acute Institutional Stay (SKILLED_NURSING_FACILITY): Payer: Medicare Other | Admitting: Internal Medicine

## 2014-09-06 DIAGNOSIS — J189 Pneumonia, unspecified organism: Secondary | ICD-10-CM

## 2014-09-06 DIAGNOSIS — I5032 Chronic diastolic (congestive) heart failure: Secondary | ICD-10-CM

## 2014-09-06 DIAGNOSIS — L03115 Cellulitis of right lower limb: Secondary | ICD-10-CM | POA: Diagnosis not present

## 2014-09-10 NOTE — Progress Notes (Signed)
Patient ID: [name], female   DOB: Mar 27, 1919, 81 y.o.   MRN: [id number]                PROGRESS NOTE  DATE:  09/06/2014         FACILITY: [facility]                   LEVEL OF CARE:   SNF   Acute Visit/Discharge Visit                 CHIEF COMPLAINT:  Pre-discharge review.       HISTORY OF PRESENT ILLNESS:  This is a 81 year-old woman who lives in her own home.    She was admitted to hospital with bilateral pneumonia.  She was treated with antibiotics, completed Levaquin here, and she has done reasonably well because of this.  However, when she first arrived in the building, she had significant bronchospasm and I gave her nebulizers.  She tells me that she did not have nebulizers at home.    She apparently was also seen by our service two days ago.  She apparently had some pain, swelling, and redness in her right leg.  She was treated empirically with doxycycline for cellulitis, which will complete on 09/08/2014.  A duplex ultrasound of the right leg was negative for DVT.    Today, apparently she did not eat her breakfast, but shortly thereafter felt intensely nauseated and had copious amounts of vomiting that was not obviously bloody.  She apparently vomited all of her medications.    REVIEW OF SYSTEMS:    CARDIAC:  No exertional chest pain.   GI:  She is not complaining of abdominal pain or diarrhea.    GU:  No dysuria.      PHYSICAL EXAMINATION:   GENERAL APPEARANCE:  The patient looks stable.    CHEST/RESPIRATORY:  She has a severe kyphoscoliosis.  In spite of this, her air entry is fairly good.      CARDIOVASCULAR:   CARDIAC:  Heart sounds are distant.  She appears to be euvolemic.  JVP is not elevated.      GASTROINTESTINAL:   ABDOMEN:  Soft.  Bowel sounds are positive.  No masses.     LIVER/SPLEEN/KIDNEYS:  No liver, no spleen.  No tenderness.   GENITOURINARY:   BLADDER:  No suprapubic or costovertebral angle tenderness.    ASSESSMENT/PLAN:                           Bronchitis/pneumonia when she came in.  This appears to be a lot better.  She was fairly bronchospastic when I first saw her.  I put her on nebulizers and I think this can probably stop at this point.    Chronic rheumatoid arthritis.  Followed by Rheumatology.  On prednisone 10 mg.  There have been some instructions from her rheumatologist to taper the prednisone while she was here.  However, because of the bronchospasms when she came in, I did not attempt to do this.  If she vomited her prednisone today, I will give her another 10 mg.    History of diastolic heart failure.  She is on 5 mg of Demadex.  I can pick up none of this.  Her lab work from 09/03/2014 showed a sodium of 144, potassium of 3.5.  BUN and creatinine were normal.    Recently treated for cellulitis, with a white count of  13.6.  She still has some edema of the right lower leg, although I see no evidence of a DVT or cellulitis.  She does, however, have chronic venous insufficiency.  She will finish her doxycycline.    Overall, I would like to see this lady eat a meal before we discharge her.  She will need home health OT and PT.  A daughter is going to be staying with her.  She has a rolling walker at home.      CPT CODE: [id number] (greater than 30 minutes)

## 2014-09-18 ENCOUNTER — Emergency Department (HOSPITAL_COMMUNITY): Payer: Medicare Other

## 2014-09-18 ENCOUNTER — Encounter (HOSPITAL_COMMUNITY): Payer: Self-pay | Admitting: Emergency Medicine

## 2014-09-18 ENCOUNTER — Inpatient Hospital Stay (HOSPITAL_COMMUNITY)
Admission: EM | Admit: 2014-09-18 | Discharge: 2014-09-21 | DRG: 871 | Disposition: A | Discharge status: Home Health | Payer: Medicare Other | Attending: Internal Medicine | Admitting: Internal Medicine

## 2014-09-18 DIAGNOSIS — I5032 Chronic diastolic (congestive) heart failure: Secondary | ICD-10-CM | POA: Diagnosis not present

## 2014-09-18 DIAGNOSIS — J9601 Acute respiratory failure with hypoxia: Secondary | ICD-10-CM | POA: Diagnosis present

## 2014-09-18 DIAGNOSIS — Z6821 Body mass index (BMI) 21.0-21.9, adult: Secondary | ICD-10-CM

## 2014-09-18 DIAGNOSIS — J189 Pneumonia, unspecified organism: Secondary | ICD-10-CM | POA: Diagnosis not present

## 2014-09-18 DIAGNOSIS — R1310 Dysphagia, unspecified: Secondary | ICD-10-CM | POA: Diagnosis present

## 2014-09-18 DIAGNOSIS — R112 Nausea with vomiting, unspecified: Secondary | ICD-10-CM | POA: Diagnosis not present

## 2014-09-18 DIAGNOSIS — R0602 Shortness of breath: Secondary | ICD-10-CM

## 2014-09-18 DIAGNOSIS — Z79899 Other long term (current) drug therapy: Secondary | ICD-10-CM

## 2014-09-18 DIAGNOSIS — I9589 Other hypotension: Secondary | ICD-10-CM

## 2014-09-18 DIAGNOSIS — M1990 Unspecified osteoarthritis, unspecified site: Secondary | ICD-10-CM | POA: Diagnosis present

## 2014-09-18 DIAGNOSIS — Y95 Nosocomial condition: Secondary | ICD-10-CM | POA: Diagnosis present

## 2014-09-18 DIAGNOSIS — R0600 Dyspnea, unspecified: Secondary | ICD-10-CM | POA: Diagnosis present

## 2014-09-18 DIAGNOSIS — E43 Unspecified severe protein-calorie malnutrition: Secondary | ICD-10-CM | POA: Diagnosis present

## 2014-09-18 DIAGNOSIS — Z87891 Personal history of nicotine dependence: Secondary | ICD-10-CM | POA: Diagnosis not present

## 2014-09-18 DIAGNOSIS — A419 Sepsis, unspecified organism: Secondary | ICD-10-CM | POA: Diagnosis not present

## 2014-09-18 DIAGNOSIS — J690 Pneumonitis due to inhalation of food and vomit: Secondary | ICD-10-CM | POA: Diagnosis not present

## 2014-09-18 DIAGNOSIS — I959 Hypotension, unspecified: Secondary | ICD-10-CM | POA: Diagnosis present

## 2014-09-18 DIAGNOSIS — Z96641 Presence of right artificial hip joint: Secondary | ICD-10-CM | POA: Diagnosis present

## 2014-09-18 DIAGNOSIS — D539 Nutritional anemia, unspecified: Secondary | ICD-10-CM | POA: Diagnosis not present

## 2014-09-18 DIAGNOSIS — Z8542 Personal history of malignant neoplasm of other parts of uterus: Secondary | ICD-10-CM | POA: Diagnosis not present

## 2014-09-18 DIAGNOSIS — M25552 Pain in left hip: Secondary | ICD-10-CM

## 2014-09-18 HISTORY — DX: Anemia, unspecified: D64.9

## 2014-09-18 HISTORY — DX: Hypotension, unspecified: I95.9

## 2014-09-18 HISTORY — DX: Pneumonia, unspecified organism: J18.9

## 2014-09-18 HISTORY — DX: Muscle weakness (generalized): M62.81

## 2014-09-18 HISTORY — DX: Heart failure, unspecified: I50.9

## 2014-09-18 HISTORY — DX: Unspecified osteoarthritis, unspecified site: M19.90

## 2014-09-18 LAB — CBC WITH DIFFERENTIAL/PLATELET
Basophils Absolute: 0 10*3/uL (ref 0.0–0.1)
Basophils Relative: 0 % (ref 0–1)
Eosinophils Absolute: 0.1 10*3/uL (ref 0.0–0.7)
Eosinophils Relative: 0 % (ref 0–5)
HCT: 35.1 % — ABNORMAL LOW (ref 36.0–46.0)
HEMOGLOBIN: 11 g/dL — AB (ref 12.0–15.0)
Lymphocytes Relative: 30 % (ref 12–46)
Lymphs Abs: 3.5 10*3/uL (ref 0.7–4.0)
MCH: 31.9 pg (ref 26.0–34.0)
MCHC: 31.3 g/dL (ref 30.0–36.0)
MCV: 101.7 fL — ABNORMAL HIGH (ref 78.0–100.0)
MONOS PCT: 11 % (ref 3–12)
Monocytes Absolute: 1.3 10*3/uL — ABNORMAL HIGH (ref 0.1–1.0)
Neutro Abs: 7 10*3/uL (ref 1.7–7.7)
Neutrophils Relative %: 59 % (ref 43–77)
PLATELETS: 316 10*3/uL (ref 150–400)
RBC: 3.45 MIL/uL — ABNORMAL LOW (ref 3.87–5.11)
RDW: 14.2 % (ref 11.5–15.5)
WBC: 11.9 10*3/uL — ABNORMAL HIGH (ref 4.0–10.5)

## 2014-09-18 LAB — I-STAT CG4 LACTIC ACID, ED: Lactic Acid, Venous: 2.16 mmol/L (ref 0.5–2.0)

## 2014-09-18 LAB — COMPREHENSIVE METABOLIC PANEL
ALT: 9 U/L — ABNORMAL LOW (ref 14–54)
AST: 22 U/L (ref 15–41)
Albumin: 3.2 g/dL — ABNORMAL LOW (ref 3.5–5.0)
Alkaline Phosphatase: 59 U/L (ref 38–126)
Anion gap: 13 (ref 5–15)
BUN: 14 mg/dL (ref 6–20)
CALCIUM: 8.9 mg/dL (ref 8.9–10.3)
CO2: 24 mmol/L (ref 22–32)
CREATININE: 0.8 mg/dL (ref 0.44–1.00)
Chloride: 105 mmol/L (ref 101–111)
GFR calc Af Amer: >60 mL/min (ref >60)
GLUCOSE: 96 mg/dL (ref 70–99)
POTASSIUM: 3.2 mmol/L — AB (ref 3.5–5.1)
SODIUM: 142 mmol/L (ref 135–145)
Total Bilirubin: 0.9 mg/dL (ref 0.3–1.2)
Total Protein: 6.2 g/dL — ABNORMAL LOW (ref 6.5–8.1)

## 2014-09-18 LAB — GLUCOSE, CAPILLARY: Glucose-Capillary: 174 mg/dL — ABNORMAL HIGH (ref 70–99)

## 2014-09-18 LAB — TROPONIN I

## 2014-09-18 MED ORDER — PREDNISONE 10 MG PO TABS
10.0000 mg | ORAL_TABLET | Freq: Every day | ORAL | Status: DC
  Administered 2014-09-19 – 2014-09-21 (×3): 10 mg via ORAL
  Filled 2014-09-18 (×3): qty 1

## 2014-09-18 MED ORDER — SACCHAROMYCES BOULARDII 250 MG PO CAPS
250.0000 mg | ORAL_CAPSULE | Freq: Two times a day (BID) | ORAL | Status: DC
  Administered 2014-09-18 – 2014-09-21 (×6): 250 mg via ORAL
  Filled 2014-09-18 (×7): qty 1

## 2014-09-18 MED ORDER — SODIUM CHLORIDE 0.9 % IV SOLN
INTRAVENOUS | Status: DC
  Administered 2014-09-18 – 2014-09-21 (×4): via INTRAVENOUS

## 2014-09-18 MED ORDER — SODIUM CHLORIDE 0.9 % IV BOLUS (SEPSIS)
500.0000 mL | Freq: Once | INTRAVENOUS | Status: AC
  Administered 2014-09-18: 500 mL via INTRAVENOUS

## 2014-09-18 MED ORDER — VANCOMYCIN HCL 500 MG IV SOLR
500.0000 mg | INTRAVENOUS | Status: DC
  Administered 2014-09-19: 500 mg via INTRAVENOUS
  Filled 2014-09-18 (×2): qty 500

## 2014-09-18 MED ORDER — POTASSIUM CHLORIDE 20 MEQ PO PACK
20.0000 meq | PACK | Freq: Every day | ORAL | Status: DC
  Administered 2014-09-19 – 2014-09-21 (×3): 20 meq via ORAL
  Filled 2014-09-18 (×3): qty 1

## 2014-09-18 MED ORDER — SODIUM CHLORIDE 0.9 % IV BOLUS (SEPSIS): 500.0000 mL | Freq: Once | INTRAVENOUS | Status: AC

## 2014-09-18 MED ORDER — ONDANSETRON HCL 4 MG/2ML IJ SOLN
4.0000 mg | Freq: Four times a day (QID) | INTRAMUSCULAR | Status: DC | PRN
  Administered 2014-09-20 (×2): 4 mg via INTRAVENOUS
  Filled 2014-09-18 (×2): qty 2

## 2014-09-18 MED ORDER — HEPARIN SODIUM (PORCINE) 5000 UNIT/ML IJ SOLN
5000.0000 [IU] | Freq: Three times a day (TID) | INTRAMUSCULAR | Status: DC
  Administered 2014-09-18 – 2014-09-21 (×8): 5000 [IU] via SUBCUTANEOUS
  Filled 2014-09-18 (×9): qty 1

## 2014-09-18 MED ORDER — VANCOMYCIN HCL IN DEXTROSE 1-5 GM/200ML-% IV SOLN
1000.0000 mg | Freq: Once | INTRAVENOUS | Status: AC
  Administered 2014-09-18: 1000 mg via INTRAVENOUS
  Filled 2014-09-18: qty 200

## 2014-09-18 MED ORDER — ONDANSETRON HCL 4 MG PO TABS
4.0000 mg | ORAL_TABLET | Freq: Four times a day (QID) | ORAL | Status: DC | PRN
Start: 2014-09-18 — End: 2014-09-21

## 2014-09-18 MED ORDER — IPRATROPIUM-ALBUTEROL 0.5-2.5 (3) MG/3ML IN SOLN
3.0000 mL | Freq: Once | RESPIRATORY_TRACT | Status: AC
  Administered 2014-09-18: 3 mL via RESPIRATORY_TRACT
  Filled 2014-09-18: qty 3

## 2014-09-18 MED ORDER — ADULT MULTIVITAMIN W/MINERALS CH
1.0000 | ORAL_TABLET | Freq: Every day | ORAL | Status: DC
  Administered 2014-09-18 – 2014-09-21 (×4): 1 via ORAL
  Filled 2014-09-18 (×4): qty 1

## 2014-09-18 MED ORDER — CEFEPIME HCL 1 G IJ SOLR
1.0000 g | INTRAMUSCULAR | Status: DC
  Administered 2014-09-18 – 2014-09-19 (×2): 1 g via INTRAVENOUS
  Filled 2014-09-18 (×3): qty 1

## 2014-09-18 MED ORDER — ACETAMINOPHEN 500 MG PO TABS
500.0000 mg | ORAL_TABLET | Freq: Four times a day (QID) | ORAL | Status: DC | PRN
  Administered 2014-09-20: 500 mg via ORAL
  Filled 2014-09-18 (×2): qty 1

## 2014-09-18 MED ORDER — OXYCODONE-ACETAMINOPHEN 5-325 MG PO TABS
1.0000 | ORAL_TABLET | Freq: Four times a day (QID) | ORAL | Status: DC | PRN
  Administered 2014-09-20: 1 via ORAL
  Filled 2014-09-18: qty 1

## 2014-09-18 NOTE — H&P (Signed)
[facility] History and Physical  [name] NIO:[id number] DOB: [birth year] DOA: 09/18/2014  Referring physician: ER PCP: [name], MD   Chief Complaint: Dyspnea  HPI: [name] is a 81 y.o. female  This is a 81 year old lady who has had dyspnea since this morning. She was recently discharged from the [facility] for rehabilitation secondary to pneumonia. Apparently oxygen saturation was lower at home. She also describes vomiting today. There is no nausea or abdominal pain. Initial temperature in the emergency room was 100.5. She denies any chest pain, palpitations or limb weakness. She does have a history of congestive heart failure apparently.  Review of Systems:  Apart from symptoms above, all systems negative.  Past Medical History  Diagnosis Date  . Cancer     uterine surg only  . Arthritis   . Kidney stone   . Pancreatic atrophy 12/31/2012  . Colitis 12/31/2012    presumed C.Diff. patient declined colonoscopy  . Pneumonia   . Osteoarthritis   . Muscle weakness (generalized)   . CHF (congestive heart failure)   . Anemia   . Hypotension    Past Surgical History  Procedure Laterality Date  . Joint replacement    . Cholecystectomy    . Right hip surgery    . Left knee surgery and revision    . Esophagogastroduodenoscopy N/A 03/03/2013    RMR: Significant gastric ulcer without bleeding stigmata requiring no endoscopic intervention today. Noncritical Schatzki's ring. Small hiatal hernia  . Esophagogastroduodenoscopy N/A 08/24/2013    Dr. [name]: Hiatal hernia.  Gastric mucosal scar formation at site of previously noted gastric ulcerations   Social History:  reports that she quit smoking about 63 years ago. Her smoking use included Cigarettes. She has never used smokeless tobacco. She reports that she drinks about 0.6 oz of alcohol per week. She reports that she does not use illicit drugs.  Allergies  Allergen Reactions  . Sulfa Antibiotics   . Aspirin  Other (See Comments)    Makes stomach burn    Family History  Problem Relation Age of Onset  . Cancer Mother     Prior to Admission medications   Medication Sig Start Date End Date Taking? Authorizing Provider  acetaminophen (TYLENOL) 500 MG tablet Take 500 mg by mouth every 6 (six) hours as needed for pain.    Historical Provider, MD  levofloxacin (LEVAQUIN) 750 MG tablet Take 1 tablet (750 mg total) by mouth daily. 08/15/14   Radene Gunning, NP  Multiple Vitamin (MULTIVITAMIN WITH MINERALS) TABS tablet Take 1 tablet by mouth daily.    Historical Provider, MD  oxyCODONE-acetaminophen (PERCOCET/ROXICET) 5-325 MG per tablet Take 1/2 or 2 half tablets by mouth every 4 hours as needed for moderate or severe pain 09/04/14   [name], DO  potassium chloride (KLOR-CON) 20 MEQ packet Take 20 mEq by mouth daily. Mixed with water/orange juice    Historical Provider, MD  predniSONE (DELTASONE) 10 MG tablet Take 10 mg by mouth daily with breakfast.    Historical Provider, MD  saccharomyces boulardii (FLORASTOR) 250 MG capsule Take 250 mg by mouth 2 (two) times daily.    Historical Provider, MD  torsemide (DEMADEX) 10 MG tablet Take 5 mg by mouth daily.    Historical Provider, MD   Physical Exam: Filed Vitals:   09/18/14 1400 09/18/14 1426 09/18/14 1445 09/18/14 1555  BP: 98/55  100/53 107/58  Pulse: 102  127 124  Temp:    99.1 F (37.3  C)  TempSrc:    Oral  Resp: 15  21 26   Height:      Weight:      SpO2: 100% 99% 95% 100%    Wt Readings from Last 3 Encounters:  09/18/14 41.277 kg (91 lb)  08/15/14 41.8 kg (92 lb 2.4 oz)  07/27/14 42.185 kg (93 lb)    General:  Appears calm and comfortable. At the present time she does not appear to have increased work of breathing. Eyes: PERRL, normal lids, irises & conjunctiva ENT: grossly normal hearing, lips & tongue Neck: no LAD, masses or thyromegaly Cardiovascular: RRR, no m/r/g. No LE edema. Telemetry: SR, no arrhythmias  Respiratory:  Scattered wheezing. There are no crackles or bronchial breathing. Abdomen: soft, ntnd Skin: no rash or induration seen on limited exam Musculoskeletal: She has significant scoliosis. Psychiatric: grossly normal mood and affect, speech fluent and appropriate Neurologic: grossly non-focal.          Labs on Admission:  Basic Metabolic Panel:  Recent Labs Lab 09/18/14 1415  NA 142  K 3.2*  CL 105  CO2 24  GLUCOSE 96  BUN 14  CREATININE 0.80  CALCIUM 8.9   Liver Function Tests:  Recent Labs Lab 09/18/14 1415  AST 22  ALT 9*  ALKPHOS 59  BILITOT 0.9  PROT 6.2*  ALBUMIN 3.2*   No results for input(s): LIPASE, AMYLASE in the last 168 hours. No results for input(s): AMMONIA in the last 168 hours. CBC:  Recent Labs Lab 09/18/14 1415  WBC 11.9*  NEUTROABS 7.0  HGB 11.0*  HCT 35.1*  MCV 101.7*  PLT 316   Cardiac Enzymes:  Recent Labs Lab 09/18/14 1415  TROPONINI <0.03    BNP (last 3 results)  Recent Labs  09/03/14 0600  BNP 148.0*    ProBNP (last 3 results) No results for input(s): PROBNP in the last 8760 hours.  CBG: No results for input(s): GLUCAP in the last 168 hours.  Radiological Exams on Admission: Dg Chest Port 1 View  09/18/2014   CLINICAL DATA:  Shortness of breath and weakness.  Recent pneumonia.  EXAM: PORTABLE CHEST - 1 VIEW  COMPARISON:  08/16/2014  FINDINGS: There is a marked kyphoscoliosis deformity involving the thoracic spine. This diminishes sensitivity for detecting acute pulmonary abnormalities such as airspace consolidation. No pleural effusion or edema noted. No airspace consolidation.  IMPRESSION: 1. Diminished exam detail due to marked kyphoscoliosis deformity. 2. No acute findings noted.   Electronically Signed   By: Kerby Moors M.D.   On: 09/18/2014 14:33   Dg Hip Unilat With Pelvis 2-3 Views Left  09/18/2014   CLINICAL DATA:  Left hip and pelvis pain.  No reported injury.  EXAM: LEFT HIP (WITH PELVIS) 2-3 VIEWS  COMPARISON:   None.  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 Mild left hip degenerative changes. Old, healed left superior and inferior pubic ramus fractures and probable old, healed right pubic and ischial fractures. No acute fracture or dislocation seen. Bilateral pelvic and lower abdominal surgical clips. Lumbar spine degenerative changes and scoliosis.  IMPRESSION: 1. Mild left hip degenerative changes. 2. No acute fracture or dislocation. 3. 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 4. Lower lumbar spine degenerative changes and scoliosis.   Electronically Signed   By: Claudie Revering M.D.   On: 09/18/2014 15:16    EKG: Independently reviewed. Sinus tachycardia. There is no acute ST-T wave changes.  Assessment/Plan   1. Dyspnea. The etiology of sudden onset  dyspnea is unclear to me. She appears to be improved with bronchodilators that were given in the emergency room. Together with the vomiting she described, I wonder if she had an episode of aspiration. She will be treated with empirical intravenous antibiotics. I will obtain swallowing evaluation. We will give her gentle IV fluids. 2. Hypotension with tachycardia. She may be somewhat hypovolemic and she will be treated with gentle IV fluids.  Further recommendations will depend on patient's hospital progress.  Code Status: Full code.   DVT Prophylaxis: Heparin.  Family Communication: I discussed the plan with the patient and patient's daughter at the bedside. We also discussed CODE STATUS and they will think about this and discuss further.   Disposition Plan: Depending on progress.  Time spent: 60 minutes.  Doree Albee [facility] Pager [phone].

## 2014-09-18 NOTE — ED Notes (Signed)
Patient brought in via EMS from home. Alert and oriented. Airway patent. Patient c/o shortness of breath that started this morning at 6am. Patient recently discharged from [facility] on 4/28 for rehab from pneumonia. Per paramedic patient's O2 sat 89% on room air. Patient now 98% on duoneb. Per EMS patient has received 2 dounebs, 1 albuterol breathing treatment, 125mg  solumedrol IV, 4mg  of Zofran IV, and 1 gram of tylenol PO in route to hospital. Per EMS patient's temp 100.5 tympanic.

## 2014-09-18 NOTE — ED Notes (Signed)
Attempted cath for urine with no return.

## 2014-09-18 NOTE — ED Provider Notes (Addendum)
CSN: [id number]     Arrival date & time 09/18/14  1358 History   First MD Initiated Contact with Patient 09/18/14 1400     Chief Complaint  Patient presents with  . Shortness of Breath     (Consider location/radiation/quality/duration/timing/severity/associated sxs/prior Treatment) HPI..... Level V caveat for urgent need for intervention. Patient complains of shortness of breath since this morning. Patient recently discharged the [facility] for rehabilitation secondary to pneumonia. O2 sats were low at home. Patient received 2 DuoNeb's, IV Solu-Medrol, IV Zofran and oral Tylenol via EMS. Initial temperature was 100.5.  Past Medical History  Diagnosis Date  . Cancer     uterine surg only  . Arthritis   . Kidney stone   . Pancreatic atrophy 12/31/2012  . Colitis 12/31/2012    presumed C.Diff. patient declined colonoscopy  . Pneumonia   . Osteoarthritis   . Muscle weakness (generalized)   . CHF (congestive heart failure)   . Anemia   . Hypotension    Past Surgical History  Procedure Laterality Date  . Joint replacement    . Cholecystectomy    . Right hip surgery    . Left knee surgery and revision    . Esophagogastroduodenoscopy N/A 03/03/2013    RMR: Significant gastric ulcer without bleeding stigmata requiring no endoscopic intervention today. Noncritical Schatzki's ring. Small hiatal hernia  . Esophagogastroduodenoscopy N/A 08/24/2013    Dr. [name]: Hiatal hernia.  Gastric mucosal scar formation at site of previously noted gastric ulcerations   Family History  Problem Relation Age of Onset  . Cancer Mother    History  Substance Use Topics  . Smoking status: Former Smoker    Types: Cigarettes    Quit date: 04/25/1951  . Smokeless tobacco: Never Used  . Alcohol Use: 0.6 oz/week    1 Glasses of wine per week     Comment: 1 glass of wine a day- former   OB History    Gravida Para Term Preterm AB TAB SAB Ectopic Multiple Living   3 2 2  1  1         Review of Systems   Unable to perform ROS: Acuity of condition      Allergies  Sulfa antibiotics and Aspirin  Home Medications   Prior to Admission medications   Medication Sig Start Date End Date Taking? Authorizing Provider  acetaminophen (TYLENOL) 500 MG tablet Take 500 mg by mouth every 6 (six) hours as needed for pain.    Historical Provider, MD  levofloxacin (LEVAQUIN) 750 MG tablet Take 1 tablet (750 mg total) by mouth daily. 08/15/14   Radene Gunning, NP  Multiple Vitamin (MULTIVITAMIN WITH MINERALS) TABS tablet Take 1 tablet by mouth daily.    Historical Provider, MD  oxyCODONE-acetaminophen (PERCOCET/ROXICET) 5-325 MG per tablet Take 1/2 or 2 half tablets by mouth every 4 hours as needed for moderate or severe pain 09/04/14   [name], DO  potassium chloride (KLOR-CON) 20 MEQ packet Take 20 mEq by mouth daily. Mixed with water/orange juice    Historical Provider, MD  predniSONE (DELTASONE) 10 MG tablet Take 10 mg by mouth daily with breakfast.    Historical Provider, MD  saccharomyces boulardii (FLORASTOR) 250 MG capsule Take 250 mg by mouth 2 (two) times daily.    Historical Provider, MD  torsemide (DEMADEX) 10 MG tablet Take 5 mg by mouth daily.    Historical Provider, MD   BP 107/58 mmHg  Pulse 124  Temp(Src) 99.1 F (37.3  C) (Oral)  Resp 26  Ht 4\' 6"  (1.372 m)  Wt 91 lb (41.277 kg)  BMI 21.93 kg/m2  SpO2 100% Physical Exam  Constitutional: She is oriented to person, place, and time.  Very thin, cachectic, dyspneic  HENT:  Head: Normocephalic and atraumatic.  Eyes: Conjunctivae and EOM are normal. Pupils are equal, round, and reactive to light.  Neck: Normal range of motion. Neck supple.  Cardiovascular: Normal rate and regular rhythm.   Pulmonary/Chest:  Using accessory muscles to breathe, tachypnea, dyspnea  Abdominal: Soft. Bowel sounds are normal.  Musculoskeletal: Normal range of motion.  Neurological: She is alert and oriented to person, place, and time.  Skin: Skin is  warm and dry.  Psychiatric: She has a normal mood and affect. Her behavior is normal.  Nursing note and vitals reviewed.   ED Course  Procedures (including critical care time) Labs Review Labs Reviewed  COMPREHENSIVE METABOLIC PANEL - Abnormal; Notable for the following:    Potassium 3.2 (*)    Total Protein 6.2 (*)    Albumin 3.2 (*)    ALT 9 (*)    All other components within normal limits  CBC WITH DIFFERENTIAL/PLATELET - Abnormal; Notable for the following:    WBC 11.9 (*)    RBC 3.45 (*)    Hemoglobin 11.0 (*)    HCT 35.1 (*)    MCV 101.7 (*)    Monocytes Absolute 1.3 (*)    All other components within normal limits  I-STAT CG4 LACTIC ACID, ED - Abnormal; Notable for the following:    Lactic Acid, Venous 2.16 (*)    All other components within normal limits  CULTURE, BLOOD (ROUTINE X 2)  CULTURE, BLOOD (ROUTINE X 2)  URINE CULTURE  TROPONIN I  URINALYSIS, ROUTINE W REFLEX MICROSCOPIC    Imaging Review Dg Chest Port 1 View  09/18/2014   CLINICAL DATA:  Shortness of breath and weakness.  Recent pneumonia.  EXAM: PORTABLE CHEST - 1 VIEW  COMPARISON:  08/16/2014  FINDINGS: There is a marked kyphoscoliosis deformity involving the thoracic spine. This diminishes sensitivity for detecting acute pulmonary abnormalities such as airspace consolidation. No pleural effusion or edema noted. No airspace consolidation.  IMPRESSION: 1. Diminished exam detail due to marked kyphoscoliosis deformity. 2. No acute findings noted.   Electronically Signed   By: Kerby Moors M.D.   On: 09/18/2014 14:33   Dg Hip Unilat With Pelvis 2-3 Views Left  09/18/2014   CLINICAL DATA:  Left hip and pelvis pain.  No reported injury.  EXAM: LEFT HIP (WITH PELVIS) 2-3 VIEWS  COMPARISON:  None.  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 Mild left hip degenerative changes. Old, healed left superior and inferior pubic ramus fractures and probable old, healed right pubic and ischial fractures.  No acute fracture or dislocation seen. Bilateral pelvic and lower abdominal surgical clips. Lumbar spine degenerative changes and scoliosis.  IMPRESSION: 1. Mild left hip degenerative changes. 2. No acute fracture or dislocation. 3. 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 4. Lower lumbar spine degenerative changes and scoliosis.   Electronically Signed   By: Claudie Revering M.D.   On: 09/18/2014 15:16     EKG Interpretation   Date/Time:  Monday Sep 18 2014 14:05:37 EDT Ventricular Rate:  101 PR Interval:  161 QRS Duration: 83 QT Interval:  349 QTC Calculation: 452 R Axis:   -69 Text Interpretation:  Sinus tachycardia Left anterior fascicular block  Abnormal R-wave progression, late transition Minimal  ST depression,  lateral leads Confirmed by Sylas Twombly  MD, Sallie Maker ([id number]) on 09/18/2014 2:44:45 PM     CRITICAL CARE Performed by: Nat Christen Total critical care time: 30 Critical care time was exclusive of separately billable procedures and treating other patients. Critical care was necessary to treat or prevent imminent or life-threatening deterioration. Critical care was time spent personally by me on the following activities: development of treatment plan with patient and/or surrogate as well as nursing, discussions with consultants, evaluation of patient's response to treatment, examination of patient, obtaining history from patient or surrogate, ordering and performing treatments and interventions, ordering and review of laboratory studies, ordering and review of radiographic studies, pulse oximetry and re-evaluation of patient's condition. MDM   Final diagnoses:  Dyspnea  Left hip pain   Patient is extremely dyspneic on presentation to the emergency department. I discussed intubation status with the patient. Repeat nebulizer treatment ordered. History suggestive of pneumonia and early sepsis. Initiate IV fluids and antibiotics. Admit to general medicine.     Nat Christen,  MD 09/18/14 [place], MD 09/18/14 [phone]

## 2014-09-18 NOTE — Progress Notes (Signed)
ANTIBIOTIC CONSULT NOTE - INITIAL  Pharmacy Consult for Vancomycin & Cefepime Indication: pneumonia  Allergies  Allergen Reactions  . Sulfa Antibiotics   . Aspirin Other (See Comments)    Makes stomach burn    Patient Measurements: Height: 4\' 6"  (137.2 cm) Weight: 91 lb (41.277 kg) IBW/kg (Calculated) : 31.7 Adjusted Body Weight:   Vital Signs: Temp: 99.1 F (37.3 C) (05/09 1345) Temp Source: Oral (05/09 1345) BP: 100/53 mmHg (05/09 1445) Pulse Rate: 127 (05/09 1445) Intake/Output from previous day:   Intake/Output from this shift:    Labs:  Recent Labs  09/18/14 1415  WBC 11.9*  HGB 11.0*  PLT 316   Estimated Creatinine Clearance: 24.1 mL/min (by C-G formula based on Cr of 0.68). No results for input(s): VANCOTROUGH, VANCOPEAK, VANCORANDOM, GENTTROUGH, GENTPEAK, GENTRANDOM, TOBRATROUGH, TOBRAPEAK, TOBRARND, AMIKACINPEAK, AMIKACINTROU, AMIKACIN in the last 72 hours.   Microbiology: Recent Results (from the past 720 hour(s))  Culture, blood (routine x 2)     Status: None (Preliminary result)   Collection Time: 09/18/14  2:22 PM  Result Value Ref Range Status   Specimen Description BLOOD RIGHT ANTECUBITAL  Final   Special Requests   Final    BOTTLES DRAWN AEROBIC AND ANAEROBIC AEB=9CC ANA=12CC   Culture PENDING  Incomplete   Report Status PENDING  Incomplete    Medical History: Past Medical History  Diagnosis Date  . Cancer     uterine surg only  . Arthritis   . Kidney stone   . Pancreatic atrophy 12/31/2012  . Colitis 12/31/2012    presumed C.Diff. patient declined colonoscopy  . Pneumonia   . Osteoarthritis   . Muscle weakness (generalized)   . CHF (congestive heart failure)   . Anemia   . Hypotension     Medications:  Scheduled:    Assessment: Via EMS from home. Recently discharged from [facility] on 4/28 for rehab from pneumonia. Reduced renal function.  Goal of Therapy:  Vancomycin trough level 15-20 mcg/ml  Plan:  Vancomycin 1 GM IV  x 1 dose, then Vancomycin 500 mg IV every 24 hours Cefepime 1 GM IV every 24 hours Vancomycin trough at steady state Monitor renal function Labs per protocol.   [name], [name] 09/18/2014,2:57 PM

## 2014-09-19 DIAGNOSIS — I5032 Chronic diastolic (congestive) heart failure: Secondary | ICD-10-CM

## 2014-09-19 DIAGNOSIS — J9601 Acute respiratory failure with hypoxia: Secondary | ICD-10-CM

## 2014-09-19 DIAGNOSIS — J189 Pneumonia, unspecified organism: Secondary | ICD-10-CM

## 2014-09-19 DIAGNOSIS — A419 Sepsis, unspecified organism: Principal | ICD-10-CM

## 2014-09-19 LAB — URINALYSIS, ROUTINE W REFLEX MICROSCOPIC
Bilirubin Urine: NEGATIVE
Glucose, UA: NEGATIVE mg/dL
Ketones, ur: NEGATIVE mg/dL
Leukocytes, UA: NEGATIVE
Nitrite: NEGATIVE
Protein, ur: NEGATIVE mg/dL
SPECIFIC GRAVITY, URINE: 1.01 (ref 1.005–1.030)
UROBILINOGEN UA: 0.2 mg/dL (ref 0.0–1.0)
pH: 6 (ref 5.0–8.0)

## 2014-09-19 LAB — CBC
HCT: 29.8 % — ABNORMAL LOW (ref 36.0–46.0)
Hemoglobin: 9.5 g/dL — ABNORMAL LOW (ref 12.0–15.0)
MCH: 31.8 pg (ref 26.0–34.0)
MCHC: 31.9 g/dL (ref 30.0–36.0)
MCV: 99.7 fL (ref 78.0–100.0)
PLATELETS: 293 10*3/uL (ref 150–400)
RBC: 2.99 MIL/uL — AB (ref 3.87–5.11)
RDW: 13.9 % (ref 11.5–15.5)
WBC: 13.8 10*3/uL — AB (ref 4.0–10.5)

## 2014-09-19 LAB — INFLUENZA PANEL BY PCR (TYPE A & B)
H1N1FLUPCR: NOT DETECTED
Influenza A By PCR: NEGATIVE
Influenza B By PCR: NEGATIVE

## 2014-09-19 LAB — URINE MICROSCOPIC-ADD ON

## 2014-09-19 LAB — COMPREHENSIVE METABOLIC PANEL
ALBUMIN: 2.8 g/dL — AB (ref 3.5–5.0)
ALK PHOS: 50 U/L (ref 38–126)
ALT: 8 U/L — AB (ref 14–54)
AST: 16 U/L (ref 15–41)
Anion gap: 7 (ref 5–15)
BUN: 16 mg/dL (ref 6–20)
CHLORIDE: 108 mmol/L (ref 101–111)
CO2: 28 mmol/L (ref 22–32)
Calcium: 8.4 mg/dL — ABNORMAL LOW (ref 8.9–10.3)
Creatinine, Ser: 0.64 mg/dL (ref 0.44–1.00)
GFR calc Af Amer: >60 mL/min (ref >60)
GFR calc non Af Amer: >60 mL/min (ref >60)
Glucose, Bld: 137 mg/dL — ABNORMAL HIGH (ref 70–99)
POTASSIUM: 3.8 mmol/L (ref 3.5–5.1)
SODIUM: 143 mmol/L (ref 135–145)
TOTAL PROTEIN: 5.5 g/dL — AB (ref 6.5–8.1)
Total Bilirubin: 0.6 mg/dL (ref 0.3–1.2)

## 2014-09-19 LAB — STREP PNEUMONIAE URINARY ANTIGEN: STREP PNEUMO URINARY ANTIGEN: NEGATIVE

## 2014-09-19 NOTE — Progress Notes (Signed)
Initial Nutrition Assessment    INTERVENTION:  Boost Breeze BID for now. When diet is advanced change oral supplement: Pt prefers Ensure (Vanilla).   NUTRITION DIAGNOSIS:  Inadequate oral intake related to pneumonia as evidenced by pt diet hx   GOAL:  Patient will meet greater than or equal to 90% of their needs   MONITOR:  PO intake, Supplement acceptance, Diet advancement  REASON FOR ASSESSMENT:   Consult Diet education  ASSESSMENT: [name] is a very pleasant 81 yo female with hx of CHF. She is consuming 75% of her clear liquids.   Weight hx: usual body weight range 88-94# for > 1 year per pt.  Diet hx: Ensure (vanilla) daily at home. She denies changes in meal pattern but lately has not been able to eat as well due to shortness of breath and decreased appetite. She indicates her weight fluctuates as noted above.  Physical exam: WDL  Height:  Ht Readings from Last 1 Encounters:  09/18/14 4\' 6"  (1.372 m)    Weight:  Wt Readings from Last 1 Encounters:  09/18/14 91 lb (41.277 kg)    Ideal Body Weight:  41 kg  Wt Readings from Last 10 Encounters:  09/18/14 91 lb (41.277 kg)  08/15/14 92 lb 2.4 oz (41.8 kg)  07/27/14 93 lb (42.185 kg)  10/05/13 100 lb (45.36 kg)  08/08/13 93 lb (42.185 kg)  06/03/13 99 lb 12.8 oz (45.269 kg)  03/02/13 93 lb 9.6 oz (42.457 kg)  02/23/13 97 lb (43.999 kg)  01/07/13 128 lb 12 oz (58.4 kg)  04/25/11 104 lb 4.8 oz (47.31 kg)    BMI:  Body mass index is 21.93 kg/(m^2). normal range  Estimated Nutritional Needs:  Kcal:   1200-1400 (to prevent weight loss)  Protein:   50-60 gr  Fluid:   1.2-1.4 liters daily  Skin:    dry, abrasion to left lower leg  Diet Order:  Diet clear liquid Room service appropriate?: Yes; Fluid consistency:: Thin  EDUCATION NEEDS:  Education needs addressed related to general healthy diet including regular meal intake   Intake/Output Summary (Last 24 hours) at 09/19/14 1507 Last data  filed at 09/19/14 1241  Gross per 24 hour  Intake   2050 ml  Output      0 ml  Net   2050 ml    Last BM:  09/15/14   Colman Cater Martha,RD,CSG,LDN Office: [phone] Pager: [phone]

## 2014-09-19 NOTE — Evaluation (Signed)
Clinical/Bedside Swallow Evaluation Patient Details  Name: [name] MRN: [id number] Date of Birth: 1919/02/04  Today's Date: 09/19/2014 Time: SLP Start Time (ACUTE ONLY): 2951 SLP Stop Time (ACUTE ONLY): 1329 SLP Time Calculation (min) (ACUTE ONLY): 27 min  Past Medical History:  Past Medical History  Diagnosis Date  . Cancer     uterine surg only  . Arthritis   . Kidney stone   . Pancreatic atrophy 12/31/2012  . Colitis 12/31/2012    presumed C.Diff. patient declined colonoscopy  . Pneumonia   . Osteoarthritis   . Muscle weakness (generalized)   . CHF (congestive heart failure)   . Anemia   . Hypotension    Past Surgical History:  Past Surgical History  Procedure Laterality Date  . Joint replacement    . Cholecystectomy    . Right hip surgery    . Left knee surgery and revision    . Esophagogastroduodenoscopy N/A 03/03/2013    RMR: Significant gastric ulcer without bleeding stigmata requiring no endoscopic intervention today. Noncritical Schatzki's ring. Small hiatal hernia  . Esophagogastroduodenoscopy N/A 08/24/2013    Dr. [name]: Hiatal hernia.  Gastric mucosal scar formation at site of previously noted gastric ulcerations   HPI:  81 y.o. female with past medical history of chronic diastolic CHF (last 2-D echo in 2014 with ejection fraction of 55% and grade 1 diastolic dysfunction), just recently discharged from [place] home 09/10/2014. Pt presented to [facility] with shortness of breath and hypoxia at home. On admission, patient was found to be febrile with T max 100.59F. blood work was notable for mild leukocytosis of 11.9, hemoglobin 11 and potassium of 3.2. Chest x-ray did not show acute cardiopulmonary findings. Because of recent residence in a nursing home she was started on broad-spectrum antibiotics, vancomycin and cefepime for healthcare associated pneumonia. SLP asked to evaluate swallow due to advanced age in setting of possible recurrent PNA.     Assessment / Plan / Recommendation Clinical Impression  Pt shows no overt signs/symptoms of aspiration with consistencies and textures presented. She does endorse difficulty swallowing large pills and chewing dry meats. Her daughter states that her mom has some trouble with carbonated beverages. Vocal quality remains clear post po trials. Recommend upgrading to D3/mech soft with thin liquids for ease of masticating meats. SLP will follow for diet tolerance.    Aspiration Risk  Mild    Diet Recommendation Dysphagia 3 (Mech soft);Thin   Medication Administration: Whole meds with liquid (break or crush large pills as able in puree) Compensations: Multiple dry swallows after each bite/sip    Other  Recommendations Oral Care Recommendations: Oral care BID Other Recommendations: Clarify dietary restrictions   Follow Up Recommendations       Frequency and Duration    1 week   Pertinent Vitals/Pain VSS    SLP Swallow Goals   Pt will demonstrate safe and efficient consumption of least restrictive diet with use of strategies as needed.   Swallow Study Prior Functional Status   Lives at home; recent stay at [facility] Date of Onset: 09/18/14 Other Pertinent Information: 81 y.o. female with past medical history of chronic diastolic CHF (last 2-D echo in 2014 with ejection fraction of 55% and grade 1 diastolic dysfunction), just recently discharged from [facility] home 09/10/2014. Pt presented to [facility] with shortness of breath and hypoxia at home. On admission, patient was found to be febrile with T max 100.59F. blood work was notable  for mild leukocytosis of 11.9, hemoglobin 11 and potassium of 3.2. Chest x-ray did not show acute cardiopulmonary findings. Because of recent residence in a nursing home she was started on broad-spectrum antibiotics, vancomycin and cefepime for healthcare associated pneumonia. SLP asked to evaluate swallow due to advanced age in setting of possible  recurrent PNA.  Type of Study: Bedside swallow evaluation Diet Prior to this Study: Thin liquids (clear liquids) Temperature Spikes Noted: No Respiratory Status: Room air History of Recent Intubation: No Behavior/Cognition: Alert;Cooperative;Pleasant mood Oral Cavity - Dentition: Adequate natural dentition/normal for age Self-Feeding Abilities: Able to feed self Patient Positioning: Upright in bed Baseline Vocal Quality: Normal Volitional Cough: Strong Volitional Swallow: Able to elicit    Oral/Motor/Sensory Function Overall Oral Motor/Sensory Function: Appears within functional limits for tasks assessed Labial ROM: Within Functional Limits Labial Symmetry: Within Functional Limits Labial Strength: Within Functional Limits Labial Sensation: Within Functional Limits Lingual ROM: Within Functional Limits Lingual Symmetry: Within Functional Limits Lingual Strength: Within Functional Limits Lingual Sensation: Within Functional Limits Facial ROM: Within Functional Limits Facial Symmetry: Within Functional Limits Facial Strength: Within Functional Limits Facial Sensation: Within Functional Limits Velum: Within Functional Limits Mandible: Within Functional Limits   Ice Chips Ice chips: Within functional limits Presentation: Spoon   Thin Liquid Thin Liquid: Within functional limits Presentation: Cup;Straw;Self Fed    Nectar Thick Nectar Thick Liquid: Not tested   Honey Thick Honey Thick Liquid: Not tested   Puree Puree: Within functional limits Presentation: Spoon;Self Fed   Solid   Thank you,  Genene Churn, CCC-SLP [phone]     Solid: Within functional limits Presentation: Self Fed       Charmin Aguiniga 09/19/2014,5:26 PM

## 2014-09-19 NOTE — Progress Notes (Signed)
Patient ID: [name], female   DOB: 05/08/1919, 81 y.o.   MRN: [id number] [facility] PROGRESS NOTE  MARIE BOROWSKI HQP:[id number] DOB: [birth year] DOA: 10/18/14 PCP: [name], MD  Brief narrative:    81 y.o. female with past medical history of chronic diastolic CHF (last 2-D echo in 2014 with ejection fraction of 55% and grade 1 diastolic dysfunction), just recently discharged from [facility] home 09/10/2014.   Pt presented to [facility] with shortness of breath and hypoxia at home. On admission, patient was found to be febrile with T max 100.58F. blood work was notable for mild leukocytosis of 11.9, hemoglobin 11 and potassium of 3.2. Chest x-ray did not show acute cardiopulmonary findings. Because of recent residence in a nursing home she was started on broad-spectrum antibiotics, vancomycin and cefepime for healthcare associated pneumonia.  Barriers to discharge: Patient is receiving IV antibiotics for healthcare associated pneumonia, vancomycin and cefepime. She needs physical therapy evaluation for safe discharge plan. If she continues to improve I anticipate discharge 09/21/2014.  Assessment/Plan:    Principal problem: Acute respiratory failure with hypoxia / healthcare associated pneumonia - Patient was mildly hypoxic on the admission. Her oxygen saturation was 92% with nasal cannula oxygen support. Hypoxia thought to be secondary to healthcare associated pneumonia considering recent residence in a nursing home. - Patient was started on broad-spectrum antibiotics, vancomycin and cefepime. I placed pneumonia order set. Please follow up strep pneumonia, influenza, respiratory culture, HIV and legionella results. - Blood cultures so far negative. - Respiratory status is stable.  Active Problems: Sepsis secondary to pneumonia / leukocytosis - Sepsis criteria met on the admission with vitals that included fever, tachycardia, tachypnea, initial hypotension. Patient also had  elevated white blood cell count, elevated lactic acid. - Patient is on vancomycin and cefepime. - Follow up final blood culture results.  Chronic diastolic CHF (congestive heart failure) - Last 2-D echo in 2014 with ejection fraction of 55% and grade 1 diastolic dysfunction. - Troponin level on the admission is within normal limits. - Demadex on hold because of hypotension.  Severe protein calorie malnutrition / underweight - Body mass index is 21.93 kg/(m^2). - Nutrition consulted - Patient on low-dose prednisone for appetite stimulation   DVT Prophylaxis  - Heparin subcutaneous ordered while patient in hospital   Code Status: Full.  Family Communication:  plan of care discussed with the patient Disposition Plan: Needs physical therapy evaluation. If she continues to improve I anticipated discharge by 09/21/2014.  IV access:  Peripheral IV  Procedures and diagnostic studies:    Dg Chest Port 1 View 10/18/2014  1. Diminished exam detail due to marked kyphoscoliosis deformity. 2. No acute findings noted.     Dg Hip Unilat With Pelvis 2-3 Views Left 10-18-2014  1. Mild left hip degenerative changes. 2. No acute fracture or dislocation. 3. 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 4. Lower lumbar spine degenerative changes and scoliosis.      Medical Consultants:  None  Other Consultants:  Physical therapy Nutrition  IAnti-Infectives:   Vancomycin 10/18/2014 --> Cefepime 2014-10-18 -->   Makaylee Spielberg, MD  [facility] Pager [phone]  Time spent in minutes: 25 minutes  If 7PM-7AM, please contact night-coverage [email or web address] Password [facility] 09/19/2014, 9:51 AM   LOS: 1 day    HPI/Subjective: No acute overnight events. Patient reports feeling short of breath with exertion.  Objective: Filed Vitals:   2014/10/18 2231 10/18/2014 2339 09/19/14 0524 09/19/14 0717  BP: 100/68  101/58  Pulse: 77  88   Temp: 98.5 F (36.9 C)  98.4 F (36.9 C)   TempSrc:  Oral  Oral   Resp: 16  16   Height:      Weight:      SpO2: 95% 96% 95% 96%    Intake/Output Summary (Last 24 hours) at 09/19/14 0951 Last data filed at 09/19/14 0907  Gross per 24 hour  Intake   1500 ml  Output      0 ml  Net   1500 ml    Exam:   General:  Pt is alert, follows commands appropriately, not in acute distress  Cardiovascular: Regular rate and rhythm, S1/S2 appreciated   Respiratory: diminished bilaterally, no wheezing, no crackles, no rhonchi  Abdomen: Soft, non tender, non distended, bowel sounds present  Extremities: No cyanosis, pulses palpable bilaterally  Neuro: Grossly nonfocal  Data Reviewed: Basic Metabolic Panel:  Recent Labs Lab 09/18/14 1415 09/19/14 0610  NA 142 143  K 3.2* 3.8  CL 105 108  CO2 24 28  GLUCOSE 96 137*  BUN 14 16  CREATININE 0.80 0.64  CALCIUM 8.9 8.4*   Liver Function Tests:  Recent Labs Lab 09/18/14 1415 09/19/14 0610  AST 22 16  ALT 9* 8*  ALKPHOS 59 50  BILITOT 0.9 0.6  PROT 6.2* 5.5*  ALBUMIN 3.2* 2.8*   No results for input(s): LIPASE, AMYLASE in the last 168 hours. No results for input(s): AMMONIA in the last 168 hours. CBC:  Recent Labs Lab 09/18/14 1415 09/19/14 0610  WBC 11.9* 13.8*  NEUTROABS 7.0  --   HGB 11.0* 9.5*  HCT 35.1* 29.8*  MCV 101.7* 99.7  PLT 316 293   Cardiac Enzymes:  Recent Labs Lab 09/18/14 1415  TROPONINI <0.03   BNP: Invalid input(s): POCBNP CBG:  Recent Labs Lab 09/18/14 1717  GLUCAP 174*    Recent Results (from the past 240 hour(s))  Culture, blood (routine x 2)     Status: None (Preliminary result)   Collection Time: 09/18/14  2:15 PM  Result Value Ref Range Status   Specimen Description BLOOD LEFT ANTECUBITAL DRAWN BY RN  Final   Special Requests BOTTLES DRAWN AEROBIC AND ANAEROBIC 6CC  Final   Culture NO GROWTH <24 HRS  Final   Report Status PENDING  Incomplete  Culture, blood (routine x 2)     Status: None (Preliminary result)   Collection  Time: 09/18/14  2:22 PM  Result Value Ref Range Status   Specimen Description BLOOD RIGHT ANTECUBITAL  Final   Special Requests   Final    BOTTLES DRAWN AEROBIC AND ANAEROBIC AEB=9CC ANA=12CC   Culture NO GROWTH <24 HRS  Final   Report Status PENDING  Incomplete     Scheduled Meds: . ceFEPime (MAXIPIME)   1 g Intravenous Q24H  . heparin  5,000 Units Subcutaneous 3 times per day  . multivitamin with minerals  1 tablet Oral Daily  . potassium chloride  20 mEq Oral Daily  . predniSONE  10 mg Oral Q breakfast  . saccharomyces boulardii  250 mg Oral BID  . vancomycin  500 mg Intravenous Q24H   Continuous Infusions: . sodium chloride 75 mL/hr at 09/18/14 1819

## 2014-09-19 NOTE — Care Management Note (Signed)
Case Management Note  Patient Details  Name: [name] MRN: [id number] Date of Birth: [birth year]   Expected Discharge Date:  09/21/14               Expected Discharge Plan:  [place]  In-House Referral:  NA  Discharge planning Services  CM Consult  Post Acute Care Choice:  Resumption of Svcs/PTA Provider Choice offered to:     DME Arranged:    DME Agency:     HH Arranged:    HH Agency:     Status of Service:  In process, will continue to follow  Medicare Important Message Given:    Date Medicare IM Given:    Medicare IM give by:    Date Additional Medicare IM Given:    Additional Medicare Important Message give by:     If discussed at [place] of Stay Meetings, dates discussed:    Additional Comments: Pt is from home, lives alone with hired help for housework. Pt has HH PT/OT through [facility]. Pt recently DC's from [facility]. Pt has cane and walker for PRN use. Pt has no home O2 or respiratory equipment. Pt plans to discharge home with resumption of [facility] services. [facility] aware of admission, at DC pt will need order to resume [facility] services.   Sherald Barge, RN 09/19/2014, 1:57 PM

## 2014-09-20 DIAGNOSIS — D539 Nutritional anemia, unspecified: Secondary | ICD-10-CM

## 2014-09-20 LAB — URINE CULTURE
COLONY COUNT: NO GROWTH
CULTURE: NO GROWTH

## 2014-09-20 LAB — LEGIONELLA ANTIGEN, URINE

## 2014-09-20 LAB — HIV ANTIBODY (ROUTINE TESTING W REFLEX): HIV SCREEN 4TH GENERATION: NONREACTIVE

## 2014-09-20 MED ORDER — BISACODYL 5 MG PO TBEC
10.0000 mg | DELAYED_RELEASE_TABLET | Freq: Once | ORAL | Status: DC
  Filled 2014-09-20: qty 2

## 2014-09-20 MED ORDER — AMOXICILLIN-POT CLAVULANATE 875-125 MG PO TABS
1.0000 | ORAL_TABLET | Freq: Two times a day (BID) | ORAL | Status: DC
  Administered 2014-09-20 – 2014-09-21 (×3): 1 via ORAL
  Filled 2014-09-20 (×3): qty 1

## 2014-09-20 MED ORDER — SIMETHICONE 80 MG PO CHEW
160.0000 mg | CHEWABLE_TABLET | Freq: Four times a day (QID) | ORAL | Status: DC | PRN
  Administered 2014-09-20: 160 mg via ORAL
  Filled 2014-09-20: qty 2

## 2014-09-20 NOTE — Progress Notes (Signed)
[facility] PROGRESS NOTE  [name] JSH:[id number] DOB: 1918-12-13 DOA: 09/18/2014 PCP: [name], MD  Assessment/Plan: 1. Sepsis with HCAP vs aspiration PNA 1. Presenting septic picture with tachycardia, leukocytosis, tachypnea, hypotension with elevated lactic acid 2. Leukocytosis has increased overnigh, thus very concerning for worsening sepsis 3. Will focus abx tx to cover aspiration PNA and transition abx to augmentin 4. Presently afebrile 2. Chronic diastolic CHF 1. Stable 2. Euvolemic 3. Severe protein calorie malnutrition 1. Nutrition was consulted 4. DVT prophylaxis 1. Heparin subQ 5. Dysphagia 1. Evaluated by SLP 2. Pt recommended to continue a dysphagia 3 diet with thin liquids  Code Status: Full Family Communication: Pt in room, family at bedside (indicate person spoken with, relationship, and if by phone, the number) Disposition Plan: Possible d/c in 24hrs  Consultants:    Procedures:    Antibiotics:  Vanc 5/9>>>5/11  Cefepime 5/9>>>5/11  Augmentin 5/11>>>  HPI/Subjective: States feeling better today. No complaints. Not coughing up mucus  Objective: Filed Vitals:   09/19/14 1423 09/19/14 2220 09/20/14 0547 09/20/14 1421  BP: 92/46 94/52 100/58 95/33  Pulse: 60 84 74 73  Temp: 98.8 F (37.1 C) 98.3 F (36.8 C) 98.4 F (36.9 C) 97.9 F (36.6 C)  TempSrc: Oral Oral Oral Oral  Resp: 17 16 16 18   Height:      Weight:      SpO2: 96% 95% 95% 97%    Intake/Output Summary (Last 24 hours) at 09/20/14 1422 Last data filed at 09/19/14 1800  Gross per 24 hour  Intake   1690 ml  Output      0 ml  Net   1690 ml   Filed Weights   09/18/14 1345  Weight: 41.277 kg (91 lb)    Exam:   General:  Awake, in nad, sitting in chair  Cardiovascular: regular, s1, s2  Respiratory: normal resp effort, no wheezing  Abdomen: soft,nondistended  Musculoskeletal: perfused,no clubbing   Data Reviewed: Basic Metabolic Panel:  Recent  Labs Lab 09/18/14 1415 09/19/14 0610  NA 142 143  K 3.2* 3.8  CL 105 108  CO2 24 28  GLUCOSE 96 137*  BUN 14 16  CREATININE 0.80 0.64  CALCIUM 8.9 8.4*   Liver Function Tests:  Recent Labs Lab 09/18/14 1415 09/19/14 0610  AST 22 16  ALT 9* 8*  ALKPHOS 59 50  BILITOT 0.9 0.6  PROT 6.2* 5.5*  ALBUMIN 3.2* 2.8*   No results for input(s): LIPASE, AMYLASE in the last 168 hours. No results for input(s): AMMONIA in the last 168 hours. CBC:  Recent Labs Lab 09/18/14 1415 09/19/14 0610  WBC 11.9* 13.8*  NEUTROABS 7.0  --   HGB 11.0* 9.5*  HCT 35.1* 29.8*  MCV 101.7* 99.7  PLT 316 293   Cardiac Enzymes:  Recent Labs Lab 09/18/14 1415  TROPONINI <0.03   BNP (last 3 results)  Recent Labs  09/03/14 0600  BNP 148.0*    ProBNP (last 3 results) No results for input(s): PROBNP in the last 8760 hours.  CBG:  Recent Labs Lab 09/18/14 1717  GLUCAP 174*    Recent Results (from the past 240 hour(s))  Culture, blood (routine x 2)     Status: None (Preliminary result)   Collection Time: 09/18/14  2:15 PM  Result Value Ref Range Status   Specimen Description BLOOD LEFT ANTECUBITAL DRAWN BY RN  Final   Special Requests BOTTLES DRAWN AEROBIC AND ANAEROBIC 6CC  Final   Culture NO GROWTH 2 DAYS  Final   Report Status PENDING  Incomplete  Culture, blood (routine x 2)     Status: None (Preliminary result)   Collection Time: 09/18/14  2:22 PM  Result Value Ref Range Status   Specimen Description BLOOD RIGHT ANTECUBITAL  Final   Special Requests   Final    BOTTLES DRAWN AEROBIC AND ANAEROBIC AEB=9CC ANA=12CC   Culture NO GROWTH 2 DAYS  Final   Report Status PENDING  Incomplete     Studies: Dg Hip Unilat With Pelvis 2-3 Views Left  09/18/2014   CLINICAL DATA:  Left hip and pelvis pain.  No reported injury.  EXAM: LEFT HIP (WITH PELVIS) 2-3 VIEWS  COMPARISON:  None.  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 Mild left hip degenerative changes.  Old, healed left superior and inferior pubic ramus fractures and probable old, healed right pubic and ischial fractures. No acute fracture or dislocation seen. Bilateral pelvic and lower abdominal surgical clips. Lumbar spine degenerative changes and scoliosis.  IMPRESSION: 1. Mild left hip degenerative changes. 2. No acute fracture or dislocation. 3. 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 4. Lower lumbar spine degenerative changes and scoliosis.   Electronically Signed   By: Claudie Revering M.D.   On: 09/18/2014 15:16    Scheduled Meds: . amoxicillin-clavulanate  1 tablet Oral Q12H  . heparin  5,000 Units Subcutaneous 3 times per day  . multivitamin with minerals  1 tablet Oral Daily  . potassium chloride  20 mEq Oral Daily  . predniSONE  10 mg Oral Q breakfast  . saccharomyces boulardii  250 mg Oral BID  . sodium chloride  500 mL Intravenous Once   Continuous Infusions: . sodium chloride 75 mL/hr at 09/19/14 1420    Active Problems:   Hypotension   Macrocytic anemia   Chronic diastolic CHF (congestive heart failure)   Dyspnea   Nausea and vomiting   Kennth Vanbenschoten, [facility] Pager [phone]. If 7PM-7AM, please contact night-coverage at [email or web address], password [facility] 09/20/2014, 2:22 PM  LOS: 2 days

## 2014-09-20 NOTE — Evaluation (Signed)
Physical Therapy Evaluation Patient Details Name: [name] MRN: [id number] DOB: 11-Jun-1918 Today's Date: 09/20/2014   History of Present Illness  This is a 81 year old lady who has had dyspnea since this morning. She was recently discharged from the [facility] for rehabilitation secondary to pneumonia. Apparently oxygen saturation was lower at home. She also describes vomiting today. There is no nausea or abdominal pain. Initial temperature in the emergency room was 100.5.  Clinical Impression   Pt was seen for evaluation.  She is a very pleasant female who lives alone and is normally fairly independent.  Daughters live [place] of [place].  She was on supplemental O2 at the time of my arrival.  O2 sat= 98%.  Pt was found to be generally weak and deconditioned, needing assist with transfers out of bed and chair.  She was able to ambulate 64' with a walker, mildly unstable gait pattern.  Pt admits to feeling weaker than normal.  O2 sat checked on room air and after gait=94%.  I spoke with pt and daughter about the possibility of returning to SNF at d/c.  They both decline this offer.  Daughter plans to stay with her Mom for a week and they would like HHPT resumed.  I am not aware of any needed DME.    Follow Up Recommendations Home health PT    Equipment Recommendations  None recommended by PT    Recommendations for Other Services   none    Precautions / Restrictions Precautions Precautions: Fall Restrictions Weight Bearing Restrictions: No      Mobility  Bed Mobility Overal bed mobility: Needs Assistance Bed Mobility: Supine to Sit     Supine to sit: Min assist        Transfers Overall transfer level: Needs assistance Equipment used: Rolling walker (2 wheeled) Transfers: Sit to/from Stand Sit to Stand: Min assist            Ambulation/Gait Ambulation/Gait assistance: Supervision Ambulation Distance (Feet): 80 Feet Assistive device: Rolling walker (2  wheeled) Gait Pattern/deviations: Decreased step length - right;Decreased stance time - right;Shuffle   Gait velocity interpretation: Below normal speed for age/gender    Stairs            Wheelchair Mobility    Modified Rankin (Stroke Patients Only)       Balance Overall balance assessment: Needs assistance Sitting-balance support: No upper extremity supported;Feet supported Sitting balance-Leahy Scale: Fair     Standing balance support: Bilateral upper extremity supported Standing balance-Leahy Scale: Fair                               Pertinent Vitals/Pain Pain Assessment: No/denies pain    Home Living Family/patient expects to be discharged to:: Private residence Living Arrangements: Alone (plan to get some CG assist) Available Help at Discharge: Home health;Family;Available PRN/intermittently Type of Home: House Home Access: Stairs to enter;Level entry     Home Layout: One level Home Equipment: Walker - 2 wheels;Bedside commode      Prior Function Level of Independence: Independent with assistive device(s)         Comments: ambulates with a walker     Hand Dominance        Extremity/Trunk Assessment               Lower Extremity Assessment: Generalized weakness      Cervical / Trunk Assessment: Kyphotic (severe scoliosis with leg length discrepancey right <  left)  Communication   Communication: HOH  Cognition Arousal/Alertness: Awake/alert Behavior During Therapy: WFL for tasks assessed/performed Overall Cognitive Status: Within Functional Limits for tasks assessed                      General Comments      Exercises General Exercises - Lower Extremity Ankle Circles/Pumps: AROM;Both;10 reps;Supine Heel Slides: AAROM;Both;10 reps;Supine Hip ABduction/ADduction: AAROM;Both;10 reps;Supine      Assessment/Plan    PT Assessment Patient needs continued PT services  PT Diagnosis Difficulty  walking;Generalized weakness   PT Problem List Decreased strength;Decreased activity tolerance;Decreased mobility;Cardiopulmonary status limiting activity  PT Treatment Interventions Gait training;Functional mobility training;Therapeutic exercise   PT Goals (Current goals can be found in the Care Plan section) Acute Rehab PT Goals Patient Stated Goal: wants to return home PT Goal Formulation: With patient/family Time For Goal Achievement: 10/04/14 Potential to Achieve Goals: Fair    Frequency Min 3X/week   Barriers to discharge Decreased caregiver support      Co-evaluation               End of Session Equipment Utilized During Treatment: Gait belt Activity Tolerance: Patient tolerated treatment well Patient left: in chair;with call bell/phone within reach;with family/visitor present Nurse Communication: Mobility status         Time: 0931-1022 PT Time Calculation (min) (ACUTE ONLY): 51 min   Charges:   PT Evaluation $Initial PT Evaluation Tier I: 1 Procedure PT Treatments $Therapeutic Activity: 8-22 mins   PT G CodesDemetrios Isaacs L 09/20/2014, 10:30 AM

## 2014-09-21 DIAGNOSIS — J690 Pneumonitis due to inhalation of food and vomit: Secondary | ICD-10-CM

## 2014-09-21 LAB — CBC
HCT: 28.2 % — ABNORMAL LOW (ref 36.0–46.0)
Hemoglobin: 8.9 g/dL — ABNORMAL LOW (ref 12.0–15.0)
MCH: 32.5 pg (ref 26.0–34.0)
MCHC: 31.6 g/dL (ref 30.0–36.0)
MCV: 102.9 fL — AB (ref 78.0–100.0)
PLATELETS: 280 10*3/uL (ref 150–400)
RBC: 2.74 MIL/uL — AB (ref 3.87–5.11)
RDW: 14.4 % (ref 11.5–15.5)
WBC: 10.3 10*3/uL (ref 4.0–10.5)

## 2014-09-21 MED ORDER — AMOXICILLIN-POT CLAVULANATE 875-125 MG PO TABS: 1.0000 | ORAL_TABLET | Freq: Two times a day (BID) | ORAL | Status: DC

## 2014-09-21 NOTE — Discharge Summary (Signed)
Physician Discharge Summary  [name] QQP:[id number] DOB: April 21, 1919 DOA: 09/18/2014  PCP: [name], MD  Admit date: 09/18/2014 Discharge date: 09/21/2014  Time spent: 20 minutes  Recommendations for Outpatient Follow-up:  1. Follow up with PCP in 1-2 weeks  Discharge Diagnoses:  Active Problems:   Hypotension   Macrocytic anemia   Chronic diastolic CHF (congestive heart failure)   Dyspnea   Nausea and vomiting   Discharge Condition: Improved  Diet recommendation: Dysphagia 3 with thin liquids  Filed Weights   09/18/14 1345  Weight: 41.277 kg (91 lb)    History of present illness:  Please see admit h and p from 5/9 for details. Briefly, pt presents with dyspnea with fevers and concerns for pneumonia. The patient was admitted for further work up.  Hospital Course:  1. Sepsis with HCAP vs aspiration PNA 1. Presenting septic picture with tachycardia, leukocytosis, tachypnea, hypotension with elevated lactic acid 2. Leukocytosis has improved 3. Patient was initially continued on vancomycin and cefepime, but later transitioned to augmentin to focus on aspiration 4. Remained afebrile through the rest of the course 5. Clinically improved by the day of discharge 2. Chronic diastolic CHF 1. Remained stable 2. Euvolemic 3. Severe protein calorie malnutrition 1. Nutrition was consulted 4. DVT prophylaxis 1. Heparin subQ while inpatient 5. Dysphagia 1. Evaluated by SLP 2. Pt recommended to continue a dysphagia 3 diet with thin liquids  Discharge Exam: Filed Vitals:   09/20/14 1421 09/20/14 1529 09/21/14 0129 09/21/14 0635  BP: 95/33 99/48 94/45  111/89  Pulse: 73 75 78 57  Temp: 97.9 F (36.6 C) 97.8 F (36.6 C) 98.1 F (36.7 C) 98.2 F (36.8 C)  TempSrc: Oral  Oral Oral  Resp: 18 18 18 18   Height:      Weight:      SpO2: 97% 97% 96% 96%    General: Awake, in nad Cardiovascular: regular, s1, s2 Respiratory: normal resp effort, no wheezing  Discharge  Instructions     Medication List    TAKE these medications        acetaminophen 500 MG tablet  Commonly known as:  TYLENOL  Take 500 mg by mouth every 6 (six) hours as needed for pain.     amoxicillin-clavulanate 875-125 MG per tablet  Commonly known as:  AUGMENTIN  Take 1 tablet by mouth every 12 (twelve) hours.     multivitamin with minerals Tabs tablet  Take 1 tablet by mouth daily.     oxyCODONE-acetaminophen 5-325 MG per tablet  Commonly known as:  PERCOCET/ROXICET  Take 1/2 or 2 half tablets by mouth every 4 hours as needed for moderate or severe pain     potassium chloride 20 MEQ packet  Commonly known as:  KLOR-CON  Take 20 mEq by mouth daily. Mixed with water/orange juice     promethazine 12.5 MG tablet  Commonly known as:  PHENERGAN  Take 12.5 mg by mouth daily.     torsemide 10 MG tablet  Commonly known as:  DEMADEX  Take 5 mg by mouth daily.       Allergies  Allergen Reactions  . Sulfa Antibiotics   . Aspirin Other (See Comments)    Makes stomach burn   Follow-up Information    Follow up with [name], MD On 09/28/2014.   Specialty:  Internal Medicine   Why:  at 3:00 pm   Contact information:    [place] [address] [phone]  Follow up with [facility].   Contact information:   [place] [address] [phone]        The results of significant diagnostics from this hospitalization (including imaging, microbiology, ancillary and laboratory) are listed below for reference.    Significant Diagnostic Studies: US Venous Img Lower Unilateral Right  09/04/2014   CLINICAL DATA:  Chronic right lower extremity edema and cellulitis  EXAM: RIGHT LOWER EXTREMITY VENOUS DUPLEX ULTRASOUND  TECHNIQUE: Gray-scale sonography with graded compression, as well as color Doppler and duplex ultrasound were performed to evaluate the right lower extremity deep venous system from the level of the right  common femoral vein and including the common femoral, femoral, profunda femoral, popliteal and calf veins including the posterior tibial, peroneal and gastrocnemius veins when visible. The superficial great saphenous vein was also interrogated. Spectral Doppler was utilized to evaluate flow at rest and with distal augmentation maneuvers in the common femoral, femoral and popliteal veins.  COMPARISON:  None.  FINDINGS: Contralateral Common Femoral Vein: Respiratory phasicity is normal and symmetric with the symptomatic side. No evidence of thrombus. Normal compressibility.  Common Femoral Vein: No evidence of thrombus. Normal compressibility, respiratory phasicity and response to augmentation.  Saphenofemoral Junction: No evidence of thrombus. Normal compressibility and flow on color Doppler imaging.  Profunda Femoral Vein: No evidence of thrombus. Normal compressibility and flow on color Doppler imaging.  Femoral Vein: No evidence of thrombus. Normal compressibility, respiratory phasicity and response to augmentation.  Popliteal Vein: No evidence of thrombus. Normal compressibility, respiratory phasicity and response to augmentation.  Calf Veins: No evidence of thrombus. Normal compressibility and flow on color Doppler imaging.  Superficial Great Saphenous Vein: No evidence of thrombus. Normal compressibility and flow on color Doppler imaging.  Venous Reflux:  None.  Other Findings:  None.  IMPRESSION: No evidence of right lower extremity deep venous thrombosis. Left common femoral vein also patent.   Electronically Signed   By: Lowella Grip III M.D.   On: 09/04/2014 15:10   Dg Chest Port 1 View  09/18/2014   CLINICAL DATA:  Shortness of breath and weakness.  Recent pneumonia.  EXAM: PORTABLE CHEST - 1 VIEW  COMPARISON:  08/16/2014  FINDINGS: There is a marked kyphoscoliosis deformity involving the thoracic spine. This diminishes sensitivity for detecting acute pulmonary abnormalities such as airspace  consolidation. No pleural effusion or edema noted. No airspace consolidation.  IMPRESSION: 1. Diminished exam detail due to marked kyphoscoliosis deformity. 2. No acute findings noted.   Electronically Signed   By: Kerby Moors M.D.   On: 09/18/2014 14:33   Dg Hip Unilat With Pelvis 2-3 Views Left  09/18/2014   CLINICAL DATA:  Left hip and pelvis pain.  No reported injury.  EXAM: LEFT HIP (WITH PELVIS) 2-3 VIEWS  COMPARISON:  None.  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 Mild left hip degenerative changes. Old, healed left superior and inferior pubic ramus fractures and probable old, healed right pubic and ischial fractures. No acute fracture or dislocation seen. Bilateral pelvic and lower abdominal surgical clips. Lumbar spine degenerative changes and scoliosis.  IMPRESSION: 1. Mild left hip degenerative changes. 2. No acute fracture or dislocation. 3. 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 4. Lower lumbar spine degenerative changes and scoliosis.   Electronically Signed   By: Claudie Revering M.D.   On: 09/18/2014 15:16    Microbiology: Recent Results (from the past 240 hour(s))  Culture, blood (routine x 2)  Status: None (Preliminary result)   Collection Time: 09/18/14  2:15 PM  Result Value Ref Range Status   Specimen Description BLOOD LEFT ANTECUBITAL DRAWN BY RN  Final   Special Requests BOTTLES DRAWN AEROBIC AND ANAEROBIC 6CC  Final   Culture NO GROWTH 3 DAYS  Final   Report Status PENDING  Incomplete  Culture, blood (routine x 2)     Status: None (Preliminary result)   Collection Time: 09/18/14  2:22 PM  Result Value Ref Range Status   Specimen Description BLOOD RIGHT ANTECUBITAL  Final   Special Requests   Final    BOTTLES DRAWN AEROBIC AND ANAEROBIC AEB=9CC ANA=12CC   Culture NO GROWTH 3 DAYS  Final   Report Status PENDING  Incomplete  Urine culture     Status: None   Collection Time: 09/19/14  5:25 AM  Result Value Ref Range Status   Specimen  Description URINE, CLEAN CATCH  Final   Special Requests NONE  Final   Colony Count NO GROWTH Performed at [facility]   Final   Culture NO GROWTH Performed at [facility]   Final   Report Status 09/20/2014 FINAL  Final     Labs: Basic Metabolic Panel:  Recent Labs Lab 09/18/14 1415 09/19/14 0610  NA 142 143  K 3.2* 3.8  CL 105 108  CO2 24 28  GLUCOSE 96 137*  BUN 14 16  CREATININE 0.80 0.64  CALCIUM 8.9 8.4*   Liver Function Tests:  Recent Labs Lab 09/18/14 1415 09/19/14 0610  AST 22 16  ALT 9* 8*  ALKPHOS 59 50  BILITOT 0.9 0.6  PROT 6.2* 5.5*  ALBUMIN 3.2* 2.8*   No results for input(s): LIPASE, AMYLASE in the last 168 hours. No results for input(s): AMMONIA in the last 168 hours. CBC:  Recent Labs Lab 09/18/14 1415 09/19/14 0610 09/21/14 0610  WBC 11.9* 13.8* 10.3  NEUTROABS 7.0  --   --   HGB 11.0* 9.5* 8.9*  HCT 35.1* 29.8* 28.2*  MCV 101.7* 99.7 102.9*  PLT 316 293 280   Cardiac Enzymes:  Recent Labs Lab 09/18/14 1415  TROPONINI <0.03   BNP: BNP (last 3 results)  Recent Labs  09/03/14 0600  BNP 148.0*    ProBNP (last 3 results) No results for input(s): PROBNP in the last 8760 hours.  CBG:  Recent Labs Lab 09/18/14 1717  GLUCAP 174*    Signed:  Teira Arcilla K  [facility] 09/21/2014, 2:17 PM

## 2014-09-21 NOTE — Care Management Note (Signed)
Case Management Note  Patient Details  Name: [name] MRN: [id number] Date of Birth: 04-Jul-1918   Expected Discharge Date:  09/21/14               Expected Discharge Plan:  [place]  In-House Referral:  NA  Discharge planning Services  CM Consult  Post Acute Care Choice:  Resumption of Svcs/PTA Provider Choice offered to:     DME Arranged:    DME Agency:     HH Arranged:    [place] Agency:     Status of Service:  Completed, signed off  Medicare Important Message Given:  Yes Date Medicare IM Given:  09/21/14 Medicare IM give by:  Jolene Provost, RN, MSN, CM  Date Additional Medicare IM Given:    Additional Medicare Important Message give by:     If discussed at [place] of Stay Meetings, dates discussed:    Additional Comments: Pt being discharged home today. Pt's order to resume [facility] have been faxed to [facility] and they have been called and notified of DC. Pt has no further CM needs.  Sherald Barge, RN 09/21/2014, 2:00 PM

## 2014-09-21 NOTE — Progress Notes (Signed)
Pt discharged home today per Dr. Wyline Copas.  Pt's IV site D/C'd and WDL.  Pt's VSS.  Pt provided with home medication list, discharge instructions and prescriptions.  Verbalized understanding.  Pt to leave floor via WC.  Pt stable for discharge.  Family at bedside.

## 2014-09-23 LAB — CULTURE, BLOOD (ROUTINE X 2)
CULTURE: NO GROWTH
Culture: NO GROWTH

## 2014-10-27 ENCOUNTER — Emergency Department (HOSPITAL_COMMUNITY): Payer: Medicare Other

## 2014-10-27 ENCOUNTER — Inpatient Hospital Stay (HOSPITAL_COMMUNITY)
Admission: EM | Admit: 2014-10-27 | Discharge: 2014-10-28 | DRG: 388 | Disposition: A | Discharge status: Home / Self Care | Payer: Medicare Other | Attending: Internal Medicine | Admitting: Internal Medicine

## 2014-10-27 ENCOUNTER — Encounter (HOSPITAL_COMMUNITY): Payer: Self-pay | Admitting: Emergency Medicine

## 2014-10-27 DIAGNOSIS — E876 Hypokalemia: Secondary | ICD-10-CM | POA: Diagnosis present

## 2014-10-27 DIAGNOSIS — K560 Paralytic ileus: Secondary | ICD-10-CM | POA: Diagnosis not present

## 2014-10-27 DIAGNOSIS — D649 Anemia, unspecified: Secondary | ICD-10-CM | POA: Diagnosis present

## 2014-10-27 DIAGNOSIS — R109 Unspecified abdominal pain: Secondary | ICD-10-CM

## 2014-10-27 DIAGNOSIS — M1990 Unspecified osteoarthritis, unspecified site: Secondary | ICD-10-CM | POA: Diagnosis present

## 2014-10-27 DIAGNOSIS — Z87891 Personal history of nicotine dependence: Secondary | ICD-10-CM

## 2014-10-27 DIAGNOSIS — E43 Unspecified severe protein-calorie malnutrition: Secondary | ICD-10-CM | POA: Diagnosis present

## 2014-10-27 DIAGNOSIS — I5032 Chronic diastolic (congestive) heart failure: Secondary | ICD-10-CM | POA: Diagnosis present

## 2014-10-27 DIAGNOSIS — Z6820 Body mass index (BMI) 20.0-20.9, adult: Secondary | ICD-10-CM

## 2014-10-27 DIAGNOSIS — R0902 Hypoxemia: Secondary | ICD-10-CM | POA: Diagnosis present

## 2014-10-27 DIAGNOSIS — Z96641 Presence of right artificial hip joint: Secondary | ICD-10-CM | POA: Diagnosis present

## 2014-10-27 LAB — CBC WITH DIFFERENTIAL/PLATELET
Basophils Absolute: 0 10*3/uL (ref 0.0–0.1)
Basophils Relative: 0 % (ref 0–1)
Eosinophils Absolute: 0.1 10*3/uL (ref 0.0–0.7)
Eosinophils Relative: 1 % (ref 0–5)
HCT: 35.5 % — ABNORMAL LOW (ref 36.0–46.0)
HEMOGLOBIN: 11.6 g/dL — AB (ref 12.0–15.0)
LYMPHS ABS: 2.2 10*3/uL (ref 0.7–4.0)
LYMPHS PCT: 18 % (ref 12–46)
MCH: 30.9 pg (ref 26.0–34.0)
MCHC: 32.7 g/dL (ref 30.0–36.0)
MCV: 94.7 fL (ref 78.0–100.0)
MONOS PCT: 13 % — AB (ref 3–12)
Monocytes Absolute: 1.6 10*3/uL — ABNORMAL HIGH (ref 0.1–1.0)
NEUTROS ABS: 8.3 10*3/uL — AB (ref 1.7–7.7)
Neutrophils Relative %: 68 % (ref 43–77)
PLATELETS: 306 10*3/uL (ref 150–400)
RBC: 3.75 MIL/uL — AB (ref 3.87–5.11)
RDW: 14.5 % (ref 11.5–15.5)
WBC: 12.3 10*3/uL — AB (ref 4.0–10.5)

## 2014-10-27 LAB — URINALYSIS, ROUTINE W REFLEX MICROSCOPIC
BILIRUBIN URINE: NEGATIVE
Glucose, UA: NEGATIVE mg/dL
KETONES UR: 15 mg/dL — AB
Leukocytes, UA: NEGATIVE
Nitrite: NEGATIVE
PH: 7 (ref 5.0–8.0)
Protein, ur: NEGATIVE mg/dL
SPECIFIC GRAVITY, URINE: 1.01 (ref 1.005–1.030)
Urobilinogen, UA: 0.2 mg/dL (ref 0.0–1.0)

## 2014-10-27 LAB — COMPREHENSIVE METABOLIC PANEL
ALBUMIN: 3.2 g/dL — AB (ref 3.5–5.0)
ALK PHOS: 59 U/L (ref 38–126)
ALT: 8 U/L — ABNORMAL LOW (ref 14–54)
AST: 19 U/L (ref 15–41)
Anion gap: 13 (ref 5–15)
BILIRUBIN TOTAL: 1.1 mg/dL (ref 0.3–1.2)
BUN: 9 mg/dL (ref 6–20)
CO2: 25 mmol/L (ref 22–32)
Calcium: 8.6 mg/dL — ABNORMAL LOW (ref 8.9–10.3)
Chloride: 103 mmol/L (ref 101–111)
Creatinine, Ser: 0.65 mg/dL (ref 0.44–1.00)
GFR calc Af Amer: >60 mL/min (ref >60)
GFR calc non Af Amer: >60 mL/min (ref >60)
GLUCOSE: 71 mg/dL (ref 65–99)
POTASSIUM: 2.9 mmol/L — AB (ref 3.5–5.1)
SODIUM: 141 mmol/L (ref 135–145)
TOTAL PROTEIN: 6 g/dL — AB (ref 6.5–8.1)

## 2014-10-27 LAB — URINE MICROSCOPIC-ADD ON

## 2014-10-27 LAB — I-STAT CG4 LACTIC ACID, ED: Lactic Acid, Venous: 1.51 mmol/L (ref 0.5–2.0)

## 2014-10-27 LAB — LIPASE, BLOOD: LIPASE: 31 U/L (ref 22–51)

## 2014-10-27 MED ORDER — IOHEXOL 300 MG/ML  SOLN
80.0000 mL | Freq: Once | INTRAMUSCULAR | Status: AC | PRN
  Administered 2014-10-27: 80 mL via INTRAVENOUS

## 2014-10-27 MED ORDER — FENTANYL CITRATE (PF) 100 MCG/2ML IJ SOLN
50.0000 ug | Freq: Once | INTRAMUSCULAR | Status: AC
  Administered 2014-10-27: 50 ug via INTRAVENOUS
  Filled 2014-10-27: qty 2

## 2014-10-27 MED ORDER — SODIUM CHLORIDE 0.9 % IV BOLUS (SEPSIS)
500.0000 mL | Freq: Once | INTRAVENOUS | Status: AC
  Administered 2014-10-27: 500 mL via INTRAVENOUS

## 2014-10-27 MED ORDER — MORPHINE SULFATE 4 MG/ML IJ SOLN
4.0000 mg | Freq: Once | INTRAMUSCULAR | Status: AC
  Administered 2014-10-27: 4 mg via INTRAVENOUS
  Filled 2014-10-27: qty 1

## 2014-10-27 MED ORDER — ONDANSETRON HCL 4 MG/2ML IJ SOLN
INTRAMUSCULAR | Status: AC
  Administered 2014-10-27: 4 mg via INTRAVENOUS
  Filled 2014-10-27: qty 2

## 2014-10-27 MED ORDER — FENTANYL CITRATE (PF) 100 MCG/2ML IJ SOLN
25.0000 ug | Freq: Once | INTRAMUSCULAR | Status: AC
  Administered 2014-10-27: 25 ug via INTRAVENOUS
  Filled 2014-10-27: qty 2

## 2014-10-27 MED ORDER — ONDANSETRON HCL 4 MG/2ML IJ SOLN
4.0000 mg | Freq: Once | INTRAMUSCULAR | Status: AC
  Administered 2014-10-27: 4 mg via INTRAVENOUS

## 2014-10-27 NOTE — ED Notes (Signed)
Pt has white sputum coming up that chocks her. States this has been happening at home since she had pneumonia. Suction set up and on in room for clearing of sputum, sponge to wet lips at the bedside

## 2014-10-27 NOTE — ED Notes (Signed)
Pt 02 dropped, pt laying on side, pt straighten up in bed, placed on 2L 02, care giver at the bedside

## 2014-10-27 NOTE — ED Notes (Signed)
Onset Wednesday abdominal pain, Taking stool softer, complaining of constipation.

## 2014-10-27 NOTE — ED Notes (Signed)
Pt refusing in and out cath, MD aware

## 2014-10-27 NOTE — ED Notes (Signed)
Pt has dry heaves,  Refusing to drink contrast, MD aware, orders given

## 2014-10-27 NOTE — ED Provider Notes (Signed)
CSN: [id number]     Arrival date & time 10/27/14  1911 History   First MD Initiated Contact with Patient 10/27/14 1921     Chief Complaint  Patient presents with  . Abdominal Pain     (Consider location/radiation/quality/duration/timing/severity/associated sxs/prior Treatment) Patient is a 81 y.o. female presenting with abdominal pain. The history is provided by the patient.  Abdominal Pain Associated symptoms: fatigue, nausea and vomiting   Associated symptoms: no chest pain, no diarrhea and no shortness of breath    patient with diffuse abdominal pain. Began over last few days. States she has not had a bowel movement for days. She's been off her pain medicines for a couple days. No fevers. She's had some nausea vomiting a little bit confusion. Still passing gas. Denies dysuria. Denies fevers. Pain is severe. No swelling or legs. No hernias. Has a history of episodes of constipation. Previous colitis in the past also. Recent admission for pneumonia and was hypotensive.  Past Medical History  Diagnosis Date  . Cancer     uterine surg only  . Arthritis   . Kidney stone   . Pancreatic atrophy 12/31/2012  . Colitis 12/31/2012    presumed C.Diff. patient declined colonoscopy  . Pneumonia   . Osteoarthritis   . Muscle weakness (generalized)   . CHF (congestive heart failure)   . Anemia   . Hypotension    Past Surgical History  Procedure Laterality Date  . Joint replacement    . Cholecystectomy    . Right hip surgery    . Left knee surgery and revision    . Esophagogastroduodenoscopy N/A 03/03/2013    RMR: Significant gastric ulcer without bleeding stigmata requiring no endoscopic intervention today. Noncritical Schatzki's ring. Small hiatal hernia  . Esophagogastroduodenoscopy N/A 08/24/2013    Dr. [name]: Hiatal hernia.  Gastric mucosal scar formation at site of previously noted gastric ulcerations  . Abdominal hysterectomy     Family History  Problem Relation Age of Onset  .  Cancer Mother    History  Substance Use Topics  . Smoking status: Former Smoker    Types: Cigarettes    Quit date: 04/25/1951  . Smokeless tobacco: Never Used  . Alcohol Use: No     Comment: 1 glass of wine a day- former   OB History    Gravida Para Term Preterm AB TAB SAB Ectopic Multiple Living   3 2 2  1  1         Review of Systems  Constitutional: Positive for fatigue. Negative for activity change and appetite change.  Eyes: Negative for pain.  Respiratory: Negative for chest tightness and shortness of breath.   Cardiovascular: Negative for chest pain and leg swelling.  Gastrointestinal: Positive for nausea, vomiting and abdominal pain. Negative for diarrhea.  Genitourinary: Negative for flank pain.  Musculoskeletal: Negative for back pain and neck stiffness.  Skin: Negative for rash.  Neurological: Negative for weakness, numbness and headaches.  Psychiatric/Behavioral: Negative for behavioral problems.      Allergies  Sulfa antibiotics and Aspirin  Home Medications   Prior to Admission medications   Medication Sig Start Date End Date Taking? Authorizing Provider  acetaminophen (TYLENOL) 500 MG tablet Take 500 mg by mouth every 6 (six) hours as needed for pain.   Yes Historical Provider, MD  albuterol (PROAIR HFA) 108 (90 BASE) MCG/ACT inhaler Inhale 1-2 puffs into the lungs every 6 (six) hours as needed for wheezing or shortness of breath.   Yes Historical Provider,  MD  Multiple Vitamin (MULTIVITAMIN WITH MINERALS) TABS tablet Take 1 tablet by mouth daily.   Yes Historical Provider, MD  ondansetron (ZOFRAN) 4 MG tablet Take 4 mg by mouth every 6 (six) hours as needed for nausea or vomiting.   Yes Historical Provider, MD  oxyCODONE-acetaminophen (PERCOCET/ROXICET) 5-325 MG per tablet Take 1/2 or 2 half tablets by mouth every 4 hours as needed for moderate or severe pain Patient taking differently: Take 0.5-1 tablets by mouth every 4 (four) hours as needed for moderate  pain or severe pain.  09/04/14  Yes [name], DO  torsemide (DEMADEX) 10 MG tablet Take 5 mg by mouth daily.   Yes Historical Provider, MD  amoxicillin-clavulanate (AUGMENTIN) 875-125 MG per tablet Take 1 tablet by mouth every 12 (twelve) hours. Patient not taking: Reported on 10/27/2014 09/21/14   Donne Hazel, MD   BP 120/78 mmHg  Pulse 74  Temp(Src) 98.6 F (37 C) (Oral)  Resp 27  Ht 4\' 6"  (1.372 m)  Wt 84 lb (38.102 kg)  BMI 20.24 kg/m2  SpO2 100% Physical Exam  Constitutional: She is oriented to person, place, and time. She appears well-developed and well-nourished.  HENT:  Head: Normocephalic and atraumatic.  Eyes: Pupils are equal, round, and reactive to light.  Cardiovascular: Normal rate, regular rhythm and normal heart sounds.   No murmur heard. Pulmonary/Chest: Effort normal and breath sounds normal. No respiratory distress. She has no wheezes. She has no rales.  Abdominal: Soft. She exhibits distension. There is tenderness. There is no rebound and no guarding.  Moderate diffuse tenderness  Musculoskeletal: Normal range of motion.  Neurological: She is alert and oriented to person, place, and time. No cranial nerve deficit.  Skin: Skin is warm and dry.  Psychiatric: Her speech is normal.  Nursing note and vitals reviewed.   ED Course  Procedures (including critical care time) Labs Review Labs Reviewed  CBC WITH DIFFERENTIAL/PLATELET - Abnormal; Notable for the following:    WBC 12.3 (*)    RBC 3.75 (*)    Hemoglobin 11.6 (*)    HCT 35.5 (*)    Neutro Abs 8.3 (*)    Monocytes Relative 13 (*)    Monocytes Absolute 1.6 (*)    All other components within normal limits  COMPREHENSIVE METABOLIC PANEL - Abnormal; Notable for the following:    Potassium 2.9 (*)    Calcium 8.6 (*)    Total Protein 6.0 (*)    Albumin 3.2 (*)    ALT 8 (*)    All other components within normal limits  URINALYSIS, ROUTINE W REFLEX MICROSCOPIC (NOT AT [facility]) - Abnormal; Notable for  the following:    Hgb urine dipstick SMALL (*)    Ketones, ur 15 (*)    All other components within normal limits  URINE MICROSCOPIC-ADD ON - Abnormal; Notable for the following:    Squamous Epithelial / LPF FEW (*)    All other components within normal limits  LIPASE, BLOOD  I-STAT CG4 LACTIC ACID, ED  POC OCCULT BLOOD, ED    Imaging Review Ct Abdomen Pelvis W Contrast  10/27/2014   CLINICAL DATA:  Generalized abdominal pain for 2 days. Nausea, cramping, and constipation.  EXAM: CT ABDOMEN AND PELVIS WITH CONTRAST  TECHNIQUE: Multidetector CT imaging of the abdomen and pelvis was performed using the standard protocol following bolus administration of intravenous contrast.  CONTRAST:  54mL OMNIPAQUE IOHEXOL 300 MG/ML  SOLN  COMPARISON:  07/27/2014  FINDINGS: Suggestion of infiltration  or atelectasis in the left lung base although visualization of the lungs is limited due to motion artifact. Coronary artery calcifications.  Examination is limited due to motion artifact. Calcified granuloma in the liver. No other focal liver lesions appreciated. Surgical absence of the gallbladder. Intra and extrahepatic bile duct dilatation, probably normal for postcholecystectomy state. Pancreas, spleen, adrenal glands, inferior vena cava, and retroperitoneal lymph nodes are unremarkable. Calcification of the abdominal aorta without aneurysm. Stomach is decompressed. Small bowel are not significantly distended. Distal small bowel are fluid filled which may indicate ileus. No wall thickening appreciated. Diffusely stool-filled colon without distention. No free air or free fluid in the abdomen. Stones in the right kidney and right renal pelvis. Renal pelvic stone measures about 10 mm diameter. No hydronephrosis or hydroureter. Nephrograms are symmetrical.  Pelvis: Evaluation of the pelvis is limited due to streak artifact from right hip arthroplasty. Appendix is not identified. Small amount of free fluid in the pelvis of  nonspecific etiology. No specific evidence of the pelvic mass or lymphadenopathy. Bladder wall is not thickened. Surgical clips in the left pelvis. Prominent thoracolumbar scoliosis and degenerative changes. Old fractures of the left superior and inferior pubic rami. Old left rib fractures. Postoperative right total hip arthroplasty with protrusio acetabulum. Diffuse bone demineralization.  IMPRESSION: Technically limited examination as discussed. Fluid filled nondistended small bowel suggesting ileus. No findings suggesting bowel obstruction. Small amount of free fluid in the pelvis is nonspecific but may be reactive or inflammatory. Bile duct dilatation is probably physiologic after cholecystectomy. Nonobstructing right renal stones. Scoliosis and degenerative changes. Postoperative right hip arthroplasty.   Electronically Signed   By: Lucienne Capers M.D.   On: 10/27/2014 23:45   Dg Abd Acute W/chest  10/27/2014   CLINICAL DATA:  81 year old female with generalized abdominal pain, constipation. Initial encounter.  EXAM: DG ABDOMEN ACUTE W/ 1V CHEST  COMPARISON:  Chest radiograph 09/18/2014. CT Abdomen and Pelvis 07/27/2014.  FINDINGS: Severe thoracolumbar scoliosis Re demonstrated. Stable cardiomegaly and mediastinal contours. No pneumothorax or pneumoperitoneum. No definite acute pulmonary opacity.  Right hip arthroplasty and abdominal surgical clips re- identified. Non obstructed bowel gas pattern. Chronic bilateral pubic rami fractures. Advanced osteopenia. Advanced calcified atherosclerosis of the aorta. Right renal pelvis nephrolithiasis measuring 12 mm re- identified and appears stable.  IMPRESSION: 1.  Normal bowel gas pattern, no free air. 2.  Stable cardiomegaly. No acute cardiopulmonary abnormality. 3. Stable right nephrolithiasis. 4. Chronic severe scoliosis, degenerative changes, and pelvic fractures.   Electronically Signed   By: Genevie Ann M.D.   On: 10/27/2014 20:25     EKG  Interpretation   Date/Time:  Friday October 27 2014 19:49:47 EDT Ventricular Rate:  86 PR Interval:  209 QRS Duration: 90 QT Interval:  388 QTC Calculation: 464 R Axis:   16 Text Interpretation:  Sinus or ectopic atrial rhythm Abnormal R-wave  progression, late transition Baseline wander in lead(s) III aVF Confirmed  by Alvino Chapel  MD, Miamarie Moll [id number]) on 10/28/2014 12:53:04 AM      MDM   Final diagnoses:  Abdominal pain, unspecified abdominal location    Patient with abdominal pain and constipation. Initially somewhat distended but is improved somewhat. Still has moderate tenderness. CT scan showed ileus. Lab work otherwise showed a mildly elevated white count but otherwise near her baseline. Will admit to internal medicine for observation.     Davonna Belling, MD 10/28/14 [phone]

## 2014-10-28 ENCOUNTER — Encounter (HOSPITAL_COMMUNITY): Payer: Self-pay | Admitting: Internal Medicine

## 2014-10-28 DIAGNOSIS — D649 Anemia, unspecified: Secondary | ICD-10-CM | POA: Diagnosis present

## 2014-10-28 DIAGNOSIS — K560 Paralytic ileus: Principal | ICD-10-CM

## 2014-10-28 DIAGNOSIS — I5032 Chronic diastolic (congestive) heart failure: Secondary | ICD-10-CM | POA: Diagnosis present

## 2014-10-28 DIAGNOSIS — R109 Unspecified abdominal pain: Secondary | ICD-10-CM | POA: Diagnosis present

## 2014-10-28 DIAGNOSIS — Z96641 Presence of right artificial hip joint: Secondary | ICD-10-CM | POA: Diagnosis present

## 2014-10-28 DIAGNOSIS — R1030 Lower abdominal pain, unspecified: Secondary | ICD-10-CM | POA: Diagnosis not present

## 2014-10-28 DIAGNOSIS — E876 Hypokalemia: Secondary | ICD-10-CM | POA: Diagnosis not present

## 2014-10-28 DIAGNOSIS — E43 Unspecified severe protein-calorie malnutrition: Secondary | ICD-10-CM | POA: Diagnosis present

## 2014-10-28 DIAGNOSIS — Z87891 Personal history of nicotine dependence: Secondary | ICD-10-CM | POA: Diagnosis not present

## 2014-10-28 DIAGNOSIS — Z6820 Body mass index (BMI) 20.0-20.9, adult: Secondary | ICD-10-CM | POA: Diagnosis not present

## 2014-10-28 DIAGNOSIS — M1990 Unspecified osteoarthritis, unspecified site: Secondary | ICD-10-CM | POA: Diagnosis present

## 2014-10-28 DIAGNOSIS — R0902 Hypoxemia: Secondary | ICD-10-CM | POA: Diagnosis present

## 2014-10-28 LAB — CBC
HEMATOCRIT: 31.9 % — AB (ref 36.0–46.0)
Hemoglobin: 10.3 g/dL — ABNORMAL LOW (ref 12.0–15.0)
MCH: 31.1 pg (ref 26.0–34.0)
MCHC: 32.3 g/dL (ref 30.0–36.0)
MCV: 96.4 fL (ref 78.0–100.0)
Platelets: 293 10*3/uL (ref 150–400)
RBC: 3.31 MIL/uL — AB (ref 3.87–5.11)
RDW: 14.8 % (ref 11.5–15.5)
WBC: 10.5 10*3/uL (ref 4.0–10.5)

## 2014-10-28 LAB — COMPREHENSIVE METABOLIC PANEL
ALT: 7 U/L — ABNORMAL LOW (ref 14–54)
AST: 16 U/L (ref 15–41)
Albumin: 2.7 g/dL — ABNORMAL LOW (ref 3.5–5.0)
Alkaline Phosphatase: 48 U/L (ref 38–126)
Anion gap: 9 (ref 5–15)
BUN: 9 mg/dL (ref 6–20)
CO2: 27 mmol/L (ref 22–32)
Calcium: 8.1 mg/dL — ABNORMAL LOW (ref 8.9–10.3)
Chloride: 104 mmol/L (ref 101–111)
Creatinine, Ser: 0.57 mg/dL (ref 0.44–1.00)
GFR calc Af Amer: >60 mL/min (ref >60)
GLUCOSE: 72 mg/dL (ref 65–99)
POTASSIUM: 3.8 mmol/L (ref 3.5–5.1)
Sodium: 140 mmol/L (ref 135–145)
Total Bilirubin: 0.9 mg/dL (ref 0.3–1.2)
Total Protein: 5.1 g/dL — ABNORMAL LOW (ref 6.5–8.1)

## 2014-10-28 LAB — MAGNESIUM: Magnesium: 1.5 mg/dL — ABNORMAL LOW (ref 1.7–2.4)

## 2014-10-28 MED ORDER — MILK AND MOLASSES ENEMA
1.0000 | Freq: Once | RECTAL | Status: AC
  Administered 2014-10-28: 250 mL via RECTAL

## 2014-10-28 MED ORDER — POLYETHYLENE GLYCOL 3350 17 G PO PACK: 17.0000 g | PACK | Freq: Two times a day (BID) | ORAL | Status: DC

## 2014-10-28 MED ORDER — BOOST / RESOURCE BREEZE PO LIQD
1.0000 | Freq: Three times a day (TID) | ORAL | Status: DC
  Administered 2014-10-28: 1 via ORAL

## 2014-10-28 MED ORDER — ONDANSETRON HCL 4 MG PO TABS: 4.0000 mg | ORAL_TABLET | Freq: Four times a day (QID) | ORAL | Status: DC | PRN

## 2014-10-28 MED ORDER — POTASSIUM CHLORIDE 10 MEQ/100ML IV SOLN
10.0000 meq | INTRAVENOUS | Status: AC
  Administered 2014-10-28 (×2): 10 meq via INTRAVENOUS
  Filled 2014-10-28 (×2): qty 100

## 2014-10-28 MED ORDER — ENOXAPARIN SODIUM 30 MG/0.3ML ~~LOC~~ SOLN
30.0000 mg | SUBCUTANEOUS | Status: DC
  Administered 2014-10-28: 30 mg via SUBCUTANEOUS
  Filled 2014-10-28: qty 0.3

## 2014-10-28 MED ORDER — POLYETHYLENE GLYCOL 3350 17 G PO PACK: 17.0000 g | PACK | Freq: Every day | ORAL | Status: DC

## 2014-10-28 MED ORDER — ALBUTEROL SULFATE (2.5 MG/3ML) 0.083% IN NEBU: 3.0000 mL | INHALATION_SOLUTION | Freq: Four times a day (QID) | RESPIRATORY_TRACT | Status: DC | PRN

## 2014-10-28 MED ORDER — BISACODYL 5 MG PO TBEC
10.0000 mg | DELAYED_RELEASE_TABLET | Freq: Once | ORAL | Status: AC
  Administered 2014-10-28: 10 mg via ORAL
  Filled 2014-10-28: qty 2

## 2014-10-28 MED ORDER — ACETAMINOPHEN 500 MG PO TABS: 500.0000 mg | ORAL_TABLET | Freq: Four times a day (QID) | ORAL | Status: DC | PRN

## 2014-10-28 MED ORDER — ADULT MULTIVITAMIN W/MINERALS CH
1.0000 | ORAL_TABLET | Freq: Every day | ORAL | Status: DC
  Administered 2014-10-28: 1 via ORAL
  Filled 2014-10-28: qty 1

## 2014-10-28 MED ORDER — OXYCODONE-ACETAMINOPHEN 5-325 MG PO TABS: 0.5000 | ORAL_TABLET | ORAL | Status: DC | PRN

## 2014-10-28 MED ORDER — CETYLPYRIDINIUM CHLORIDE 0.05 % MT LIQD
7.0000 mL | Freq: Two times a day (BID) | OROMUCOSAL | Status: DC
  Administered 2014-10-28 (×2): 7 mL via OROMUCOSAL

## 2014-10-28 MED ORDER — SODIUM CHLORIDE 0.9 % IJ SOLN
3.0000 mL | Freq: Two times a day (BID) | INTRAMUSCULAR | Status: DC
Start: 2014-10-28 — End: 2014-10-28
  Administered 2014-10-28: 3 mL via INTRAVENOUS

## 2014-10-28 MED ORDER — POLYETHYLENE GLYCOL 3350 17 G PO PACK
17.0000 g | PACK | Freq: Every day | ORAL | Status: DC
  Administered 2014-10-28: 17 g via ORAL
  Filled 2014-10-28: qty 1

## 2014-10-28 MED ORDER — POTASSIUM CHLORIDE IN NACL 20-0.9 MEQ/L-% IV SOLN
INTRAVENOUS | Status: AC
  Administered 2014-10-28: 01:00:00 via INTRAVENOUS

## 2014-10-28 MED ORDER — TORSEMIDE 10 MG PO TABS
5.0000 mg | ORAL_TABLET | Freq: Every day | ORAL | Status: DC
  Administered 2014-10-28: 5 mg via ORAL
  Filled 2014-10-28 (×3): qty 1

## 2014-10-28 NOTE — Progress Notes (Signed)
0049 Patient arrived to 300 unit room# 319 via stretcher from the ED. Alert and oriented, able to make needs known. Patient oriented to room and staff. Bed in lowest position, call bell within reach. Bed alarm activated, high fall risk hall light activated.

## 2014-10-28 NOTE — Progress Notes (Signed)
Discharge instruction reviewed with patient and patient's caregiver. Prescription given to patient. IV removed.

## 2014-10-28 NOTE — Progress Notes (Signed)
Initial Nutrition Assessment  DOCUMENTATION CODES: Severe malnutrition in context of acute illness/injury  INTERVENTION: Resource Breeze po TID, each supplement provides 250 kcal and 9 grams of protein  NUTRITION DIAGNOSIS: Inadequate oral intake related to altered GI function as evidenced by ileus  GOAL: Patient will meet greater than or equal to 90% of their needs  MONITOR: PO intake, Supplement acceptance, Diet advancement, Labs, I & O's, Resolution of Ileus  REASON FOR ASSESSMENT: Malnutrition Screening Tool    ASSESSMENT: 81 yo female PMHx: CHF, Anemia, Hypotension, PNA and osteoarthritis presents with c/o abdominal pain for several days.  Per notes: Pt hasnt had BM since Wednesday. Also has had nausea. CT scan shows ileus.  Labs reviewed: Anemic, Alb: 2.7, Calcium: 8.1  Height: Ht Readings from Last 1 Encounters:  10/28/14 4\' 6"  (1.372 m)   Weight: Wt Readings from Last 1 Encounters:  10/28/14 89 lb 11.2 oz (40.688 kg)    Ideal Body Weight:  40.9 kg  Wt Readings from Last 10 Encounters:  10/28/14 89 lb 11.2 oz (40.688 kg)  09/18/14 91 lb (41.277 kg)  08/15/14 92 lb 2.4 oz (41.8 kg)  07/27/14 93 lb (42.185 kg)  10/05/13 100 lb (45.36 kg)  08/08/13 93 lb (42.185 kg)  06/03/13 99 lb 12.8 oz (45.269 kg)  03/02/13 93 lb 9.6 oz (42.457 kg)  02/23/13 97 lb (43.999 kg)  01/07/13 128 lb 12 oz (58.4 kg)  Admit weight: 84 lbs (38.2 kg): loss of 7 lbs in ~ 1 month. Significant  BMI:  Body mass index is 21.62 kg/(m^2).  20.3 using admit weight  Estimated Nutritional Needs: Kcal:  1250-1350 (33-35 kcal/kg) Protein:  53-10 g (1.4-1.6 g/kg) Fluid:  1.3-1.4 liters  Skin:  Skin tear w/ ecchymosis  Diet Order:  Diet clear liquid Room service appropriate?: Yes; Fluid consistency:: Thin  EDUCATION NEEDS: No education needs identified at this time   Intake/Output Summary (Last 24 hours) at 10/28/14 0853 Last data filed at 10/28/14 0700  Gross per 24 hour  Intake   287.5 ml  Output    200 ml  Net   87.5 ml   Last BM:  6/15  Burtis Junes RD, LDN Nutrition Pager: [phone] 10/28/2014 8:54 AM

## 2014-10-28 NOTE — Discharge Summary (Signed)
Physician Discharge Summary  DARNELLA ZEITER ZOX:[id number] DOB: [birth year] DOA: 10/27/2014  PCP: [name], MD  Admit date: 10/27/2014 Discharge date: 10/28/2014  Time spent: 37minutes  Recommendations for Outpatient Follow-up:  1. Follow up with primary care physician in 1-2 weeks  Discharge Diagnoses:  Principal Problem:   Adynamic ileus Active Problems:   Chronic diastolic CHF (congestive heart failure)   Abdominal pain   Anemia   Hypokalemia   Protein-calorie malnutrition, severe   Discharge Condition: improved  Diet recommendation: low salt  Filed Weights   10/27/14 1918 10/28/14 0107 10/28/14 0500  Weight: 38.102 kg (84 lb) 40.688 kg (89 lb 11.2 oz) 40.688 kg (89 lb 11.2 oz)    History of present illness:  This patient was admitted to the hospital with abdominal pain. She describes several days of crampy abdominal pain which is worse on the day of admission. She did not have a bowel movement in several days. She had nausea. Evaluation in the emergency room with CT scan of the abdomen and pelvis indicated an ileus. She was admitted for further treatment.  Hospital Course:  She was treated with milk and molasses enema as well as MiraLAX. She had good bowel movement results. She felt significantly improved after moving her bowels. She no longer had any abdominal pain or nausea. She was placed on a MiraLAX regimen to promote regular bowel movements. She is feeling significantly improved and is feels ready for discharge home. The remainder of her medical issues are stable.  Procedures:    Consultations:    Discharge Exam: Filed Vitals:   10/28/14 1455  BP: 91/45  Pulse: 88  Temp: 98.3 F (36.8 C)  Resp: 18    General: NAD Cardiovascular: s1, s2, rrr Respiratory: CTA B  Discharge Instructions   Discharge Instructions    Diet - low sodium heart healthy    Complete by:  As directed      Increase activity slowly    Complete by:  As directed            Current Discharge Medication List    START taking these medications   Details  polyethylene glycol (MIRALAX / GLYCOLAX) packet Take 17 g by mouth daily. Qty: 14 each, Refills: 0      CONTINUE these medications which have NOT CHANGED   Details  acetaminophen (TYLENOL) 500 MG tablet Take 500 mg by mouth every 6 (six) hours as needed for pain.    albuterol (PROAIR HFA) 108 (90 BASE) MCG/ACT inhaler Inhale 1-2 puffs into the lungs every 6 (six) hours as needed for wheezing or shortness of breath.    Multiple Vitamin (MULTIVITAMIN WITH MINERALS) TABS tablet Take 1 tablet by mouth daily.    ondansetron (ZOFRAN) 4 MG tablet Take 4 mg by mouth every 6 (six) hours as needed for nausea or vomiting.    oxyCODONE-acetaminophen (PERCOCET/ROXICET) 5-325 MG per tablet Take 1/2 or 2 half tablets by mouth every 4 hours as needed for moderate or severe pain Qty: 180 tablet, Refills: 0    torsemide (DEMADEX) 10 MG tablet Take 5 mg by mouth daily.      STOP taking these medications     amoxicillin-clavulanate (AUGMENTIN) 875-125 MG per tablet        Allergies  Allergen Reactions  . Sulfa Antibiotics   . Aspirin Other (See Comments)    Makes stomach burn   Follow-up Information    Follow up with [name], MD. Schedule an appointment as soon as possible  for a visit in 2 weeks.   Specialty:  Internal Medicine   Contact information:    [place] [id number] [phone]        The results of significant diagnostics from this hospitalization (including imaging, microbiology, ancillary and laboratory) are listed below for reference.    Significant Diagnostic Studies: Ct Abdomen Pelvis W Contrast  10/27/2014   CLINICAL DATA:  Generalized abdominal pain for 2 days. Nausea, cramping, and constipation.  EXAM: CT ABDOMEN AND PELVIS WITH CONTRAST  TECHNIQUE: Multidetector CT imaging of the abdomen and pelvis was performed using the standard protocol following bolus  administration of intravenous contrast.  CONTRAST:  75mL OMNIPAQUE IOHEXOL 300 MG/ML  SOLN  COMPARISON:  07/27/2014  FINDINGS: Suggestion of infiltration or atelectasis in the left lung base although visualization of the lungs is limited due to motion artifact. Coronary artery calcifications.  Examination is limited due to motion artifact. Calcified granuloma in the liver. No other focal liver lesions appreciated. Surgical absence of the gallbladder. Intra and extrahepatic bile duct dilatation, probably normal for postcholecystectomy state. Pancreas, spleen, adrenal glands, inferior vena cava, and retroperitoneal lymph nodes are unremarkable. Calcification of the abdominal aorta without aneurysm. Stomach is decompressed. Small bowel are not significantly distended. Distal small bowel are fluid filled which may indicate ileus. No wall thickening appreciated. Diffusely stool-filled colon without distention. No free air or free fluid in the abdomen. Stones in the right kidney and right renal pelvis. Renal pelvic stone measures about 10 mm diameter. No hydronephrosis or hydroureter. Nephrograms are symmetrical.  Pelvis: Evaluation of the pelvis is limited due to streak artifact from right hip arthroplasty. Appendix is not identified. Small amount of free fluid in the pelvis of nonspecific etiology. No specific evidence of the pelvic mass or lymphadenopathy. Bladder wall is not thickened. Surgical clips in the left pelvis. Prominent thoracolumbar scoliosis and degenerative changes. Old fractures of the left superior and inferior pubic rami. Old left rib fractures. Postoperative right total hip arthroplasty with protrusio acetabulum. Diffuse bone demineralization.  IMPRESSION: Technically limited examination as discussed. Fluid filled nondistended small bowel suggesting ileus. No findings suggesting bowel obstruction. Small amount of free fluid in the pelvis is nonspecific but may be reactive or inflammatory. Bile duct  dilatation is probably physiologic after cholecystectomy. Nonobstructing right renal stones. Scoliosis and degenerative changes. Postoperative right hip arthroplasty.   Electronically Signed   By: Lucienne Capers M.D.   On: 10/27/2014 23:45   Dg Abd Acute W/chest  10/27/2014   CLINICAL DATA:  81 year old female with generalized abdominal pain, constipation. Initial encounter.  EXAM: DG ABDOMEN ACUTE W/ 1V CHEST  COMPARISON:  Chest radiograph 09/18/2014. CT Abdomen and Pelvis 07/27/2014.  FINDINGS: Severe thoracolumbar scoliosis Re demonstrated. Stable cardiomegaly and mediastinal contours. No pneumothorax or pneumoperitoneum. No definite acute pulmonary opacity.  Right hip arthroplasty and abdominal surgical clips re- identified. Non obstructed bowel gas pattern. Chronic bilateral pubic rami fractures. Advanced osteopenia. Advanced calcified atherosclerosis of the aorta. Right renal pelvis nephrolithiasis measuring 12 mm re- identified and appears stable.  IMPRESSION: 1.  Normal bowel gas pattern, no free air. 2.  Stable cardiomegaly. No acute cardiopulmonary abnormality. 3. Stable right nephrolithiasis. 4. Chronic severe scoliosis, degenerative changes, and pelvic fractures.   Electronically Signed   By: Genevie Ann M.D.   On: 10/27/2014 20:25    Microbiology: No results found for this or any previous visit (from the past 240 hour(s)).   Labs: Basic Metabolic Panel:  Recent Labs  Lab 10/27/14 1959 10/28/14 0614  NA 141 140  K 2.9* 3.8  CL 103 104  CO2 25 27  GLUCOSE 71 72  BUN 9 9  CREATININE 0.65 0.57  CALCIUM 8.6* 8.1*  MG  --  1.5*   Liver Function Tests:  Recent Labs Lab 10/27/14 1959 10/28/14 0614  AST 19 16  ALT 8* 7*  ALKPHOS 59 48  BILITOT 1.1 0.9  PROT 6.0* 5.1*  ALBUMIN 3.2* 2.7*    Recent Labs Lab 10/27/14 1959  LIPASE 31   No results for input(s): AMMONIA in the last 168 hours. CBC:  Recent Labs Lab 10/27/14 1959 10/28/14 0614  WBC 12.3* 10.5  NEUTROABS  8.3*  --   HGB 11.6* 10.3*  HCT 35.5* 31.9*  MCV 94.7 96.4  PLT 306 293   Cardiac Enzymes: No results for input(s): CKTOTAL, CKMB, CKMBINDEX, TROPONINI in the last 168 hours. BNP: BNP (last 3 results)  Recent Labs  09/03/14 0600  BNP 148.0*    ProBNP (last 3 results) No results for input(s): PROBNP in the last 8760 hours.  CBG: No results for input(s): GLUCAP in the last 168 hours.     Signed:  Laquinton Bihm  [facility] 10/28/2014, 3:27 PM

## 2014-10-28 NOTE — Progress Notes (Signed)
Good results noted with enema given. Patient tolerated enema well.

## 2014-10-28 NOTE — H&P (Signed)
[name] is an 81 y.o. female.    Zack Hall (pcp)  Chief Complaint: abdominal pain HPI: 81 yo female with c/o abdominal pain for several days,  "cramps",  Worse today.  Last bm Wednesday.  + constipation.  + nausea.   Pt denies fever, chills, cp, palp, emesis, diarrhea, brbpr, black stool.  Pt was brought to ED for evaluation, and CT scan abd/pelvis, => ileus.  Pt will be admitted observation for ileus/abdominal pain.   Past Medical History  Diagnosis Date  . Cancer     uterine surg only  . Arthritis   . Kidney stone   . Pancreatic atrophy 12/31/2012  . Colitis 12/31/2012    presumed C.Diff. patient declined colonoscopy  . Pneumonia   . Osteoarthritis   . Muscle weakness (generalized)   . CHF (congestive heart failure)   . Anemia   . Hypotension     Past Surgical History  Procedure Laterality Date  . Joint replacement    . Cholecystectomy    . Right hip surgery    . Left knee surgery and revision    . Esophagogastroduodenoscopy N/A 03/03/2013    RMR: Significant gastric ulcer without bleeding stigmata requiring no endoscopic intervention today. Noncritical Schatzki's ring. Small hiatal hernia  . Esophagogastroduodenoscopy N/A 08/24/2013    Dr. [name]: Hiatal hernia.  Gastric mucosal scar formation at site of previously noted gastric ulcerations  . Abdominal hysterectomy      Family History  Problem Relation Age of Onset  . Cancer Mother    Social History:  reports that she quit smoking about 63 years ago. Her smoking use included Cigarettes. She has never used smokeless tobacco. She reports that she does not drink alcohol or use illicit drugs.  Allergies:  Allergies  Allergen Reactions  . Sulfa Antibiotics   . Aspirin Other (See Comments)    Makes stomach burn  Medications reviewed   Results for orders placed or performed during the hospital encounter of 10/27/14 (from the past 48 hour(s))  CBC with Differential     Status: Abnormal   Collection Time:  10/27/14  7:59 PM  Result Value Ref Range   WBC 12.3 (H) 4.0 - 10.5 K/uL   RBC 3.75 (L) 3.87 - 5.11 MIL/uL   Hemoglobin 11.6 (L) 12.0 - 15.0 g/dL   HCT 35.5 (L) 36.0 - 46.0 %   MCV 94.7 78.0 - 100.0 fL   MCH 30.9 26.0 - 34.0 pg   MCHC 32.7 30.0 - 36.0 g/dL   RDW 14.5 11.5 - 15.5 %   Platelets 306 150 - 400 K/uL   Neutrophils Relative % 68 43 - 77 %   Neutro Abs 8.3 (H) 1.7 - 7.7 K/uL   Lymphocytes Relative 18 12 - 46 %   Lymphs Abs 2.2 0.7 - 4.0 K/uL   Monocytes Relative 13 (H) 3 - 12 %   Monocytes Absolute 1.6 (H) 0.1 - 1.0 K/uL   Eosinophils Relative 1 0 - 5 %   Eosinophils Absolute 0.1 0.0 - 0.7 K/uL   Basophils Relative 0 0 - 1 %   Basophils Absolute 0.0 0.0 - 0.1 K/uL  Comprehensive metabolic panel     Status: Abnormal   Collection Time: 10/27/14  7:59 PM  Result Value Ref Range   Sodium 141 135 - 145 mmol/L   Potassium 2.9 (L) 3.5 - 5.1 mmol/L   Chloride 103 101 - 111 mmol/L   CO2 25 22 - 32 mmol/L   Glucose, Bld  71 65 - 99 mg/dL   BUN 9 6 - 20 mg/dL   Creatinine, Ser 0.65 0.44 - 1.00 mg/dL   Calcium 8.6 (L) 8.9 - 10.3 mg/dL   Total Protein 6.0 (L) 6.5 - 8.1 g/dL   Albumin 3.2 (L) 3.5 - 5.0 g/dL   AST 19 15 - 41 U/L   ALT 8 (L) 14 - 54 U/L   Alkaline Phosphatase 59 38 - 126 U/L   Total Bilirubin 1.1 0.3 - 1.2 mg/dL   GFR calc non Af Amer >60 >60 mL/min   GFR calc Af Amer >60 >60 mL/min    Comment: (NOTE) The eGFR has been calculated using the CKD EPI equation. This calculation has not been validated in all clinical situations. eGFR's persistently <60 mL/min signify possible Chronic Kidney Disease.    Anion gap 13 5 - 15  Lipase, blood     Status: None   Collection Time: 10/27/14  7:59 PM  Result Value Ref Range   Lipase 31 22 - 51 U/L  I-Stat CG4 Lactic Acid, ED     Status: None   Collection Time: 10/27/14  9:02 PM  Result Value Ref Range   Lactic Acid, Venous 1.51 0.5 - 2.0 mmol/L  Urinalysis, Routine w reflex microscopic (not at [facility])     Status:  Abnormal   Collection Time: 10/27/14 11:04 PM  Result Value Ref Range   Color, Urine YELLOW YELLOW   APPearance CLEAR CLEAR   Specific Gravity, Urine 1.010 1.005 - 1.030   pH 7.0 5.0 - 8.0   Glucose, UA NEGATIVE NEGATIVE mg/dL   Hgb urine dipstick SMALL (A) NEGATIVE   Bilirubin Urine NEGATIVE NEGATIVE   Ketones, ur 15 (A) NEGATIVE mg/dL   Protein, ur NEGATIVE NEGATIVE mg/dL   Urobilinogen, UA 0.2 0.0 - 1.0 mg/dL   Nitrite NEGATIVE NEGATIVE   Leukocytes, UA NEGATIVE NEGATIVE  Urine microscopic-add on     Status: Abnormal   Collection Time: 10/27/14 11:04 PM  Result Value Ref Range   Squamous Epithelial / LPF FEW (A) RARE   WBC, UA 0-2 <3 WBC/hpf   RBC / HPF 3-6 <3 RBC/hpf   Bacteria, UA RARE RARE   Ct Abdomen Pelvis W Contrast  10/27/2014   CLINICAL DATA:  Generalized abdominal pain for 2 days. Nausea, cramping, and constipation.  EXAM: CT ABDOMEN AND PELVIS WITH CONTRAST  TECHNIQUE: Multidetector CT imaging of the abdomen and pelvis was performed using the standard protocol following bolus administration of intravenous contrast.  CONTRAST:  20m OMNIPAQUE IOHEXOL 300 MG/ML  SOLN  COMPARISON:  07/27/2014  FINDINGS: Suggestion of infiltration or atelectasis in the left lung base although visualization of the lungs is limited due to motion artifact. Coronary artery calcifications.  Examination is limited due to motion artifact. Calcified granuloma in the liver. No other focal liver lesions appreciated. Surgical absence of the gallbladder. Intra and extrahepatic bile duct dilatation, probably normal for postcholecystectomy state. Pancreas, spleen, adrenal glands, inferior vena cava, and retroperitoneal lymph nodes are unremarkable. Calcification of the abdominal aorta without aneurysm. Stomach is decompressed. Small bowel are not significantly distended. Distal small bowel are fluid filled which may indicate ileus. No wall thickening appreciated. Diffusely stool-filled colon without distention.  No free air or free fluid in the abdomen. Stones in the right kidney and right renal pelvis. Renal pelvic stone measures about 10 mm diameter. No hydronephrosis or hydroureter. Nephrograms are symmetrical.  Pelvis: Evaluation of the pelvis is limited due to streak artifact from right  hip arthroplasty. Appendix is not identified. Small amount of free fluid in the pelvis of nonspecific etiology. No specific evidence of the pelvic mass or lymphadenopathy. Bladder wall is not thickened. Surgical clips in the left pelvis. Prominent thoracolumbar scoliosis and degenerative changes. Old fractures of the left superior and inferior pubic rami. Old left rib fractures. Postoperative right total hip arthroplasty with protrusio acetabulum. Diffuse bone demineralization.  IMPRESSION: Technically limited examination as discussed. Fluid filled nondistended small bowel suggesting ileus. No findings suggesting bowel obstruction. Small amount of free fluid in the pelvis is nonspecific but may be reactive or inflammatory. Bile duct dilatation is probably physiologic after cholecystectomy. Nonobstructing right renal stones. Scoliosis and degenerative changes. Postoperative right hip arthroplasty.   Electronically Signed   By: Lucienne Capers M.D.   On: 10/27/2014 23:45   Dg Abd Acute W/chest  10/27/2014   CLINICAL DATA:  81 year old female with generalized abdominal pain, constipation. Initial encounter.  EXAM: DG ABDOMEN ACUTE W/ 1V CHEST  COMPARISON:  Chest radiograph 09/18/2014. CT Abdomen and Pelvis 07/27/2014.  FINDINGS: Severe thoracolumbar scoliosis Re demonstrated. Stable cardiomegaly and mediastinal contours. No pneumothorax or pneumoperitoneum. No definite acute pulmonary opacity.  Right hip arthroplasty and abdominal surgical clips re- identified. Non obstructed bowel gas pattern. Chronic bilateral pubic rami fractures. Advanced osteopenia. Advanced calcified atherosclerosis of the aorta. Right renal pelvis  nephrolithiasis measuring 12 mm re- identified and appears stable.  IMPRESSION: 1.  Normal bowel gas pattern, no free air. 2.  Stable cardiomegaly. No acute cardiopulmonary abnormality. 3. Stable right nephrolithiasis. 4. Chronic severe scoliosis, degenerative changes, and pelvic fractures.   Electronically Signed   By: Genevie Ann M.D.   On: 10/27/2014 20:25    Review of Systems  Constitutional: Negative for fever, chills, weight loss, malaise/fatigue and diaphoresis.  HENT: Negative.   Eyes: Negative.   Respiratory: Negative.   Cardiovascular: Negative.   Gastrointestinal: Positive for nausea and abdominal pain. Negative for heartburn, vomiting, diarrhea, constipation, blood in stool and melena.  Genitourinary: Negative.   Musculoskeletal: Negative.   Skin: Negative.   Neurological: Negative.  Negative for weakness.  Endo/Heme/Allergies: Negative.   Psychiatric/Behavioral: Negative.     Blood pressure 120/78, pulse 74, temperature 98.6 F (37 C), temperature source Oral, resp. rate 27, height _0  (1.372 m), weight 38.102 kg (84 lb), SpO2 100 %. Physical Exam  Constitutional: She is oriented to person, place, and time. She appears well-developed and well-nourished.  HENT:  Head: Normocephalic and atraumatic.  Mouth/Throat: No oropharyngeal exudate.  Eyes: Conjunctivae and EOM are normal. Pupils are equal, round, and reactive to light. No scleral icterus.  Neck: Normal range of motion. Neck supple. No JVD present. No tracheal deviation present. No thyromegaly present.  Cardiovascular: Normal rate and regular rhythm.  Exam reveals no gallop and no friction rub.   No murmur heard. Respiratory: Effort normal and breath sounds normal. No respiratory distress. She has no wheezes. She has no rales.  GI: Soft. Bowel sounds are normal. She exhibits no distension. There is no tenderness. There is no rebound and no guarding.  Musculoskeletal: Normal range of motion. She exhibits no edema or  tenderness.  Lymphadenopathy:    She has no cervical adenopathy.  Neurological: She is alert and oriented to person, place, and time. She has normal reflexes. She displays normal reflexes. No cranial nerve deficit. She exhibits normal muscle tone. Coordination normal.  Skin: Skin is warm and dry. No rash noted. No erythema. No pallor.  Psychiatric: She has a  normal mood and affect. Her behavior is normal. Judgment and thought content normal.     Assessment/Plan Abdominal pain secondary to constipation miralax  Ileus Observe  Hypokalemia Replete  Anemia Repeat cbc in am  Hypoxia: ? Due to pain medication  DVT, scd,   Jani Gravel 10/28/2014, 12:13 AM

## 2014-10-30 LAB — OCCULT BLOOD, POC DEVICE: Fecal Occult Bld: NEGATIVE

## 2014-11-06 ENCOUNTER — Other Ambulatory Visit: Payer: Self-pay

## 2015-03-13 ENCOUNTER — Emergency Department (HOSPITAL_COMMUNITY): Payer: Medicare Other

## 2015-03-13 ENCOUNTER — Encounter (HOSPITAL_COMMUNITY): Payer: Self-pay | Admitting: *Deleted

## 2015-03-13 ENCOUNTER — Observation Stay (HOSPITAL_COMMUNITY)
Admission: EM | Admit: 2015-03-13 | Discharge: 2015-03-15 | Disposition: A | Discharge status: Home / Self Care | Payer: Medicare Other | Attending: Internal Medicine | Admitting: Internal Medicine

## 2015-03-13 DIAGNOSIS — K8689 Other specified diseases of pancreas: Secondary | ICD-10-CM | POA: Diagnosis not present

## 2015-03-13 DIAGNOSIS — Z79899 Other long term (current) drug therapy: Secondary | ICD-10-CM | POA: Insufficient documentation

## 2015-03-13 DIAGNOSIS — J159 Unspecified bacterial pneumonia: Secondary | ICD-10-CM | POA: Diagnosis not present

## 2015-03-13 DIAGNOSIS — D649 Anemia, unspecified: Secondary | ICD-10-CM | POA: Diagnosis not present

## 2015-03-13 DIAGNOSIS — R509 Fever, unspecified: Secondary | ICD-10-CM | POA: Insufficient documentation

## 2015-03-13 DIAGNOSIS — M1990 Unspecified osteoarthritis, unspecified site: Secondary | ICD-10-CM | POA: Insufficient documentation

## 2015-03-13 DIAGNOSIS — K529 Noninfective gastroenteritis and colitis, unspecified: Secondary | ICD-10-CM | POA: Insufficient documentation

## 2015-03-13 DIAGNOSIS — I509 Heart failure, unspecified: Secondary | ICD-10-CM | POA: Diagnosis not present

## 2015-03-13 DIAGNOSIS — J189 Pneumonia, unspecified organism: Secondary | ICD-10-CM | POA: Diagnosis present

## 2015-03-13 DIAGNOSIS — N200 Calculus of kidney: Secondary | ICD-10-CM | POA: Insufficient documentation

## 2015-03-13 DIAGNOSIS — I5032 Chronic diastolic (congestive) heart failure: Secondary | ICD-10-CM | POA: Diagnosis present

## 2015-03-13 DIAGNOSIS — Z87891 Personal history of nicotine dependence: Secondary | ICD-10-CM | POA: Insufficient documentation

## 2015-03-13 DIAGNOSIS — Z8701 Personal history of pneumonia (recurrent): Secondary | ICD-10-CM | POA: Diagnosis not present

## 2015-03-13 DIAGNOSIS — Z8541 Personal history of malignant neoplasm of cervix uteri: Secondary | ICD-10-CM | POA: Insufficient documentation

## 2015-03-13 DIAGNOSIS — R079 Chest pain, unspecified: Secondary | ICD-10-CM | POA: Diagnosis present

## 2015-03-13 DIAGNOSIS — K279 Peptic ulcer, site unspecified, unspecified as acute or chronic, without hemorrhage or perforation: Secondary | ICD-10-CM | POA: Diagnosis present

## 2015-03-13 DIAGNOSIS — M6281 Muscle weakness (generalized): Secondary | ICD-10-CM | POA: Insufficient documentation

## 2015-03-13 DIAGNOSIS — E43 Unspecified severe protein-calorie malnutrition: Secondary | ICD-10-CM | POA: Diagnosis not present

## 2015-03-13 LAB — COMPREHENSIVE METABOLIC PANEL
ALK PHOS: 50 U/L (ref 38–126)
ALT: 9 U/L — AB (ref 14–54)
ANION GAP: 11 (ref 5–15)
AST: 22 U/L (ref 15–41)
Albumin: 3.8 g/dL (ref 3.5–5.0)
BILIRUBIN TOTAL: 0.8 mg/dL (ref 0.3–1.2)
BUN: 12 mg/dL (ref 6–20)
CALCIUM: 9.3 mg/dL (ref 8.9–10.3)
CO2: 25 mmol/L (ref 22–32)
CREATININE: 0.61 mg/dL (ref 0.44–1.00)
Chloride: 105 mmol/L (ref 101–111)
Glucose, Bld: 98 mg/dL (ref 65–99)
Potassium: 3.2 mmol/L — ABNORMAL LOW (ref 3.5–5.1)
Sodium: 141 mmol/L (ref 135–145)
TOTAL PROTEIN: 6.2 g/dL — AB (ref 6.5–8.1)

## 2015-03-13 LAB — INFLUENZA PANEL BY PCR (TYPE A & B)
H1N1FLUPCR: NOT DETECTED
Influenza A By PCR: NEGATIVE
Influenza B By PCR: NEGATIVE

## 2015-03-13 LAB — TROPONIN I
Troponin I: <0.03 ng/mL (ref <0.031)
Troponin I: <0.03 ng/mL (ref <0.031)
Troponin I: <0.03 ng/mL (ref <0.031)

## 2015-03-13 LAB — URINE MICROSCOPIC-ADD ON

## 2015-03-13 LAB — URINALYSIS, ROUTINE W REFLEX MICROSCOPIC
BILIRUBIN URINE: NEGATIVE
GLUCOSE, UA: NEGATIVE mg/dL
Leukocytes, UA: NEGATIVE
Nitrite: NEGATIVE
PROTEIN: NEGATIVE mg/dL
Specific Gravity, Urine: 1.01 (ref 1.005–1.030)
UROBILINOGEN UA: 0.2 mg/dL (ref 0.0–1.0)
pH: 5.5 (ref 5.0–8.0)

## 2015-03-13 LAB — BASIC METABOLIC PANEL
ANION GAP: 6 (ref 5–15)
BUN: 14 mg/dL (ref 6–20)
CO2: 27 mmol/L (ref 22–32)
Calcium: 8.6 mg/dL — ABNORMAL LOW (ref 8.9–10.3)
Chloride: 106 mmol/L (ref 101–111)
Creatinine, Ser: 0.71 mg/dL (ref 0.44–1.00)
GFR calc Af Amer: >60 mL/min (ref >60)
GFR calc non Af Amer: >60 mL/min (ref >60)
Glucose, Bld: 105 mg/dL — ABNORMAL HIGH (ref 65–99)
Potassium: 3.6 mmol/L (ref 3.5–5.1)
Sodium: 139 mmol/L (ref 135–145)

## 2015-03-13 LAB — CBC WITH DIFFERENTIAL/PLATELET
Basophils Absolute: 0 10*3/uL (ref 0.0–0.1)
Basophils Relative: 0 %
Eosinophils Absolute: 0.1 10*3/uL (ref 0.0–0.7)
Eosinophils Relative: 1 %
HCT: 30.4 % — ABNORMAL LOW (ref 36.0–46.0)
HEMOGLOBIN: 10 g/dL — AB (ref 12.0–15.0)
LYMPHS ABS: 0.3 10*3/uL — AB (ref 0.7–4.0)
LYMPHS PCT: 2 %
MCH: 31.4 pg (ref 26.0–34.0)
MCHC: 32.9 g/dL (ref 30.0–36.0)
MCV: 95.6 fL (ref 78.0–100.0)
Monocytes Absolute: 0.9 10*3/uL (ref 0.1–1.0)
Monocytes Relative: 6 %
NEUTROS ABS: 13.1 10*3/uL — AB (ref 1.7–7.7)
NEUTROS PCT: 91 %
Platelets: 202 10*3/uL (ref 150–400)
RBC: 3.18 MIL/uL — AB (ref 3.87–5.11)
RDW: 14.8 % (ref 11.5–15.5)
WBC: 14.5 10*3/uL — AB (ref 4.0–10.5)

## 2015-03-13 LAB — CBC
HCT: 35.9 % — ABNORMAL LOW (ref 36.0–46.0)
HEMOGLOBIN: 11.9 g/dL — AB (ref 12.0–15.0)
MCH: 31.6 pg (ref 26.0–34.0)
MCHC: 33.1 g/dL (ref 30.0–36.0)
MCV: 95.5 fL (ref 78.0–100.0)
PLATELETS: 211 10*3/uL (ref 150–400)
RBC: 3.76 MIL/uL — ABNORMAL LOW (ref 3.87–5.11)
RDW: 14.9 % (ref 11.5–15.5)
WBC: 11.5 10*3/uL — ABNORMAL HIGH (ref 4.0–10.5)

## 2015-03-13 LAB — LIPASE, BLOOD: Lipase: 32 U/L (ref 11–51)

## 2015-03-13 LAB — LACTIC ACID, PLASMA
LACTIC ACID, VENOUS: 1.5 mmol/L (ref 0.5–2.0)
Lactic Acid, Venous: 1.8 mmol/L (ref 0.5–2.0)

## 2015-03-13 MED ORDER — ACETAMINOPHEN 325 MG PO TABS
650.0000 mg | ORAL_TABLET | Freq: Four times a day (QID) | ORAL | Status: DC | PRN
  Administered 2015-03-13: 650 mg via ORAL
  Filled 2015-03-13: qty 2

## 2015-03-13 MED ORDER — DEXTROSE 5 % IV SOLN
1.0000 g | INTRAVENOUS | Status: DC
  Administered 2015-03-14 – 2015-03-15 (×2): 1 g via INTRAVENOUS
  Filled 2015-03-13 (×3): qty 10

## 2015-03-13 MED ORDER — DOCUSATE SODIUM 100 MG PO CAPS
100.0000 mg | ORAL_CAPSULE | Freq: Two times a day (BID) | ORAL | Status: DC
  Administered 2015-03-13 – 2015-03-14 (×3): 100 mg via ORAL
  Filled 2015-03-13 (×4): qty 1

## 2015-03-13 MED ORDER — SODIUM CHLORIDE 0.9 % IV SOLN
INTRAVENOUS | Status: DC
  Administered 2015-03-13: 06:00:00 via INTRAVENOUS

## 2015-03-13 MED ORDER — DEXTROSE 5 % IV SOLN
1.0000 g | Freq: Once | INTRAVENOUS | Status: AC
  Administered 2015-03-13: 1 g via INTRAVENOUS
  Filled 2015-03-13: qty 10

## 2015-03-13 MED ORDER — CALCIUM CARBONATE ANTACID 500 MG PO CHEW: 1.0000 | CHEWABLE_TABLET | Freq: Four times a day (QID) | ORAL | Status: DC | PRN

## 2015-03-13 MED ORDER — DEXTROSE 5 % IV SOLN
500.0000 mg | INTRAVENOUS | Status: DC
  Administered 2015-03-14 (×2): 500 mg via INTRAVENOUS
  Filled 2015-03-13 (×3): qty 500

## 2015-03-13 MED ORDER — FENTANYL CITRATE (PF) 100 MCG/2ML IJ SOLN
12.5000 ug | Freq: Once | INTRAMUSCULAR | Status: AC
  Administered 2015-03-13: 12.5 ug via INTRAVENOUS
  Filled 2015-03-13: qty 2

## 2015-03-13 MED ORDER — SODIUM CHLORIDE 0.9 % IV BOLUS (SEPSIS)
500.0000 mL | Freq: Once | INTRAVENOUS | Status: AC
  Administered 2015-03-13: 500 mL via INTRAVENOUS

## 2015-03-13 MED ORDER — DEXTROSE 5 % IV SOLN
500.0000 mg | Freq: Once | INTRAVENOUS | Status: AC
  Administered 2015-03-13: 500 mg via INTRAVENOUS
  Filled 2015-03-13: qty 500

## 2015-03-13 MED ORDER — ENSURE ENLIVE PO LIQD
237.0000 mL | Freq: Two times a day (BID) | ORAL | Status: DC
Start: 2015-03-13 — End: 2015-03-15
  Administered 2015-03-13 – 2015-03-14 (×3): 237 mL via ORAL

## 2015-03-13 MED ORDER — FENTANYL CITRATE (PF) 100 MCG/2ML IJ SOLN
12.5000 ug | Freq: Once | INTRAMUSCULAR | Status: AC
  Administered 2015-03-13: 12.5 ug via INTRAVENOUS

## 2015-03-13 MED ORDER — OXYCODONE-ACETAMINOPHEN 5-325 MG PO TABS
0.5000 | ORAL_TABLET | ORAL | Status: DC | PRN
  Administered 2015-03-13 – 2015-03-15 (×4): 1 via ORAL
  Filled 2015-03-13 (×6): qty 1

## 2015-03-13 MED ORDER — FENTANYL CITRATE (PF) 100 MCG/2ML IJ SOLN
INTRAMUSCULAR | Status: AC
  Filled 2015-03-13: qty 2

## 2015-03-13 MED ORDER — ALBUTEROL SULFATE (2.5 MG/3ML) 0.083% IN NEBU
3.0000 mL | INHALATION_SOLUTION | Freq: Four times a day (QID) | RESPIRATORY_TRACT | Status: DC | PRN
Start: 2015-03-13 — End: 2015-03-15

## 2015-03-13 MED ORDER — POLYETHYLENE GLYCOL 3350 17 G PO PACK
17.0000 g | PACK | Freq: Every day | ORAL | Status: DC
  Administered 2015-03-14: 17 g via ORAL
  Filled 2015-03-13 (×2): qty 1

## 2015-03-13 MED ORDER — PANTOPRAZOLE SODIUM 40 MG PO TBEC
40.0000 mg | DELAYED_RELEASE_TABLET | Freq: Every day | ORAL | Status: DC
  Administered 2015-03-14: 40 mg via ORAL
  Filled 2015-03-13 (×2): qty 1

## 2015-03-13 MED ORDER — ACETAMINOPHEN 500 MG PO TABS
500.0000 mg | ORAL_TABLET | Freq: Once | ORAL | Status: AC
  Administered 2015-03-13: 500 mg via ORAL
  Filled 2015-03-13: qty 1

## 2015-03-13 NOTE — ED Provider Notes (Signed)
CSN: [id number]     Arrival date & time 03/13/15  0008 History  By signing my name below, I, [name], attest that this documentation has been prepared under the direction and in the presence of Martha Porter, MD at [place]. Electronically Signed: Helane [name], ED Scribe. 03/13/2015. 12:59 AM.    Chief Complaint  Patient presents with  . Chest Pain   The history is provided by the patient. No language interpreter was used.   HPI Comments: [name] is a 81 y.o. female former smoker with a PMHx of cancer, CHF, and hypotension brought by ambulance to the Emergency Department complaining of burning, pressure-like, intermittent  chest pain onset a few hours ago. Pt notes her pain has mostly resolved now and has moved down to the lepigastric area. She reports associated SOB and nausea. Per caregiver, pt had a flu-shot today. She notes pt was spitting up phlegm and complaining of congestion before visiting her PCP. The phlegm increased and pt complained of dry throat and feeling as though her throat was closing up. Caregiver notes pt has a PMHx of H. Pylori (2015) and is concerned she may have the same again. She reports pt having associated subjective fever, chills, loss of appetite, difficulty swallowing, bloating, abdominal pain, constipation, and difficulty urinating (not today). Per caregiver, pt is ambulatory and uses a walker. She also notes pt having a PMHx of CHF and PNA. She states pt was seen for abdominal pain in the ED and diagnosed with a "ileus or lazy bowel." Pt denies vomiting and diaphoresis.   Dr Merlyn Albert  Past Medical History  Diagnosis Date  . Cancer ([name])     uterine surg only  . Arthritis   . Kidney stone   . Pancreatic atrophy ([name]) 12/31/2012  . Colitis 12/31/2012    presumed C.Diff. patient declined colonoscopy  . Pneumonia   . Osteoarthritis   . Muscle weakness (generalized)   . CHF (congestive heart failure) ([place])   . Anemia   . Hypotension    Past Surgical  History  Procedure Laterality Date  . Joint replacement    . Cholecystectomy    . Right hip surgery    . Left knee surgery and revision    . Esophagogastroduodenoscopy N/A 03/03/2013    RMR: Significant gastric ulcer without bleeding stigmata requiring no endoscopic intervention today. Noncritical Schatzki's ring. Small hiatal hernia  . Esophagogastroduodenoscopy N/A 08/24/2013    Dr. Gala [name]: Hiatal hernia.  Gastric mucosal scar formation at site of previously noted gastric ulcerations  . Abdominal hysterectomy     Family History  Problem Relation Age of Onset  . Cancer Mother    Social History  Substance Use Topics  . Smoking status: Former Smoker    Types: Cigarettes    Quit date: 04/25/1951  . Smokeless tobacco: Never Used  . Alcohol Use: No     Comment: 1 glass of wine a day- former   Lives at home Has caregivers except at night  OB History    Gravida Para Term Preterm AB TAB SAB Ectopic Multiple Living   3 2 2  1  1         Review of Systems  Constitutional: Positive for fever, chills and appetite change. Negative for diaphoresis.  HENT: Positive for congestion and trouble swallowing.   Respiratory: Positive for shortness of breath.   Cardiovascular: Positive for chest pain.  Gastrointestinal: Positive for nausea, abdominal pain, constipation and abdominal distention. Negative for vomiting.  Genitourinary:  Positive for difficulty urinating.  All other systems reviewed and are negative.   Allergies  Sulfa antibiotics and Aspirin  Home Medications   Prior to Admission medications   Medication Sig Start Date End Date Taking? Authorizing Provider  acetaminophen (TYLENOL) 500 MG tablet Take 500 mg by mouth every 6 (six) hours as needed for pain.   Yes Historical Provider, MD  albuterol (PROAIR HFA) 108 (90 BASE) MCG/ACT inhaler Inhale 1-2 puffs into the lungs every 6 (six) hours as needed for wheezing or shortness of breath.   Yes Historical Provider, MD  docusate  sodium (COLACE) 100 MG capsule Take 100 mg by mouth 2 (two) times daily.   Yes Historical Provider, MD  Multiple Vitamin (MULTIVITAMIN WITH MINERALS) TABS tablet Take 1 tablet by mouth daily.   Yes Historical Provider, MD  ondansetron (ZOFRAN) 4 MG tablet Take 4 mg by mouth every 6 (six) hours as needed for nausea or vomiting.   Yes Historical Provider, MD  oxyCODONE-acetaminophen (PERCOCET/ROXICET) 5-325 MG per tablet Take 1/2 or 2 half tablets by mouth every 4 hours as needed for moderate or severe pain Patient taking differently: Take 0.5-1 tablets by mouth every 4 (four) hours as needed for moderate pain or severe pain.  09/04/14  Yes Martha L Reed, DO  polyethylene glycol (MIRALAX / GLYCOLAX) packet Take 17 g by mouth daily. 10/28/14  Yes Martha Dike, MD  torsemide (DEMADEX) 10 MG tablet Take 5 mg by mouth as needed.    Yes Historical Provider, MD   BP 105/49 mmHg  Pulse 87  Temp(Src) 100.1 F (37.8 C) (Oral)  Resp 20  Wt 90 lb (40.824 kg)  SpO2 98%  Vital signs normal   Physical Exam  Constitutional: She is oriented to person, place, and time.  Non-toxic appearance. She does not appear ill. No distress.  Very frail elderly female  HENT:  Head: Normocephalic and atraumatic.  Right Ear: External ear normal.  Left Ear: External ear normal.  Nose: Nose normal. No mucosal edema or rhinorrhea.  Mouth/Throat: Mucous membranes are normal. No dental abscesses or uvula swelling.  Dry mucous membranes   Eyes: Conjunctivae and EOM are normal. Pupils are equal, round, and reactive to light.  Neck: Normal range of motion and full passive range of motion without pain. Neck supple.  Cardiovascular: Normal rate, regular rhythm and normal heart sounds.  Exam reveals no gallop and no friction rub.   No murmur heard. Pulmonary/Chest: Effort normal and breath sounds normal. No respiratory distress. She has no wheezes. She has no rhonchi. She has no rales. She exhibits no tenderness and no  crepitus.  Abdominal: Soft. Normal appearance and bowel sounds are normal. She exhibits no distension. There is no tenderness. There is no rebound and no guarding.  Patient has long mid-pitched prolonged continuous bowel sounds  Musculoskeletal: Normal range of motion. She exhibits no edema or tenderness.  Moves all extremities well. kyphosis  Neurological: She is alert and oriented to person, place, and time. She has normal strength. No cranial nerve deficit.  Skin: Skin is warm, dry and intact. No rash noted. No erythema. No pallor.  Psychiatric: She has a normal mood and affect. Her speech is normal and behavior is normal. Her mood appears not anxious.  Nursing note and vitals reviewed.   ED Course  Procedures   Medications  azithromycin (ZITHROMAX) 500 mg in dextrose 5 % 250 mL IVPB (500 mg Intravenous New Bag/Given 03/13/15 0325)  fentaNYL (SUBLIMAZE) injection 12.5 mcg (12.5  mcg Intravenous Given 03/13/15 0106)  fentaNYL (SUBLIMAZE) injection 12.5 mcg (12.5 mcg Intravenous Given 03/13/15 0228)  cefTRIAXone (ROCEPHIN) 1 g in dextrose 5 % 50 mL IVPB (0 g Intravenous Stopped 03/13/15 0327)  acetaminophen (TYLENOL) tablet 500 mg (500 mg Oral Given 03/13/15 0247)    DIAGNOSTIC STUDIES: Oxygen Saturation is 95% on RA, adequate by my interpretation.    COORDINATION OF CARE: 12:57 AM - Discussed EKG results. Discussed plans to order diagnostic studies and imaging. Pt advised of plan for treatment and pt agrees.  After reviewing patient's lab work and x-ray she was started on community-acquired pneumonia antibiotics. Her test results were given to the patient and her caregiver. We discussed admission and they are agreeable.  02:54 Dr Shanon Brow, admit to obs, med-surg, request flu test.   Labs Review Results for orders placed or performed during the hospital encounter of 03/13/15  Culture, blood (routine x 2)  Result Value Ref Range   Specimen Description BLOOD RIGHT ANTECUBITAL    Special  Requests BOTTLES DRAWN AEROBIC AND ANAEROBIC 8CC EACH    Culture PENDING    Report Status PENDING   Culture, blood (routine x 2)  Result Value Ref Range   Specimen Description BLOOD LEFT FOREARM    Special Requests      BOTTLES DRAWN AEROBIC AND ANAEROBIC AEB 4CC ANA 2CC   Culture PENDING    Report Status PENDING   CBC  Result Value Ref Range   WBC 11.5 (H) 4.0 - 10.5 K/uL   RBC 3.76 (L) 3.87 - 5.11 MIL/uL   Hemoglobin 11.9 (L) 12.0 - 15.0 g/dL   HCT 35.9 (L) 36.0 - 46.0 %   MCV 95.5 78.0 - 100.0 fL   MCH 31.6 26.0 - 34.0 pg   MCHC 33.1 30.0 - 36.0 g/dL   RDW 14.9 11.5 - 15.5 %   Platelets 211 150 - 400 K/uL  Troponin I  Result Value Ref Range   Troponin I <0.03 <0.031 ng/mL  Comprehensive metabolic panel  Result Value Ref Range   Sodium 141 135 - 145 mmol/L   Potassium 3.2 (L) 3.5 - 5.1 mmol/L   Chloride 105 101 - 111 mmol/L   CO2 25 22 - 32 mmol/L   Glucose, Bld 98 65 - 99 mg/dL   BUN 12 6 - 20 mg/dL   Creatinine, Ser 0.61 0.44 - 1.00 mg/dL   Calcium 9.3 8.9 - 10.3 mg/dL   Total Protein 6.2 (L) 6.5 - 8.1 g/dL   Albumin 3.8 3.5 - 5.0 g/dL   AST 22 15 - 41 U/L   ALT 9 (L) 14 - 54 U/L   Alkaline Phosphatase 50 38 - 126 U/L   Total Bilirubin 0.8 0.3 - 1.2 mg/dL   GFR calc non Af Amer >60 >60 mL/min   GFR calc Af Amer >60 >60 mL/min   Anion gap 11 5 - 15  Lipase, blood  Result Value Ref Range   Lipase 32 11 - 51 U/L  Urinalysis, Routine w reflex microscopic (not at [facility])  Result Value Ref Range   Color, Urine YELLOW YELLOW   APPearance CLEAR CLEAR   Specific Gravity, Urine 1.010 1.005 - 1.030   pH 5.5 5.0 - 8.0   Glucose, UA NEGATIVE NEGATIVE mg/dL   Hgb urine dipstick MODERATE (A) NEGATIVE   Bilirubin Urine NEGATIVE NEGATIVE   Ketones, ur TRACE (A) NEGATIVE mg/dL   Protein, ur NEGATIVE NEGATIVE mg/dL   Urobilinogen, UA 0.2 0.0 - 1.0 mg/dL  Nitrite NEGATIVE NEGATIVE   Leukocytes, UA NEGATIVE NEGATIVE  Urine microscopic-add on  Result Value Ref Range    Squamous Epithelial / LPF FEW (A) RARE   WBC, UA 0-2 <3 WBC/hpf   RBC / HPF 0-2 <3 RBC/hpf   Bacteria, UA MANY (A) RARE  Lactic acid, plasma  Result Value Ref Range   Lactic Acid, Venous 1.5 0.5 - 2.0 mmol/L   Laboratory interpretation all normal except mild leukocytosis, hematuria and catheterized specimen, mild anemia, mild hypokalemia     Imaging Review Dg Chest Portable 1 View  03/13/2015  CLINICAL DATA:  Burning chest pain for several hours tonight. EXAM: PORTABLE CHEST 1 VIEW COMPARISON:  10/27/2014 FINDINGS: There is marked thoracic scoliosis. There is unchanged cardiomegaly. No confluent consolidation is evident. There is mild patchy opacity in the lateral left base which may be atelectatic. This has been present on a few of the recent prior studies. No large effusion is evident. No pneumothorax is evident. IMPRESSION: Mild patchy opacity in the lateral left base, possibly atelectatic. No large effusion. No segmental consolidation. Electronically Signed   By: Andreas Newport M.D.   On: 03/13/2015 01:12   Dg Abd Portable 2v  03/13/2015  CLINICAL DATA:  Abdominal pain and bloating EXAM: PORTABLE ABDOMEN - 2 VIEW COMPARISON:  None. FINDINGS: Scattered large and small bowel gas is noted. Postsurgical changes are seen. No free air is noted. A right renal calculus is noted. Scoliosis of thoracolumbar spine is noted. IMPRESSION: Right renal calculus. No acute abnormality is noted. Electronically Signed   By: Inez Catalina M.D.   On: 03/13/2015 01:26   I have personally reviewed and evaluated these images and lab results as part of my medical decision-making.   EKG Interpretation   Date/Time:  Tuesday March 13 2015 00:15:39 EDT Ventricular Rate:  91 PR Interval:  191 QRS Duration: 82 QT Interval:  348 QTC Calculation: 428 R Axis:   -66 Text Interpretation:  Sinus rhythm Left anterior fascicular block Probable  anteroseptal infarct, old No significant change since last tracing 27 Oct 2014 Confirmed by Brookelynn Hamor  MD-I, Doristine Shehan ([id number]) on 03/13/2015 12:38:32 AM      MDM   Final diagnoses:  Other specified fever  Chest pain, unspecified chest pain type  CAP (community acquired pneumonia)   Plan admission  Martha Porter, MD, FACEP   I personally performed the services described in this documentation, which was scribed in my presence. The recorded information has been reviewed and considered.  Martha Porter, MD, Barbette Or, MD 03/13/15 [phone]

## 2015-03-13 NOTE — Evaluation (Signed)
Physical Therapy Evaluation Patient Details Name: [name] MRN: [id number] DOB: 1918-07-19 Today's Date: 03/13/2015   History of Present Illness  81 yo female h/o couple days of congestion and cough lives at home alone during the day, has someone stay with her at night (or it may be other way around) normally ambulates with a walker started having sscp this evening that had resolved by the time she got to the ED. Pt says she hurts all over, her arms. Her chest not anymore, but her lower abdomen feels like she needs to urinate. Her temp on arrival was 100.4. Pt referred for obs for elderly status with likely early pna.  Clinical Impression   Pt was seen for evaluation and found to be alert, oriented and very cooperative.  She states that she has been managing well at home with CG assist up until this recent illness.  She is found to have a severe kyphoscoliosis and generalized weakness/deconditioning.  She was able to transfer out of bed and ambulate in the room with a walker for 60' with good stability.  We will plan to see her while she is here to maximize her strength and endurance.    Follow Up Recommendations No PT follow up (this recommendation may change if she needs to stay for any extended time)    Equipment Recommendations  Rolling walker with 5" wheels - needs a junior walker (she is only 4'6") and her walker is a standard rolling walker.. She doesn't know how old it is or where it was obtained.   Recommendations for Other Services   none    Precautions / Restrictions Precautions Precautions: Fall Restrictions Weight Bearing Restrictions: No      Mobility  Bed Mobility Overal bed mobility: Needs Assistance Bed Mobility: Supine to Sit     Supine to sit: Mod assist;HOB elevated        Transfers Overall transfer level: Needs assistance Equipment used: Rolling walker (2 wheeled) Transfers: Sit to/from Stand Sit to Stand: Supervision             Ambulation/Gait Ambulation/Gait assistance: Supervision Ambulation Distance (Feet): 60 Feet Assistive device: Rolling walker (2 wheeled) Gait Pattern/deviations: Trunk flexed;WFL(Within Functional Limits) Gait velocity: appropriate for situation      Stairs            Wheelchair Mobility    Modified Rankin (Stroke Patients Only)       Balance Overall balance assessment:  (sitting balance is WNL, standing balance not tested)                                           Pertinent Vitals/Pain Pain Assessment: No/denies pain (does have chronic shoulder pain at times)    Home Living Family/patient expects to be discharged to:: Private residence Living Arrangements: Alone Available Help at Discharge: Home health;Available PRN/intermittently (CG in the home daytime and evening 7 days/week...alone at night) Type of Home: House Home Access: Level entry     Home Layout: One level Home Equipment: Walker - 4 wheels;Walker - 2 wheels;Bedside commode      Prior Function Level of Independence: Needs assistance   Gait / Transfers Assistance Needed: ambulates with a walker and SBA short distances in the home, sometimes needs assist in and out of bed  ADL's / Homemaking Assistance Needed: assist with ADLs and all household tasks  Hand Dominance        Extremity/Trunk Assessment   Upper Extremity Assessment: Generalized weakness (minimal function of shoulders due to severe weakness and limitation of ROM)           Lower Extremity Assessment:  (appears to have bilateral hip flexion contractures)      Cervical / Trunk Assessment: Kyphotic;Other exceptions  Communication   Communication: HOH  Cognition Arousal/Alertness: Awake/alert Behavior During Therapy: WFL for tasks assessed/performed Overall Cognitive Status: Within Functional Limits for tasks assessed                      General Comments      Exercises         Assessment/Plan    PT Assessment Patient needs continued PT services  PT Diagnosis Difficulty walking;Generalized weakness   PT Problem List Decreased strength;Decreased activity tolerance;Decreased mobility  PT Treatment Interventions Gait training;Functional mobility training;Therapeutic exercise   PT Goals (Current goals can be found in the Care Plan section) Acute Rehab PT Goals Patient Stated Goal: none stated PT Goal Formulation: With patient Time For Goal Achievement: 03/27/15 Potential to Achieve Goals: Fair    Frequency Min 3X/week   Barriers to discharge   none    Co-evaluation               End of Session Equipment Utilized During Treatment: Gait belt Activity Tolerance: Patient tolerated treatment well Patient left: in chair;with call bell/phone within reach;with chair alarm set;with family/visitor present      Functional Assessment Tool Used: clinical judgement Functional Limitation: Mobility: Walking and moving around Mobility: Walking and Moving Around Current Status [phone]): At least 40 percent but less than 60 percent impaired, limited or restricted Mobility: Walking and Moving Around Goal Status [phone]): At least 20 percent but less than 40 percent impaired, limited or restricted    Time: 1345-1425 PT Time Calculation (min) (ACUTE ONLY): 40 min   Charges:   PT Evaluation $Initial PT Evaluation Tier I: 1 Procedure     PT G Codes:   PT G-Codes **NOT FOR INPATIENT CLASS** Functional Assessment Tool Used: clinical judgement Functional Limitation: Mobility: Walking and moving around Mobility: Walking and Moving Around Current Status ([id number]): At least 40 percent but less than 60 percent impaired, limited or restricted Mobility: Walking and Moving Around Goal Status [phone]): At least 20 percent but less than 40 percent impaired, limited or restricted    Sable Feil  PT 03/13/2015, 2:40 PM [phone]

## 2015-03-13 NOTE — Progress Notes (Signed)
Refused to have foley catheter placed after unsuccessful attempt x 1, Dr. Shanon Brow notified.

## 2015-03-13 NOTE — ED Notes (Signed)
Pt complaining of a burning pain to her chest. Pt felt fine when she got her flu shot today.

## 2015-03-13 NOTE — Progress Notes (Signed)
PROGRESS NOTE  [name] EXB:[id number] DOB: [birth year] DOA: 03/13/2015 PCP: Wende Neighbors, MD  Summary: 49 yof with PMH of hypotension presented with complaints of congestion and cough. During initial evaluation in the ED she was found to have temp 100.4 and mild leukocytosis. She has been admitted for possible early pneumonia.   Assessment/Plan: 1. Acute hypoxic respiratory failure, secondary to CAP. Stable. 2. CAP. On IV rocephin and azithromycin. Quick flu negative. BC and UC are pending.  3. Atypical chest pain. ACS ruled out with negative cardiac markers upon admission. Likely related to CAP given location left rib pain. 4. Epigastric pain, h/o H. pylori. Monitor clinically, abdominal XR unremarkable. Lipase and LFTs unremarkable. 5. Chronic hypotension. Stable 6. Anemia of chornic disease.  7. Chronic diastolic CHF. Appears compensated. 8. Protein-calorie malnutrition, severe. 9. PUD.    Appears stable, plan to continue empiric abx. Wean oxygen as tolerated.  Code Status: Full  DVT prophylaxis: SCD Family Communication: Discussed with patient who understands and has no concerns at this time. Disposition Plan: Discharge within 1-2 days.   Murray Hodgkins, MD  [facility]  Pager [phone] If 7PM-7AM, please contact night-coverage at [email or web address], password [facility] 03/13/2015, 7:17 AM    Consultants:    Procedures:    Antibiotics:  Azithromycin 11/1>>   Ceftriaxone 11/1>>   HPI/Subjective: Feels better then yesterday. Breathing is improving and denies coughing, nausea, or vomiting. Denies pain while breathing, acid reflux or heart burn. Some left rib pain.  Objective: Filed Vitals:   03/13/15 0234 03/13/15 0300 03/13/15 0551 03/13/15 0611  BP:  91/51  92/52  Pulse:  73  68  Temp: 100.1 F (37.8 C)   98.6 F (37 C)  TempSrc: Oral   Oral  Resp:  19    Height:   4\' 6"  (1.372 m)   Weight:   36.832 kg (81 lb 3.2 oz)   SpO2:  95%  96%   No intake or  output data in the 24 hours ending 03/13/15 0717   Filed Weights   03/13/15 0014 03/13/15 0551  Weight: 40.824 kg (90 lb) 36.832 kg (81 lb 3.2 oz)    Exam:  Afebrile, mild hypotension, not hypoxic  General:  Appears calm and comfortable. Lying in bed.  Eyes: PERRL, normal lids, irises & conjunctiva ENT: grossly normal hearing, lips & tongue Cardiovascular: RRR. Soft 1/6 systolic murmur, no rub or gallop. No LE edema.  Respiratory: CTA bilaterally, no w/r/r. Normal respiratory effort. Abdomen: soft, generalized tenderness, non distended  Skin: no rash or induration seen on limited exam Musculoskeletal: grossly normal tone BUE/BLE Psychiatric: grossly normal mood and affect, speech fluent and appropriate Neurologic: grossly non-focal.  New data reviewed:  Troponin .03  WBC 14.8  Hgb 10.0  BMP unremarkable.   Influenza screen negative   Pertinent data since admission:    Pending data:    Scheduled Meds: . [START ON 03/14/2015] azithromycin  500 mg Intravenous Q24H  . [START ON 03/14/2015] cefTRIAXone (ROCEPHIN)  IV  1 g Intravenous Q24H  . feeding supplement (ENSURE ENLIVE)  237 mL Oral BID BM   Continuous Infusions: . sodium chloride 50 mL/hr at 03/13/15 0547    Principal Problem:   CAP (community acquired pneumonia) Active Problems:   PUD (peptic ulcer disease)   Chronic diastolic CHF (congestive heart failure) ([facility])   Protein-calorie malnutrition, severe ([facility])   Chest pain   Time spent: 20 minutes   By signing my name below, I, Rennis Harding attest that this  documentation has been prepared under the direction and in the presence of Murray Hodgkins, MD Electronically signed: Rennis Harding  03/13/2015 12:31pm   I personally performed the services described in this documentation. All medical record entries made by the scribe were at my direction. I have reviewed the chart and agree that the record reflects my personal performance and is accurate and  complete. Murray Hodgkins, MD

## 2015-03-13 NOTE — Progress Notes (Signed)
Initial Nutrition Assessment  DOCUMENTATION CODES:  Severe malnutrition in context of acute illness/injury  INTERVENTION:  Magic cup BID with meals, each supplement provides 290 kcal and 9 grams of protein  Recommend continuing MVI (she reports just recently taking one)  Obtained food preferences  NUTRITION DIAGNOSIS:  Inadequate oral intake related to Early satiety as evidenced by an estimated energy intake that met < or equal to 50% of needs for > or equal to 5 days and severe muscle/fat depletion  GOAL:  Patient will meet greater than or equal to 90% of their needs  MONITOR:  PO intake, Supplement acceptance, Labs, Weight trends  REASON FOR ASSESSMENT:  Malnutrition Screening Tool    ASSESSMENT:  81 y/o female PMHx PNA, Cancer, Colitis, CHF, Anemia who presnts with chest pain with associated SOB and nausea. Caregiver also reports subjective chills, fever, loss of appetite, difficulty swallowing, bloating, abdominal pain, constipation, and difficulty urinating. Found to have R Renal calculus and mild patchy opacity in lung  On RD arrival, pt reports significant pain in side and radiates to back. She was still able to complete interview.   She states that she has noticed early and increased satiety. She says she can "only eat so much" before becoming full. She has had  Bloating/gas, nausea, constipation, and diarrhea. no vomiting.  She also says her arthritis is "the worst it has ever been", but doesn't think this affected her eating much.   For the past week, she reports not eating meals, rather just bites throughout the day. She supplemented her poor oral intake with 1 Ensure each day, but states she could never finish them. She followed a low salt diet at home. She "just recently" started taking a mvi.   She does not know what her new normal weight is. Per documentation she has lost  This last week, but she regained some.   NFPE: Severe upper body fat/muscle wasting.   The  only thing she really wants to eat/drink is hot tea. Agreeable to [employer].  Diet Order:  Diet Heart Room service appropriate?: Yes; Fluid consistency:: Thin  Skin:  Reviewed, no issues  Last BM:  Unknown  Height:  Ht Readings from Last 1 Encounters:  03/13/15 _0  (1.372 m)   Weight:  Wt Readings from Last 1 Encounters:  03/13/15 81 lb 3.2 oz (36.832 kg)   Wt Readings from Last 10 Encounters:  03/13/15 81 lb 3.2 oz (36.832 kg)  10/28/14 89 lb 11.2 oz (40.688 kg)  09/18/14 91 lb (41.277 kg)  08/15/14 92 lb 2.4 oz (41.8 kg)  07/27/14 93 lb (42.185 kg)  10/05/13 100 lb (45.36 kg)  08/08/13 93 lb (42.185 kg)  06/03/13 99 lb 12.8 oz (45.269 kg)  03/02/13 93 lb 9.6 oz (42.457 kg)  02/23/13 97 lb (43.999 kg)   Ideal Body Weight:  40.91 kg  BMI:  Body mass index is 19.57 kg/(m^2).  Estimated Nutritional Needs:  Kcal:  1100-1300 kcals (30-35 kcal/kg) Protein:  49-57 g (1.2-1.4 g/kg IBW) Fluid:  1.2 liters  EDUCATION NEEDS:  No education needs identified at this time  Burtis Junes RD, LDN Nutrition Pager: [phone] 03/13/2015 11:56 AM

## 2015-03-13 NOTE — Progress Notes (Signed)
Patient BP is 83/39. Contacted midlevel. Received order for 500 cc NS bolus to be given over 1 hour. Will give and continue to monitor closely.

## 2015-03-13 NOTE — H&P (Signed)
PCP:   Wende Neighbors, MD   Chief Complaint:  Chest pain  HPI: 81 yo female h/o couple days of congestion and cough lives at home alone during the day, has someone stay with her at night (or it may be other way around) normally ambulates with a walker started having sscp this evening that had resolved by the time she got to the ED.  Pt says she hurts all over, her arms.  Her chest not anymore, but her lower abdomen feels like she needs to urinate.  Her temp on arrival was 100.4.  Pt referred for obs for elderly status with likely early pna.  Review of Systems:  Positive and negative as per HPI otherwise all other systems are negative  Past Medical History: Past Medical History  Diagnosis Date  . Cancer ([place])     uterine surg only  . Arthritis   . Kidney stone   . Pancreatic atrophy ([place]) 12/31/2012  . Colitis 12/31/2012    presumed C.Diff. patient declined colonoscopy  . Pneumonia   . Osteoarthritis   . Muscle weakness (generalized)   . CHF (congestive heart failure) ([place])   . Anemia   . Hypotension    Past Surgical History  Procedure Laterality Date  . Joint replacement    . Cholecystectomy    . Right hip surgery    . Left knee surgery and revision    . Esophagogastroduodenoscopy N/A 03/03/2013    RMR: Significant gastric ulcer without bleeding stigmata requiring no endoscopic intervention today. Noncritical Schatzki's ring. Small hiatal hernia  . Esophagogastroduodenoscopy N/A 08/24/2013    Dr. [name]: Hiatal hernia.  Gastric mucosal scar formation at site of previously noted gastric ulcerations  . Abdominal hysterectomy      Medications: Prior to Admission medications   Medication Sig Start Date End Date Taking? Authorizing Provider  acetaminophen (TYLENOL) 500 MG tablet Take 500 mg by mouth every 6 (six) hours as needed for pain.   Yes Historical Provider, MD  albuterol (PROAIR HFA) 108 (90 BASE) MCG/ACT inhaler Inhale 1-2 puffs into the lungs every 6 (six) hours as needed  for wheezing or shortness of breath.   Yes Historical Provider, MD  docusate sodium (COLACE) 100 MG capsule Take 100 mg by mouth 2 (two) times daily.   Yes Historical Provider, MD  Multiple Vitamin (MULTIVITAMIN WITH MINERALS) TABS tablet Take 1 tablet by mouth daily.   Yes Historical Provider, MD  ondansetron (ZOFRAN) 4 MG tablet Take 4 mg by mouth every 6 (six) hours as needed for nausea or vomiting.   Yes Historical Provider, MD  oxyCODONE-acetaminophen (PERCOCET/ROXICET) 5-325 MG per tablet Take 1/2 or 2 half tablets by mouth every 4 hours as needed for moderate or severe pain Patient taking differently: Take 0.5-1 tablets by mouth every 4 (four) hours as needed for moderate pain or severe pain.  09/04/14  Yes [name], DO  polyethylene glycol (MIRALAX / GLYCOLAX) packet Take 17 g by mouth daily. 10/28/14  Yes [name], MD  torsemide (DEMADEX) 10 MG tablet Take 5 mg by mouth as needed.    Yes Historical Provider, MD    Allergies:   Allergies  Allergen Reactions  . Sulfa Antibiotics   . Aspirin Other (See Comments)    Makes stomach burn    Social History:  reports that she quit smoking about 63 years ago. Her smoking use included Cigarettes. She has never used smokeless tobacco. She reports that she does not drink alcohol or use illicit  drugs.  Family History: Family History  Problem Relation Age of Onset  . Cancer Mother     Physical Exam: Filed Vitals:   03/13/15 0130 03/13/15 0200 03/13/15 0234 03/13/15 0300  BP: 100/61 105/49  91/51  Pulse: 82 87  73  Temp:   100.1 F (37.8 C)   TempSrc:   Oral   Resp: 21 20  19   Weight:      SpO2: 98% 98%  95%   General appearance: alert, cooperative and mild distress cachectic mildly tachypneic Head: Normocephalic, without obvious abnormality, atraumatic Eyes: negative Nose: Nares normal. Septum midline. Mucosa normal. No drainage or sinus tenderness. Neck: no JVD and supple, symmetrical, trachea midline Lungs: clear to  auscultation bilaterally Heart: regular rate and rhythm, S1, S2 normal, no murmur, click, rub or gallop Abdomen: soft, non-tender; bowel sounds normal; no masses,  no organomegaly Extremities: extremities normal, atraumatic, no cyanosis or edema Pulses: 2+ and symmetric Skin: Skin color, texture, turgor normal. No rashes or lesions Neurologic: Grossly normal   Labs on Admission:   Recent Labs  03/13/15 0025  NA 141  K 3.2*  CL 105  CO2 25  GLUCOSE 98  BUN 12  CREATININE 0.61  CALCIUM 9.3    Recent Labs  03/13/15 0025  AST 22  ALT 9*  ALKPHOS 50  BILITOT 0.8  PROT 6.2*  ALBUMIN 3.8    Recent Labs  03/13/15 0025  LIPASE 32    Recent Labs  03/13/15 0025  WBC 11.5*  HGB 11.9*  HCT 35.9*  MCV 95.5  PLT 211    Recent Labs  03/13/15 0025  TROPONINI <0.03   Radiological Exams on Admission: Dg Chest Portable 1 View  03/13/2015  CLINICAL DATA:  Burning chest pain for several hours tonight. EXAM: PORTABLE CHEST 1 VIEW COMPARISON:  10/27/2014 FINDINGS: There is marked thoracic scoliosis. There is unchanged cardiomegaly. No confluent consolidation is evident. There is mild patchy opacity in the lateral left base which may be atelectatic. This has been present on a few of the recent prior studies. No large effusion is evident. No pneumothorax is evident. IMPRESSION: Mild patchy opacity in the lateral left base, possibly atelectatic. No large effusion. No segmental consolidation. Electronically Signed   By: Andreas Newport M.D.   On: 03/13/2015 01:12   Dg Abd Portable 2v  03/13/2015  CLINICAL DATA:  Abdominal pain and bloating EXAM: PORTABLE ABDOMEN - 2 VIEW COMPARISON:  None. FINDINGS: Scattered large and small bowel gas is noted. Postsurgical changes are seen. No free air is noted. A right renal calculus is noted. Scoliosis of thoracolumbar spine is noted. IMPRESSION: Right renal calculus. No acute abnormality is noted. Electronically Signed   By: Inez Catalina M.D.    On: 03/13/2015 01:26    Assessment/Plan  81 yo female with brief episode of chest pain with likely early pna  Principal Problem:   CAP (community acquired pneumonia)-  Iv rocephin and azithromycing.  Blood and sputum cx pending.  Quick flu neg.  Pt certainly high risk for complications. However at this time her oxygen sats and vitals are normal.  Lactic acid nml.    Active Problems:   PUD (peptic ulcer disease)- noted   Chronic diastolic CHF (congestive heart failure) ([place])- compensated at this time, stable   Protein-calorie malnutrition, severe ([place])- noted   Chest pain- check trop one more time, ekg nonischemic, likely related to pulm issues  obs on medical bed.  Full code.  She says she wants  her daughter to make these decisions.    Shadana Pry A 03/13/2015, 4:29 AM

## 2015-03-14 DIAGNOSIS — E43 Unspecified severe protein-calorie malnutrition: Secondary | ICD-10-CM

## 2015-03-14 DIAGNOSIS — I5032 Chronic diastolic (congestive) heart failure: Secondary | ICD-10-CM | POA: Diagnosis not present

## 2015-03-14 DIAGNOSIS — J189 Pneumonia, unspecified organism: Secondary | ICD-10-CM | POA: Diagnosis not present

## 2015-03-14 DIAGNOSIS — K279 Peptic ulcer, site unspecified, unspecified as acute or chronic, without hemorrhage or perforation: Secondary | ICD-10-CM | POA: Diagnosis not present

## 2015-03-14 DIAGNOSIS — J9601 Acute respiratory failure with hypoxia: Secondary | ICD-10-CM

## 2015-03-14 DIAGNOSIS — R079 Chest pain, unspecified: Secondary | ICD-10-CM | POA: Diagnosis not present

## 2015-03-14 LAB — STREP PNEUMONIAE URINARY ANTIGEN: STREP PNEUMO URINARY ANTIGEN: NEGATIVE

## 2015-03-14 LAB — CBC
HEMATOCRIT: 29.4 % — AB (ref 36.0–46.0)
HEMOGLOBIN: 9.7 g/dL — AB (ref 12.0–15.0)
MCH: 31.6 pg (ref 26.0–34.0)
MCHC: 33 g/dL (ref 30.0–36.0)
MCV: 95.8 fL (ref 78.0–100.0)
Platelets: 186 10*3/uL (ref 150–400)
RBC: 3.07 MIL/uL — AB (ref 3.87–5.11)
RDW: 15.1 % (ref 11.5–15.5)
WBC: 6.3 10*3/uL (ref 4.0–10.5)

## 2015-03-14 LAB — URINE CULTURE: Culture: NO GROWTH

## 2015-03-14 MED ORDER — MAGNESIUM CITRATE PO SOLN
1.0000 | Freq: Once | ORAL | Status: AC
  Administered 2015-03-14: 1 via ORAL
  Filled 2015-03-14: qty 296

## 2015-03-14 NOTE — Care Management Obs Status (Signed)
[facility] NOTIFICATION   Patient Details  Name: MARITA BURNSED MRN: [id number] Date of Birth: 11/29/1918   Medicare Observation Status Notification Given:  Yes    Joylene Draft, RN 03/14/2015, 11:05 AM

## 2015-03-14 NOTE — Progress Notes (Signed)
[facility] PROGRESS NOTE  [name] SJG:[id number] DOB: [birth year] DOA: 03/13/2015 PCP: Wende Neighbors, MD  Assessment/Plan: 1. Acute hypoxic respiratory failure, secondary to CAP. Currently stable on RA.  2. CAP. On IV rocephin and azithromycin. Quick flu negative. BC and UC are pending. Afebrile, WBC wnl. Will likely transition to oral abx tomorrow. 3. Atypical chest pain. ACS ruled out with negative cardiac markers upon admission. Likely related to CAP given location of left rib pain. 4. Epigastric pain, h/o H. pylori. Monitor clinically, abdominal XR unremarkable. Lipase and LFTs unremarkable. Continue PPI 5. Acute on chronic hypotension.  Patient received fluid bolus overnight, BP are mildly improving today. Will continue to follow.  6. Anemia of chronic disease, stable. Continue to monitor.  7. Chronic diastolic CHF. Appears compensated. 8. Protein-calorie malnutrition, severe. Nutrition following.  9. PUD. Continue PPI   Code Status: Full  DVT prophylaxis: SCD Family Communication: Discussed with patient who understands and has no concerns at this time. Disposition Plan: Possibly discharge home tomorrow.    Consultants:  PT- no follow up    Procedures:    Antibiotics:  Azithromycin 11/1>>   Ceftriaxone 11/1>>  HPI/Subjective: Worsening arthritis pain in her shoulders, arms, and hands. Feels SOB but denies any CP, nausea, or vomiting.   Objective: Filed Vitals:   03/14/15 0519  BP: 90/49  Pulse: 75  Temp: 98.5 F (36.9 C)  Resp: 20    Intake/Output Summary (Last 24 hours) at 03/14/15 0741 Last data filed at 03/14/15 0525  Gross per 24 hour  Intake    180 ml  Output   1300 ml  Net  -1120 ml   Filed Weights   03/13/15 0014 03/13/15 0551  Weight: 40.824 kg (90 lb) 36.832 kg (81 lb 3.2 oz)    Exam:  General: appears mildly SOB and uncomfortable due to pain.  Cardiovascular: RRR, S1, S2  Respiratory: clear bilaterally, No wheezing, rales  or rhonchi Abdomen: soft, non tender, no distention , bowel sounds normal Musculoskeletal: 1+ edema BLE  Data Reviewed: Basic Metabolic Panel:  Recent Labs Lab 03/13/15 0025 03/13/15 0628  NA 141 139  K 3.2* 3.6  CL 105 106  CO2 25 27  GLUCOSE 98 105*  BUN 12 14  CREATININE 0.61 0.71  CALCIUM 9.3 8.6*   Liver Function Tests:  Recent Labs Lab 03/13/15 0025  AST 22  ALT 9*  ALKPHOS 50  BILITOT 0.8  PROT 6.2*  ALBUMIN 3.8    Recent Labs Lab 03/13/15 0025  LIPASE 32   CBC:  Recent Labs Lab 03/13/15 0025 03/13/15 0628 03/14/15 0613  WBC 11.5* 14.5* 6.3  NEUTROABS  --  13.1*  --   HGB 11.9* 10.0* 9.7*  HCT 35.9* 30.4* 29.4*  MCV 95.5 95.6 95.8  PLT 211 202 186   Cardiac Enzymes:  Recent Labs Lab 03/13/15 0025 03/13/15 0628 03/13/15 1110 03/13/15 1654  TROPONINI <0.03 <0.03 <0.03 <0.03   BNP (last 3 results)  Recent Labs  09/03/14 0600  BNP 148.0*     Recent Results (from the past 240 hour(s))  Culture, blood (routine x 2)     Status: None (Preliminary result)   Collection Time: 03/13/15  1:47 AM  Result Value Ref Range Status   Specimen Description BLOOD RIGHT ANTECUBITAL  Final   Special Requests BOTTLES DRAWN AEROBIC AND ANAEROBIC 8CC EACH  Final   Culture NO GROWTH < 12 HOURS  Final   Report Status PENDING  Incomplete  Culture, blood (routine x  2)     Status: None (Preliminary result)   Collection Time: 03/13/15  2:05 AM  Result Value Ref Range Status   Specimen Description BLOOD LEFT FOREARM  Final   Special Requests   Final    BOTTLES DRAWN AEROBIC AND ANAEROBIC AEB 4CC ANA [place]   Culture PENDING  Incomplete   Report Status PENDING  Incomplete     Studies: Dg Chest Portable 1 View  03/13/2015  CLINICAL DATA:  Burning chest pain for several hours tonight. EXAM: PORTABLE CHEST 1 VIEW COMPARISON:  10/27/2014 FINDINGS: There is marked thoracic scoliosis. There is unchanged cardiomegaly. No confluent consolidation is evident. There  is mild patchy opacity in the lateral left base which may be atelectatic. This has been present on a few of the recent prior studies. No large effusion is evident. No pneumothorax is evident. IMPRESSION: Mild patchy opacity in the lateral left base, possibly atelectatic. No large effusion. No segmental consolidation. Electronically Signed   By: Andreas Newport M.D.   On: 03/13/2015 01:12   Dg Abd Portable 2v  03/13/2015  CLINICAL DATA:  Abdominal pain and bloating EXAM: PORTABLE ABDOMEN - 2 VIEW COMPARISON:  None. FINDINGS: Scattered large and small bowel gas is noted. Postsurgical changes are seen. No free air is noted. A right renal calculus is noted. Scoliosis of thoracolumbar spine is noted. IMPRESSION: Right renal calculus. No acute abnormality is noted. Electronically Signed   By: Inez Catalina M.D.   On: 03/13/2015 01:26    Scheduled Meds: . azithromycin  500 mg Intravenous Q24H  . cefTRIAXone (ROCEPHIN)  IV  1 g Intravenous Q24H  . docusate sodium  100 mg Oral BID  . feeding supplement (ENSURE ENLIVE)  237 mL Oral BID BM  . pantoprazole  40 mg Oral Daily  . polyethylene glycol  17 g Oral Daily   Continuous Infusions:   Principal Problem:   CAP (community acquired pneumonia) Active Problems:   PUD (peptic ulcer disease)   Chronic diastolic CHF (congestive heart failure) ([facility])   Protein-calorie malnutrition, severe ([facility])   Chest pain   Pain in the chest    Time spent: 15 minutes    Jehanzeb Memon. MD  [facility] Pager [phone]. If 7PM-7AM, please contact night-coverage at [email or web address], password [facility] 03/14/2015, 7:41 AM      By signing my name below, I, Rosalie Doctor, attest that this documentation has been prepared under the direction and in the presence of [facility]. MD Electronically Signed: Rosalie Doctor, Scribe. 03/14/2015 9:36am  I, Dr. [name], personally performed the services described in this documentaiton. All medical record  entries made by the scribe were at my direction and in my presence. I have reviewed the chart and agree that the record reflects my personal performance and is accurate and complete  [name], MD, 03/14/2015 9:54 AM

## 2015-03-14 NOTE — Care Management Note (Signed)
Case Management Note  Patient Details  Name: SAFIA PANZER MRN: [id number] Date of Birth: [birth year]  Subjective/Objective:                  Pt admitted from home with pneumonia. Pt lives alone and will return home at discharge. Pt has caregivers who stay with pt during the day and pt is alone at night. Pt has a walker for home use.   Action/Plan: Pt given list of private duty agencies to arrange some caregiver assistance during the night time hours. PT recommends a junior walker at discharge. Pt wants to obtain this walker herself so she can be measured for it. Offered to arrange it for her but she would like to wait. Will continue to follow for discharge planning needs.  Expected Discharge Date:                  Expected Discharge Plan:  Home/Self Care  In-House Referral:  NA  Discharge planning Services  CM Consult  Post Acute Care Choice:  NA Choice offered to:  NA  DME Arranged:    DME Agency:     HH Arranged:    HH Agency:     Status of Service:  Completed, signed off  Medicare Important Message Given:    Date Medicare IM Given:    Medicare IM give by:    Date Additional Medicare IM Given:    Additional Medicare Important Message give by:     If discussed at [place] of Stay Meetings, dates discussed:    Additional Comments:  Joylene Draft, RN 03/14/2015, 1:12 PM

## 2015-03-15 DIAGNOSIS — J189 Pneumonia, unspecified organism: Secondary | ICD-10-CM | POA: Diagnosis not present

## 2015-03-15 DIAGNOSIS — R079 Chest pain, unspecified: Secondary | ICD-10-CM | POA: Diagnosis not present

## 2015-03-15 DIAGNOSIS — E43 Unspecified severe protein-calorie malnutrition: Secondary | ICD-10-CM | POA: Diagnosis not present

## 2015-03-15 DIAGNOSIS — I5032 Chronic diastolic (congestive) heart failure: Secondary | ICD-10-CM | POA: Diagnosis not present

## 2015-03-15 LAB — LEGIONELLA PNEUMOPHILA SEROGP 1 UR AG: L. PNEUMOPHILA SEROGP 1 UR AG: NEGATIVE

## 2015-03-15 MED ORDER — AMOXICILLIN-POT CLAVULANATE 875-125 MG PO TABS: 1.0000 | ORAL_TABLET | Freq: Two times a day (BID) | ORAL | Status: DC

## 2015-03-15 NOTE — Discharge Summary (Addendum)
Physician Discharge Summary  [name] BHA:[id number] DOB: 07-May-1919 DOA: 03/13/2015  PCP: Wende Neighbors, MD  Admit date: 03/13/2015 Discharge date: 03/15/2015  Time spent: 25 minutes  Recommendations for Outpatient Follow-up:  1. Follow up with PCP within 1-2 weeks for resolution of pneumonia.   Discharge Diagnoses:  Principal Problem:   CAP (community acquired pneumonia) Active Problems:   PUD (peptic ulcer disease)   Chronic diastolic CHF (congestive heart failure) (HCC)   Protein-calorie malnutrition, severe (HCC)   Chest pain   Pain in the chest Acute respiratory failure with hypoxia  Discharge Condition: Improved   Diet recommendation: Heart healthy   Filed Weights   03/13/15 0014 03/13/15 0551  Weight: 40.824 kg (90 lb) 36.832 kg (81 lb 3.2 oz)    History of present illness:  41 yof with PMH of hypotension presented with complaints of cough and congestion. While in the ED she was found to have temp 100.4 and WBC of 14.5. Admitted for further evaluation and monitoring.   Hospital Course:  Acute hypoxic respiratory failure, found to be secondary to CAP has resolved with improvement in pneumonia and supplemental oxygen. Upon discharge she was breathing comfortably on room air.   Community acquired pneumonia improved with abx and and IVF. Initially started on IV rocephin and azithromycin, upon discharge transitioned to oral antibiotics. Quick flu was negative. Urine and blood cultures are still pending but show no growth to date. She remained afebrile and WBC returned to WNL with improvement in pneumonia. PT has evaluated and did not recommend inpatient follow up.   1. Atypical chest pain. ACS ruled out with negative cardiac markers upon admission. Likely related to CAP given location of left rib pain. 2. Epigastric pain, h/o H. pylori. Monitored clinically, abdominal XR unremarkable. Lipase and LFTs unremarkable. Continue PPI 3. Acute on chronic hypotension. Patient  received fluid bolus 11/1, with improvement in BP. Blood pressure is currently stable.  4. Anemia of chronic disease, stable. Continue to monitor.  5. Chronic diastolic CHF. Appears compensated. 6. Protein-calorie malnutrition, severe. Nutrition followed.   7. PUD. Continued PPI  Procedures:  None  Consultations:  PT  Discharge Exam: Filed Vitals:   03/15/15 0545  BP: 111/51  Pulse: 60  Temp: 98.2 F (36.8 C)  Resp: 20   8. General: NAD, looks comfortable 9. Cardiovascular: RRR, S1, S2  10. Respiratory: clear bilaterally, No wheezing, rales or rhonchi 11. Abdomen: soft, non tender, no distention , bowel sounds normal. Foley catheter present. 12. Musculoskeletal: No edema b/l  Discharge Instructions    Current Discharge Medication List    START taking these medications   Details  amoxicillin-clavulanate (AUGMENTIN) 875-125 MG tablet Take 1 tablet by mouth 2 (two) times daily. Qty: 8 tablet, Refills: 0      CONTINUE these medications which have NOT CHANGED   Details  acetaminophen (TYLENOL) 500 MG tablet Take 500 mg by mouth every 6 (six) hours as needed for pain.    albuterol (PROAIR HFA) 108 (90 BASE) MCG/ACT inhaler Inhale 1-2 puffs into the lungs every 6 (six) hours as needed for wheezing or shortness of breath.    docusate sodium (COLACE) 100 MG capsule Take 100 mg by mouth 2 (two) times daily.    Multiple Vitamin (MULTIVITAMIN WITH MINERALS) TABS tablet Take 1 tablet by mouth daily.    ondansetron (ZOFRAN) 4 MG tablet Take 4 mg by mouth every 6 (six) hours as needed for nausea or vomiting.    oxyCODONE-acetaminophen (PERCOCET/ROXICET) 5-325 MG per  tablet Take 1/2 or 2 half tablets by mouth every 4 hours as needed for moderate or severe pain Qty: 180 tablet, Refills: 0    pantoprazole (PROTONIX) 40 MG tablet Take 1 tablet by mouth daily.    polyethylene glycol (MIRALAX / GLYCOLAX) packet Take 17 g by mouth daily. Qty: 14 each, Refills: 0        Allergies  Allergen Reactions  . Sulfa Antibiotics   . Aspirin Other (See Comments)    Makes stomach burn      The results of significant diagnostics from this hospitalization (including imaging, microbiology, ancillary and laboratory) are listed below for reference.    Significant Diagnostic Studies: Dg Chest Portable 1 View  03/13/2015  CLINICAL DATA:  Burning chest pain for several hours tonight. EXAM: PORTABLE CHEST 1 VIEW COMPARISON:  10/27/2014 FINDINGS: There is marked thoracic scoliosis. There is unchanged cardiomegaly. No confluent consolidation is evident. There is mild patchy opacity in the lateral left base which may be atelectatic. This has been present on a few of the recent prior studies. No large effusion is evident. No pneumothorax is evident. IMPRESSION: Mild patchy opacity in the lateral left base, possibly atelectatic. No large effusion. No segmental consolidation. Electronically Signed   By: Andreas Newport M.D.   On: 03/13/2015 01:12   Dg Abd Portable 2v  03/13/2015  CLINICAL DATA:  Abdominal pain and bloating EXAM: PORTABLE ABDOMEN - 2 VIEW COMPARISON:  None. FINDINGS: Scattered large and small bowel gas is noted. Postsurgical changes are seen. No free air is noted. A right renal calculus is noted. Scoliosis of thoracolumbar spine is noted. IMPRESSION: Right renal calculus. No acute abnormality is noted. Electronically Signed   By: Inez Catalina M.D.   On: 03/13/2015 01:26    Microbiology: Recent Results (from the past 240 hour(s))  Urine culture     Status: None   Collection Time: 03/13/15  1:20 AM  Result Value Ref Range Status   Specimen Description URINE, CATHETERIZED  Final   Special Requests NONE  Final   Culture   Final    NO GROWTH 1 DAY Performed at [facility]    Report Status 03/14/2015 FINAL  Final  Culture, blood (routine x 2)     Status: None (Preliminary result)   Collection Time: 03/13/15  1:47 AM  Result Value Ref Range Status    Specimen Description BLOOD RIGHT ANTECUBITAL  Final   Special Requests BOTTLES DRAWN AEROBIC AND ANAEROBIC [place]  Final   Culture NO GROWTH 1 DAY  Final   Report Status PENDING  Incomplete  Culture, blood (routine x 2)     Status: None (Preliminary result)   Collection Time: 03/13/15  2:05 AM  Result Value Ref Range Status   Specimen Description BLOOD LEFT FOREARM  Final   Special Requests   Final    BOTTLES DRAWN AEROBIC AND ANAEROBIC AEB=4CC ANA=2CC   Culture NO GROWTH 1 DAY  Final   Report Status PENDING  Incomplete     Labs: Basic Metabolic Panel:  Recent Labs Lab 03/13/15 0025 03/13/15 0628  NA 141 139  K 3.2* 3.6  CL 105 106  CO2 25 27  GLUCOSE 98 105*  BUN 12 14  CREATININE 0.61 0.71  CALCIUM 9.3 8.6*   Liver Function Tests:  Recent Labs Lab 03/13/15 0025  AST 22  ALT 9*  ALKPHOS 50  BILITOT 0.8  PROT 6.2*  ALBUMIN 3.8    Recent Labs Lab 03/13/15 0025  LIPASE 32   CBC:  Recent Labs Lab 03/13/15 0025 03/13/15 0628 03/14/15 0613  WBC 11.5* 14.5* 6.3  NEUTROABS  --  13.1*  --   HGB 11.9* 10.0* 9.7*  HCT 35.9* 30.4* 29.4*  MCV 95.5 95.6 95.8  PLT 211 202 186   Cardiac Enzymes:  Recent Labs Lab 03/13/15 0025 03/13/15 0628 03/13/15 1110 03/13/15 1654  TROPONINI <0.03 <0.03 <0.03 <0.03   BNP: BNP (last 3 results)  Recent Labs  09/03/14 0600  BNP 148.0*    Signed:  [name], MD  [facility] 03/15/2015, 6:46 AM    By signing my name below, I, Rennis Harding, attest that this documentation has been prepared under the direction and in the presence of [name], MD. Electronically signed: Rennis Harding, Scribe. 03/15/2015 9:50am   I, Dr. [name], personally performed the services described in this documentaiton. All medical record entries made by the scribe were at my direction and in my presence. I have reviewed the chart and agree that the record reflects my personal performance and is accurate  and complete  [name], MD, 03/15/2015 10:01 AM

## 2015-03-15 NOTE — Progress Notes (Signed)
Pt reports IV is red. Site D/C'd. Refuses further attempts at this time for a new IV insertion.

## 2015-03-15 NOTE — Care Management Note (Signed)
Case Management Note  Patient Details  Name: [name] MRN: [id number] Date of Birth: 1919/02/11  Subjective/Objective:                  Pt is not interested in walker. Order given to pt to take with her and she may take it DME provider if she changes her mind.   Action/Plan: Pt discharging today with self care and PD care. No further CM needs.   Expected Discharge Date:                  Expected Discharge Plan:  Home/Self Care  In-House Referral:  NA  Discharge planning Services  CM Consult  Post Acute Care Choice:  NA Choice offered to:  NA  DME Arranged:    DME Agency:     HH Arranged:    HH Agency:     Status of Service:  Completed, signed off  Medicare Important Message Given:    Date Medicare IM Given:    Medicare IM give by:    Date Additional Medicare IM Given:    Additional Medicare Important Message give by:     If discussed at [place] of Stay Meetings, dates discussed:    Additional Comments:  Sherald Barge, RN 03/15/2015, 11:19 AM

## 2015-03-18 LAB — CULTURE, BLOOD (ROUTINE X 2)
CULTURE: NO GROWTH
Culture: NO GROWTH

## 2015-04-03 ENCOUNTER — Ambulatory Visit: Payer: Medicare Other | Admitting: Urology

## 2015-09-13 ENCOUNTER — Other Ambulatory Visit (HOSPITAL_COMMUNITY): Payer: Self-pay | Admitting: Nurse Practitioner

## 2015-09-13 ENCOUNTER — Ambulatory Visit (HOSPITAL_COMMUNITY)
Admission: RE | Admit: 2015-09-13 | Discharge: 2015-09-13 | Disposition: A | Discharge status: Home / Self Care | Payer: Medicare Other | Source: Ambulatory Visit | Attending: Nurse Practitioner | Admitting: Nurse Practitioner

## 2015-09-13 DIAGNOSIS — N200 Calculus of kidney: Secondary | ICD-10-CM | POA: Insufficient documentation

## 2015-09-13 DIAGNOSIS — K5901 Slow transit constipation: Secondary | ICD-10-CM

## 2015-09-13 DIAGNOSIS — R109 Unspecified abdominal pain: Secondary | ICD-10-CM | POA: Insufficient documentation

## 2015-09-13 DIAGNOSIS — R1084 Generalized abdominal pain: Secondary | ICD-10-CM

## 2015-09-13 DIAGNOSIS — K5900 Constipation, unspecified: Secondary | ICD-10-CM | POA: Insufficient documentation

## 2016-05-08 ENCOUNTER — Encounter (HOSPITAL_COMMUNITY): Payer: Self-pay | Admitting: *Deleted

## 2016-05-08 ENCOUNTER — Observation Stay (HOSPITAL_COMMUNITY)
Admission: EM | Admit: 2016-05-08 | Discharge: 2016-05-09 | Disposition: A | Discharge status: Home / Self Care | Payer: Medicare Other | Attending: Internal Medicine | Admitting: Internal Medicine

## 2016-05-08 DIAGNOSIS — S299XXA Unspecified injury of thorax, initial encounter: Secondary | ICD-10-CM | POA: Diagnosis present

## 2016-05-08 DIAGNOSIS — R262 Difficulty in walking, not elsewhere classified: Secondary | ICD-10-CM

## 2016-05-08 DIAGNOSIS — D72829 Elevated white blood cell count, unspecified: Secondary | ICD-10-CM | POA: Diagnosis present

## 2016-05-08 DIAGNOSIS — S2249XA Multiple fractures of ribs, unspecified side, initial encounter for closed fracture: Secondary | ICD-10-CM | POA: Diagnosis present

## 2016-05-08 DIAGNOSIS — Y92009 Unspecified place in unspecified non-institutional (private) residence as the place of occurrence of the external cause: Secondary | ICD-10-CM | POA: Diagnosis not present

## 2016-05-08 DIAGNOSIS — Y939 Activity, unspecified: Secondary | ICD-10-CM | POA: Insufficient documentation

## 2016-05-08 DIAGNOSIS — Y999 Unspecified external cause status: Secondary | ICD-10-CM | POA: Insufficient documentation

## 2016-05-08 DIAGNOSIS — Z87891 Personal history of nicotine dependence: Secondary | ICD-10-CM | POA: Insufficient documentation

## 2016-05-08 DIAGNOSIS — S2242XA Multiple fractures of ribs, left side, initial encounter for closed fracture: Secondary | ICD-10-CM | POA: Diagnosis not present

## 2016-05-08 DIAGNOSIS — Z79899 Other long term (current) drug therapy: Secondary | ICD-10-CM | POA: Diagnosis not present

## 2016-05-08 DIAGNOSIS — D649 Anemia, unspecified: Secondary | ICD-10-CM | POA: Diagnosis present

## 2016-05-08 DIAGNOSIS — I509 Heart failure, unspecified: Secondary | ICD-10-CM | POA: Insufficient documentation

## 2016-05-08 DIAGNOSIS — K279 Peptic ulcer, site unspecified, unspecified as acute or chronic, without hemorrhage or perforation: Secondary | ICD-10-CM | POA: Diagnosis present

## 2016-05-08 DIAGNOSIS — W010XXA Fall on same level from slipping, tripping and stumbling without subsequent striking against object, initial encounter: Secondary | ICD-10-CM | POA: Diagnosis not present

## 2016-05-08 DIAGNOSIS — M25552 Pain in left hip: Secondary | ICD-10-CM | POA: Insufficient documentation

## 2016-05-08 DIAGNOSIS — W19XXXA Unspecified fall, initial encounter: Secondary | ICD-10-CM

## 2016-05-08 DIAGNOSIS — I5032 Chronic diastolic (congestive) heart failure: Secondary | ICD-10-CM | POA: Diagnosis present

## 2016-05-08 DIAGNOSIS — Z8542 Personal history of malignant neoplasm of other parts of uterus: Secondary | ICD-10-CM | POA: Insufficient documentation

## 2016-05-08 NOTE — ED Triage Notes (Signed)
Pt tripped over a potted plant and landed on her left side; pt is c/o left shoulder and left hip

## 2016-05-09 ENCOUNTER — Emergency Department (HOSPITAL_COMMUNITY): Payer: Medicare Other

## 2016-05-09 ENCOUNTER — Encounter (HOSPITAL_COMMUNITY): Payer: Self-pay | Admitting: Family Medicine

## 2016-05-09 DIAGNOSIS — S2242XA Multiple fractures of ribs, left side, initial encounter for closed fracture: Secondary | ICD-10-CM | POA: Diagnosis not present

## 2016-05-09 DIAGNOSIS — S2249XA Multiple fractures of ribs, unspecified side, initial encounter for closed fracture: Secondary | ICD-10-CM | POA: Diagnosis present

## 2016-05-09 DIAGNOSIS — K279 Peptic ulcer, site unspecified, unspecified as acute or chronic, without hemorrhage or perforation: Secondary | ICD-10-CM

## 2016-05-09 DIAGNOSIS — W19XXXA Unspecified fall, initial encounter: Secondary | ICD-10-CM | POA: Diagnosis present

## 2016-05-09 DIAGNOSIS — D72829 Elevated white blood cell count, unspecified: Secondary | ICD-10-CM | POA: Diagnosis not present

## 2016-05-09 LAB — BASIC METABOLIC PANEL
Anion gap: 9 (ref 5–15)
BUN: 12 mg/dL (ref 6–20)
CALCIUM: 9.1 mg/dL (ref 8.9–10.3)
CO2: 24 mmol/L (ref 22–32)
CREATININE: 0.66 mg/dL (ref 0.44–1.00)
Chloride: 105 mmol/L (ref 101–111)
GFR calc Af Amer: >60 mL/min (ref >60)
GLUCOSE: 77 mg/dL (ref 65–99)
Potassium: 3.6 mmol/L (ref 3.5–5.1)
SODIUM: 138 mmol/L (ref 135–145)

## 2016-05-09 LAB — CBC WITH DIFFERENTIAL/PLATELET
Basophils Absolute: 0 10*3/uL (ref 0.0–0.1)
Basophils Relative: 0 %
EOS ABS: 0.1 10*3/uL (ref 0.0–0.7)
EOS PCT: 1 %
HCT: 34.2 % — ABNORMAL LOW (ref 36.0–46.0)
Hemoglobin: 11.4 g/dL — ABNORMAL LOW (ref 12.0–15.0)
LYMPHS ABS: 2.2 10*3/uL (ref 0.7–4.0)
LYMPHS PCT: 16 %
MCH: 32.7 pg (ref 26.0–34.0)
MCHC: 33.3 g/dL (ref 30.0–36.0)
MCV: 98 fL (ref 78.0–100.0)
MONO ABS: 1.3 10*3/uL — AB (ref 0.1–1.0)
Monocytes Relative: 10 %
Neutro Abs: 10 10*3/uL — ABNORMAL HIGH (ref 1.7–7.7)
Neutrophils Relative %: 73 %
PLATELETS: 207 10*3/uL (ref 150–400)
RBC: 3.49 MIL/uL — AB (ref 3.87–5.11)
RDW: 14.2 % (ref 11.5–15.5)
WBC: 13.6 10*3/uL — AB (ref 4.0–10.5)

## 2016-05-09 MED ORDER — FENTANYL CITRATE (PF) 100 MCG/2ML IJ SOLN
50.0000 ug | Freq: Once | INTRAMUSCULAR | Status: AC
  Administered 2016-05-09: 50 ug via INTRAVENOUS
  Filled 2016-05-09: qty 2

## 2016-05-09 MED ORDER — SODIUM CHLORIDE 0.9 % IV SOLN: INTRAVENOUS | Status: DC

## 2016-05-09 MED ORDER — ALBUTEROL SULFATE (2.5 MG/3ML) 0.083% IN NEBU: 3.0000 mL | INHALATION_SOLUTION | Freq: Four times a day (QID) | RESPIRATORY_TRACT | Status: DC | PRN

## 2016-05-09 MED ORDER — ACETAMINOPHEN 500 MG PO TABS: 500.0000 mg | ORAL_TABLET | Freq: Four times a day (QID) | ORAL | Status: DC | PRN

## 2016-05-09 MED ORDER — OXYCODONE-ACETAMINOPHEN 5-325 MG PO TABS
1.0000 | ORAL_TABLET | ORAL | Status: DC | PRN
  Administered 2016-05-09 (×2): 1 via ORAL
  Filled 2016-05-09 (×2): qty 1

## 2016-05-09 MED ORDER — DOCUSATE SODIUM 100 MG PO CAPS
100.0000 mg | ORAL_CAPSULE | Freq: Two times a day (BID) | ORAL | Status: DC
  Administered 2016-05-09: 100 mg via ORAL
  Filled 2016-05-09: qty 1

## 2016-05-09 MED ORDER — PANTOPRAZOLE SODIUM 40 MG PO TBEC
40.0000 mg | DELAYED_RELEASE_TABLET | Freq: Every day | ORAL | Status: DC
  Administered 2016-05-09: 40 mg via ORAL
  Filled 2016-05-09: qty 1

## 2016-05-09 MED ORDER — FENTANYL CITRATE (PF) 100 MCG/2ML IJ SOLN
50.0000 ug | INTRAMUSCULAR | Status: DC | PRN
Start: 2016-05-09 — End: 2016-05-09

## 2016-05-09 MED ORDER — HYDROMORPHONE HCL 1 MG/ML IJ SOLN
0.5000 mg | INTRAMUSCULAR | Status: DC | PRN
  Administered 2016-05-09 (×3): 0.5 mg via INTRAVENOUS
  Filled 2016-05-09 (×3): qty 1

## 2016-05-09 MED ORDER — ADULT MULTIVITAMIN W/MINERALS CH
1.0000 | ORAL_TABLET | Freq: Every day | ORAL | Status: DC
  Administered 2016-05-09: 1 via ORAL
  Filled 2016-05-09: qty 1

## 2016-05-09 MED ORDER — ENOXAPARIN SODIUM 30 MG/0.3ML ~~LOC~~ SOLN
30.0000 mg | SUBCUTANEOUS | Status: DC
  Administered 2016-05-09: 30 mg via SUBCUTANEOUS
  Filled 2016-05-09: qty 0.3

## 2016-05-09 MED ORDER — ENOXAPARIN SODIUM 40 MG/0.4ML ~~LOC~~ SOLN: 40.0000 mg | SUBCUTANEOUS | Status: DC

## 2016-05-09 MED ORDER — ONDANSETRON HCL 4 MG/2ML IJ SOLN
4.0000 mg | Freq: Four times a day (QID) | INTRAMUSCULAR | Status: DC | PRN
  Administered 2016-05-09: 4 mg via INTRAVENOUS
  Filled 2016-05-09: qty 2

## 2016-05-09 MED ORDER — ONDANSETRON HCL 4 MG PO TABS: 4.0000 mg | ORAL_TABLET | Freq: Four times a day (QID) | ORAL | Status: DC | PRN

## 2016-05-09 MED ORDER — POLYETHYLENE GLYCOL 3350 17 G PO PACK
17.0000 g | PACK | Freq: Every day | ORAL | Status: DC
  Administered 2016-05-09: 17 g via ORAL
  Filled 2016-05-09: qty 1

## 2016-05-09 NOTE — Care Management Note (Addendum)
Case Management Note  Patient Details  Name: [name] MRN: [id number] Date of Birth: May 11, 1919  Subjective/Objective:                  Patient adm with fall and rib fractures. Lives alone, has an aide that is with her 4 hours during day and another aide at night.   Action/Plan: She is recommended for HHPT. Patient agreeable. Offered choice. [place] [facility] notified and will obtain orders from chart. Patient will be seen this weekend.    Expected Discharge Date:       05/09/2016           Expected Discharge Plan:  [place]  In-House Referral:  NA  Discharge planning Services  CM Consult  Post Acute Care Choice:  Home Health Choice offered to:  Patient  DME Arranged:    DME Agency:     HH Arranged:  PT [place]:  [facility] (now [facility])  Status of Service:  In process, will continue to follow  If discussed at Long Length of Stay Meetings, dates discussed:    Additional Comments:  Murad Staples, Chauncey Reading, RN 05/09/2016, 2:05 PM

## 2016-05-09 NOTE — ED Notes (Signed)
Pt assisted to bedside commode & returned back to the bed.

## 2016-05-09 NOTE — ED Notes (Signed)
Pt lying in bed with eyes closed.

## 2016-05-09 NOTE — H&P (Signed)
History and Physical    [name] [id number] DOB: 06-04-1918 DOA: 05/08/2016  PCP: Wende Neighbors, MD   Patient coming from: Home  Chief Complaint: fall at home with pain in left hip, left shoulder, and left chest wall   HPI: [name] is a 82 y.o. female with medical history significant for chronic diastolic CHF, chronic anemia, and osteoarthritis with chronic pain who presents the emergency department for evaluation of pain in the left hip, left shoulder, and left chest wall after a fall at home. Patient reports that she was in her usual state at home when she tripped over a potted plant, falling onto her left side. She experienced immediate pain involving the left hip, left shoulder, and left lateral chest wall. She denies hitting her head and denies any loss of consciousness. She reports that she was in her usual state just prior to the fall with no recent fevers or chills and no recent chest pain, palpitations, dyspnea, or cough. She describes severe chronic pain that is attributed to osteoarthritis which she manages with Tylenol and Percocet at home. She had previously used NSAIDs, but these were discontinued when she developed gastric ulcers.  ED Course: Upon arrival to the ED, patient is found to be afebrile, saturating well on room air, and with vitals otherwise stable. Chemistry panel is unremarkable and CBC is notable for a leukocytosis to 13,600 and a stable normocytic anemia with hemoglobin of 11.4. Radiographs of the left hip and left humerus are negative for acute fracture or dislocation and radiographs of the chest revealed mildly displaced fractures of the left lateral sixth through ninth ribs, as well as cardiomegaly and mild pulmonary vascular congestion. Patient was treated with multiple doses of fentanyl in the ED for her pain. Patient is quite elderly, lives alone, and with multiple rib fractures she is at high risk for poor outcome and will be observed on the medical  surgical unit on for ongoing evaluation and management of this.  Review of Systems:  All other systems reviewed and apart from HPI, are negative.  Past Medical History:  Diagnosis Date  . Anemia   . Arthritis   . Cancer ([place])    uterine surg only  . CHF (congestive heart failure) ([place])   . Colitis 12/31/2012   presumed C.Diff. patient declined colonoscopy  . Hypotension   . Kidney stone   . Muscle weakness (generalized)   . Osteoarthritis   . Pancreatic atrophy 12/31/2012  . Pneumonia     Past Surgical History:  Procedure Laterality Date  . ABDOMINAL HYSTERECTOMY    . CHOLECYSTECTOMY    . ESOPHAGOGASTRODUODENOSCOPY N/A 03/03/2013   RMR: Significant gastric ulcer without bleeding stigmata requiring no endoscopic intervention today. Noncritical Schatzki's ring. Small hiatal hernia  . ESOPHAGOGASTRODUODENOSCOPY N/A 08/24/2013   Dr. [name]: Hiatal hernia.  Gastric mucosal scar formation at site of previously noted gastric ulcerations  . JOINT REPLACEMENT    . LEFT KNEE SURGERY AND REVISION    . RIGHT HIP SURGERY       reports that she quit smoking about 65 years ago. Her smoking use included Cigarettes. She has never used smokeless tobacco. She reports that she does not drink alcohol or use drugs.  Allergies  Allergen Reactions  . Sulfa Antibiotics   . Aspirin Other (See Comments)    Makes stomach burn    Family History  Problem Relation Age of Onset  . Cancer Mother      Prior to Admission medications  Medication Sig Start Date End Date Taking? Authorizing Provider  acetaminophen (TYLENOL) 500 MG tablet Take 500 mg by mouth every 6 (six) hours as needed for pain.    Historical Provider, MD  albuterol (PROAIR HFA) 108 (90 BASE) MCG/ACT inhaler Inhale 1-2 puffs into the lungs every 6 (six) hours as needed for wheezing or shortness of breath.    Historical Provider, MD  docusate sodium (COLACE) 100 MG capsule Take 100 mg by mouth 2 (two) times daily.    Historical  Provider, MD  Multiple Vitamin (MULTIVITAMIN WITH MINERALS) TABS tablet Take 1 tablet by mouth daily.    Historical Provider, MD  ondansetron (ZOFRAN) 4 MG tablet Take 4 mg by mouth every 6 (six) hours as needed for nausea or vomiting.    Historical Provider, MD  oxyCODONE-acetaminophen (PERCOCET/ROXICET) 5-325 MG per tablet Take 1/2 or 2 half tablets by mouth every 4 hours as needed for moderate or severe pain Patient taking differently: Take 0.5-1 tablets by mouth every 4 (four) hours as needed for moderate pain or severe pain.  09/04/14   [name], DO  pantoprazole (PROTONIX) 40 MG tablet Take 1 tablet by mouth daily. 03/12/15   Historical Provider, MD  polyethylene glycol (MIRALAX / GLYCOLAX) packet Take 17 g by mouth daily. 10/28/14   [name], MD    Physical Exam: Vitals:   05/08/16 2341 05/08/16 2346 05/09/16 0222  BP:  147/63 151/68  Pulse:  62 100  Resp:  16 16  Temp:  98.1 F (36.7 C)   TempSrc:  Oral   SpO2:  98% 97%  Weight: 32.7 kg (72 lb)    Height: 4\' 6"  (1.372 m)        Constitutional: NAD, calm, appears uncomfortable Eyes: PERTLA, lids and conjunctivae normal ENMT: Mucous membranes are moist. Posterior pharynx clear of any exudate or lesions.   Neck: normal, supple, no masses, no thyromegaly Respiratory: clear to auscultation bilaterally, no wheezing, no crackles. Normal respiratory effort.   Cardiovascular: S1 & S2 heard, regular rate and rhythm. No extremity edema. No significant JVD. Abdomen: No distension, no tenderness, no masses palpated. Bowel sounds normal.  Musculoskeletal: no clubbing / cyanosis. No red, hot, or swollen joints. Skin: no significant rashes, lesions, ulcers. Warm, dry, well-perfused. Neurologic: CN 2-12 grossly intact. Sensation intact, DTR normal. Strength 5/5 in all 4 limbs.  Psychiatric: Normal judgment and insight. Alert and oriented x 3. Normal mood and affect.     Labs on Admission: I have personally reviewed following  labs and imaging studies  CBC:  Recent Labs Lab 05/09/16 0254  WBC 13.6*  NEUTROABS 10.0*  HGB 11.4*  HCT 34.2*  MCV 98.0  PLT [id number]   Basic Metabolic Panel:  Recent Labs Lab 05/09/16 0254  NA 138  K 3.6  CL 105  CO2 24  GLUCOSE 77  BUN 12  CREATININE 0.66  CALCIUM 9.1   GFR: Estimated Creatinine Clearance: 21.1 mL/min (by C-G formula based on SCr of 0.66 mg/dL). Liver Function Tests: No results for input(s): AST, ALT, ALKPHOS, BILITOT, PROT, ALBUMIN in the last 168 hours. No results for input(s): LIPASE, AMYLASE in the last 168 hours. No results for input(s): AMMONIA in the last 168 hours. Coagulation Profile: No results for input(s): INR, PROTIME in the last 168 hours. Cardiac Enzymes: No results for input(s): CKTOTAL, CKMB, CKMBINDEX, TROPONINI in the last 168 hours. BNP (last 3 results) No results for input(s): PROBNP in the last 8760 hours. HbA1C: No results for input(s):  HGBA1C in the last 72 hours. CBG: No results for input(s): GLUCAP in the last 168 hours. Lipid Profile: No results for input(s): CHOL, HDL, LDLCALC, TRIG, CHOLHDL, LDLDIRECT in the last 72 hours. Thyroid Function Tests: No results for input(s): TSH, T4TOTAL, FREET4, T3FREE, THYROIDAB in the last 72 hours. Anemia Panel: No results for input(s): VITAMINB12, FOLATE, FERRITIN, TIBC, IRON, RETICCTPCT in the last 72 hours. Urine analysis:    Component Value Date/Time   COLORURINE YELLOW 03/13/2015 0120   APPEARANCEUR CLEAR 03/13/2015 0120   LABSPEC 1.010 03/13/2015 0120   PHURINE 5.5 03/13/2015 0120   GLUCOSEU NEGATIVE 03/13/2015 0120   HGBUR MODERATE (A) 03/13/2015 0120   BILIRUBINUR NEGATIVE 03/13/2015 0120   KETONESUR TRACE (A) 03/13/2015 0120   PROTEINUR NEGATIVE 03/13/2015 0120   UROBILINOGEN 0.2 03/13/2015 0120   NITRITE NEGATIVE 03/13/2015 0120   LEUKOCYTESUR NEGATIVE 03/13/2015 0120   Sepsis Labs: @LABRCNTIP (procalcitonin:4,lacticidven:4) )No results found for this or any  previous visit (from the past 240 hour(s)).   Radiological Exams on Admission: Dg Ribs Unilateral W/chest Left  Result Date: 05/09/2016 CLINICAL DATA:  Tripped over plant at home, with left shoulder pain and left-sided chest pain. Initial encounter. EXAM: LEFT RIBS AND CHEST - 3+ VIEW COMPARISON:  Chest radiograph performed 03/13/2015 FINDINGS: There are mildly displaced fractures of the left lateral sixth through ninth ribs, of indeterminate age. Right convex lumbar scoliosis is noted. The lungs are difficult to fully assess due to the degree of scoliosis. Mild vascular congestion is noted. No pleural effusion or pneumothorax is seen. Degenerative change is seen at the glenohumeral joints bilaterally. The heart is mildly enlarged. Postoperative change is noted at the mid abdomen. IMPRESSION: 1. Mildly displaced fractures of the left lateral sixth through ninth ribs, of indeterminate age. 2. Right convex lumbar scoliosis noted. 3. Mild vascular congestion and mild cardiomegaly. Electronically Signed   By: Garald Balding M.D.   On: 05/09/2016 01:38   Dg Shoulder Left  Result Date: 05/09/2016 CLINICAL DATA:  Tripped over plant in home and fell, with left shoulder pain. Initial encounter. EXAM: LEFT SHOULDER - 2+ VIEW COMPARISON:  Chest radiograph performed 03/13/2015 FINDINGS: There is no evidence of fracture or dislocation. The left humeral head is seated within the glenoid fossa. Degenerative change is noted at the left glenohumeral joint, with cortical irregularity, mild deformity of the left humeral head, and sclerosis at the glenoid fossa. Mild degenerative change is noted at the left acromioclavicular joint. Soft tissue swelling is noted overlying the left shoulder. The visualized portions of the left lung are clear. IMPRESSION: 1. No evidence of fracture or dislocation. 2. Chronic degenerative change at the left glenohumeral joint. Electronically Signed   By: Garald Balding M.D.   On: 05/09/2016  01:19   Dg Humerus Left  Result Date: 05/09/2016 CLINICAL DATA:  Tripped over plant in home and fell, with left upper arm pain. Initial encounter. EXAM: LEFT HUMERUS - 2+ VIEW COMPARISON:  None. FINDINGS: There is no evidence of fracture or dislocation. The left humerus appears intact. The left elbow joint is grossly unremarkable in appearance. Degenerative change is noted at the left glenohumeral joint, with sclerosis and joint space narrowing. Associated osteophyte formation and mild chronic deformity of the left humeral head is noted. Soft tissue swelling is noted overlying the left shoulder. IMPRESSION: 1. No evidence of fracture or dislocation. 2. Degenerative change at the left glenohumeral joint. Electronically Signed   By: Garald Balding M.D.   On: 05/09/2016 01:17   Dg  Hip Unilat With Pelvis 2-3 Views Left  Result Date: 05/09/2016 CLINICAL DATA:  Tripped over plant at home and fell, with left hip pain. Initial encounter. EXAM: DG HIP (WITH OR WITHOUT PELVIS) 2-3V LEFT COMPARISON:  Left hip radiographs performed 09/18/2014 FINDINGS: There is no evidence of fracture or dislocation. Mild left convex lumbar scoliosis is noted. There is chronic deformity of the left superior and inferior pubic rami. 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 The arthroplasty is otherwise unremarkable in appearance. The left hip joint is unremarkable in appearance. The sacroiliac joints are unremarkable in appearance. The visualized bowel gas pattern is grossly unremarkable in appearance. Scattered phleboliths are noted within the pelvis. IMPRESSION: 1. No evidence of fracture or dislocation. 2. 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 Arthroplasty otherwise unremarkable in appearance. Electronically Signed   By: Garald Balding M.D.   On: 05/09/2016 01:32    EKG: Not performed, will obtain as appropriate.    Assessment/Plan  1. Fall with multiple rib fractures - Pt suffered ground-level mechanical fall just PTA  - Radiographs notable for fxs in left lateral 6th-9th ribs  - Plan to observe on med-surg for pain-control, incentive spirometry, and PT eval    2. Leukocytosis  - WBC is 13,600 on admission without fever of apparent foci of infection  - Likely reactive from the trauma just PTA - Plan to culture if febrile    3. Normocytic anemia  - Hgb is 11.4 on admission and stable relative to priors with no s/s of bleeding   4. GERD - Hx of gastric ulcers, had healed by time of last EGD in 2015  - Continue daily Protonix   5. OA with chronic pain - Pt describes severe chronic pain, particularly in shoulders - She is managed at home with Percocet and bowel regimen; this will be continued  - Given the acute injuries with severe pain, prn IV Dilaudid ordered for acute phase     DVT prophylaxis: sq Lovenox Code Status: Full  Family Communication: Discussed with patient Disposition Plan: Observe on med-surg Consults called: None Admission status: Observation    Vianne Bulls, MD [facility] Pager [phone]  If 7PM-7AM, please contact night-coverage [email or web address] Password [facility]  05/09/2016, 4:48 AM

## 2016-05-09 NOTE — Discharge Summary (Signed)
Physician Discharge Summary  [name] [id number] DOB: 05-02-1919 DOA: 05/08/2016  PCP: Wende Neighbors, MD  Admit date: 05/08/2016 Discharge date: 05/09/2016  Time spent: 45 minutes  Recommendations for Outpatient Follow-up:  -Will be discharged home today with [facility] services. -Advised to follow up with PCP in 2 weeks.   Discharge Diagnoses:  Principal Problem:   Multiple rib fractures Active Problems:   PUD (peptic ulcer disease)   Chronic diastolic CHF (congestive heart failure) (HCC)   Anemia   Fall   Leukocytosis   Discharge Condition: Stable and improved  Filed Weights   05/08/16 2341 05/09/16 0601  Weight: 32.7 kg (72 lb) 36.8 kg (81 lb 3.2 oz)    History of present illness:  As per Dr. Myna Hidalgo on 12/29: [name] is a 82 y.o. female with medical history significant for chronic diastolic CHF, chronic anemia, and osteoarthritis with chronic pain who presents the emergency department for evaluation of pain in the left hip, left shoulder, and left chest wall after a fall at home. Patient reports that she was in her usual state at home when she tripped over a potted plant, falling onto her left side. She experienced immediate pain involving the left hip, left shoulder, and left lateral chest wall. She denies hitting her head and denies any loss of consciousness. She reports that she was in her usual state just prior to the fall with no recent fevers or chills and no recent chest pain, palpitations, dyspnea, or cough. She describes severe chronic pain that is attributed to osteoarthritis which she manages with Tylenol and Percocet at home. She had previously used NSAIDs, but these were discontinued when she developed gastric ulcers.  ED Course: Upon arrival to the ED, patient is found to be afebrile, saturating well on room air, and with vitals otherwise stable. Chemistry panel is unremarkable and CBC is notable for a leukocytosis to 13,600 and a stable normocytic  anemia with hemoglobin of 11.4. Radiographs of the left hip and left humerus are negative for acute fracture or dislocation and radiographs of the chest revealed mildly displaced fractures of the left lateral sixth through ninth ribs, as well as cardiomegaly and mild pulmonary vascular congestion. Patient was treated with multiple doses of fentanyl in the ED for her pain. Patient is quite elderly, lives alone, and with multiple rib fractures she is at high risk for poor outcome and will be observed on the medical surgical unit on for ongoing evaluation and management of this.  Hospital Course:   Mechanical Fall -Resulting in multiple rib fractures. -No current oxygen requirements. -Pain seems to be mostly fairly controlled on home dose of oxycodone. No prescription has been provided on DC as she has enough at home. -Has been evaluated by PT and has been deemed safe to DC home with [facility] services which will be arranged prior to DC.  Rest of chronic medical conditions have been stable in this <12 hour hospitalization.  Procedures:  None   Consultations:  None  Discharge Instructions  Discharge Instructions    Diet - low sodium heart healthy    Complete by:  As directed    Increase activity slowly    Complete by:  As directed      Allergies as of 05/09/2016      Reactions   Sulfa Antibiotics Other (See Comments)   Reaction:  Unknown    Aspirin Other (See Comments)   Reaction:  Burning of pts stomach  Medication List    TAKE these medications   acetaminophen 500 MG tablet Commonly known as:  TYLENOL Take 500-1,000 mg by mouth every 6 (six) hours as needed for mild pain, moderate pain, fever or headache.   docusate sodium 100 MG capsule Commonly known as:  COLACE Take 100 mg by mouth 2 (two) times daily as needed for mild constipation.   multivitamin with minerals Tabs tablet Take 1 tablet by mouth daily.   ondansetron 4 MG tablet Commonly known as:  ZOFRAN Take 4 mg  by mouth every 6 (six) hours as needed for nausea or vomiting.   oxyCODONE-acetaminophen 7.5-325 MG tablet Commonly known as:  PERCOCET Take 1 tablet by mouth every 4 (four) hours as needed for severe pain.   pantoprazole 40 MG tablet Commonly known as:  PROTONIX Take 40 mg by mouth daily.   polyethylene glycol packet Commonly known as:  MIRALAX / GLYCOLAX Take 17 g by mouth daily as needed for mild constipation.   PROAIR HFA 108 (90 Base) MCG/ACT inhaler Generic drug:  albuterol Inhale 1-2 puffs into the lungs every 6 (six) hours as needed for wheezing or shortness of breath.      Allergies  Allergen Reactions  . Sulfa Antibiotics Other (See Comments)    Reaction:  Unknown   . Aspirin Other (See Comments)    Reaction:  Burning of pts stomach    Follow-up Information    Wende Neighbors, MD. Schedule an appointment as soon as possible for a visit in 2 week(s).   Specialty:  Internal Medicine Contact information: [place] [address] [phone]            The results of significant diagnostics from this hospitalization (including imaging, microbiology, ancillary and laboratory) are listed below for reference.    Significant Diagnostic Studies: Dg Ribs Unilateral W/chest Left  Result Date: 05/09/2016 CLINICAL DATA:  Tripped over plant at home, with left shoulder pain and left-sided chest pain. Initial encounter. EXAM: LEFT RIBS AND CHEST - 3+ VIEW COMPARISON:  Chest radiograph performed 03/13/2015 FINDINGS: There are mildly displaced fractures of the left lateral sixth through ninth ribs, of indeterminate age. Right convex lumbar scoliosis is noted. The lungs are difficult to fully assess due to the degree of scoliosis. Mild vascular congestion is noted. No pleural effusion or pneumothorax is seen. Degenerative change is seen at the glenohumeral joints bilaterally. The heart is mildly enlarged. Postoperative change is noted at the mid abdomen. IMPRESSION:  1. Mildly displaced fractures of the left lateral sixth through ninth ribs, of indeterminate age. 2. Right convex lumbar scoliosis noted. 3. Mild vascular congestion and mild cardiomegaly. Electronically Signed   By: Garald Balding M.D.   On: 05/09/2016 01:38   Dg Shoulder Left  Result Date: 05/09/2016 CLINICAL DATA:  Tripped over plant in home and fell, with left shoulder pain. Initial encounter. EXAM: LEFT SHOULDER - 2+ VIEW COMPARISON:  Chest radiograph performed 03/13/2015 FINDINGS: There is no evidence of fracture or dislocation. The left humeral head is seated within the glenoid fossa. Degenerative change is noted at the left glenohumeral joint, with cortical irregularity, mild deformity of the left humeral head, and sclerosis at the glenoid fossa. Mild degenerative change is noted at the left acromioclavicular joint. Soft tissue swelling is noted overlying the left shoulder. The visualized portions of the left lung are clear. IMPRESSION: 1. No evidence of fracture or dislocation. 2. Chronic degenerative change at the left glenohumeral joint. Electronically Signed   By:  Garald Balding M.D.   On: 05/09/2016 01:19   Dg Humerus Left  Result Date: 05/09/2016 CLINICAL DATA:  Tripped over plant in home and fell, with left upper arm pain. Initial encounter. EXAM: LEFT HUMERUS - 2+ VIEW COMPARISON:  None. FINDINGS: There is no evidence of fracture or dislocation. The left humerus appears intact. The left elbow joint is grossly unremarkable in appearance. Degenerative change is noted at the left glenohumeral joint, with sclerosis and joint space narrowing. Associated osteophyte formation and mild chronic deformity of the left humeral head is noted. Soft tissue swelling is noted overlying the left shoulder. IMPRESSION: 1. No evidence of fracture or dislocation. 2. Degenerative change at the left glenohumeral joint. Electronically Signed   By: Garald Balding M.D.   On: 05/09/2016 01:17   Dg Hip Unilat With  Pelvis 2-3 Views Left  Result Date: 05/09/2016 CLINICAL DATA:  Tripped over plant at home and fell, with left hip pain. Initial encounter. EXAM: DG HIP (WITH OR WITHOUT PELVIS) 2-3V LEFT COMPARISON:  Left hip radiographs performed 09/18/2014 FINDINGS: There is no evidence of fracture or dislocation. Mild left convex lumbar scoliosis is noted. There is chronic deformity of the left superior and inferior pubic rami. 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 The arthroplasty is otherwise unremarkable in appearance. The left hip joint is unremarkable in appearance. The sacroiliac joints are unremarkable in appearance. The visualized bowel gas pattern is grossly unremarkable in appearance. Scattered phleboliths are noted within the pelvis. IMPRESSION: 1. No evidence of fracture or dislocation. 2. 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 Arthroplasty otherwise unremarkable in appearance. Electronically Signed   By: Garald Balding M.D.   On: 05/09/2016 01:32    Microbiology: No results found for this or any previous visit (from the past 240 hour(s)).   Labs: Basic Metabolic Panel:  Recent Labs Lab 05/09/16 0254  NA 138  K 3.6  CL 105  CO2 24  GLUCOSE 77  BUN 12  CREATININE 0.66  CALCIUM 9.1   Liver Function Tests: No results for input(s): AST, ALT, ALKPHOS, BILITOT, PROT, ALBUMIN in the last 168 hours. No results for input(s): LIPASE, AMYLASE in the last 168 hours. No results for input(s): AMMONIA in the last 168 hours. CBC:  Recent Labs Lab 05/09/16 0254  WBC 13.6*  NEUTROABS 10.0*  HGB 11.4*  HCT 34.2*  MCV 98.0  PLT 207   Cardiac Enzymes: No results for input(s): CKTOTAL, CKMB, CKMBINDEX, TROPONINI in the last 168 hours. BNP: BNP (last 3 results) No results for input(s): BNP in the last 8760 hours.  ProBNP (last 3 results) No results for input(s): PROBNP in the last 8760  hours.  CBG: No results for input(s): GLUCAP in the last 168 hours.     SignedLelon Frohlich  [facility] Pager: [phone] 05/09/2016, 4:07 PM

## 2016-05-09 NOTE — Evaluation (Addendum)
Physical Therapy Evaluation Patient Details Name: [name] MRN: [id number] DOB: 07-Jun-1918 Today's Date: 05/09/2016   History of Present Illness  [name] is a 82 y.o. female with medical history significant for chronic diastolic CHF, chronic anemia, and osteoarthritis with chronic pain who presents the emergency department for evaluation of pain in the left hip, left shoulder, and left chest wall after a fall at home. Patient reports that she was in her usual state at home when she tripped over a potted plant, falling onto her left side. She experienced immediate pain involving the left hip, left shoulder, and left lateral chest wall. She denies hitting her head and denies any loss of consciousness. She reports that she was in her usual state just prior to the fall with no recent fevers or chills and no recent chest pain, palpitations, dyspnea, or cough. She describes severe chronic pain that is attributed to osteoarthritis which she manages with Tylenol and Percocet at home.   Clinical Impression  Pt has a sitter in the waking hours.  At night she is placed in bed by a sitter and remains there until the AM sitter comes in to assist her with her bathing and dressing.  She has a life alert at night.  Ms. Dawes appears to be close to her prior functional level of activity.  I will recommend Home health therapy going out to the house to educate sitters on the best ways to complete bed mobility to decrease stress on the patient and sitter.      Follow Up Recommendations Home health PT    Equipment Recommendations  None recommended by PT    Recommendations for Other Services       Precautions / Restrictions Precautions Precautions: Fall Restrictions Weight Bearing Restrictions: No      Mobility  Bed Mobility Overal bed mobility: Needs Assistance Bed Mobility: Supine to Sit;Sit to Supine     Supine to sit: Min assist Sit to supine: Min guard       Transfers Overall transfer level: Needs assistance Equipment used: Rolling walker (2 wheeled) Transfers: Stand Pivot Transfers   Stand pivot transfers: Supervision          Ambulation/Gait Ambulation/Gait assistance: Supervision Ambulation Distance (Feet): 20 Feet Assistive device: Rolling walker (2 wheeled)     Gait velocity interpretation: <1.8 ft/sec, indicative of risk for recurrent falls    Stairs            Wheelchair Mobility    Modified Rankin (Stroke Patients Only)       Balance                                             Pertinent Vitals/Pain Pain Assessment: No/denies pain (Pt has just had pain medication.  )    Home Living Family/patient expects to be discharged to:: Private residence Living Arrangements: Alone;Other (Comment) Available Help at Discharge: Personal care attendant Type of Home: House         Home Equipment: Gilford Rile - 2 wheels      Prior Function Level of Independence: Needs assistance   Gait / Transfers Assistance Needed: supervision  ADL's / Homemaking Assistance Needed: total        Hand Dominance        Extremity/Trunk Assessment        Lower Extremity Assessment Lower Extremity Assessment: Generalized weakness  Cervical / Trunk Assessment Cervical / Trunk Assessment: Kyphotic  Communication   Communication: No difficulties  Cognition Arousal/Alertness: Awake/alert Behavior During Therapy: WFL for tasks assessed/performed                        General Comments      Exercises General Exercises - Lower Extremity Ankle Circles/Pumps: Both;10 reps Quad Sets: Both;10 reps Heel Slides: Both;10 reps Hip ABduction/ADduction: Both;10 reps   Assessment/Plan    PT Assessment Patient needs continued PT services  PT Problem List Decreased strength;Decreased activity tolerance;Decreased balance;Decreased mobility;Pain          PT Treatment Interventions Gait  training;Functional mobility training;Therapeutic activities;Therapeutic exercise    PT Goals (Current goals can be found in the Care Plan section)  Acute Rehab PT Goals Patient Stated Goal: To go home PT Goal Formulation: With patient Time For Goal Achievement: 05/12/16 Potential to Achieve Goals: Good    Frequency Min 5X/week   Barriers to discharge        Co-evaluation               End of Session Equipment Utilized During Treatment: Gait belt Activity Tolerance: Patient tolerated treatment well;Patient limited by fatigue Patient left: in chair;with call bell/phone within reach;with nursing/sitter in room Nurse Communication: Mobility status         Time: 1130-1157 PT Time Calculation (min) (ACUTE ONLY): 27 min   Charges:   PT Evaluation $PT Eval Moderate Complexity: 1 Procedure     PT G Codes:   [id number] CM [id number] CM [id number] CM   Rayetta Humphrey, PT CLT [phone] 05/09/2016, 11:59 AM

## 2016-05-09 NOTE — ED Provider Notes (Signed)
[facility] DEPT Provider Note   CSN: [id number] Arrival date & time: 05/08/16  2338  By signing my name below, I, Higinio Plan, attest that this documentation has been prepared under the direction and in the presence of Sherwood Gambler, MD . Electronically Signed: Higinio Plan, Scribe. 05/09/2016. 12:15 AM.  History   Chief Complaint Chief Complaint  Patient presents with  . Fall   The history is provided by the patient. No language interpreter was used.   HPI Comments: [name] is a 82 y.o. female with PMHx of arthritis and generalized muscle weakness, brought in by ambulance to the Emergency Department complaining of gradually worsening, 8/10, left shoulder and hip pain s/p a fall that occurred a few minutes PTA. Pt reports she was walking in her home when she suddenly tripped over a potted plant on the ground, causing her to fall onto the left side of her body. She states associated left rib pain.  She denies any head injury or loss of consciousness.She notes she uses a walker to ambulate and was using it tonight at the time of her fall. She states she normally takes "pain pills" every 5 hours but states she has not taken any tonight. She denies chest pain, abdominal pain, numbness or weakness. She notes she currently lives by herself.   Past Medical History:  Diagnosis Date  . Anemia   . Arthritis   . Cancer ([place])    uterine surg only  . CHF (congestive heart failure) ([place])   . Colitis 12/31/2012   presumed C.Diff. patient declined colonoscopy  . Hypotension   . Kidney stone   . Muscle weakness (generalized)   . Osteoarthritis   . Pancreatic atrophy 12/31/2012  . Pneumonia     Patient Active Problem List   Diagnosis Date Noted  . Multiple rib fractures 05/09/2016  . Fall 05/09/2016  . Leukocytosis 05/09/2016  . Chest pain 03/13/2015  . Other specified fever   . Pain in the chest   . Abdominal pain 10/28/2014  . Anemia 10/28/2014  . Hypokalemia 10/28/2014  .  Protein-calorie malnutrition, severe ([place]) 10/28/2014  . Adynamic ileus ([place]) 10/28/2014  . Dyspnea 09/18/2014  . Nausea and vomiting 09/18/2014  . Cellulitis 09/02/2014  . Community acquired pneumonia 08/14/2014  . Acute respiratory failure with hypoxia ([place]) 08/13/2014  . Macrocytic anemia 08/13/2014  . Chronic diastolic CHF (congestive heart failure) ([place]) 08/13/2014  . CAP (community acquired pneumonia) 08/12/2014  . PUD (peptic ulcer disease) 10/05/2013  . Acute gastric ulcer due to Helicobacter pylori [id number]  . Right kidney stone 12/31/2012  . Hypotension 12/31/2012  . Pancreatic atrophy 12/31/2012    Past Surgical History:  Procedure Laterality Date  . ABDOMINAL HYSTERECTOMY    . CHOLECYSTECTOMY    . ESOPHAGOGASTRODUODENOSCOPY N/A 03/03/2013   RMR: Significant gastric ulcer without bleeding stigmata requiring no endoscopic intervention today. Noncritical Schatzki's ring. Small hiatal hernia  . ESOPHAGOGASTRODUODENOSCOPY N/A 08/24/2013   Dr. [name]: Hiatal hernia.  Gastric mucosal scar formation at site of previously noted gastric ulcerations  . JOINT REPLACEMENT    . LEFT KNEE SURGERY AND REVISION    . RIGHT HIP SURGERY      OB History    Gravida Para Term Preterm AB Living   3 2 2   1      SAB TAB Ectopic Multiple Live Births   1               Home Medications    Prior to Admission  medications   Medication Sig Start Date End Date Taking? Authorizing Provider  acetaminophen (TYLENOL) 500 MG tablet Take 500-1,000 mg by mouth every 6 (six) hours as needed for mild pain, moderate pain, fever or headache.    Yes Historical Provider, MD  albuterol (PROAIR HFA) 108 (90 BASE) MCG/ACT inhaler Inhale 1-2 puffs into the lungs every 6 (six) hours as needed for wheezing or shortness of breath.   Yes Historical Provider, MD  docusate sodium (COLACE) 100 MG capsule Take 100 mg by mouth 2 (two) times daily as needed for mild constipation.    Yes Historical Provider, MD    Multiple Vitamin (MULTIVITAMIN WITH MINERALS) TABS tablet Take 1 tablet by mouth daily.   Yes Historical Provider, MD  ondansetron (ZOFRAN) 4 MG tablet Take 4 mg by mouth every 6 (six) hours as needed for nausea or vomiting.   Yes Historical Provider, MD  oxyCODONE-acetaminophen (PERCOCET) 7.5-325 MG tablet Take 1 tablet by mouth every 4 (four) hours as needed for severe pain.   Yes Historical Provider, MD  pantoprazole (PROTONIX) 40 MG tablet Take 40 mg by mouth daily.    Yes Historical Provider, MD  polyethylene glycol (MIRALAX / GLYCOLAX) packet Take 17 g by mouth daily as needed for mild constipation.   Yes Historical Provider, MD    Family History Family History  Problem Relation Age of Onset  . Cancer Mother     Social History Social History  Substance Use Topics  . Smoking status: Former Smoker    Types: Cigarettes    Quit date: 04/25/1951  . Smokeless tobacco: Never Used  . Alcohol use No     Comment: 1 glass of wine a day- former     Allergies   Sulfa antibiotics and Aspirin   Review of Systems Review of Systems  Cardiovascular: Negative for chest pain.  Gastrointestinal: Negative for abdominal pain.  Musculoskeletal: Positive for arthralgias.  Neurological: Negative for weakness and numbness.   Physical Exam Updated Vital Signs BP 147/63 (BP Location: Right Arm)   Pulse 62   Temp 98.1 F (36.7 C) (Oral)   Resp 16   Ht 4\' 6"  (1.372 m)   Wt 72 lb (32.7 kg)   SpO2 98%   BMI 17.36 kg/m   Physical Exam  Constitutional: She is oriented to person, place, and time. She appears well-developed and well-nourished.  HENT:  Head: Normocephalic and atraumatic.  Right Ear: External ear normal.  Left Ear: External ear normal.  Nose: Nose normal.  Eyes: Right eye exhibits no discharge. Left eye exhibits no discharge.  Cardiovascular: Normal rate, regular rhythm and normal heart sounds.   Pulses:      Radial pulses are 2+ on the right side, and 2+ on the left  side.       Dorsalis pedis pulses are 2+ on the right side, and 2+ on the left side.  Pulmonary/Chest: Effort normal and breath sounds normal.  Mild left lower chest wall tenderness.   Abdominal: Soft. There is no tenderness.  Musculoskeletal:       Left shoulder: She exhibits decreased range of motion and tenderness.       Left elbow: She exhibits normal range of motion. No tenderness found.       Left hip: She exhibits tenderness. She exhibits normal range of motion.       Left knee: She exhibits normal range of motion. No tenderness found.       Left upper arm: She exhibits no tenderness.  Left upper leg: She exhibits no tenderness.       Legs: Neurological: She is alert and oriented to person, place, and time.  Skin: Skin is warm and dry.  Nursing note and vitals reviewed.  ED Treatments / Results  Labs (all labs ordered are listed, but only abnormal results are displayed) Labs Reviewed  CBC WITH DIFFERENTIAL/PLATELET - Abnormal; Notable for the following:       Result Value   WBC 13.6 (*)    RBC 3.49 (*)    Hemoglobin 11.4 (*)    HCT 34.2 (*)    Neutro Abs 10.0 (*)    Monocytes Absolute 1.3 (*)    All other components within normal limits  BASIC METABOLIC PANEL    EKG  EKG Interpretation None       Radiology Dg Ribs Unilateral W/chest Left  Result Date: 05/09/2016 CLINICAL DATA:  Tripped over plant at home, with left shoulder pain and left-sided chest pain. Initial encounter. EXAM: LEFT RIBS AND CHEST - 3+ VIEW COMPARISON:  Chest radiograph performed 03/13/2015 FINDINGS: There are mildly displaced fractures of the left lateral sixth through ninth ribs, of indeterminate age. Right convex lumbar scoliosis is noted. The lungs are difficult to fully assess due to the degree of scoliosis. Mild vascular congestion is noted. No pleural effusion or pneumothorax is seen. Degenerative change is seen at the glenohumeral joints bilaterally. The heart is mildly enlarged.  Postoperative change is noted at the mid abdomen. IMPRESSION: 1. Mildly displaced fractures of the left lateral sixth through ninth ribs, of indeterminate age. 2. Right convex lumbar scoliosis noted. 3. Mild vascular congestion and mild cardiomegaly. Electronically Signed   By: Garald Balding M.D.   On: 05/09/2016 01:38   Dg Shoulder Left  Result Date: 05/09/2016 CLINICAL DATA:  Tripped over plant in home and fell, with left shoulder pain. Initial encounter. EXAM: LEFT SHOULDER - 2+ VIEW COMPARISON:  Chest radiograph performed 03/13/2015 FINDINGS: There is no evidence of fracture or dislocation. The left humeral head is seated within the glenoid fossa. Degenerative change is noted at the left glenohumeral joint, with cortical irregularity, mild deformity of the left humeral head, and sclerosis at the glenoid fossa. Mild degenerative change is noted at the left acromioclavicular joint. Soft tissue swelling is noted overlying the left shoulder. The visualized portions of the left lung are clear. IMPRESSION: 1. No evidence of fracture or dislocation. 2. Chronic degenerative change at the left glenohumeral joint. Electronically Signed   By: Garald Balding M.D.   On: 05/09/2016 01:19   Dg Humerus Left  Result Date: 05/09/2016 CLINICAL DATA:  Tripped over plant in home and fell, with left upper arm pain. Initial encounter. EXAM: LEFT HUMERUS - 2+ VIEW COMPARISON:  None. FINDINGS: There is no evidence of fracture or dislocation. The left humerus appears intact. The left elbow joint is grossly unremarkable in appearance. Degenerative change is noted at the left glenohumeral joint, with sclerosis and joint space narrowing. Associated osteophyte formation and mild chronic deformity of the left humeral head is noted. Soft tissue swelling is noted overlying the left shoulder. IMPRESSION: 1. No evidence of fracture or dislocation. 2. Degenerative change at the left glenohumeral joint. Electronically Signed   By:  Garald Balding M.D.   On: 05/09/2016 01:17   Dg Hip Unilat With Pelvis 2-3 Views Left  Result Date: 05/09/2016 CLINICAL DATA:  Tripped over plant at home and fell, with left hip pain. Initial encounter. EXAM: DG HIP (WITH OR WITHOUT PELVIS)  2-3V LEFT COMPARISON:  Left hip radiographs performed 09/18/2014 FINDINGS: There is no evidence of fracture or dislocation. Mild left convex lumbar scoliosis is noted. There is chronic deformity of the left superior and inferior pubic rami. 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 The arthroplasty is otherwise unremarkable in appearance. The left hip joint is unremarkable in appearance. The sacroiliac joints are unremarkable in appearance. The visualized bowel gas pattern is grossly unremarkable in appearance. Scattered phleboliths are noted within the pelvis. IMPRESSION: 1. No evidence of fracture or dislocation. 2. 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 Arthroplasty otherwise unremarkable in appearance. Electronically Signed   By: Garald Balding M.D.   On: 05/09/2016 01:32    Procedures Procedures (including critical care time)  Medications Ordered in ED Medications  docusate sodium (COLACE) capsule 100 mg (not administered)  pantoprazole (PROTONIX) EC tablet 40 mg (not administered)  albuterol (PROVENTIL) (2.5 MG/3ML) 0.083% nebulizer solution 3 mL (not administered)  polyethylene glycol (MIRALAX / GLYCOLAX) packet 17 g (not administered)  oxyCODONE-acetaminophen (PERCOCET/ROXICET) 5-325 MG per tablet 1 tablet (not administered)  multivitamin with minerals tablet 1 tablet (not administered)  acetaminophen (TYLENOL) tablet 500 mg (not administered)  ondansetron (ZOFRAN) tablet 4 mg (not administered)    Or  ondansetron (ZOFRAN) injection 4 mg (not administered)  HYDROmorphone (DILAUDID) injection 0.5 mg (0.5 mg Intravenous Given 05/09/16 0818)    enoxaparin (LOVENOX) injection 30 mg (not administered)  fentaNYL (SUBLIMAZE) injection 50 mcg (50 mcg Intravenous Given 05/09/16 0158)    DIAGNOSTIC STUDIES:  Oxygen Saturation is 98% on RA, normal by my interpretation.    COORDINATION OF CARE:  12:13 AM Discussed treatment plan with pt at bedside and pt agreed to plan.  Initial Impression / Assessment and Plan / ED Course  I have reviewed the triage vital signs and the nursing notes.  Pertinent labs & imaging results that were available during my care of the patient were reviewed by me and considered in my medical decision making (see chart for details).  Clinical Course as of May 09 848  Fri May 09, 2016  0015 Xrays, fentanyl for pain. No headache or head trauma.  [SG]  0222 Pain is moderately improved after IV fentanyl. X-ray of shoulder and hip are negative.  [SG]  0404 Dr Myna Hidalgo to admit to med-surg obs  [SG]    Clinical Course User Index [SG] Sherwood Gambler, MD    Mechanical fall with left arm, chest wall and hip pain. No signs of fracture of arm or hip. However with ribs broken in area she's tender with only moderate pain control I think she needs admission. She lives at home alone.  I personally performed the services described in this documentation, which was scribed in my presence. The recorded information has been reviewed and is accurate.   Final Clinical Impressions(s) / ED Diagnoses   Final diagnoses:  Fall, initial encounter  Closed fracture of multiple ribs of left side, initial encounter    New Prescriptions Current Discharge Medication List       Sherwood Gambler, MD 05/09/16 [phone]

## 2016-05-09 NOTE — Progress Notes (Signed)
Patient with orders to be discharge home. Discharge instructions given, patient and caregiver verbalized understanding. Patient stable. Patient left in private vehicle with caregiver.

## 2016-05-09 NOTE — ED Notes (Signed)
Report given to [facility]

## 2016-06-16 IMAGING — DX DG CHEST 1V
1 series · 1 of 1 positions shown · non-contrast
Comparison: Portable chest x-ray August 12, 2014 and July 29, 2013.

CLINICAL DATA: Shortness of breath, cough, and congestion,
community-acquired pneumonia.

EXAM:
CHEST  1 VIEW

[chest ap]
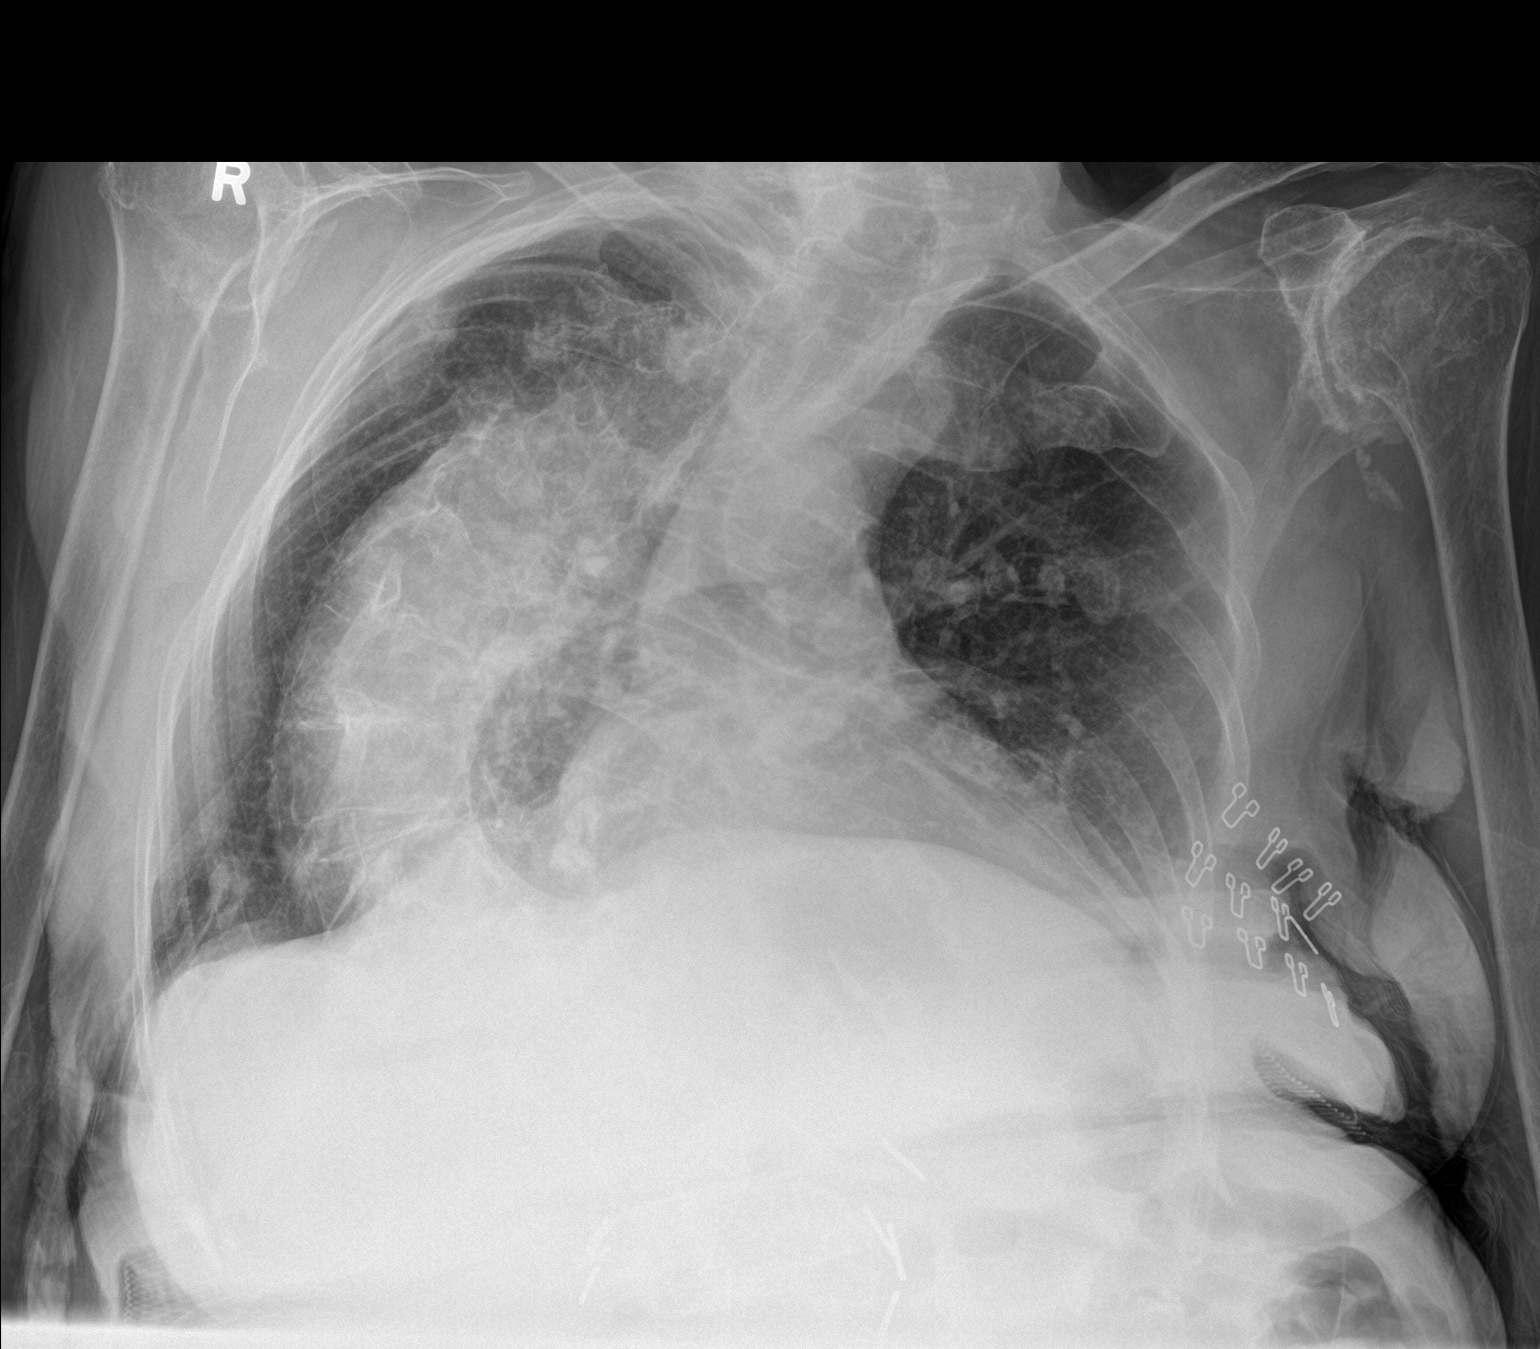

[1 of 1 positions shown; findings below may reference images not displayed]

FINDINGS: There is severe distortion of the thoracic cage due to marked
dextroscoliosis. The lungs are adequately inflated. There is no
focal infiltrate. There is no pleural effusion. The cardiac
silhouette where visualized is normal in size. The pulmonary
vascularity is not engorged. There is severe degenerative change of
the left shoulder. There are chronic rib deformities on the right.
IMPRESSION: There is no pulmonary edema, pneumonia, nor other acute
cardiopulmonary abnormality.

## 2016-08-16 ENCOUNTER — Emergency Department (HOSPITAL_COMMUNITY)
Admission: EM | Admit: 2016-08-16 | Discharge: 2016-08-16 | Disposition: A | Discharge status: Home / Self Care | Payer: Medicare Other | Attending: Emergency Medicine | Admitting: Emergency Medicine

## 2016-08-16 ENCOUNTER — Emergency Department (HOSPITAL_COMMUNITY): Payer: Medicare Other

## 2016-08-16 ENCOUNTER — Encounter (HOSPITAL_COMMUNITY): Payer: Self-pay | Admitting: *Deleted

## 2016-08-16 DIAGNOSIS — K644 Residual hemorrhoidal skin tags: Secondary | ICD-10-CM | POA: Insufficient documentation

## 2016-08-16 DIAGNOSIS — R630 Anorexia: Secondary | ICD-10-CM | POA: Diagnosis not present

## 2016-08-16 DIAGNOSIS — R1013 Epigastric pain: Secondary | ICD-10-CM | POA: Insufficient documentation

## 2016-08-16 DIAGNOSIS — Z79899 Other long term (current) drug therapy: Secondary | ICD-10-CM | POA: Insufficient documentation

## 2016-08-16 DIAGNOSIS — I5032 Chronic diastolic (congestive) heart failure: Secondary | ICD-10-CM | POA: Diagnosis not present

## 2016-08-16 DIAGNOSIS — K5900 Constipation, unspecified: Secondary | ICD-10-CM | POA: Diagnosis not present

## 2016-08-16 DIAGNOSIS — Z87891 Personal history of nicotine dependence: Secondary | ICD-10-CM | POA: Insufficient documentation

## 2016-08-16 DIAGNOSIS — R0789 Other chest pain: Secondary | ICD-10-CM | POA: Diagnosis present

## 2016-08-16 LAB — CBC WITH DIFFERENTIAL/PLATELET
Basophils Absolute: 0 10*3/uL (ref 0.0–0.1)
Basophils Relative: 0 %
Eosinophils Absolute: 0 10*3/uL (ref 0.0–0.7)
Eosinophils Relative: 0 %
HCT: 35.7 % — ABNORMAL LOW (ref 36.0–46.0)
Hemoglobin: 12 g/dL (ref 12.0–15.0)
LYMPHS ABS: 1.4 10*3/uL (ref 0.7–4.0)
LYMPHS PCT: 18 %
MCH: 32.3 pg (ref 26.0–34.0)
MCHC: 33.6 g/dL (ref 30.0–36.0)
MCV: 96 fL (ref 78.0–100.0)
Monocytes Absolute: 0.4 10*3/uL (ref 0.1–1.0)
Monocytes Relative: 5 %
Neutro Abs: 5.6 10*3/uL (ref 1.7–7.7)
Neutrophils Relative %: 77 %
Platelets: 231 10*3/uL (ref 150–400)
RBC: 3.72 MIL/uL — AB (ref 3.87–5.11)
RDW: 13.8 % (ref 11.5–15.5)
WBC: 7.4 10*3/uL (ref 4.0–10.5)

## 2016-08-16 LAB — URINALYSIS, ROUTINE W REFLEX MICROSCOPIC
Bilirubin Urine: NEGATIVE
Glucose, UA: NEGATIVE mg/dL
Ketones, ur: 20 mg/dL — AB
Leukocytes, UA: NEGATIVE
NITRITE: NEGATIVE
PROTEIN: NEGATIVE mg/dL
SPECIFIC GRAVITY, URINE: 1.013 (ref 1.005–1.030)
pH: 5 (ref 5.0–8.0)

## 2016-08-16 LAB — TROPONIN I

## 2016-08-16 LAB — COMPREHENSIVE METABOLIC PANEL
ALK PHOS: 59 U/L (ref 38–126)
ALT: 11 U/L — AB (ref 14–54)
AST: 20 U/L (ref 15–41)
Albumin: 4.2 g/dL (ref 3.5–5.0)
Anion gap: 14 (ref 5–15)
BUN: 14 mg/dL (ref 6–20)
CALCIUM: 9.4 mg/dL (ref 8.9–10.3)
CO2: 20 mmol/L — ABNORMAL LOW (ref 22–32)
Chloride: 102 mmol/L (ref 101–111)
Creatinine, Ser: 0.58 mg/dL (ref 0.44–1.00)
GFR calc Af Amer: >60 mL/min (ref >60)
Glucose, Bld: 72 mg/dL (ref 65–99)
Potassium: 3.4 mmol/L — ABNORMAL LOW (ref 3.5–5.1)
Sodium: 136 mmol/L (ref 135–145)
TOTAL PROTEIN: 6.8 g/dL (ref 6.5–8.1)
Total Bilirubin: 1.3 mg/dL — ABNORMAL HIGH (ref 0.3–1.2)

## 2016-08-16 LAB — BRAIN NATRIURETIC PEPTIDE: B Natriuretic Peptide: 130 pg/mL — ABNORMAL HIGH (ref 0.0–100.0)

## 2016-08-16 LAB — POC OCCULT BLOOD, ED: Fecal Occult Bld: NEGATIVE

## 2016-08-16 LAB — LIPASE, BLOOD: Lipase: 25 U/L (ref 11–51)

## 2016-08-16 MED ORDER — SODIUM CHLORIDE 0.9 % IV BOLUS (SEPSIS)
1000.0000 mL | Freq: Once | INTRAVENOUS | Status: AC
  Administered 2016-08-16: 1000 mL via INTRAVENOUS

## 2016-08-16 MED ORDER — ALUM & MAG HYDROXIDE-SIMETH 200-200-20 MG/5ML PO SUSP: 15.0000 mL | Freq: Four times a day (QID) | ORAL | 0 refills | Status: AC | PRN

## 2016-08-16 MED ORDER — GI COCKTAIL ~~LOC~~
30.0000 mL | Freq: Once | ORAL | Status: AC
  Administered 2016-08-16: 30 mL via ORAL
  Filled 2016-08-16: qty 30

## 2016-08-16 MED ORDER — RANITIDINE HCL 150 MG PO TABS: 150.0000 mg | ORAL_TABLET | Freq: Two times a day (BID) | ORAL | 0 refills | Status: DC

## 2016-08-16 NOTE — Discharge Instructions (Signed)
Your lab work came back normal today. Your chest x-ray and EKG were also normal and reassuring. Given your history of peptic ulcer disease and upper abdominal burning discomfort I suspect that your symptoms are most likely due to acid reflux or possibly an ulcer.  You have been given 2 new prescriptions to help treat your symptoms. Please take ranitidine and Maalox as prescribed. Please see attached information on food choices for acid reflux. I recommended that you follow-up with your gastroenterologist within 7 days for reevaluation.  Return to the emergency department if your chest tightness is associated with exertion, nausea, vomiting or becomes concerning in any way to you.

## 2016-08-16 NOTE — ED Triage Notes (Signed)
Reported that pt lives at home with caregivers, with constipation for 5 days and decrease in appetite.  Reported that mag citrate and suppositories were tried with no results.  Ems reports that pt NSR at rate of 92, 154/ 72, 94% RA.

## 2016-08-16 NOTE — ED Notes (Signed)
Patient given Ginger Ale at this time.

## 2016-08-16 NOTE — ED Provider Notes (Signed)
The pt is 89, chronic opiates and constipation - went cold [place] 2 days ago off oxycodone - no gross blood or impaction - has no abd ttp.  Labs unremarkable thus far, needs better GI regimen for constipation - miralax / PCP f/u Pt and family in agreement. Vitals without concern - not tachypneic on my exam  Medical screening examination/treatment/procedure(s) were conducted as a shared visit with non-physician practitioner(s) and myself.  I personally evaluated the patient during the encounter.  Clinical Impression:   Final diagnoses:  Abdominal discomfort, epigastric         Martha Chapel, MD 08/16/16 [phone]

## 2016-08-16 NOTE — ED Notes (Signed)
Patient transported to X-ray 

## 2016-08-16 NOTE — ED Provider Notes (Signed)
[facility] DEPT Provider Note   CSN: [id number] Arrival date & time: 08/16/16  1108     History   Chief Complaint Chief Complaint  Patient presents with  . Constipation    HPI Tashai RAYMIE TRANI is a 83 y.o. female with pertinent pmh of chronic anemia, chronic systolic CHF (LV EF 73-41% in 2014), chronic pain on daily percocet, PUD (gastric ulcer), treated H. Pylori and surgical hx of cholecystectomy and uterine surgery for uterine cancer presents to ED with two concerns.  History provided by patient and caretaker at bedside.   Caretaker notes patient does not look "right".  Patient has been complaining of not feeling well since yesterday. One episode of reported central chest "tightness" and "burning" last night associated with facial flushing, lasting seconds.  Decreased fluid and food intake last night.  Patient has been spitting up "phlegm", which care taker says she sometimes does when she is not feeling well.  No fevers, cough, URI symptoms, nausea, vomiting, urinary symptoms.    Patient reports rectal pain and upper "stomach burning" and abdominal soreness.  Patient had gone 5 days without BM or flatus, took stool softener last night and had non bloody, non mucoid BM.  Patient takes daily percocet for chronic pain.  Takes miralax daily, however caretaker reports patient lives alone and may have not taken miralax for a couple of days.     HPI  Past Medical History:  Diagnosis Date  . Anemia   . Arthritis   . Cancer ([place])    uterine surg only  . CHF (congestive heart failure) ([place])   . Colitis 12/31/2012   presumed C.Diff. patient declined colonoscopy  . Hypotension   . Kidney stone   . Muscle weakness (generalized)   . Osteoarthritis   . Pancreatic atrophy 12/31/2012  . Pneumonia     Patient Active Problem List   Diagnosis Date Noted  . Multiple rib fractures 05/09/2016  . Fall 05/09/2016  . Leukocytosis 05/09/2016  . Chest pain 03/13/2015  . Other specified  fever   . Pain in the chest   . Abdominal pain 10/28/2014  . Anemia 10/28/2014  . Hypokalemia 10/28/2014  . Protein-calorie malnutrition, severe ([place]) 10/28/2014  . Adynamic ileus ([place]) 10/28/2014  . Dyspnea 09/18/2014  . Nausea and vomiting 09/18/2014  . Cellulitis 09/02/2014  . Community acquired pneumonia 08/14/2014  . Acute respiratory failure with hypoxia ([place]) 08/13/2014  . Macrocytic anemia 08/13/2014  . Chronic diastolic CHF (congestive heart failure) ([place]) 08/13/2014  . CAP (community acquired pneumonia) 08/12/2014  . PUD (peptic ulcer disease) 10/05/2013  . Acute gastric ulcer due to Helicobacter pylori 93/79/0240  . Right kidney stone 12/31/2012  . Hypotension 12/31/2012  . Pancreatic atrophy 12/31/2012    Past Surgical History:  Procedure Laterality Date  . ABDOMINAL HYSTERECTOMY    . CHOLECYSTECTOMY    . ESOPHAGOGASTRODUODENOSCOPY N/A 03/03/2013   RMR: Significant gastric ulcer without bleeding stigmata requiring no endoscopic intervention today. Noncritical Schatzki's ring. Small hiatal hernia  . ESOPHAGOGASTRODUODENOSCOPY N/A 08/24/2013   Dr. [name]: Hiatal hernia.  Gastric mucosal scar formation at site of previously noted gastric ulcerations  . JOINT REPLACEMENT    . LEFT KNEE SURGERY AND REVISION    . RIGHT HIP SURGERY      OB History    Gravida Para Term Preterm AB Living   3 2 2   1      SAB TAB Ectopic Multiple Live Births   1  Home Medications    Prior to Admission medications   Medication Sig Start Date End Date Taking? Authorizing Provider  acetaminophen (TYLENOL) 500 MG tablet Take 500-1,000 mg by mouth every 6 (six) hours as needed for mild pain, moderate pain, fever or headache.    Yes Historical Provider, MD  albuterol (PROAIR HFA) 108 (90 BASE) MCG/ACT inhaler Inhale 1-2 puffs into the lungs every 6 (six) hours as needed for wheezing or shortness of breath.   Yes Historical Provider, MD  ALPRAZolam Duanne Moron) 0.25 MG tablet Take  0.5 tablets by mouth daily as needed for anxiety. 06/10/16  Yes Historical Provider, MD  docusate sodium (COLACE) 100 MG capsule Take 100 mg by mouth 2 (two) times daily as needed for mild constipation.    Yes Historical Provider, MD  gabapentin (NEURONTIN) 100 MG capsule Take 1 capsule by mouth at bedtime. 06/10/16  Yes Historical Provider, MD  Multiple Vitamin (MULTIVITAMIN WITH MINERALS) TABS tablet Take 1 tablet by mouth daily.   Yes Historical Provider, MD  ondansetron (ZOFRAN) 4 MG tablet Take 4 mg by mouth every 6 (six) hours as needed for nausea or vomiting.   Yes Historical Provider, MD  oxyCODONE-acetaminophen (PERCOCET) 7.5-325 MG tablet Take 1 tablet by mouth every 4 (four) hours as needed for severe pain.   Yes Historical Provider, MD  pantoprazole (PROTONIX) 40 MG tablet Take 40 mg by mouth daily.    Yes Historical Provider, MD  polyethylene glycol (MIRALAX / GLYCOLAX) packet Take 17 g by mouth daily as needed for mild constipation.   Yes Historical Provider, MD  alum & mag hydroxide-simeth (MAALOX REGULAR STRENGTH) 200-200-20 MG/5ML suspension Take 15 mLs by mouth every 6 (six) hours as needed for indigestion or heartburn. 08/16/16   Kinnie Feil, PA-C  ranitidine (ZANTAC) 150 MG tablet Take 1 tablet (150 mg total) by mouth 2 (two) times daily. 08/16/16   Kinnie Feil, PA-C    Family History Family History  Problem Relation Age of Onset  . Cancer Mother     Social History Social History  Substance Use Topics  . Smoking status: Former Smoker    Types: Cigarettes    Quit date: 04/25/1951  . Smokeless tobacco: Never Used  . Alcohol use No     Comment: 1 glass of wine a day- former     Allergies   Sulfa antibiotics and Aspirin   Review of Systems Review of Systems  Constitutional: Positive for appetite change. Negative for activity change and fever.  HENT: Negative for congestion, sneezing and sore throat.   Respiratory: Positive for chest tightness. Negative for  cough, choking and shortness of breath.   Cardiovascular: Negative for chest pain and leg swelling.  Gastrointestinal: Positive for abdominal pain and constipation. Negative for blood in stool, diarrhea, nausea and vomiting.  Genitourinary: Negative for difficulty urinating, dysuria and hematuria.  Skin: Negative for wound.  Neurological: Negative for headaches.     Physical Exam Updated Vital Signs BP 116/62   Pulse 62   Temp 98.3 F (36.8 C) (Oral)   Resp 18   Ht 4\' 6"  (1.372 m)   Wt 32.7 kg   SpO2 99%   BMI 17.36 kg/m   Physical Exam  Constitutional: She is oriented to person, place, and time. Vital signs are normal. She is cooperative. She is easily aroused.  Non-toxic appearance.  Elderly, frail woman. Cooperative.   HENT:  Head: Normocephalic and atraumatic.  Nose: Nose normal.  Mouth/Throat: No oropharyngeal exudate or posterior  oropharyngeal erythema.  Moist mucous membranes  Eyes: Conjunctivae and EOM are normal. Pupils are equal, round, and reactive to light.  Lids normal, symmetric EOMS and PERRL intact bilaterally  Neck: Normal range of motion. Neck supple. No JVD present.  No cervical adenopathy  Cardiovascular: Normal rate, regular rhythm, S1 normal, S2 normal, normal heart sounds and intact distal pulses.   No murmur heard. RRR, normal S1 and S2, no murmurs, no pleuritic rub Radial and DP pulses 2+, symmetric, bilaterally No LE edema  Pulmonary/Chest: Effort normal and breath sounds normal. No respiratory distress. She has no wheezes. She has no rales. She exhibits no tenderness.  Lungs CTAB No chest wall tenderness  Abdominal: Soft. Bowel sounds are normal. She exhibits no distension and no mass. There is tenderness in the epigastric area.  No surgical abdominal scars noted.  No pulsating masses.  Normal bowel sounds throughout.  + Epigastric abdomen mildly tender otherwise abdomen is soft, without distention, rigidity, guarding or rebound.  No  suprapubic tenderness. No CVAT.  Negative Murphy's. Negative McBurney's.  Non palpable kidneys. No hepatosplenomegaly.   Genitourinary: Rectal exam shows external hemorrhoid and tenderness. Pelvic exam was performed with patient in the knee-chest position.  Genitourinary Comments: Chaperone was present.  Patient with burning pain in perianal/rectal area with palpation. + 2 external hemorrhoids at 11 and 12 o'clock without bleeding or thrombosis No external fissures.  No induration or swelling of the perianal skin.  Patient able to tolerate examination.  I was able to feel the first 2-3cm of the rectum digitally without gross abnormalities or masses.  Stool color is brown with no gross blood noted.   No signs of perirectal abscess or fecal impaction.   DRE reveals good sphincter tone.    Musculoskeletal: Normal range of motion. She exhibits no deformity.  Lymphadenopathy:    She has no cervical adenopathy.  Neurological: She is alert, oriented to person, place, and time and easily aroused. No sensory deficit.  Alert and oriented to self and place  Skin: Skin is warm and dry. Capillary refill takes less than 2 seconds.  Psychiatric: She has a normal mood and affect. Her behavior is normal. Judgment and thought content normal.  Nursing note and vitals reviewed.    ED Treatments / Results  Labs (all labs ordered are listed, but only abnormal results are displayed) Labs Reviewed  CBC WITH DIFFERENTIAL/PLATELET - Abnormal; Notable for the following:       Result Value   RBC 3.72 (*)    HCT 35.7 (*)    All other components within normal limits  COMPREHENSIVE METABOLIC PANEL - Abnormal; Notable for the following:    Potassium 3.4 (*)    CO2 20 (*)    ALT 11 (*)    Total Bilirubin 1.3 (*)    All other components within normal limits  URINALYSIS, ROUTINE W REFLEX MICROSCOPIC - Abnormal; Notable for the following:    Hgb urine dipstick LARGE (*)    Ketones, ur 20 (*)    Bacteria, UA  RARE (*)    Squamous Epithelial / LPF 0-5 (*)    All other components within normal limits  BRAIN NATRIURETIC PEPTIDE - Abnormal; Notable for the following:    B Natriuretic Peptide 130.0 (*)    All other components within normal limits  URINE CULTURE  LIPASE, BLOOD  TROPONIN I  OCCULT BLOOD X 1 CARD TO LAB, STOOL  POC OCCULT BLOOD, ED    EKG  EKG Interpretation None  Radiology Dg Chest 2 View  Result Date: 08/16/2016 CLINICAL DATA:  83 year old female with a history of constipation for 5 days and decreased appetite. EXAM: CHEST  2 VIEW COMPARISON:  Chest x-ray 05/09/2016, 03/13/2015 FINDINGS: Re- demonstration of significant right apex scoliotic curvature of the thoracic spine. Aortic calcifications. Degenerative changes bilateral shoulders. Diffuse osteopenia.  No displaced fracture. Aeration of the bilateral lungs is similar to comparison chest x-rays. No new focal consolidation identified. No evidence of pneumothorax. No large pleural effusion. Interstitial opacities, similar to comparison studies. IMPRESSION: Chronic lung changes, similar to comparison studies, with no radiographic evidence of acute cardiopulmonary disease. Re- demonstration of significant right apex scoliotic curvature of the thoracic spine. Aortic atherosclerosis. Electronically Signed   By: Corrie Mckusick D.O.   On: 08/16/2016 12:57    Procedures Procedures (including critical care time)  Medications Ordered in ED Medications  sodium chloride 0.9 % bolus 1,000 mL (1,000 mLs Intravenous New Bag/Given 08/16/16 1329)  gi cocktail (Maalox,Lidocaine,Donnatal) (30 mLs Oral Given 08/16/16 1325)     Initial Impression / Assessment and Plan / ED Course  I have reviewed the triage vital signs and the nursing notes.  Pertinent labs & imaging results that were available during my care of the patient were reviewed by me and considered in my medical decision making (see chart for details).  Clinical Course as of Aug 16 1449  Sat Aug 16, 2016  1355 Discussed lab results with patient and caretaker at bedside. GI cocktail given, will do a by mouth challenge. Anticipate discharge with PUD treatment and GI follow-up. Patient and caretaker at bedside are agreeable.  [CG]    Clinical Course User Index [CG] Kinnie Feil, PA-C   83 year old female with pertinent pmh of chronic anemia, chronic systolic CHF (LV EF 16-01% in 2014), chronic pain on daily percocet, PUD (gastric ulcer), treated H. Pylori presents with burning, intermittent, epigastric abdominal discomfort that radiates to the substernal area. Patient does have history of gastric ulcer and treated H. Pylori.  On exam patient is frail, nontoxic appearing, alert and oriented to self and place. Vital signs are within normal limits. There is mild epigastric tenderness with direct palpation. Initial differential diagnosis includes ACS, GERD, PUD and less likely CHF, pneumonia and dissection. Patient also reports constipation for 5 days however had successful bowel movement this morning.  Labwork is reassuring. Electrolytes are within normal limits. No leukocytosis, no anemia, no nitrites on urinalysis. LFTs and lipase within normal limits. Hemoccult negative for blood. Troponin 1, chest x-ray and EKG are within normal limits without mediastinum enlargement, consolidation and nonischemic. BNP only mildly elevated but non-concerning at 130. Patient has remained hemodynamically stable in the ED. Patient was given GI cocktail and tolerated fluid challenge in the ED, denies epigastric abdominal discomfort.  Has ambulated to bedside commode without difficulty.  Given reassuring lab work, benign physical exam and successful fluid challenge I think patient is safe for discharge at this time. I suspect her symptoms are due to acid reflux or possible PUD. Patient will be discharged with Zantac, PPI, Maalox, information on GERD diet, and GI follow-up in the next week. She  really has GI follow-up established at [facility]. Discussed plan to discharge with caretaker and patient, both are agreeable to plan. ED return precautions given.  Patient, ED treatment and discharge plan was discussed with supervising physician who also evaluated the patient and is agreeable with plan.   Final Clinical Impressions(s) / ED Diagnoses  Final diagnoses:  Abdominal discomfort, epigastric    New Prescriptions New Prescriptions   ALUM & MAG HYDROXIDE-SIMETH (MAALOX REGULAR STRENGTH) 200-200-20 MG/5ML SUSPENSION    Take 15 mLs by mouth every 6 (six) hours as needed for indigestion or heartburn.   RANITIDINE (ZANTAC) 150 MG TABLET    Take 1 tablet (150 mg total) by mouth 2 (two) times daily.     Kinnie Feil, PA-C 08/16/16 1451    Noemi Chapel, MD 08/16/16 [phone]

## 2016-08-18 LAB — URINE CULTURE: Culture: NO GROWTH

## 2017-02-26 ENCOUNTER — Encounter (HOSPITAL_COMMUNITY): Payer: Self-pay | Admitting: *Deleted

## 2017-02-26 ENCOUNTER — Emergency Department (HOSPITAL_COMMUNITY)
Admission: EM | Admit: 2017-02-26 | Discharge: 2017-02-26 | Disposition: A | Discharge status: Home / Self Care | Payer: Medicare Other | Attending: Emergency Medicine | Admitting: Emergency Medicine

## 2017-02-26 ENCOUNTER — Emergency Department (HOSPITAL_COMMUNITY): Payer: Medicare Other

## 2017-02-26 DIAGNOSIS — I5042 Chronic combined systolic (congestive) and diastolic (congestive) heart failure: Secondary | ICD-10-CM | POA: Diagnosis not present

## 2017-02-26 DIAGNOSIS — R531 Weakness: Secondary | ICD-10-CM | POA: Insufficient documentation

## 2017-02-26 DIAGNOSIS — R42 Dizziness and giddiness: Secondary | ICD-10-CM | POA: Diagnosis present

## 2017-02-26 DIAGNOSIS — E860 Dehydration: Secondary | ICD-10-CM | POA: Insufficient documentation

## 2017-02-26 DIAGNOSIS — Z87891 Personal history of nicotine dependence: Secondary | ICD-10-CM | POA: Diagnosis not present

## 2017-02-26 DIAGNOSIS — Z79899 Other long term (current) drug therapy: Secondary | ICD-10-CM | POA: Insufficient documentation

## 2017-02-26 LAB — URINALYSIS, COMPLETE (UACMP) WITH MICROSCOPIC
Bacteria, UA: NONE SEEN
Bilirubin Urine: NEGATIVE
GLUCOSE, UA: NEGATIVE mg/dL
Ketones, ur: NEGATIVE mg/dL
Leukocytes, UA: NEGATIVE
Nitrite: NEGATIVE
PROTEIN: NEGATIVE mg/dL
Specific Gravity, Urine: 1.015 (ref 1.005–1.030)
pH: 5 (ref 5.0–8.0)

## 2017-02-26 LAB — COMPREHENSIVE METABOLIC PANEL
ALBUMIN: 3.7 g/dL (ref 3.5–5.0)
ALT: 7 U/L — ABNORMAL LOW (ref 14–54)
ANION GAP: 6 (ref 5–15)
AST: 17 U/L (ref 15–41)
Alkaline Phosphatase: 48 U/L (ref 38–126)
BILIRUBIN TOTAL: 0.6 mg/dL (ref 0.3–1.2)
BUN: 16 mg/dL (ref 6–20)
CO2: 29 mmol/L (ref 22–32)
Calcium: 9.1 mg/dL (ref 8.9–10.3)
Chloride: 102 mmol/L (ref 101–111)
Creatinine, Ser: 0.65 mg/dL (ref 0.44–1.00)
GFR calc Af Amer: >60 mL/min (ref >60)
GFR calc non Af Amer: >60 mL/min (ref >60)
GLUCOSE: 103 mg/dL — AB (ref 65–99)
POTASSIUM: 4.1 mmol/L (ref 3.5–5.1)
SODIUM: 137 mmol/L (ref 135–145)
Total Protein: 6.1 g/dL — ABNORMAL LOW (ref 6.5–8.1)

## 2017-02-26 LAB — CBC WITH DIFFERENTIAL/PLATELET
BASOS ABS: 0 10*3/uL (ref 0.0–0.1)
Basophils Relative: 0 %
EOS ABS: 0.2 10*3/uL (ref 0.0–0.7)
EOS PCT: 3 %
HCT: 33.6 % — ABNORMAL LOW (ref 36.0–46.0)
Hemoglobin: 11.1 g/dL — ABNORMAL LOW (ref 12.0–15.0)
LYMPHS PCT: 26 %
Lymphs Abs: 1.9 10*3/uL (ref 0.7–4.0)
MCH: 32.2 pg (ref 26.0–34.0)
MCHC: 33 g/dL (ref 30.0–36.0)
MCV: 97.4 fL (ref 78.0–100.0)
MONO ABS: 0.6 10*3/uL (ref 0.1–1.0)
MONOS PCT: 9 %
NEUTROS ABS: 4.5 10*3/uL (ref 1.7–7.7)
Neutrophils Relative %: 62 %
PLATELETS: 199 10*3/uL (ref 150–400)
RBC: 3.45 MIL/uL — AB (ref 3.87–5.11)
RDW: 14.2 % (ref 11.5–15.5)
WBC: 7.2 10*3/uL (ref 4.0–10.5)

## 2017-02-26 LAB — CBG MONITORING, ED: Glucose-Capillary: 91 mg/dL (ref 65–99)

## 2017-02-26 LAB — TROPONIN I: Troponin I: <0.03 ng/mL (ref <0.03)

## 2017-02-26 MED ORDER — SODIUM CHLORIDE 0.9 % IV BOLUS (SEPSIS)
500.0000 mL | Freq: Once | INTRAVENOUS | Status: AC
  Administered 2017-02-26: 500 mL via INTRAVENOUS

## 2017-02-26 MED ORDER — SODIUM CHLORIDE 0.9 % IV BOLUS (SEPSIS)
1000.0000 mL | Freq: Once | INTRAVENOUS | Status: AC
  Administered 2017-02-26: 1000 mL via INTRAVENOUS

## 2017-02-26 NOTE — ED Notes (Signed)
Pt ambulated in hallway with nursing assist. Pt reports dizziness upon immediately standing, but denies dizziness while ambulating.

## 2017-02-26 NOTE — ED Provider Notes (Signed)
Emergency Department Provider Note   I have reviewed the triage vital signs and the nursing notes.   HISTORY  Chief Complaint Dizziness and Weakness   HPI [name] is a 83 y.o. female history of anemia, arthritis, cancer, CHF and colitis the presents to the emergency department today with 5-7 days of progressively increasing dizziness and weakness. Patient states she's never had anything quite like this before usually gets better. She feels like she is on a ride and things are moving outside of her head. She is not having associated chest pain, headache, vision changes, neurologic changes associated with it. She does have chronic extremity pain secondary to arthritis for which she takes oxycodone and is also constipated but that is chronic issues.per the nursing notes patient is able to ambulate without difficulty from the toilet to an EMS stretcher. At this time patient's asymptomatic. She denies any recent fever, urinary symptoms, rashes or falls.   Past Medical History:  Diagnosis Date  . Anemia   . Arthritis   . Cancer ([place])    uterine surg only  . CHF (congestive heart failure) ([place])   . Colitis 12/31/2012   presumed C.Diff. patient declined colonoscopy  . Hypotension   . Kidney stone   . Muscle weakness (generalized)   . Osteoarthritis   . Pancreatic atrophy 12/31/2012  . Pneumonia     Patient Active Problem List   Diagnosis Date Noted  . Multiple rib fractures 05/09/2016  . Fall 05/09/2016  . Leukocytosis 05/09/2016  . Chest pain 03/13/2015  . Other specified fever   . Pain in the chest   . Abdominal pain 10/28/2014  . Anemia 10/28/2014  . Hypokalemia 10/28/2014  . Protein-calorie malnutrition, severe ([place]) 10/28/2014  . Adynamic ileus ([place]) 10/28/2014  . Dyspnea 09/18/2014  . Nausea and vomiting 09/18/2014  . Cellulitis 09/02/2014  . Community acquired pneumonia 08/14/2014  . Acute respiratory failure with hypoxia ([place]) 08/13/2014  . Macrocytic  anemia 08/13/2014  . Chronic diastolic CHF (congestive heart failure) ([place]) 08/13/2014  . CAP (community acquired pneumonia) 08/12/2014  . PUD (peptic ulcer disease) 10/05/2013  . Acute gastric ulcer due to Helicobacter pylori 33/29/5188  . Right kidney stone 12/31/2012  . Hypotension 12/31/2012  . Pancreatic atrophy 12/31/2012    Past Surgical History:  Procedure Laterality Date  . ABDOMINAL HYSTERECTOMY    . CHOLECYSTECTOMY    . ESOPHAGOGASTRODUODENOSCOPY N/A 03/03/2013   RMR: Significant gastric ulcer without bleeding stigmata requiring no endoscopic intervention today. Noncritical Schatzki's ring. Small hiatal hernia  . ESOPHAGOGASTRODUODENOSCOPY N/A 08/24/2013   Dr. [name]: Hiatal hernia.  Gastric mucosal scar formation at site of previously noted gastric ulcerations  . JOINT REPLACEMENT    . LEFT KNEE SURGERY AND REVISION    . RIGHT HIP SURGERY      Current Outpatient Rx  . Order #: [id number] Class: Historical Med  . Order #: [id number] Class: Historical Med  . Order #: [id number] Class: Historical Med  . Order #: [id number] Class: Print  . Order #: [id number] Class: Historical Med  . Order #: [id number] Class: Historical Med  . Order #: [id number] Class: Historical Med  . Order #: [id number] Class: Historical Med  . Order #: [id number] Class: Historical Med    Allergies Sulfa antibiotics and Aspirin  Family History  Problem Relation Age of Onset  . Cancer Mother     Social History Social History  Substance Use Topics  . Smoking status: Former Smoker    Types: Cigarettes    Quit date: 04/25/1951  . Smokeless  tobacco: Never Used  . Alcohol use No     Comment: 1 glass of wine a day- former    Review of Systems  All other systems negative except as documented in the HPI. All pertinent positives and negatives as reviewed in the HPI. ____________________________________________   PHYSICAL EXAM:  VITAL SIGNS: ED Triage Vitals  Enc Vitals Group     BP 02/26/17 1358 (!)  131/57     Pulse Rate 02/26/17 1358 62     Resp 02/26/17 1358 14     Temp 02/26/17 1358 98.1 F (36.7 C)     Temp Source 02/26/17 1358 Oral     SpO2 02/26/17 1358 99 %     Weight 02/26/17 1358 74 lb (33.6 kg)     Height 02/26/17 1358 4\' 10"  (1.473 m)     Head Circumference --      Peak Flow --      Pain Score 02/26/17 1355 6     Pain Loc --      Pain Edu? --      Excl. in [place]? --     Constitutional: Alert and oriented. Well appearing and in no acute distress. Eyes: Conjunctivae are normal. PERRL. EOMI. Head: Atraumatic. Nose: No congestion/rhinnorhea. Mouth/Throat: Mucous membranes are moist.  Oropharynx non-erythematous. Neck: No stridor.  No meningeal signs.   Cardiovascular: Normal rate, regular rhythm. Good peripheral circulation. Grossly normal heart sounds.   Respiratory: tachypneic and not able to finish sentences without taking a breath. No retractions. Lungs CTAB. Gastrointestinal: Soft and nontender. No distention.  Musculoskeletal: No lower extremity tenderness nor edema. No gross deformities of extremities. Neurologic:  Normal speech and language. No gross focal neurologic deficits are appreciated.  Skin:  Skin is warm, dry and intact. No rash noted.   ____________________________________________   LABS (all labs ordered are listed, but only abnormal results are displayed)  Labs Reviewed  COMPREHENSIVE METABOLIC PANEL - Abnormal; Notable for the following:       Result Value   Glucose, Bld 103 (*)    Total Protein 6.1 (*)    ALT 7 (*)    All other components within normal limits  CBC WITH DIFFERENTIAL/PLATELET - Abnormal; Notable for the following:    RBC 3.45 (*)    Hemoglobin 11.1 (*)    HCT 33.6 (*)    All other components within normal limits  URINALYSIS, COMPLETE (UACMP) WITH MICROSCOPIC - Abnormal; Notable for the following:    Hgb urine dipstick MODERATE (*)    Squamous Epithelial / LPF 0-5 (*)    All other components within normal limits    TROPONIN I  CBG MONITORING, ED   ____________________________________________  EKG   EKG Interpretation  Date/Time:  Thursday February 26 2017 13:58:30 EDT Ventricular Rate:  60 PR Interval:    QRS Duration: 94 QT Interval:  409 QTC Calculation: 409 R Axis:     Text Interpretation:  Sinus rhythm Abnormal R-wave progression, early transition No significant change since last tracing Confirmed by Merrily Pew [phone]) on 02/26/2017 2:03:33 PM       ____________________________________________  RADIOLOGY  Dg Chest 2 View  Result Date: 02/26/2017 CLINICAL DATA:  Dizziness and weakness EXAM: CHEST  2 VIEW COMPARISON:  Chest radiograph 08/16/2016 FINDINGS: Severe thoracic dextroscoliosis with advanced bilateral humeral head degeneration. No focal airspace consolidation. Unchanged cardiomediastinal contours. No sizable pleural effusion or pneumothorax. IMPRESSION: Unchanged thoracic dextroscoliosis without acute airspace disease. Electronically Signed   By: Cletus Gash.D.  On: 02/26/2017 15:29   Ct Head Wo Contrast  Result Date: 02/26/2017 CLINICAL DATA:  Dizziness and weakness. EXAM: CT HEAD WITHOUT CONTRAST TECHNIQUE: Contiguous axial images were obtained from the base of the skull through the vertex without intravenous contrast. COMPARISON:  None. FINDINGS: Brain: No evidence of acute infarction, hemorrhage, hydrocephalus, extra-axial collection or mass lesion/mass effect. Age-related cerebral atrophy with compensatory dilatation of the ventricles. Mild crowding of the gyri at the vertex. Diffuse periventricular white matter and corona radiata hypodensities favor chronic ischemic microvascular white matter disease. Vascular: Atherosclerotic vascular calcification of the carotid siphons. No hyperdense vessel. Skull: Normal. Negative for fracture or focal lesion. Sinuses/Orbits: The bilateral paranasal sinuses and mastoid air cells are clear. Bilateral pseudophakia. No acute orbital  abnormality. Other: None. IMPRESSION: 1. No acute intracranial abnormality. Diffuse chronic microvascular ischemic white matter disease. 2. Enlargement of the ventricles is likely related to diffuse cerebral atrophy, however slight crowding of the cerebral gyri at the vertex could reflect a degree of normal pressure hydrocephalus. Electronically Signed   By: Titus Dubin M.D.   On: 02/26/2017 16:30    ____________________________________________   PROCEDURES  Procedure(s) performed:   Procedures   ____________________________________________   INITIAL IMPRESSION / ASSESSMENT AND PLAN / ED COURSE  Pertinent labs & imaging results that were available during my care of the patient were reviewed by me and considered in my medical decision making (see chart for details).  Unclear of the cause of her symptoms. We'll do a broad generalized workup. Her mouth is dry suggestive possible dehydration. We'll also evaluate for infection, intracranial causes. She is slightly tachypneic as well when I bring that up she states that she feels that she is just anxious. The chest x-ray and cardiac workup to make sure this isn't related to that.  Ambulates without difficulty but still has slight dizziness with standing. Suspect hypovolemia. W/u otherwise unremarkable. Will give another 500 cc, request case management make contact to help her with HHA/PT/OT as felt necessary. otherwise stable for dc at this time.    ____________________________________________  FINAL CLINICAL IMPRESSION(S) / ED DIAGNOSES  Final diagnoses:  Dehydration  Dizziness     MEDICATIONS GIVEN DURING THIS VISIT:  Medications  sodium chloride 0.9 % bolus 1,000 mL (0 mLs Intravenous Stopped 02/26/17 1550)  sodium chloride 0.9 % bolus 500 mL (0 mLs Intravenous Stopped 02/26/17 1939)     NEW OUTPATIENT MEDICATIONS STARTED DURING THIS VISIT:  Discharge Medication List as of 02/26/2017  6:41 PM      Note:  This  document was prepared using Dragon voice recognition software and may include unintentional dictation errors.   Merrily Pew, MD 02/26/17 [id number]

## 2017-02-26 NOTE — ED Triage Notes (Signed)
Pt brought in by [facility] with c/o dizziness and weakness that started this morning. Pt states, "I feel like I am going to fall backwards". Denies any acute pain, only chronic arthritis pain. EMS reports got up from the toilet at home and walked to the EMS stretcher with no problems.

## 2017-02-26 NOTE — ED Notes (Signed)
Left message for [facility] @ Case Management @ [place]

## 2017-02-27 NOTE — Care Management (Signed)
Received voicemail from ER regarding [facility] for patient. Called patient, she reports she doesn't need [place] services and that she already has someone helping her with bathing. She reports her only trouble is her arthritis.

## 2017-11-08 ENCOUNTER — Emergency Department (HOSPITAL_COMMUNITY)
Admission: EM | Admit: 2017-11-08 | Discharge: 2017-11-08 | Disposition: A | Discharge status: Home / Self Care | Payer: Medicare Other | Attending: Emergency Medicine | Admitting: Emergency Medicine

## 2017-11-08 ENCOUNTER — Other Ambulatory Visit: Payer: Self-pay

## 2017-11-08 ENCOUNTER — Encounter (HOSPITAL_COMMUNITY): Payer: Self-pay | Admitting: Emergency Medicine

## 2017-11-08 DIAGNOSIS — R609 Edema, unspecified: Secondary | ICD-10-CM | POA: Diagnosis not present

## 2017-11-08 DIAGNOSIS — Z8542 Personal history of malignant neoplasm of other parts of uterus: Secondary | ICD-10-CM | POA: Insufficient documentation

## 2017-11-08 DIAGNOSIS — Z79899 Other long term (current) drug therapy: Secondary | ICD-10-CM | POA: Diagnosis not present

## 2017-11-08 DIAGNOSIS — R2242 Localized swelling, mass and lump, left lower limb: Secondary | ICD-10-CM | POA: Diagnosis present

## 2017-11-08 DIAGNOSIS — Z96652 Presence of left artificial knee joint: Secondary | ICD-10-CM | POA: Diagnosis not present

## 2017-11-08 DIAGNOSIS — I5032 Chronic diastolic (congestive) heart failure: Secondary | ICD-10-CM | POA: Diagnosis not present

## 2017-11-08 DIAGNOSIS — R600 Localized edema: Secondary | ICD-10-CM | POA: Insufficient documentation

## 2017-11-08 DIAGNOSIS — Z87891 Personal history of nicotine dependence: Secondary | ICD-10-CM | POA: Insufficient documentation

## 2017-11-08 LAB — COMPREHENSIVE METABOLIC PANEL
ALBUMIN: 4.5 g/dL (ref 3.5–5.0)
ALK PHOS: 46 U/L (ref 38–126)
ALT: 11 U/L (ref 0–44)
AST: 21 U/L (ref 15–41)
Anion gap: 9 (ref 5–15)
BUN: 14 mg/dL (ref 8–23)
CALCIUM: 9.3 mg/dL (ref 8.9–10.3)
CO2: 26 mmol/L (ref 22–32)
CREATININE: 0.68 mg/dL (ref 0.44–1.00)
Chloride: 102 mmol/L (ref 98–111)
GFR calc non Af Amer: >60 mL/min (ref >60)
GLUCOSE: 88 mg/dL (ref 70–99)
Potassium: 4 mmol/L (ref 3.5–5.1)
SODIUM: 137 mmol/L (ref 135–145)
Total Bilirubin: 1.1 mg/dL (ref 0.3–1.2)
Total Protein: 6.9 g/dL (ref 6.5–8.1)

## 2017-11-08 LAB — CBC WITH DIFFERENTIAL/PLATELET
BASOS ABS: 0 10*3/uL (ref 0.0–0.1)
BASOS PCT: 1 %
EOS ABS: 0.1 10*3/uL (ref 0.0–0.7)
EOS PCT: 1 %
HCT: 35.5 % — ABNORMAL LOW (ref 36.0–46.0)
HEMOGLOBIN: 11.8 g/dL — AB (ref 12.0–15.0)
LYMPHS ABS: 1.8 10*3/uL (ref 0.7–4.0)
Lymphocytes Relative: 29 %
MCH: 33.1 pg (ref 26.0–34.0)
MCHC: 33.2 g/dL (ref 30.0–36.0)
MCV: 99.4 fL (ref 78.0–100.0)
Monocytes Absolute: 0.6 10*3/uL (ref 0.1–1.0)
Monocytes Relative: 10 %
NEUTROS PCT: 59 %
Neutro Abs: 3.8 10*3/uL (ref 1.7–7.7)
PLATELETS: 217 10*3/uL (ref 150–400)
RBC: 3.57 MIL/uL — AB (ref 3.87–5.11)
RDW: 13.6 % (ref 11.5–15.5)
WBC: 6.4 10*3/uL (ref 4.0–10.5)

## 2017-11-08 LAB — BRAIN NATRIURETIC PEPTIDE: B Natriuretic Peptide: 148 pg/mL — ABNORMAL HIGH (ref 0.0–100.0)

## 2017-11-08 MED ORDER — POTASSIUM CHLORIDE CRYS ER 20 MEQ PO TBCR
20.0000 meq | EXTENDED_RELEASE_TABLET | Freq: Once | ORAL | Status: AC
  Administered 2017-11-08: 20 meq via ORAL
  Filled 2017-11-08: qty 1

## 2017-11-08 MED ORDER — FUROSEMIDE 40 MG PO TABS
20.0000 mg | ORAL_TABLET | Freq: Once | ORAL | Status: AC
  Administered 2017-11-08: 20 mg via ORAL
  Filled 2017-11-08: qty 1

## 2017-11-08 MED ORDER — POTASSIUM CHLORIDE CRYS ER 20 MEQ PO TBCR: 20.0000 meq | EXTENDED_RELEASE_TABLET | Freq: Two times a day (BID) | ORAL | 0 refills | Status: DC

## 2017-11-08 MED ORDER — FUROSEMIDE 20 MG PO TABS: 20.0000 mg | ORAL_TABLET | Freq: Every day | ORAL | 0 refills | Status: DC

## 2017-11-08 NOTE — ED Notes (Signed)
Pt lives alone with 2 caregivers One day/1 night Per caregiver, pt here due to swelling of legs which is chronic Since last week- spoke w Dr [place] Crane who advised elevating legs   Also has had no BM since last Monday  Pt takes miralax daily as well as juice and stool softener at night Caregiver has not tried enema, nor metamucil, not mag citrate

## 2017-11-08 NOTE — ED Triage Notes (Signed)
Patient has bilateral leg swelling with pitting edema, Right more swollen then left. Per family member swelling started x1 week ago and progressively getting worse. Patient's leg red, shinny, and warm to touch per family. Denies of any shortness of breath, pain, or fevers. Denies hx of CHF. Patient does states fatigue. Patient also c/o constipation. Per family patient has not had BM since Monday, in which she had three. Patient takes Mirilax and stool softeners because she takes opioids.

## 2017-11-09 NOTE — ED Provider Notes (Signed)
[facility] EMERGENCY DEPARTMENT Provider Note   CSN: [id number] Arrival date & time: 11/08/17  1452     History   Chief Complaint Chief Complaint  Patient presents with  . Leg Swelling    HPI [name] is a 84 y.o. female.  HPI   84 year old female presenting with caregiver for evaluation of leg swelling.  Chronic but a little bit worse the past week or so.  PCP advised elevation.  No respiratory complaints.  Denies any acute pain.  She also reports constipation which is a chronic issue.  Past Medical History:  Diagnosis Date  . Anemia   . Arthritis   . Cancer ([place])    uterine surg only  . CHF (congestive heart failure) ([place])   . Colitis 12/31/2012   presumed C.Diff. patient declined colonoscopy  . Hypotension   . Kidney stone   . Muscle weakness (generalized)   . Osteoarthritis   . Pancreatic atrophy 12/31/2012  . Pneumonia     Patient Active Problem List   Diagnosis Date Noted  . Multiple rib fractures 05/09/2016  . Fall 05/09/2016  . Leukocytosis 05/09/2016  . Chest pain 03/13/2015  . Other specified fever   . Pain in the chest   . Abdominal pain 10/28/2014  . Anemia 10/28/2014  . Hypokalemia 10/28/2014  . Protein-calorie malnutrition, severe ([place]) 10/28/2014  . Adynamic ileus ([place]) 10/28/2014  . Dyspnea 09/18/2014  . Nausea and vomiting 09/18/2014  . Cellulitis 09/02/2014  . Community acquired pneumonia 08/14/2014  . Acute respiratory failure with hypoxia ([place]) 08/13/2014  . Macrocytic anemia 08/13/2014  . Chronic diastolic CHF (congestive heart failure) ([place]) 08/13/2014  . CAP (community acquired pneumonia) 08/12/2014  . PUD (peptic ulcer disease) 10/05/2013  . Acute gastric ulcer due to Helicobacter pylori 54/65/0354  . Right kidney stone 12/31/2012  . Hypotension 12/31/2012  . Pancreatic atrophy 12/31/2012    Past Surgical History:  Procedure Laterality Date  . ABDOMINAL HYSTERECTOMY    . CHOLECYSTECTOMY    .  ESOPHAGOGASTRODUODENOSCOPY N/A 03/03/2013   RMR: Significant gastric ulcer without bleeding stigmata requiring no endoscopic intervention today. Noncritical Schatzki's ring. Small hiatal hernia  . ESOPHAGOGASTRODUODENOSCOPY N/A 08/24/2013   Dr. [name]: Hiatal hernia.  Gastric mucosal scar formation at site of previously noted gastric ulcerations  . JOINT REPLACEMENT    . LEFT KNEE SURGERY AND REVISION    . RIGHT HIP SURGERY       OB History    Gravida  3   Para  2   Term  2   Preterm      AB  1   Living        SAB  1   TAB      Ectopic      Multiple      Live Births               Home Medications    Prior to Admission medications   Medication Sig Start Date End Date Taking? Authorizing Provider  acetaminophen (TYLENOL) 500 MG tablet Take 500-1,000 mg by mouth every 6 (six) hours as needed for mild pain, moderate pain, fever or headache.     [provider]  albuterol (PROAIR HFA) 108 (90 BASE) MCG/ACT inhaler Inhale 1-2 puffs into the lungs every 6 (six) hours as needed for wheezing or shortness of breath.    [provider]  ALPRAZolam Duanne Moron) 0.25 MG tablet Take 0.25 mg by mouth daily as needed for anxiety.  06/10/16  [provider]  alum & mag hydroxide-simeth (MAALOX REGULAR STRENGTH) 200-200-20 MG/5ML suspension Take 15 mLs by mouth every 6 (six) hours as needed for indigestion or heartburn. 08/16/16   Kinnie Feil, PA-C  docusate sodium (COLACE) 100 MG capsule Take 100 mg by mouth 2 (two) times daily as needed for mild constipation.     [provider]  furosemide (LASIX) 20 MG tablet Take 1 tablet (20 mg total) by mouth daily. 11/08/17   Virgel Manifold, MD  gabapentin (NEURONTIN) 100 MG capsule Take 1 capsule by mouth at bedtime. 06/10/16   [provider]  oxyCODONE-acetaminophen (PERCOCET) 7.5-325 MG tablet Take 1 tablet by mouth every 4 (four) hours as needed for severe pain.    [provider]    pantoprazole (PROTONIX) 40 MG tablet Take 40 mg by mouth daily.     [provider]  polyethylene glycol (MIRALAX / GLYCOLAX) packet Take 17 g by mouth daily as needed for mild constipation.    [provider]  potassium chloride SA (K-DUR,KLOR-CON) 20 MEQ tablet Take 1 tablet (20 mEq total) by mouth 2 (two) times daily. 11/08/17   Virgel Manifold, MD    Family History Family History  Problem Relation Age of Onset  . Cancer Mother     Social History Social History   Tobacco Use  . Smoking status: Former Smoker    Types: Cigarettes    Last attempt to quit: 04/25/1951    Years since quitting: 66.5  . Smokeless tobacco: Never Used  Substance Use Topics  . Alcohol use: No    Alcohol/week: 0.6 oz    Types: 1 Glasses of wine per week  . Drug use: No     Allergies   Sulfa antibiotics and Aspirin   Review of Systems Review of Systems  All systems reviewed and negative, other than as noted in HPI.  Physical Exam Updated Vital Signs BP (!) 149/77 (BP Location: Left Arm)   Pulse (!) 55   Temp 99.2 F (37.3 C) (Oral)   Resp 18   Ht 4\' 6"  (1.372 m)   Wt 34 kg (75 lb)   SpO2 98%   BMI 18.08 kg/m   Physical Exam  Constitutional: She appears well-developed and well-nourished. No distress.  HENT:  Head: Normocephalic and atraumatic.  Eyes: Conjunctivae are normal. Right eye exhibits no discharge. Left eye exhibits no discharge.  Neck: Neck supple.  Cardiovascular: Normal rate, regular rhythm and normal heart sounds. Exam reveals no gallop and no friction rub.  No murmur heard. Pulmonary/Chest: Effort normal and breath sounds normal. No respiratory distress.  Abdominal: Soft. She exhibits no distension. There is no tenderness.  Musculoskeletal: She exhibits edema. She exhibits no tenderness.  Moderate symmetric pitting lower extremity edema.  No calf tenderness.  No concerning skin changes otherwise.  Neurological: She is alert.  Skin: Skin is warm and  dry.  Psychiatric: She has a normal mood and affect. Her behavior is normal. Thought content normal.  Nursing note and vitals reviewed.    ED Treatments / Results  Labs (all labs ordered are listed, but only abnormal results are displayed) Labs Reviewed  CBC WITH DIFFERENTIAL/PLATELET - Abnormal; Notable for the following components:      Result Value   RBC 3.57 (*)    Hemoglobin 11.8 (*)    HCT 35.5 (*)    All other components within normal limits  BRAIN NATRIURETIC PEPTIDE - Abnormal; Notable for the following components:   B Natriuretic  Peptide 148.0 (*)    All other components within normal limits  COMPREHENSIVE METABOLIC PANEL    EKG None  Radiology No results found.  Procedures Procedures (including critical care time)  Medications Ordered in ED Medications  furosemide (LASIX) tablet 20 mg (20 mg Oral Given 11/08/17 1759)  potassium chloride SA (K-DUR,KLOR-CON) CR tablet 20 mEq (20 mEq Oral Given 11/08/17 1759)     Initial Impression / Assessment and Plan / ED Course  I have reviewed the triage vital signs and the nursing notes.  Pertinent labs & imaging results that were available during my care of the patient were reviewed by me and considered in my medical decision making (see chart for details).     Lower extremity edema.  Doubt DVT.  She has no respiratory complaints.  Advised to continue to elevate.  We will put on a low dose of Lasix for a few days.  Return precautions discussed.  May also benefit from compression hose. Final Clinical Impressions(s) / ED Diagnoses   Final diagnoses:  Peripheral edema    ED Discharge Orders        Ordered    furosemide (LASIX) 20 MG tablet  Daily     11/08/17 1734    potassium chloride SA (K-DUR,KLOR-CON) 20 MEQ tablet  2 times daily     11/08/17 1734       Virgel Manifold, MD 11/09/17 2355

## 2017-11-21 ENCOUNTER — Emergency Department (HOSPITAL_COMMUNITY): Payer: Medicare Other

## 2017-11-21 ENCOUNTER — Encounter (HOSPITAL_COMMUNITY): Payer: Self-pay | Admitting: Emergency Medicine

## 2017-11-21 ENCOUNTER — Emergency Department (HOSPITAL_COMMUNITY)
Admission: EM | Admit: 2017-11-21 | Discharge: 2017-11-21 | Disposition: A | Discharge status: Home / Self Care | Payer: Medicare Other | Attending: Emergency Medicine | Admitting: Emergency Medicine

## 2017-11-21 DIAGNOSIS — Z8542 Personal history of malignant neoplasm of other parts of uterus: Secondary | ICD-10-CM | POA: Diagnosis not present

## 2017-11-21 DIAGNOSIS — Z87891 Personal history of nicotine dependence: Secondary | ICD-10-CM | POA: Insufficient documentation

## 2017-11-21 DIAGNOSIS — I5032 Chronic diastolic (congestive) heart failure: Secondary | ICD-10-CM | POA: Insufficient documentation

## 2017-11-21 DIAGNOSIS — W0110XA Fall on same level from slipping, tripping and stumbling with subsequent striking against unspecified object, initial encounter: Secondary | ICD-10-CM | POA: Insufficient documentation

## 2017-11-21 DIAGNOSIS — Z79899 Other long term (current) drug therapy: Secondary | ICD-10-CM | POA: Diagnosis not present

## 2017-11-21 DIAGNOSIS — Z9049 Acquired absence of other specified parts of digestive tract: Secondary | ICD-10-CM | POA: Diagnosis not present

## 2017-11-21 DIAGNOSIS — Y998 Other external cause status: Secondary | ICD-10-CM | POA: Diagnosis not present

## 2017-11-21 DIAGNOSIS — F0390 Unspecified dementia without behavioral disturbance: Secondary | ICD-10-CM | POA: Insufficient documentation

## 2017-11-21 DIAGNOSIS — S42324A Nondisplaced transverse fracture of shaft of humerus, right arm, initial encounter for closed fracture: Secondary | ICD-10-CM

## 2017-11-21 DIAGNOSIS — W19XXXA Unspecified fall, initial encounter: Secondary | ICD-10-CM

## 2017-11-21 DIAGNOSIS — Y9389 Activity, other specified: Secondary | ICD-10-CM | POA: Insufficient documentation

## 2017-11-21 DIAGNOSIS — Y92009 Unspecified place in unspecified non-institutional (private) residence as the place of occurrence of the external cause: Secondary | ICD-10-CM | POA: Insufficient documentation

## 2017-11-21 DIAGNOSIS — S4991XA Unspecified injury of right shoulder and upper arm, initial encounter: Secondary | ICD-10-CM | POA: Diagnosis present

## 2017-11-21 LAB — URINALYSIS, ROUTINE W REFLEX MICROSCOPIC
BACTERIA UA: NONE SEEN
Bilirubin Urine: NEGATIVE
Glucose, UA: NEGATIVE mg/dL
Ketones, ur: NEGATIVE mg/dL
Leukocytes, UA: NEGATIVE
Nitrite: NEGATIVE
PH: 7 (ref 5.0–8.0)
Protein, ur: NEGATIVE mg/dL
SPECIFIC GRAVITY, URINE: 1.013 (ref 1.005–1.030)

## 2017-11-21 LAB — CBC WITH DIFFERENTIAL/PLATELET
Basophils Absolute: 0 10*3/uL (ref 0.0–0.1)
Basophils Relative: 0 %
Eosinophils Absolute: 0 10*3/uL (ref 0.0–0.7)
Eosinophils Relative: 0 %
HEMATOCRIT: 33.9 % — AB (ref 36.0–46.0)
HEMOGLOBIN: 11.3 g/dL — AB (ref 12.0–15.0)
LYMPHS ABS: 1.2 10*3/uL (ref 0.7–4.0)
LYMPHS PCT: 11 %
MCH: 32.6 pg (ref 26.0–34.0)
MCHC: 33.3 g/dL (ref 30.0–36.0)
MCV: 97.7 fL (ref 78.0–100.0)
Monocytes Absolute: 1.1 10*3/uL — ABNORMAL HIGH (ref 0.1–1.0)
Monocytes Relative: 10 %
NEUTROS PCT: 79 %
Neutro Abs: 9.1 10*3/uL — ABNORMAL HIGH (ref 1.7–7.7)
Platelets: 206 10*3/uL (ref 150–400)
RBC: 3.47 MIL/uL — AB (ref 3.87–5.11)
RDW: 13.4 % (ref 11.5–15.5)
WBC: 11.5 10*3/uL — AB (ref 4.0–10.5)

## 2017-11-21 LAB — BASIC METABOLIC PANEL
Anion gap: 6 (ref 5–15)
BUN: 14 mg/dL (ref 8–23)
CO2: 25 mmol/L (ref 22–32)
Calcium: 8.8 mg/dL — ABNORMAL LOW (ref 8.9–10.3)
Chloride: 106 mmol/L (ref 98–111)
Creatinine, Ser: 0.57 mg/dL (ref 0.44–1.00)
GFR calc Af Amer: >60 mL/min (ref >60)
GFR calc non Af Amer: >60 mL/min (ref >60)
GLUCOSE: 96 mg/dL (ref 70–99)
POTASSIUM: 4.1 mmol/L (ref 3.5–5.1)
Sodium: 137 mmol/L (ref 135–145)

## 2017-11-21 LAB — TROPONIN I

## 2017-11-21 MED ORDER — MORPHINE SULFATE (PF) 2 MG/ML IV SOLN
2.0000 mg | Freq: Once | INTRAVENOUS | Status: AC
  Administered 2017-11-21: 2 mg via INTRAVENOUS
  Filled 2017-11-21: qty 1

## 2017-11-21 MED ORDER — SODIUM CHLORIDE 0.9 % IV BOLUS
500.0000 mL | Freq: Once | INTRAVENOUS | Status: AC
  Administered 2017-11-21: 500 mL via INTRAVENOUS

## 2017-11-21 NOTE — Discharge Instructions (Addendum)
You have fractured your humerus bone.  Sling, take your normal pain medicine, follow-up with orthopedics.  Phone number given.

## 2017-11-21 NOTE — ED Notes (Signed)
Per EMS, gave pt 50 of Fentanyl and O2 sats dropped to 88% RA.

## 2017-11-21 NOTE — ED Notes (Addendum)
Have notified caregiver that EMS has been called for transport, but would be closer to shift change. Per charge nurse, wait until EMS arrival to put on arm sling.

## 2017-11-21 NOTE — ED Notes (Signed)
Pt waiting for EMS.

## 2017-11-21 NOTE — ED Triage Notes (Signed)
Pt was found on the floor this morning by a caretaker.  Pt lives alone.  Unsure of how long she has been on the floor.  Deformity to right shoulder.  Given Fentanyl 11mcg by ems.

## 2017-11-21 NOTE — ED Notes (Signed)
Ems here to pick up pt.

## 2017-11-21 NOTE — ED Notes (Signed)
Pt resting with equal rise and chest fall  

## 2017-11-22 NOTE — ED Provider Notes (Signed)
[facility] EMERGENCY DEPARTMENT Provider Note   CSN: [id number] Arrival date & time: 11/21/17  1130     History   Chief Complaint Chief Complaint  Patient presents with  . Fall    HPI [name] is a 84 y.o. female.  Level 5 caveat for moderate dementia and acuity of condition.  Patient lives at home with help.  She was found on the floor this morning by her caregiver.  She complains of right humerus pain and low back pain.  No obvious neurological deficits.  No head or neck trauma.  Caregiver reports normal behavior.  No prodromal illnesses.  No chest pain, dyspnea, dysuria, fever, sweats, chills.     Past Medical History:  Diagnosis Date  . Anemia   . Arthritis   . Cancer ([place])    uterine surg only  . CHF (congestive heart failure) ([place])   . Colitis 12/31/2012   presumed C.Diff. patient declined colonoscopy  . Hypotension   . Kidney stone   . Muscle weakness (generalized)   . Osteoarthritis   . Pancreatic atrophy 12/31/2012  . Pneumonia     Patient Active Problem List   Diagnosis Date Noted  . Multiple rib fractures 05/09/2016  . Fall 05/09/2016  . Leukocytosis 05/09/2016  . Chest pain 03/13/2015  . Other specified fever   . Pain in the chest   . Abdominal pain 10/28/2014  . Anemia 10/28/2014  . Hypokalemia 10/28/2014  . Protein-calorie malnutrition, severe ([place]) 10/28/2014  . Adynamic ileus ([place]) 10/28/2014  . Dyspnea 09/18/2014  . Nausea and vomiting 09/18/2014  . Cellulitis 09/02/2014  . Community acquired pneumonia 08/14/2014  . Acute respiratory failure with hypoxia ([place]) 08/13/2014  . Macrocytic anemia 08/13/2014  . Chronic diastolic CHF (congestive heart failure) ([place]) 08/13/2014  . CAP (community acquired pneumonia) 08/12/2014  . PUD (peptic ulcer disease) 10/05/2013  . Acute gastric ulcer due to Helicobacter pylori 97/67/3419  . Right kidney stone 12/31/2012  . Hypotension 12/31/2012  . Pancreatic atrophy 12/31/2012    Past  Surgical History:  Procedure Laterality Date  . ABDOMINAL HYSTERECTOMY    . CHOLECYSTECTOMY    . ESOPHAGOGASTRODUODENOSCOPY N/A 03/03/2013   RMR: Significant gastric ulcer without bleeding stigmata requiring no endoscopic intervention today. Noncritical Schatzki's ring. Small hiatal hernia  . ESOPHAGOGASTRODUODENOSCOPY N/A 08/24/2013   Dr. [name]: Hiatal hernia.  Gastric mucosal scar formation at site of previously noted gastric ulcerations  . JOINT REPLACEMENT    . LEFT KNEE SURGERY AND REVISION    . RIGHT HIP SURGERY       OB History    Gravida  3   Para  2   Term  2   Preterm      AB  1   Living        SAB  1   TAB      Ectopic      Multiple      Live Births               Home Medications    Prior to Admission medications   Medication Sig Start Date End Date Taking? Authorizing Provider  acetaminophen (TYLENOL) 500 MG tablet Take 500-1,000 mg by mouth every 6 (six) hours as needed for mild pain, moderate pain, fever or headache.    Yes [provider]  albuterol (PROAIR HFA) 108 (90 BASE) MCG/ACT inhaler Inhale 1-2 puffs into the lungs every 6 (six) hours as needed for wheezing or shortness of breath.  Yes [provider]  ALPRAZolam (XANAX) 0.25 MG tablet Take 0.25 mg by mouth daily as needed for anxiety.  06/10/16  Yes [provider]  alum & mag hydroxide-simeth (MAALOX REGULAR STRENGTH) 387-564-33 MG/5ML suspension Take 15 mLs by mouth every 6 (six) hours as needed for indigestion or heartburn. 08/16/16  Yes Kinnie Feil, PA-C  diclofenac sodium (VOLTAREN) 1 % GEL Apply 2 g topically 4 (four) times daily.  08/18/17  Yes [provider]  docusate sodium (COLACE) 100 MG capsule Take 100 mg by mouth daily.    Yes [provider]  furosemide (LASIX) 20 MG tablet Take 1 tablet (20 mg total) by mouth daily. 11/08/17  Yes Virgel Manifold, MD  gabapentin (NEURONTIN) 100 MG capsule Take 1 capsule by mouth at bedtime as  needed (pain).  06/10/16  Yes [provider]  oxyCODONE-acetaminophen (PERCOCET) 7.5-325 MG tablet Take 1 tablet by mouth every 4 (four) hours as needed for severe pain.   Yes [provider]  pantoprazole (PROTONIX) 40 MG tablet Take 40 mg by mouth daily.    Yes [provider]  polyethylene glycol (MIRALAX / GLYCOLAX) packet Take 17 g by mouth daily.    Yes [provider]  potassium chloride SA (K-DUR,KLOR-CON) 20 MEQ tablet Take 1 tablet (20 mEq total) by mouth 2 (two) times daily. 11/08/17  Yes Virgel Manifold, MD    Family History Family History  Problem Relation Age of Onset  . Cancer Mother     Social History Social History   Tobacco Use  . Smoking status: Former Smoker    Types: Cigarettes    Last attempt to quit: 04/25/1951    Years since quitting: 66.6  . Smokeless tobacco: Never Used  Substance Use Topics  . Alcohol use: No    Alcohol/week: 0.6 oz    Types: 1 Glasses of wine per week  . Drug use: No     Allergies   Sulfa antibiotics and Aspirin   Review of Systems Review of Systems  Unable to perform ROS: Dementia     Physical Exam Updated Vital Signs BP 114/76 (BP Location: Right Arm)   Pulse (!) 48   Temp 97.8 F (36.6 C) (Oral)   Resp 10   Ht 4\' 6"  (1.372 m)   Wt 34 kg (75 lb)   SpO2 98%   BMI 18.08 kg/m   Physical Exam  Constitutional:  Febrile, alert, HOH  HENT:  Head: Normocephalic and atraumatic.  Eyes: Conjunctivae are normal.  Neck: Neck supple.  Cardiovascular: Normal rate and regular rhythm.  Pulmonary/Chest: Effort normal and breath sounds normal.  Abdominal: Soft. Bowel sounds are normal.  Musculoskeletal:  Tender distal shaft of right humerus and lower lumbar area; scoliosis  Neurological: She is alert.  Skin: Skin is warm and dry.  Psychiatric:  Flat affect  Nursing note and vitals reviewed.    ED Treatments / Results  Labs (all labs ordered are listed, but only abnormal results are  displayed) Labs Reviewed  URINALYSIS, ROUTINE W REFLEX MICROSCOPIC - Abnormal; Notable for the following components:      Result Value   Hgb urine dipstick SMALL (*)    All other components within normal limits  CBC WITH DIFFERENTIAL/PLATELET - Abnormal; Notable for the following components:   WBC 11.5 (*)    RBC 3.47 (*)    Hemoglobin 11.3 (*)    HCT 33.9 (*)    Neutro Abs 9.1 (*)    Monocytes Absolute 1.1 (*)  All other components within normal limits  BASIC METABOLIC PANEL - Abnormal; Notable for the following components:   Calcium 8.8 (*)    All other components within normal limits  TROPONIN I    EKG None  Radiology Dg Lumbar Spine 2-3 Views  Result Date: 11/21/2017 CLINICAL DATA:  Fall from bed. EXAM: LUMBAR SPINE - 2-3 VIEW COMPARISON:  09/13/2015. FINDINGS: Marked scoliosis deformity is noted involving the thoracic and lumbar spine. The cross-table lateral radiograph is essentially nondiagnostic for the evaluation of the lumbar spine. The bones appear diffusely osteopenic. No acute bone abnormalities noted. 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 Right renal pelvis calcification is again noted measuring 1.1 cm. Aortic atherosclerosis is again noted. IMPRESSION: 1. Significantly diminished exam detail due to scoliosis deformity. 2. No acute abnormality identified. 3. Chronic changes as detailed above. Electronically Signed   By: Kerby Moors M.D.   On: 11/21/2017 13:54   Dg Humerus Right  Result Date: 11/21/2017 CLINICAL DATA:  84 year old female with history of trauma from a fall from bed yesterday evening with pain and swelling in the right upper arm. EXAM: RIGHT HUMERUS - 2+ VIEW COMPARISON:  No priors. FINDINGS: Study is very limited by atypical projections obtained. Despite this limitation, there is and obliquely oriented minimally displaced fracture of the distal third of the humeral diaphysis extending into the metaphyseal  region. This does not appear to extend into the elbow joint on the limited views provided. Extensive degenerative changes are noted at the glenohumeral joint related to longstanding osteoarthritis. IMPRESSION: 1. Poorly demonstrated oblique minimally displaced fracture of the distal metadiaphyseal region of the right humerus, as above. Electronically Signed   By: Vinnie Langton M.D.   On: 11/21/2017 13:51    Procedures Procedures (including critical care time)  Medications Ordered in ED Medications  sodium chloride 0.9 % bolus 500 mL (0 mLs Intravenous Stopped 11/21/17 1420)  morphine 2 MG/ML injection 2 mg (2 mg Intravenous Given 11/21/17 1227)     Initial Impression / Assessment and Plan / ED Course  I have reviewed the triage vital signs and the nursing notes.  Pertinent labs & imaging results that were available during my care of the patient were reviewed by me and considered in my medical decision making (see chart for details).     Patient presents with accidental fall striking her right humerus and lower back.  Caregiver reports normal behavior.  Screening labs, urinalysis, chest x-ray, EKG, troponin show no acute pathology.  Plain films of right humerus reveal a distal oblique normally displaced fracture.  Plain films of lumbar spine show no acute injury.  Although patient is febrile, I think she will do better at home.  Will discharge with sling and primary care follow-up.  Final Clinical Impressions(s) / ED Diagnoses   Final diagnoses:  Fall, initial encounter  Closed nondisplaced transverse fracture of shaft of right humerus, initial encounter    ED Discharge Orders    None       Nat Christen, MD 11/22/17 [phone]

## 2017-12-03 ENCOUNTER — Encounter: Payer: Self-pay | Admitting: Orthopaedic Surgery

## 2017-12-03 ENCOUNTER — Ambulatory Visit (INDEPENDENT_AMBULATORY_CARE_PROVIDER_SITE_OTHER): Payer: Medicare Other | Admitting: Orthopaedic Surgery

## 2017-12-03 ENCOUNTER — Ambulatory Visit (INDEPENDENT_AMBULATORY_CARE_PROVIDER_SITE_OTHER): Payer: Medicare Other

## 2017-12-03 VITALS — BP 118/62 | HR 59 | Temp 97.6°F

## 2017-12-03 DIAGNOSIS — S42494A Other nondisplaced fracture of lower end of right humerus, initial encounter for closed fracture: Secondary | ICD-10-CM

## 2017-12-03 NOTE — Progress Notes (Signed)
Subjective:    Patient ID: [name], female    DOB: Jul 18, 1918, 84 y.o.   MRN: [id number]  HPI She fell at home and hurt her right elbow and upper arm 11-20-17.  She got x-rays the next day showing: IMPRESSION: 1. Poorly demonstrated oblique minimally displaced fracture of the distal metadiaphyseal region of the right humerus, as above.  She was seen in the ER and the fracture was demonstrated.  She was given a sling.  Delay in seeing [place] here was explained as there was a hairline fracture.  Her daughter said since it was a hairline fracture nothing needed to be done.  She saw Dr. Wende Neighbors yesterday and he referred her here urgently and now is the best time to get her here in the office and transportation arranged.  She denies any other injury.  She denies any new falls.  Dr. [place] Crane is managing her pain. She does have chronic pain.   Review of Systems  Constitutional: Positive for activity change.  Musculoskeletal: Positive for arthralgias, back pain, gait problem and joint swelling.  All other systems reviewed and are negative.  For Review of Systems, all other systems reviewed and are negative.  Past Medical History:  Diagnosis Date  . Anemia   . Arthritis   . Cancer ([place])    uterine surg only  . CHF (congestive heart failure) ([place])   . Colitis 12/31/2012   presumed C.Diff. patient declined colonoscopy  . Hypotension   . Kidney stone   . Muscle weakness (generalized)   . Osteoarthritis   . Pancreatic atrophy 12/31/2012  . Pneumonia     Past Surgical History:  Procedure Laterality Date  . ABDOMINAL HYSTERECTOMY    . CHOLECYSTECTOMY    . ESOPHAGOGASTRODUODENOSCOPY N/A 03/03/2013   RMR: Significant gastric ulcer without bleeding stigmata requiring no endoscopic intervention today. Noncritical Schatzki's ring. Small hiatal hernia  . ESOPHAGOGASTRODUODENOSCOPY N/A 08/24/2013   Dr. [name]: Hiatal hernia.  Gastric mucosal scar formation at site of previously noted gastric  ulcerations  . JOINT REPLACEMENT    . LEFT KNEE SURGERY AND REVISION    . RIGHT HIP SURGERY      Current Outpatient Medications on File Prior to Visit  Medication Sig Dispense Refill  . acetaminophen (TYLENOL) 500 MG tablet Take 500-1,000 mg by mouth every 6 (six) hours as needed for mild pain, moderate pain, fever or headache.     . ALPRAZolam (XANAX) 0.25 MG tablet Take 0.25 mg by mouth daily as needed for anxiety.     Marland Kitchen alum & mag hydroxide-simeth (MAALOX REGULAR STRENGTH) 200-200-20 MG/5ML suspension Take 15 mLs by mouth every 6 (six) hours as needed for indigestion or heartburn. 355 mL 0  . diclofenac sodium (VOLTAREN) 1 % GEL Apply 2 g topically 4 (four) times daily.     Marland Kitchen docusate sodium (COLACE) 100 MG capsule Take 100 mg by mouth daily.     . pantoprazole (PROTONIX) 40 MG tablet Take 40 mg by mouth daily.     . polyethylene glycol (MIRALAX / GLYCOLAX) packet Take 17 g by mouth daily.     Marland Kitchen albuterol (PROAIR HFA) 108 (90 BASE) MCG/ACT inhaler Inhale 1-2 puffs into the lungs every 6 (six) hours as needed for wheezing or shortness of breath.    . furosemide (LASIX) 20 MG tablet Take 1 tablet (20 mg total) by mouth daily. (Patient not taking: Reported on 12/03/2017) 7 tablet 0  . gabapentin (NEURONTIN) 100 MG capsule Take 1 capsule  by mouth at bedtime as needed (pain).     Marland Kitchen oxyCODONE-acetaminophen (PERCOCET) 10-325 MG tablet     . potassium chloride SA (K-DUR,KLOR-CON) 20 MEQ tablet Take 1 tablet (20 mEq total) by mouth 2 (two) times daily. (Patient not taking: Reported on 12/03/2017) 7 tablet 0   No current facility-administered medications on file prior to visit.     Social History   Socioeconomic History  . Marital status: Widowed    Spouse name: Not on file  . Number of children: Not on file  . Years of education: Not on file  . Highest education level: Not on file  Occupational History  . Not on file  Social Needs  . Financial resource strain: Not on file  . Food  insecurity:    Worry: Not on file    Inability: Not on file  . Transportation needs:    Medical: Not on file    Non-medical: Not on file  Tobacco Use  . Smoking status: Former Smoker    Types: Cigarettes    Last attempt to quit: 04/25/1951    Years since quitting: 66.6  . Smokeless tobacco: Never Used  Substance and Sexual Activity  . Alcohol use: No    Alcohol/week: 0.6 oz    Types: 1 Glasses of wine per week  . Drug use: No  . Sexual activity: Never  Lifestyle  . Physical activity:    Days per week: Not on file    Minutes per session: Not on file  . Stress: Not on file  Relationships  . Social connections:    Talks on phone: Not on file    Gets together: Not on file    Attends religious service: Not on file    Active member of club or organization: Not on file    Attends meetings of clubs or organizations: Not on file    Relationship status: Not on file  . Intimate partner violence:    Fear of current or ex partner: Not on file    Emotionally abused: Not on file    Physically abused: Not on file    Forced sexual activity: Not on file  Other Topics Concern  . Not on file  Social History Narrative  . Not on file    Family History  Problem Relation Age of Onset  . Cancer Mother     BP 118/62   Pulse (!) 59   Temp 97.6 F (36.4 C)   There is no height or weight on file to calculate BMI.     Objective:   Physical Exam  Constitutional: She is oriented to person, place, and time. She appears well-developed and well-nourished.  HENT:  Head: Normocephalic and atraumatic.  Eyes: Pupils are equal, round, and reactive to light. Conjunctivae and EOM are normal.  Neck: Normal range of motion. Neck supple.  Cardiovascular: Normal rate, regular rhythm and intact distal pulses.  Pulmonary/Chest: Effort normal.  Abdominal: Soft.  Musculoskeletal:       Right elbow: Tenderness found.       Arms: Neurological: She is alert and oriented to person, place, and time. She  has normal reflexes. She displays normal reflexes. No cranial nerve deficit. She exhibits normal muscle tone. Coordination normal.  Skin: Skin is warm and dry.  Psychiatric: She has a normal mood and affect. Her behavior is normal. Judgment and thought content normal.     X-rays were done after a long arm splint applied, results separately recorded.  Assessment & Plan:   Encounter Diagnosis  Name Primary?  . Other closed nondisplaced fracture of distal end of right humerus, initial encounter Yes   She was placed in a posterior splint.  I got x-rays in the splint post application to make sure there is no change in alignment from the ER two weeks ago.  I felt it best to get the x-rays after the splint was applied than to do it before as she is in so much pain.  X-rays show slight separation of the fracture of the distal right humerus in the splint.  Return in one week.  X-rays in the splint.  Call if any problem.  Precautions discussed.   Electronically Signed Sanjuana Kava, MD 7/25/20194:11 PM

## 2017-12-09 ENCOUNTER — Encounter: Payer: Self-pay | Admitting: Orthopaedic Surgery

## 2017-12-09 ENCOUNTER — Ambulatory Visit (INDEPENDENT_AMBULATORY_CARE_PROVIDER_SITE_OTHER): Payer: Medicare Other | Admitting: Orthopaedic Surgery

## 2017-12-09 ENCOUNTER — Ambulatory Visit (INDEPENDENT_AMBULATORY_CARE_PROVIDER_SITE_OTHER): Payer: Medicare Other

## 2017-12-09 DIAGNOSIS — S42494D Other nondisplaced fracture of lower end of right humerus, subsequent encounter for fracture with routine healing: Secondary | ICD-10-CM

## 2017-12-09 NOTE — Progress Notes (Signed)
CC:  My arm still hurts  She has been using the posterior splint on the right.  She has no new trauma.  She has normal NV status.  She is hard of hearing.   X-rays were done of the humerus on the right, reported separately.  Encounter Diagnosis  Name Primary?  . Other closed nondisplaced fracture of distal end of right humerus with routine healing, subsequent encounter Yes   I am concerned that the fracture may have changed somewhat and is more apparent on the x-rays.  She is not a good candidate for surgery but I would like to have her seen at [facility] for their opinion.  The patient has been informed and is reluctantly agreeable to this.  Electronically Signed Sanjuana Kava, MD 7/31/20193:38 PM

## 2017-12-16 ENCOUNTER — Ambulatory Visit (INDEPENDENT_AMBULATORY_CARE_PROVIDER_SITE_OTHER): Payer: Self-pay

## 2017-12-16 ENCOUNTER — Encounter (INDEPENDENT_AMBULATORY_CARE_PROVIDER_SITE_OTHER): Payer: Self-pay | Admitting: Physician Assistant

## 2017-12-16 ENCOUNTER — Ambulatory Visit (INDEPENDENT_AMBULATORY_CARE_PROVIDER_SITE_OTHER): Payer: Medicare Other | Admitting: Physician Assistant

## 2017-12-16 DIAGNOSIS — M25521 Pain in right elbow: Secondary | ICD-10-CM | POA: Diagnosis not present

## 2017-12-16 NOTE — Progress Notes (Signed)
Office Visit Note   Patient: [name]           Date of Birth: 05-04-1919           MRN: [id number] Visit Date: 12/16/2017              Requested by: Celene Squibb, MD [place], [place] [address] PCP: Celene Squibb, MD   Assessment & Plan: Visit Diagnoses:  1. Pain in right elbow     Plan: Patient is placed back in a new long-arm splint.  We will see her back in 2 weeks repeat x-rays of the right elbow.  Most likely at that point time we will start range of motion.  She is to remain nonweightbearing right upper extremity.  Questions are encouraged and answered both the patient and her friend who brought her to the office today.  Follow-Up Instructions: Return in about 2 weeks (around 12/30/2017).   Orders:  Orders Placed This Encounter  Procedures  . XR Elbow 2 Views Right   No orders of the defined types were placed in this encounter.     Procedures: No procedures performed   Clinical Data: No additional findings.   Subjective: Chief Complaint  Patient presents with  . Right Elbow - Pain, Injury, Fracture    HPI [name] is a 84 year old female who was seen for an oblique distal right humerus metaphysis fracture that she sustained due to a fall at home on 11/20/2017.  She was seen in the ER and was placed in sling due to fracture being nondisplaced.  Subsequent films showed displacement of the fracture and extension into the radial condyle.  She is placed in the long-arm splint and sent here for further evaluation.  She is having no numbness tingling down the arm. Review of Systems Please see HPI otherwise negative  Objective: Vital Signs: There were no vitals taken for this visit.  Physical Exam General: Mechele Claude female in no acute distress.  Hard of hearing.   Psych alert and oriented x3. Ortho Exam Right upper extremity she has tenderness just proximal to the elbow.  No rashes skin lesions ulcerations ecchymosis no  significant edema.  There is no tenting of the skin.  Radial pulses intact.  She has good range of motion of the hand without significant pain.  Sensation grossly intact throughout the hand.  Elbow is at approximately 82 to 82 degrees of flexion.  No attempts at movement or performed today. Specialty Comments:  No specialty comments available.  Imaging: Xr Elbow 2 Views Right  Result Date: 12/16/2017 2 views right elbow: Films are slightly suboptimal due to positioning limits due to patient's pain.  Significant callus formation of the distal metaphysis and the condylar fracture.  Overall acceptable alignment.  Slight shortening of the distal humeral shaft.  Otherwise no acute fracture or bony abnormalities.      PMFS History: Patient Active Problem List   Diagnosis Date Noted  . Multiple rib fractures 05/09/2016  . Fall 05/09/2016  . Leukocytosis 05/09/2016  . Chest pain 03/13/2015  . Other specified fever   . Pain in the chest   . Abdominal pain 10/28/2014  . Anemia 10/28/2014  . Hypokalemia 10/28/2014  . Protein-calorie malnutrition, severe ([place]) 10/28/2014  . Adynamic ileus ([place]) 10/28/2014  . Dyspnea 09/18/2014  . Nausea and vomiting 09/18/2014  . Cellulitis 09/02/2014  . Community acquired pneumonia 08/14/2014  . Acute respiratory failure with hypoxia ([place]) 08/13/2014  .  Macrocytic anemia 08/13/2014  . Chronic diastolic CHF (congestive heart failure) ([place]) 08/13/2014  . CAP (community acquired pneumonia) 08/12/2014  . PUD (peptic ulcer disease) 10/05/2013  . Acute gastric ulcer due to Helicobacter pylori 16/11/3708  . Right kidney stone 12/31/2012  . Hypotension 12/31/2012  . Pancreatic atrophy 12/31/2012   Past Medical History:  Diagnosis Date  . Anemia   . Arthritis   . Cancer ([place])    uterine surg only  . CHF (congestive heart failure) ([place])   . Colitis 12/31/2012   presumed C.Diff. patient declined colonoscopy  . Hypotension   . Kidney stone   . Muscle  weakness (generalized)   . Osteoarthritis   . Pancreatic atrophy 12/31/2012  . Pneumonia     Family History  Problem Relation Age of Onset  . Cancer Mother     Past Surgical History:  Procedure Laterality Date  . ABDOMINAL HYSTERECTOMY    . CHOLECYSTECTOMY    . ESOPHAGOGASTRODUODENOSCOPY N/A 03/03/2013   RMR: Significant gastric ulcer without bleeding stigmata requiring no endoscopic intervention today. Noncritical Schatzki's ring. Small hiatal hernia  . ESOPHAGOGASTRODUODENOSCOPY N/A 08/24/2013   Dr. [name]: Hiatal hernia.  Gastric mucosal scar formation at site of previously noted gastric ulcerations  . JOINT REPLACEMENT    . LEFT KNEE SURGERY AND REVISION    . RIGHT HIP SURGERY     Social History   Occupational History  . Not on file  Tobacco Use  . Smoking status: Former Smoker    Types: Cigarettes    Last attempt to quit: 04/25/1951    Years since quitting: 66.6  . Smokeless tobacco: Never Used  Substance and Sexual Activity  . Alcohol use: No    Alcohol/week: 0.6 oz    Types: 1 Glasses of wine per week  . Drug use: No  . Sexual activity: Never

## 2017-12-22 ENCOUNTER — Telehealth: Payer: Self-pay | Admitting: Orthopaedic Surgery

## 2017-12-22 NOTE — Telephone Encounter (Signed)
Call received from patient and caregiver, Earlie Server "Dottie" Ragan (DPR form on file), asking for [place] to verify whether patient is to continue care with Dr Marlou Sa at [facility] - said she initially understood that it may have been for "a second opinion only." Ph# [phone] or [phone]

## 2017-12-23 NOTE — Telephone Encounter (Signed)
Depends on what opinion Dr. Marlou Sa has.

## 2017-12-23 NOTE — Telephone Encounter (Signed)
Notified patient/caregiver (designated party contact). Voiced understanding.

## 2017-12-23 NOTE — Telephone Encounter (Signed)
May the notes/reports in patient's chart in [facility] system be reviewed?

## 2017-12-23 NOTE — Telephone Encounter (Signed)
I do not understand.  She can look up in [place].  She needs to see Dr. Marlou Sa.

## 2018-01-04 ENCOUNTER — Encounter (INDEPENDENT_AMBULATORY_CARE_PROVIDER_SITE_OTHER): Payer: Self-pay | Admitting: Orthopaedic Surgery

## 2018-01-04 ENCOUNTER — Ambulatory Visit (INDEPENDENT_AMBULATORY_CARE_PROVIDER_SITE_OTHER): Payer: Medicare Other

## 2018-01-04 ENCOUNTER — Ambulatory Visit (INDEPENDENT_AMBULATORY_CARE_PROVIDER_SITE_OTHER): Payer: Medicare Other | Admitting: Orthopaedic Surgery

## 2018-01-04 DIAGNOSIS — M25521 Pain in right elbow: Secondary | ICD-10-CM | POA: Diagnosis not present

## 2018-01-04 DIAGNOSIS — S42401D Unspecified fracture of lower end of right humerus, subsequent encounter for fracture with routine healing: Secondary | ICD-10-CM | POA: Diagnosis not present

## 2018-01-04 NOTE — Progress Notes (Signed)
The patient is a 84 year old female who is about 6 weeks status post a mechanical fall in which she sustained a left distal humerus fracture.  She is been in a splint and only had follow-up with [place] about 3 weeks ago.  She does have severe arthritis in her right shoulder.  She denies any right elbow pain.  The splint should let me flex and extend her elbow with only discomfort in the shoulder.  I did not feel significant instability of the fracture itself.  2 views of the right distal humerus and elbow are obtained and show abundant interval healing with callus formation with a well located elbow.  The fracture is intra-articular.  This point she can be out of the splint but will hold onto it if she does have pain.  I talked to her daughter and all question concerns were answered and addressed.  We will see her back at least one more visit in 4 weeks with a repeat 2 views of the right elbow showing some of the distal humerus as well.

## 2018-02-03 ENCOUNTER — Encounter (INDEPENDENT_AMBULATORY_CARE_PROVIDER_SITE_OTHER): Payer: Self-pay | Admitting: Orthopaedic Surgery

## 2018-02-03 ENCOUNTER — Ambulatory Visit (INDEPENDENT_AMBULATORY_CARE_PROVIDER_SITE_OTHER): Payer: Medicare Other | Admitting: Orthopaedic Surgery

## 2018-02-03 ENCOUNTER — Ambulatory Visit (INDEPENDENT_AMBULATORY_CARE_PROVIDER_SITE_OTHER): Payer: Self-pay

## 2018-02-03 DIAGNOSIS — S42401D Unspecified fracture of lower end of right humerus, subsequent encounter for fracture with routine healing: Secondary | ICD-10-CM

## 2018-02-03 NOTE — Progress Notes (Signed)
The patient is now about 10 weeks status post a distal humerus fracture of her right arm.  She is a 84 year old is mainly wheelchair-bound.  She has debilitating rotator cuff arthropathy of both shoulders.  We have treated this nonoperatively with splinting.  She is out of the splint now.  She is very hard of hearing.  Her family is with her today.  On exam both shoulders are what hurt her more from her arthropathy with severe grinding of the glenohumeral joint of both shoulders.  Examination of her right elbow shows is well located.  There is no soft tissue compromise from her fracture.  She has painful range of motion of the elbow but the distal humerus moves as a unit.  X-rays confirm healing of the distal humerus fracture with severe rotator cuff arthropathy of both shoulders.  At this point there is nothing else that I would recommend for her.  She is healing the fracture nicely and that is not causing as much pain is her shoulders.  At this point follow-up will be as needed.  All question concerns were answered and addressed.

## 2018-02-25 ENCOUNTER — Emergency Department (HOSPITAL_COMMUNITY): Payer: Medicare Other

## 2018-02-25 ENCOUNTER — Emergency Department (HOSPITAL_COMMUNITY)
Admission: EM | Admit: 2018-02-25 | Discharge: 2018-02-25 | Disposition: A | Discharge status: Home / Self Care | Payer: Medicare Other | Source: Home / Self Care | Attending: Emergency Medicine | Admitting: Emergency Medicine

## 2018-02-25 ENCOUNTER — Other Ambulatory Visit: Payer: Self-pay

## 2018-02-25 ENCOUNTER — Encounter (HOSPITAL_COMMUNITY): Payer: Self-pay

## 2018-02-25 ENCOUNTER — Emergency Department (HOSPITAL_COMMUNITY)
Admission: EM | Admit: 2018-02-25 | Discharge: 2018-02-25 | Disposition: A | Discharge status: Home / Self Care | Payer: Medicare Other | Attending: Emergency Medicine | Admitting: Emergency Medicine

## 2018-02-25 ENCOUNTER — Encounter (HOSPITAL_COMMUNITY): Payer: Self-pay | Admitting: Emergency Medicine

## 2018-02-25 DIAGNOSIS — I110 Hypertensive heart disease with heart failure: Secondary | ICD-10-CM | POA: Insufficient documentation

## 2018-02-25 DIAGNOSIS — Z8541 Personal history of malignant neoplasm of cervix uteri: Secondary | ICD-10-CM | POA: Insufficient documentation

## 2018-02-25 DIAGNOSIS — K5641 Fecal impaction: Secondary | ICD-10-CM | POA: Diagnosis not present

## 2018-02-25 DIAGNOSIS — Z87891 Personal history of nicotine dependence: Secondary | ICD-10-CM

## 2018-02-25 DIAGNOSIS — I5032 Chronic diastolic (congestive) heart failure: Secondary | ICD-10-CM | POA: Insufficient documentation

## 2018-02-25 DIAGNOSIS — Z79899 Other long term (current) drug therapy: Secondary | ICD-10-CM

## 2018-02-25 DIAGNOSIS — R110 Nausea: Secondary | ICD-10-CM | POA: Insufficient documentation

## 2018-02-25 DIAGNOSIS — K5900 Constipation, unspecified: Secondary | ICD-10-CM

## 2018-02-25 DIAGNOSIS — Z9660 Presence of unspecified orthopedic joint implant: Secondary | ICD-10-CM | POA: Insufficient documentation

## 2018-02-25 DIAGNOSIS — R1084 Generalized abdominal pain: Secondary | ICD-10-CM | POA: Insufficient documentation

## 2018-02-25 LAB — CBC WITH DIFFERENTIAL/PLATELET
ABS IMMATURE GRANULOCYTES: 0.05 10*3/uL (ref 0.00–0.07)
Basophils Absolute: 0 10*3/uL (ref 0.0–0.1)
Basophils Relative: 0 %
EOS ABS: 0 10*3/uL (ref 0.0–0.5)
Eosinophils Relative: 0 %
HEMATOCRIT: 38.5 % (ref 36.0–46.0)
Hemoglobin: 12.5 g/dL (ref 12.0–15.0)
IMMATURE GRANULOCYTES: 1 %
Lymphocytes Relative: 7 %
Lymphs Abs: 0.8 10*3/uL (ref 0.7–4.0)
MCH: 33.2 pg (ref 26.0–34.0)
MCHC: 32.5 g/dL (ref 30.0–36.0)
MCV: 102.1 fL — AB (ref 80.0–100.0)
Monocytes Absolute: 0.7 10*3/uL (ref 0.1–1.0)
Monocytes Relative: 7 %
NEUTROS PCT: 85 %
Neutro Abs: 9.3 10*3/uL — ABNORMAL HIGH (ref 1.7–7.7)
Platelets: 247 10*3/uL (ref 150–400)
RBC: 3.77 MIL/uL — ABNORMAL LOW (ref 3.87–5.11)
RDW: 13.2 % (ref 11.5–15.5)
WBC: 10.9 10*3/uL — ABNORMAL HIGH (ref 4.0–10.5)
nRBC: 0 % (ref 0.0–0.2)

## 2018-02-25 LAB — URINALYSIS, ROUTINE W REFLEX MICROSCOPIC
BILIRUBIN URINE: NEGATIVE
Bacteria, UA: NONE SEEN
GLUCOSE, UA: NEGATIVE mg/dL
Ketones, ur: 5 mg/dL — AB
Leukocytes, UA: NEGATIVE
Nitrite: NEGATIVE
PH: 7 (ref 5.0–8.0)
PROTEIN: NEGATIVE mg/dL
Specific Gravity, Urine: 1.018 (ref 1.005–1.030)

## 2018-02-25 LAB — COMPREHENSIVE METABOLIC PANEL
ALT: 13 U/L (ref 0–44)
AST: 22 U/L (ref 15–41)
Albumin: 4.6 g/dL (ref 3.5–5.0)
Alkaline Phosphatase: 52 U/L (ref 38–126)
Anion gap: 9 (ref 5–15)
BILIRUBIN TOTAL: 1.1 mg/dL (ref 0.3–1.2)
BUN: 11 mg/dL (ref 8–23)
CALCIUM: 9.1 mg/dL (ref 8.9–10.3)
CO2: 26 mmol/L (ref 22–32)
CREATININE: 0.64 mg/dL (ref 0.44–1.00)
Chloride: 102 mmol/L (ref 98–111)
GFR calc Af Amer: >60 mL/min (ref >60)
Glucose, Bld: 116 mg/dL — ABNORMAL HIGH (ref 70–99)
Potassium: 3.6 mmol/L (ref 3.5–5.1)
Sodium: 137 mmol/L (ref 135–145)
TOTAL PROTEIN: 7 g/dL (ref 6.5–8.1)

## 2018-02-25 MED ORDER — FLEET ENEMA 7-19 GM/118ML RE ENEM
1.0000 | ENEMA | Freq: Once | RECTAL | Status: AC
  Administered 2018-02-25: 1 via RECTAL

## 2018-02-25 MED ORDER — SODIUM CHLORIDE 0.9 % IV BOLUS
500.0000 mL | Freq: Once | INTRAVENOUS | Status: AC
  Administered 2018-02-25: 500 mL via INTRAVENOUS

## 2018-02-25 MED ORDER — IOPAMIDOL (ISOVUE-300) INJECTION 61%
100.0000 mL | Freq: Once | INTRAVENOUS | Status: AC | PRN
  Administered 2018-02-25: 100 mL via INTRAVENOUS

## 2018-02-25 NOTE — Discharge Instructions (Signed)
Take over the counter laxative (such as miralax, milk of magnesia, senokot) AND a dulcolax suppository (or enema) today and repeat both tomorrow.  Begin to take over the counter stool softener (ie: colace, miralax), as directed on packaging, for the next month.  Continue to take your usual prescriptions as previously directed.  Call your regular medical doctor today to schedule a follow up appointment within the next 2 days.  Return to the Emergency Department immediately if worsening.

## 2018-02-25 NOTE — ED Triage Notes (Signed)
Pt on chronic opiate use for severe scoliosis c/o constipation. Reports last BM on Saturday. Hx of fecal impaction. Pt from home, has home health that comes daily to give patient medications. Pt hard of hearing. Denies abdominal pain.

## 2018-02-25 NOTE — ED Triage Notes (Signed)
Patient was seen this morning in [facility] ED for constipation. Patient was discharge home. Returned with complaint of nausea and vomiting with generalized weakness.

## 2018-02-25 NOTE — ED Notes (Signed)
Pt informed of needed urine specimen and stated she was unable to go at this time. Pt to inform us when she is able to go

## 2018-02-25 NOTE — ED Notes (Signed)
Patient transported to X-ray 

## 2018-02-25 NOTE — ED Notes (Addendum)
Healthcare Power of Attorney: daughterLeda Gauze [name] [phone]

## 2018-02-25 NOTE — Discharge Instructions (Addendum)
Your Ct scan is normal.  Continue your current medications.  See your Physician for recheck next week

## 2018-02-25 NOTE — ED Provider Notes (Signed)
[facility] EMERGENCY DEPARTMENT Provider Note   CSN: [id number] Arrival date & time: 02/25/18  1743     History   Chief Complaint Chief Complaint  Patient presents with  . Nausea    HPI [name] is a 84 y.o. female.  The history is provided by the patient. No language interpreter was used.  Abdominal Pain   This is a new problem. The current episode started 6 to 12 hours ago. The problem occurs constantly. The problem has not changed since onset.Associated with: constipation. The pain is located in the generalized abdominal region. Associated symptoms include constipation. Pertinent negatives include anorexia and diarrhea. Nothing aggravates the symptoms. Nothing relieves the symptoms.  Pt was seen here earlier today for constipation.  Pt was on toliet at home and had severe abdominal pain   Past Medical History:  Diagnosis Date  . Anemia   . Arthritis   . Cancer ([place])    uterine surg only  . CHF (congestive heart failure) ([place])   . Colitis 12/31/2012   presumed C.Diff. patient declined colonoscopy  . Hypotension   . Kidney stone   . Muscle weakness (generalized)   . Osteoarthritis   . Pancreatic atrophy 12/31/2012  . Pneumonia     Patient Active Problem List   Diagnosis Date Noted  . Closed fracture of distal end of right humerus with routine healing 01/04/2018  . Multiple rib fractures 05/09/2016  . Fall 05/09/2016  . Leukocytosis 05/09/2016  . Chest pain 03/13/2015  . Other specified fever   . Pain in the chest   . Abdominal pain 10/28/2014  . Anemia 10/28/2014  . Hypokalemia 10/28/2014  . Protein-calorie malnutrition, severe ([place]) 10/28/2014  . Adynamic ileus ([place]) 10/28/2014  . Dyspnea 09/18/2014  . Nausea and vomiting 09/18/2014  . Cellulitis 09/02/2014  . Community acquired pneumonia 08/14/2014  . Acute respiratory failure with hypoxia ([place]) 08/13/2014  . Macrocytic anemia 08/13/2014  . Chronic diastolic CHF (congestive heart failure) ([place])  08/13/2014  . CAP (community acquired pneumonia) 08/12/2014  . PUD (peptic ulcer disease) 10/05/2013  . Acute gastric ulcer due to Helicobacter pylori 62/26/3335  . Right kidney stone 12/31/2012  . Hypotension 12/31/2012  . Pancreatic atrophy 12/31/2012    Past Surgical History:  Procedure Laterality Date  . ABDOMINAL HYSTERECTOMY    . CHOLECYSTECTOMY    . ESOPHAGOGASTRODUODENOSCOPY N/A 03/03/2013   RMR: Significant gastric ulcer without bleeding stigmata requiring no endoscopic intervention today. Noncritical Schatzki's ring. Small hiatal hernia  . ESOPHAGOGASTRODUODENOSCOPY N/A 08/24/2013   Dr. [name]: Hiatal hernia.  Gastric mucosal scar formation at site of previously noted gastric ulcerations  . JOINT REPLACEMENT    . LEFT KNEE SURGERY AND REVISION    . RIGHT HIP SURGERY       OB History    Gravida  3   Para  2   Term  2   Preterm      AB  1   Living        SAB  1   TAB      Ectopic      Multiple      Live Births               Home Medications    Prior to Admission medications   Medication Sig Start Date End Date Taking? Authorizing Provider  acetaminophen (TYLENOL) 500 MG tablet Take 500-1,000 mg by mouth every 6 (six) hours as needed for mild pain, moderate pain, fever or headache.  Yes [provider]  albuterol (PROAIR HFA) 108 (90 BASE) MCG/ACT inhaler Inhale 1-2 puffs into the lungs every 6 (six) hours as needed for wheezing or shortness of breath.   Yes [provider]  ALPRAZolam (XANAX) 0.25 MG tablet Take 0.25 mg by mouth daily as needed for anxiety.  06/10/16  Yes [provider]  alum & mag hydroxide-simeth (MAALOX REGULAR STRENGTH) 010-272-53 MG/5ML suspension Take 15 mLs by mouth every 6 (six) hours as needed for indigestion or heartburn. 08/16/16  Yes Kinnie Feil, PA-C  diclofenac sodium (VOLTAREN) 1 % GEL Apply 2 g topically 4 (four) times daily.  08/18/17  Yes [provider]  docusate sodium  (COLACE) 100 MG capsule Take 100 mg by mouth daily.    Yes [provider]  oxyCODONE-acetaminophen (PERCOCET) 10-325 MG tablet Take 1 tablet by mouth every 6 (six) hours as needed for pain.  11/24/17  Yes [provider]  pantoprazole (PROTONIX) 40 MG tablet Take 40 mg by mouth daily.    Yes [provider]  polyethylene glycol (MIRALAX / GLYCOLAX) packet Take 17 g by mouth daily.    Yes [provider]  furosemide (LASIX) 20 MG tablet Take 1 tablet (20 mg total) by mouth daily. Patient not taking: Reported on 12/03/2017 11/08/17   Virgel Manifold, MD  potassium chloride SA (K-DUR,KLOR-CON) 20 MEQ tablet Take 1 tablet (20 mEq total) by mouth 2 (two) times daily. Patient not taking: Reported on 12/03/2017 11/08/17   Virgel Manifold, MD    Family History Family History  Problem Relation Age of Onset  . Cancer Mother     Social History Social History   Tobacco Use  . Smoking status: Former Smoker    Types: Cigarettes    Last attempt to quit: 04/25/1951    Years since quitting: 66.8  . Smokeless tobacco: Never Used  Substance Use Topics  . Alcohol use: No    Alcohol/week: 1.0 standard drinks    Types: 1 Glasses of wine per week  . Drug use: No     Allergies   Sulfa antibiotics and Aspirin   Review of Systems Review of Systems  Gastrointestinal: Positive for abdominal pain and constipation. Negative for anorexia and diarrhea.  All other systems reviewed and are negative.    Physical Exam Updated Vital Signs BP (!) 158/69   Pulse 64   Temp 98.1 F (36.7 C) (Oral)   Resp 16   SpO2 99%   Physical Exam  Constitutional: She is oriented to person, place, and time. She appears well-developed and well-nourished.  HENT:  Head: Normocephalic.  Right Ear: External ear normal.  Left Ear: External ear normal.  Eyes: Pupils are equal, round, and reactive to light. EOM are normal.  Neck: Normal range of motion.  Cardiovascular: Normal rate and  regular rhythm.  Pulmonary/Chest: Effort normal.  Abdominal: Soft. She exhibits no distension. There is no tenderness.  Musculoskeletal: Normal range of motion.  Neurological: She is alert and oriented to person, place, and time.  Skin: Skin is warm.  Psychiatric: She has a normal mood and affect.  Nursing note and vitals reviewed.    ED Treatments / Results  Labs (all labs ordered are listed, but only abnormal results are displayed) Labs Reviewed  CBC WITH DIFFERENTIAL/PLATELET - Abnormal; Notable for the following components:      Result Value   WBC 10.9 (*)    RBC 3.77 (*)    MCV 102.1 (*)    Neutro  Abs 9.3 (*)    All other components within normal limits  COMPREHENSIVE METABOLIC PANEL - Abnormal; Notable for the following components:   Glucose, Bld 116 (*)    All other components within normal limits  URINALYSIS, ROUTINE W REFLEX MICROSCOPIC - Abnormal; Notable for the following components:   Color, Urine STRAW (*)    Hgb urine dipstick SMALL (*)    Ketones, ur 5 (*)    All other components within normal limits    EKG None  Radiology Ct Abdomen Pelvis W Contrast  Result Date: 02/25/2018 CLINICAL DATA:  Nausea vomiting and generalized weakness EXAM: CT ABDOMEN AND PELVIS WITH CONTRAST TECHNIQUE: Multidetector CT imaging of the abdomen and pelvis was performed using the standard protocol following bolus administration of intravenous contrast. CONTRAST:  152mL ISOVUE-300 IOPAMIDOL (ISOVUE-300) INJECTION 61% COMPARISON:  Radiograph 02/25/2018, CT 10/27/2014, 07/27/2014 FINDINGS: Lower chest: Lung bases demonstrate no acute consolidation or pleural effusion. Heart size is normal. Hepatobiliary: Status post cholecystectomy. Intra and extrahepatic biliary enlargement, not significantly changed. No focal hepatic abnormality. Pancreas: Atrophic. No pancreatic ductal dilatation or surrounding inflammatory changes. Spleen: Normal in size without focal abnormality. Adrenals/Urinary  Tract: Adrenal glands are within normal limits. Kidneys show no hydronephrosis. 13 mm stone lower pole right kidney with additional smaller stone in the mid to lower pole. Bladder normal Stomach/Bowel: Previously noted feces in the rectum has been a vacuum weighted. No dilated small bowel to suggest obstruction. The stomach is within normal limits. No bowel wall thickening. Appendix not well seen but no right lower quadrant inflammatory process. Vascular/Lymphatic: Nonaneurysmal aorta. Moderate-to-marked aortic atherosclerosis. No significantly enlarged lymph nodes. Reproductive: Status post hysterectomy. No adnexal masses. Other: No free air or free fluid. Musculoskeletal: Severe scoliosis of the spine. Status post right hip replacement with marked protrusio deformity of the right acetabulum slightly progressed since prior CT. Old left pubic rami fractures. IMPRESSION: 1. No CT evidence for acute intra-abdominal or pelvic abnormality. Negative for bowel obstruction, free air, or bowel wall thickening 2. Status post cholecystectomy with stable intra and extrahepatic biliary enlargement 3. Right kidney stones without hydronephrosis Electronically Signed   By: Donavan Foil M.D.   On: 02/25/2018 20:49   Dg Abd Acute W/chest  Result Date: 02/25/2018 CLINICAL DATA:  84 year old female with constipation. EXAM: DG ABDOMEN ACUTE W/ 1V CHEST COMPARISON:  Chest radiographs 02/26/2017. Abdominal radiographs 09/13/2015. FINDINGS: Seated upright AP view of the chest and abdomen with supine views of the abdomen and pelvis. Severe dextroconvex thoracolumbar scoliosis redemonstrated. Stable lung volumes and mediastinal contours. No pneumothorax, pulmonary edema or confluent pulmonary opacity. Stable surgical clips in all 4 quadrants of the abdomen. 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 No pneumoperitoneum. Non obstructed bowel gas pattern. Stool ball in the rectum. Chronic right renal hilum calcification is  stable. IMPRESSION: 1. Stool ball in the rectum raising the possibility of fecal impaction. 2.  Normal bowel gas pattern, no free air. 3.  No acute cardiopulmonary abnormality. 4. Moderate to severe dextroconvex scoliosis. Chronic right nephrolithiasis. Electronically Signed   By: Genevie Ann M.D.   On: 02/25/2018 10:41    Procedures Procedures (including critical care time)  Medications Ordered in ED Medications  sodium chloride 0.9 % bolus 500 mL (0 mLs Intravenous Stopped 02/25/18 2104)  iopamidol (ISOVUE-300) 61 % injection 100 mL (100 mLs Intravenous Contrast Given 02/25/18 2011)     Initial Impression / Assessment and Plan / ED Course  I have reviewed the triage vital signs and the nursing  notes.  Pertinent labs & imaging results that were available during my care of the patient were reviewed by me and considered in my medical decision making (see chart for details).     MDM Ct scan no acute abnormality,  Labs normal.    Dr. Laverta Baltimore in to see and examine.  Pt advised of results.  I advised recheck with primary next week.  Constipation has resolved.   Final Clinical Impressions(s) / ED Diagnoses   Final diagnoses:  Nausea    ED Discharge Orders    None    An After Visit Summary was printed and given to the patient.    Fransico Meadow, PA-C 02/25/18 2212    Margette Fast, MD 02/26/18 1025

## 2018-02-25 NOTE — ED Provider Notes (Signed)
[facility] EMERGENCY DEPARTMENT Provider Note   CSN: [id number] Arrival date & time: 02/25/18  0840     History   Chief Complaint Chief Complaint  Patient presents with  . Constipation    HPI [name] is a 84 y.o. female.  HPI  Pt was seen at 0925. Per pt and her caregiver, c/o gradual onset and persistence of constant "constipation" for the past 2 days. Pt states she feels like "the stool is stuck" in her rectum. Pt's caregiver states pt refused an enema at home. Endorses hx of chronic constipation due to opiate use. Denies abd pain, no black or blood in stools, no N/V, no rectal pain, no CP/SOB, no fevers.   Past Medical History:  Diagnosis Date  . Anemia   . Arthritis   . Cancer ([place])    uterine surg only  . CHF (congestive heart failure) ([place])   . Colitis 12/31/2012   presumed C.Diff. patient declined colonoscopy  . Hypotension   . Kidney stone   . Muscle weakness (generalized)   . Osteoarthritis   . Pancreatic atrophy 12/31/2012  . Pneumonia     Patient Active Problem List   Diagnosis Date Noted  . Closed fracture of distal end of right humerus with routine healing 01/04/2018  . Multiple rib fractures 05/09/2016  . Fall 05/09/2016  . Leukocytosis 05/09/2016  . Chest pain 03/13/2015  . Other specified fever   . Pain in the chest   . Abdominal pain 10/28/2014  . Anemia 10/28/2014  . Hypokalemia 10/28/2014  . Protein-calorie malnutrition, severe ([place]) 10/28/2014  . Adynamic ileus ([place]) 10/28/2014  . Dyspnea 09/18/2014  . Nausea and vomiting 09/18/2014  . Cellulitis 09/02/2014  . Community acquired pneumonia 08/14/2014  . Acute respiratory failure with hypoxia ([place]) 08/13/2014  . Macrocytic anemia 08/13/2014  . Chronic diastolic CHF (congestive heart failure) ([place]) 08/13/2014  . CAP (community acquired pneumonia) 08/12/2014  . PUD (peptic ulcer disease) 10/05/2013  . Acute gastric ulcer due to Helicobacter pylori 35/57/3220  . Right kidney  stone 12/31/2012  . Hypotension 12/31/2012  . Pancreatic atrophy 12/31/2012    Past Surgical History:  Procedure Laterality Date  . ABDOMINAL HYSTERECTOMY    . CHOLECYSTECTOMY    . ESOPHAGOGASTRODUODENOSCOPY N/A 03/03/2013   RMR: Significant gastric ulcer without bleeding stigmata requiring no endoscopic intervention today. Noncritical Schatzki's ring. Small hiatal hernia  . ESOPHAGOGASTRODUODENOSCOPY N/A 08/24/2013   Dr. [name]: Hiatal hernia.  Gastric mucosal scar formation at site of previously noted gastric ulcerations  . JOINT REPLACEMENT    . LEFT KNEE SURGERY AND REVISION    . RIGHT HIP SURGERY       OB History    Gravida  3   Para  2   Term  2   Preterm      AB  1   Living        SAB  1   TAB      Ectopic      Multiple      Live Births               Home Medications    Prior to Admission medications   Medication Sig Start Date End Date Taking? Authorizing Provider  acetaminophen (TYLENOL) 500 MG tablet Take 500-1,000 mg by mouth every 6 (six) hours as needed for mild pain, moderate pain, fever or headache.     [provider]  albuterol (PROAIR HFA) 108 (90 BASE) MCG/ACT inhaler Inhale 1-2 puffs into  the lungs every 6 (six) hours as needed for wheezing or shortness of breath.    [provider]  ALPRAZolam Duanne Moron) 0.25 MG tablet Take 0.25 mg by mouth daily as needed for anxiety.  06/10/16   [provider]  alum & mag hydroxide-simeth (MAALOX REGULAR STRENGTH) 200-200-20 MG/5ML suspension Take 15 mLs by mouth every 6 (six) hours as needed for indigestion or heartburn. 08/16/16   Kinnie Feil, PA-C  diclofenac sodium (VOLTAREN) 1 % GEL Apply 2 g topically 4 (four) times daily.  08/18/17   [provider]  docusate sodium (COLACE) 100 MG capsule Take 100 mg by mouth daily.     [provider]  furosemide (LASIX) 20 MG tablet Take 1 tablet (20 mg total) by mouth daily. Patient not taking: Reported on 12/03/2017  11/08/17   Virgel Manifold, MD  gabapentin (NEURONTIN) 100 MG capsule Take 1 capsule by mouth at bedtime as needed (pain).  06/10/16   [provider]  oxyCODONE-acetaminophen (PERCOCET) 10-325 MG tablet  11/24/17   [provider]  pantoprazole (PROTONIX) 40 MG tablet Take 40 mg by mouth daily.     [provider]  polyethylene glycol (MIRALAX / GLYCOLAX) packet Take 17 g by mouth daily.     [provider]  potassium chloride SA (K-DUR,KLOR-CON) 20 MEQ tablet Take 1 tablet (20 mEq total) by mouth 2 (two) times daily. Patient not taking: Reported on 12/03/2017 11/08/17   Virgel Manifold, MD    Family History Family History  Problem Relation Age of Onset  . Cancer Mother     Social History Social History   Tobacco Use  . Smoking status: Former Smoker    Types: Cigarettes    Last attempt to quit: 04/25/1951    Years since quitting: 66.8  . Smokeless tobacco: Never Used  Substance Use Topics  . Alcohol use: No    Alcohol/week: 1.0 standard drinks    Types: 1 Glasses of wine per week  . Drug use: No     Allergies   Sulfa antibiotics and Aspirin   Review of Systems Review of Systems ROS: Statement: All systems negative except as marked or noted in the HPI; Constitutional: Negative for fever and chills. ; ; Eyes: Negative for eye pain, redness and discharge. ; ; ENMT: Negative for ear pain, hoarseness, nasal congestion, sinus pressure and sore throat. ; ; Cardiovascular: Negative for chest pain, palpitations, diaphoresis, dyspnea and peripheral edema. ; ; Respiratory: Negative for cough, wheezing and stridor. ; ; Gastrointestinal: +constipation. Negative for nausea, vomiting, diarrhea, abdominal pain, blood in stool, hematemesis, jaundice and rectal bleeding. . ; ; Genitourinary: Negative for dysuria, flank pain and hematuria. ; ; Musculoskeletal: Negative for back pain and neck pain. Negative for swelling and trauma.; ; Skin: Negative for pruritus, rash,  abrasions, blisters, bruising and skin lesion.; ; Neuro: Negative for headache, lightheadedness and neck stiffness. Negative for weakness, altered level of consciousness, altered mental status, extremity weakness, paresthesias, involuntary movement, seizure and syncope.       Physical Exam Updated Vital Signs BP (!) 160/71   Pulse 66   Temp 98 F (36.7 C) (Oral)   Resp 16   Ht 4\' 6"  (1.372 m)   Wt 32.7 kg   SpO2 96%   BMI 17.36 kg/m   Physical Exam 0930: Physical examination:  Nursing notes reviewed; Vital signs and O2 SAT reviewed;  Constitutional: Thin, frail. In no acute distress; Head:  Normocephalic, atraumatic; Eyes: EOMI, PERRL, No scleral  icterus; ENMT: Mouth and pharynx normal, Mucous membranes moist; Neck: Supple, Full range of motion, No lymphadenopathy; Cardiovascular: Regular rate and rhythm, No gallop; Respiratory: Breath sounds clear & equal bilaterally, No wheezes.  Speaking full sentences with ease, Normal respiratory effort/excursion; Chest: Nontender, Movement normal; Abdomen: Soft, Nontender, Nondistended, Normal bowel sounds Rectal exam performed w/permission of pt and ED RN chaperone present.  Anal tone normal.  Non-tender, multiple very soft light brown stool balls in rectal vault. No bleeding, no fissures, no external hemorrhoids, no palp masses.;;; Genitourinary: No CVA tenderness; Extremities: Peripheral pulses normal, No tenderness, No edema, No calf edema or asymmetry.; Neuro: AA&Ox3, +very HOH. No facial droop. Speech clear. Moves all extremities on stretcher spontaneously and to command without apparent gross focal motor deficits..; Skin: Color normal, Warm, Dry.   ED Treatments / Results  Labs (all labs ordered are listed, but only abnormal results are displayed)   EKG None  Radiology   Procedures Procedures (including critical care time)  Medications Ordered in ED Medications  sodium phosphate (FLEET) 7-19 GM/118ML enema 1 enema (1 enema Rectal  Given 02/25/18 1152)     Initial Impression / Assessment and Plan / ED Course  I have reviewed the triage vital signs and the nursing notes.  Pertinent labs & imaging results that were available during my care of the patient were reviewed by me and considered in my medical decision making (see chart for details).  MDM Reviewed: previous chart, nursing note and vitals Interpretation: x-ray   Dg Abd Acute W/chest Result Date: 02/25/2018 CLINICAL DATA:  84 year old female with constipation. EXAM: DG ABDOMEN ACUTE W/ 1V CHEST COMPARISON:  Chest radiographs 02/26/2017. Abdominal radiographs 09/13/2015. FINDINGS: Seated upright AP view of the chest and abdomen with supine views of the abdomen and pelvis. Severe dextroconvex thoracolumbar scoliosis redemonstrated. Stable lung volumes and mediastinal contours. No pneumothorax, pulmonary edema or confluent pulmonary opacity. Stable surgical clips in all 4 quadrants of the abdomen. 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 No pneumoperitoneum. Non obstructed bowel gas pattern. Stool ball in the rectum. Chronic right renal hilum calcification is stable. IMPRESSION: 1. Stool ball in the rectum raising the possibility of fecal impaction. 2.  Normal bowel gas pattern, no free air. 3.  No acute cardiopulmonary abnormality. 4. Moderate to severe dextroconvex scoliosis. Chronic right nephrolithiasis. Electronically Signed   By: Genevie Ann M.D.   On: 02/25/2018 10:41    1215:  Pt with multiple very soft stool balls in rectal vault, attempted to disimpact with pt having small amount BM afterwards. Pt given fleets enema; pt passing multiple very soft small light brown stool balls. No rectal bleeding. Abd remains benign. Pt to continue bowel regimen at home. Dx and testing d/w pt and caregiver.  Questions answered.  Verb understanding, agreeable to d/c home with outpt f/u.   Final Clinical Impressions(s) / ED Diagnoses   Final diagnoses:  None     ED Discharge Orders    None       Francine Graven, DO 02/27/18 9563

## 2018-03-11 ENCOUNTER — Other Ambulatory Visit: Payer: Self-pay

## 2018-03-11 ENCOUNTER — Emergency Department (HOSPITAL_COMMUNITY): Payer: Medicare Other

## 2018-03-11 ENCOUNTER — Encounter (HOSPITAL_COMMUNITY): Payer: Self-pay

## 2018-03-11 ENCOUNTER — Emergency Department (HOSPITAL_COMMUNITY)
Admission: EM | Admit: 2018-03-11 | Discharge: 2018-03-11 | Disposition: A | Discharge status: Home / Self Care | Payer: Medicare Other | Attending: Emergency Medicine | Admitting: Emergency Medicine

## 2018-03-11 DIAGNOSIS — Z79899 Other long term (current) drug therapy: Secondary | ICD-10-CM | POA: Diagnosis not present

## 2018-03-11 DIAGNOSIS — Z87891 Personal history of nicotine dependence: Secondary | ICD-10-CM | POA: Diagnosis not present

## 2018-03-11 DIAGNOSIS — Y999 Unspecified external cause status: Secondary | ICD-10-CM | POA: Diagnosis not present

## 2018-03-11 DIAGNOSIS — S7001XA Contusion of right hip, initial encounter: Secondary | ICD-10-CM | POA: Diagnosis not present

## 2018-03-11 DIAGNOSIS — W01198A Fall on same level from slipping, tripping and stumbling with subsequent striking against other object, initial encounter: Secondary | ICD-10-CM | POA: Diagnosis not present

## 2018-03-11 DIAGNOSIS — I5032 Chronic diastolic (congestive) heart failure: Secondary | ICD-10-CM | POA: Diagnosis not present

## 2018-03-11 DIAGNOSIS — S20211A Contusion of right front wall of thorax, initial encounter: Secondary | ICD-10-CM

## 2018-03-11 DIAGNOSIS — Y93E8 Activity, other personal hygiene: Secondary | ICD-10-CM | POA: Diagnosis not present

## 2018-03-11 DIAGNOSIS — W19XXXA Unspecified fall, initial encounter: Secondary | ICD-10-CM

## 2018-03-11 DIAGNOSIS — Y92012 Bathroom of single-family (private) house as the place of occurrence of the external cause: Secondary | ICD-10-CM | POA: Diagnosis not present

## 2018-03-11 DIAGNOSIS — S79911A Unspecified injury of right hip, initial encounter: Secondary | ICD-10-CM | POA: Diagnosis present

## 2018-03-11 DIAGNOSIS — S2020XA Contusion of thorax, unspecified, initial encounter: Secondary | ICD-10-CM | POA: Diagnosis not present

## 2018-03-11 LAB — CBC
HCT: 36 % (ref 36.0–46.0)
Hemoglobin: 11.3 g/dL — ABNORMAL LOW (ref 12.0–15.0)
MCH: 32 pg (ref 26.0–34.0)
MCHC: 31.4 g/dL (ref 30.0–36.0)
MCV: 102 fL — ABNORMAL HIGH (ref 80.0–100.0)
NRBC: 0 % (ref 0.0–0.2)
PLATELETS: 203 10*3/uL (ref 150–400)
RBC: 3.53 MIL/uL — AB (ref 3.87–5.11)
RDW: 13.6 % (ref 11.5–15.5)
WBC: 10.1 10*3/uL (ref 4.0–10.5)

## 2018-03-11 LAB — BASIC METABOLIC PANEL
ANION GAP: 8 (ref 5–15)
BUN: 13 mg/dL (ref 8–23)
CALCIUM: 8.8 mg/dL — AB (ref 8.9–10.3)
CO2: 26 mmol/L (ref 22–32)
Chloride: 101 mmol/L (ref 98–111)
Creatinine, Ser: 0.76 mg/dL (ref 0.44–1.00)
GFR calc Af Amer: >60 mL/min (ref >60)
GFR calc non Af Amer: >60 mL/min (ref >60)
Glucose, Bld: 123 mg/dL — ABNORMAL HIGH (ref 70–99)
POTASSIUM: 3.6 mmol/L (ref 3.5–5.1)
Sodium: 135 mmol/L (ref 135–145)

## 2018-03-11 MED ORDER — FENTANYL CITRATE (PF) 100 MCG/2ML IJ SOLN
25.0000 ug | Freq: Once | INTRAMUSCULAR | Status: DC
  Filled 2018-03-11: qty 2

## 2018-03-11 MED ORDER — METHOCARBAMOL 500 MG PO TABS
500.0000 mg | ORAL_TABLET | Freq: Once | ORAL | Status: AC
  Administered 2018-03-11: 500 mg via ORAL
  Filled 2018-03-11: qty 1

## 2018-03-11 MED ORDER — METHOCARBAMOL 500 MG PO TABS: 500.0000 mg | ORAL_TABLET | Freq: Two times a day (BID) | ORAL | 0 refills | Status: AC | PRN

## 2018-03-11 NOTE — Discharge Instructions (Signed)
Your xrays don't show any broken bones We have treated your pain with a muscle relaxer called robaxin. This medicine can be sedating and given some side effects - you are ONLY to use this medicine if you have others around - 1 tablet every 12 hours as needed for ongoing muscle spasms. You should drink plenty of fluids and try to ambulate short distances such as to the bed and the bathroom only for the next 2 days You are not to walk without assistance from one of your Aids  ER for severe or worsening symptoms / pain / difficulty breathing.

## 2018-03-11 NOTE — ED Triage Notes (Signed)
Pt states she tripped over her walker today approx 1 pm, states she landed against the bars of the walker.   Pt c/o pain to right groin area and right ribcage.

## 2018-03-11 NOTE — ED Notes (Signed)
Patient transported to X-ray 

## 2018-03-11 NOTE — ED Provider Notes (Signed)
[facility] EMERGENCY DEPARTMENT Provider Note   CSN: [id number] Arrival date & time: 03/11/18  2044     History   Chief Complaint Chief Complaint  Patient presents with  . Fall    HPI [name] is a 84 y.o. female.  HPI  The patient is a 84 year old female, she has a known history of prior uterine cancer treated with surgery, history of congestive heart failure, diffuse muscle weakness and severe kyphosis of her back who currently lives at home, she has 24-hour in-home care and while she was at home in the bathroom trying to comb her hair today she lost her balance falling over her walker to the ground.  It is unclear whether she struck the ground with her hip but ever since the fall has had pain in her right groin.  The caregiver helped her up and she was able to ambulate with her walker, this was visualized by the caregiver several times today.  Ultimately this evening after the change of shift to the caregivers the patient started to complain of increasing pain in that right groin and was brought to the hospital for evaluation by paramedics.  They noted that there was a slight leg length discrepancy but due to severe kyphosis and patient positioning it was unclear whether this was abnormal for her.  She denies head injury, neck pain or and has no difficulty breathing but does complain of some right-sided lateral and posterior rib pain after the fall.  Past Medical History:  Diagnosis Date  . Anemia   . Arthritis   . Cancer ([place])    uterine surg only  . CHF (congestive heart failure) ([place])   . Colitis 12/31/2012   presumed C.Diff. patient declined colonoscopy  . Hypotension   . Kidney stone   . Muscle weakness (generalized)   . Osteoarthritis   . Pancreatic atrophy 12/31/2012  . Pneumonia     Patient Active Problem List   Diagnosis Date Noted  . Closed fracture of distal end of right humerus with routine healing 01/04/2018  . Multiple rib fractures 05/09/2016  .  Fall 05/09/2016  . Leukocytosis 05/09/2016  . Chest pain 03/13/2015  . Other specified fever   . Pain in the chest   . Abdominal pain 10/28/2014  . Anemia 10/28/2014  . Hypokalemia 10/28/2014  . Protein-calorie malnutrition, severe ([place]) 10/28/2014  . Adynamic ileus ([place]) 10/28/2014  . Dyspnea 09/18/2014  . Nausea and vomiting 09/18/2014  . Cellulitis 09/02/2014  . Community acquired pneumonia 08/14/2014  . Acute respiratory failure with hypoxia ([place]) 08/13/2014  . Macrocytic anemia 08/13/2014  . Chronic diastolic CHF (congestive heart failure) ([place]) 08/13/2014  . CAP (community acquired pneumonia) 08/12/2014  . PUD (peptic ulcer disease) 10/05/2013  . Acute gastric ulcer due to Helicobacter pylori 97/06/6376  . Right kidney stone 12/31/2012  . Hypotension 12/31/2012  . Pancreatic atrophy 12/31/2012    Past Surgical History:  Procedure Laterality Date  . ABDOMINAL HYSTERECTOMY    . CHOLECYSTECTOMY    . ESOPHAGOGASTRODUODENOSCOPY N/A 03/03/2013   RMR: Significant gastric ulcer without bleeding stigmata requiring no endoscopic intervention today. Noncritical Schatzki's ring. Small hiatal hernia  . ESOPHAGOGASTRODUODENOSCOPY N/A 08/24/2013   Dr. [name]: Hiatal hernia.  Gastric mucosal scar formation at site of previously noted gastric ulcerations  . JOINT REPLACEMENT    . LEFT KNEE SURGERY AND REVISION    . RIGHT HIP SURGERY       OB History    Gravida  3  Para  2   Term  2   Preterm      AB  1   Living        SAB  1   TAB      Ectopic      Multiple      Live Births               Home Medications    Prior to Admission medications   Medication Sig Start Date End Date Taking? Authorizing Provider  acetaminophen (TYLENOL) 500 MG tablet Take 500-1,000 mg by mouth every 6 (six) hours as needed for mild pain, moderate pain, fever or headache.     [provider]  albuterol (PROAIR HFA) 108 (90 BASE) MCG/ACT inhaler Inhale 1-2 puffs into the  lungs every 6 (six) hours as needed for wheezing or shortness of breath.    [provider]  ALPRAZolam Duanne Moron) 0.25 MG tablet Take 0.25 mg by mouth daily as needed for anxiety.  06/10/16   [provider]  alum & mag hydroxide-simeth (MAALOX REGULAR STRENGTH) 200-200-20 MG/5ML suspension Take 15 mLs by mouth every 6 (six) hours as needed for indigestion or heartburn. 08/16/16   Kinnie Feil, PA-C  diclofenac sodium (VOLTAREN) 1 % GEL Apply 2 g topically 4 (four) times daily.  08/18/17   [provider]  docusate sodium (COLACE) 100 MG capsule Take 100 mg by mouth daily.     [provider]  furosemide (LASIX) 20 MG tablet Take 1 tablet (20 mg total) by mouth daily. Patient not taking: Reported on 12/03/2017 11/08/17   Virgel Manifold, MD  methocarbamol (ROBAXIN) 500 MG tablet Take 1 tablet (500 mg total) by mouth 2 (two) times daily as needed for up to 7 days for muscle spasms. 03/11/18 03/18/18  Noemi Chapel, MD  oxyCODONE-acetaminophen (PERCOCET) 10-325 MG tablet Take 1 tablet by mouth every 6 (six) hours as needed for pain.  11/24/17   [provider]  pantoprazole (PROTONIX) 40 MG tablet Take 40 mg by mouth daily.     [provider]  polyethylene glycol (MIRALAX / GLYCOLAX) packet Take 17 g by mouth daily.     [provider]  potassium chloride SA (K-DUR,KLOR-CON) 20 MEQ tablet Take 1 tablet (20 mEq total) by mouth 2 (two) times daily. Patient not taking: Reported on 12/03/2017 11/08/17   Virgel Manifold, MD    Family History Family History  Problem Relation Age of Onset  . Cancer Mother     Social History Social History   Tobacco Use  . Smoking status: Former Smoker    Types: Cigarettes    Last attempt to quit: 04/25/1951    Years since quitting: 66.9  . Smokeless tobacco: Never Used  Substance Use Topics  . Alcohol use: No    Alcohol/week: 1.0 standard drinks    Types: 1 Glasses of wine per week  . Drug use: No      Allergies   Sulfa antibiotics and Aspirin   Review of Systems Review of Systems  All other systems reviewed and are negative.    Physical Exam Updated Vital Signs BP 119/74 (BP Location: Right Arm)   Pulse 76   Temp 98.3 F (36.8 C) (Oral)   Resp 15   Ht 1.397 m (4\' 7" )   Wt 32.7 kg   SpO2 96%   BMI 16.73 kg/m   Physical Exam  Constitutional: She appears well-developed and well-nourished. No distress.  HENT:  Head: Normocephalic and  atraumatic.  Mouth/Throat: Oropharynx is clear and moist. No oropharyngeal exudate.  Eyes: Pupils are equal, round, and reactive to light. Conjunctivae and EOM are normal. Right eye exhibits no discharge. Left eye exhibits no discharge. No scleral icterus.  Neck: Normal range of motion. Neck supple. No JVD present. No thyromegaly present.  Cardiovascular: Normal rate, regular rhythm, normal heart sounds and intact distal pulses. Exam reveals no gallop and no friction rub.  No murmur heard. Pulmonary/Chest: Effort normal and breath sounds normal. No respiratory distress. She has no wheezes. She has no rales. She exhibits tenderness ( ttp over teh R lower ribs, no sub Q emphyema or obvious deformity).  Abdominal: Soft. Bowel sounds are normal. She exhibits no distension and no mass. There is no tenderness.  Musculoskeletal: She exhibits tenderness. She exhibits no edema.  Slight leg length discrepancy on the R - has pain with Rom but able to SLR bilaterally though with pain - has normal appearing skin over hips and groin.  No ttp over the pelvic bones  Lymphadenopathy:    She has no cervical adenopathy.  Neurological: She is alert. Coordination normal.  Speech is clear, memory is intact - follows commands - hard of hearing  Skin: Skin is warm and dry. No rash noted. No erythema.  Psychiatric: She has a normal mood and affect. Her behavior is normal.  Nursing note and vitals reviewed.    ED Treatments / Results  Labs (all labs ordered  are listed, but only abnormal results are displayed) Labs Reviewed  CBC - Abnormal; Notable for the following components:      Result Value   RBC 3.53 (*)    Hemoglobin 11.3 (*)    MCV 102.0 (*)    All other components within normal limits  BASIC METABOLIC PANEL - Abnormal; Notable for the following components:   Glucose, Bld 123 (*)    Calcium 8.8 (*)    All other components within normal limits    EKG None  Radiology Dg Ribs Unilateral W/chest Right  Result Date: 03/11/2018 CLINICAL DATA:  Right mid rib pain post fall today. EXAM: RIGHT RIBS AND CHEST - 3+ VIEW COMPARISON:  02/25/2018 FINDINGS: Patient is rotated to the left. There is severe curvature of the thoracic spine convex right unchanged. Lungs are adequately inflated and otherwise clear. No right rib fracture. Remainder of the exam is unchanged. IMPRESSION: No acute findings. Electronically Signed   By: Marin Olp M.D.   On: 03/11/2018 22:09   Dg Hip Unilat W Or Wo Pelvis 2-3 Views Right  Result Date: 03/11/2018 CLINICAL DATA:  Right groin pain after tripped and fell today. EXAM: DG HIP (WITH OR WITHOUT PELVIS) 2-3V RIGHT COMPARISON:  KUB 02/25/2018 and CT 02/25/2018 FINDINGS: Right total hip arthroplasty unchanged. Diffuse osteopenia is present. Old left pubic rami fractures. Mild degenerate change of the left hip. No acute fracture or dislocation. Moderate degenerate change of the spine. Surgical clips over the lower abdomen and left pelvis. Right renal stone. IMPRESSION: No acute findings. Diffuse osteopenia. Right total hip arthroplasty intact and unchanged. Electronically Signed   By: Marin Olp M.D.   On: 03/11/2018 22:07    Procedures Procedures (including critical care time)  Medications Ordered in ED Medications  fentaNYL (SUBLIMAZE) injection 25 mcg (0 mcg Intravenous Hold 03/11/18 2105)  methocarbamol (ROBAXIN) tablet 500 mg (has no administration in time range)     Initial Impression / Assessment  and Plan / ED Course  I have reviewed the triage  vital signs and the nursing notes.  Pertinent labs & imaging results that were available during my care of the patient were reviewed by me and considered in my medical decision making (see chart for details).     Fall, possible injuries - seems mechanical - was ablet o walk after fall but pt is very old and frail - imaging pending.  No head injury on exam - I personally spoke with the paramedics and the caregiver to obtain additional history.  Pt updated on the xrays - no fractures Pt given robaxin X 1 - pain better Has good f/u and 24 hour inhome care  Final Clinical Impressions(s) / ED Diagnoses   Final diagnoses:  Fall, initial encounter  Contusion of right hip, initial encounter  Rib contusion, right, initial encounter    ED Discharge Orders         Ordered    methocarbamol (ROBAXIN) 500 MG tablet  2 times daily PRN     03/11/18 2220           Noemi Chapel, MD 03/11/18 2226

## 2018-07-10 ENCOUNTER — Observation Stay (HOSPITAL_COMMUNITY)
Admission: EM | Admit: 2018-07-10 | Discharge: 2018-07-15 | Disposition: A | Discharge status: Home / Self Care | Payer: Medicare HMO | Attending: Internal Medicine | Admitting: Internal Medicine

## 2018-07-10 ENCOUNTER — Encounter (HOSPITAL_COMMUNITY): Payer: Self-pay | Admitting: Emergency Medicine

## 2018-07-10 ENCOUNTER — Emergency Department (HOSPITAL_COMMUNITY): Payer: Medicare HMO

## 2018-07-10 DIAGNOSIS — R4781 Slurred speech: Secondary | ICD-10-CM | POA: Diagnosis not present

## 2018-07-10 DIAGNOSIS — G8929 Other chronic pain: Secondary | ICD-10-CM | POA: Diagnosis not present

## 2018-07-10 DIAGNOSIS — R531 Weakness: Secondary | ICD-10-CM | POA: Diagnosis not present

## 2018-07-10 DIAGNOSIS — I110 Hypertensive heart disease with heart failure: Secondary | ICD-10-CM | POA: Diagnosis not present

## 2018-07-10 DIAGNOSIS — Z87891 Personal history of nicotine dependence: Secondary | ICD-10-CM | POA: Diagnosis not present

## 2018-07-10 DIAGNOSIS — G459 Transient cerebral ischemic attack, unspecified: Secondary | ICD-10-CM

## 2018-07-10 DIAGNOSIS — K277 Chronic peptic ulcer, site unspecified, without hemorrhage or perforation: Secondary | ICD-10-CM | POA: Diagnosis not present

## 2018-07-10 DIAGNOSIS — R4182 Altered mental status, unspecified: Secondary | ICD-10-CM | POA: Diagnosis not present

## 2018-07-10 DIAGNOSIS — R410 Disorientation, unspecified: Secondary | ICD-10-CM | POA: Diagnosis not present

## 2018-07-10 DIAGNOSIS — K279 Peptic ulcer, site unspecified, unspecified as acute or chronic, without hemorrhage or perforation: Secondary | ICD-10-CM | POA: Diagnosis present

## 2018-07-10 DIAGNOSIS — I5032 Chronic diastolic (congestive) heart failure: Secondary | ICD-10-CM | POA: Diagnosis present

## 2018-07-10 DIAGNOSIS — Z79899 Other long term (current) drug therapy: Secondary | ICD-10-CM | POA: Diagnosis not present

## 2018-07-10 DIAGNOSIS — R2681 Unsteadiness on feet: Secondary | ICD-10-CM | POA: Diagnosis not present

## 2018-07-10 LAB — RAPID URINE DRUG SCREEN, HOSP PERFORMED
Amphetamines: NOT DETECTED
Barbiturates: NOT DETECTED
Benzodiazepines: NOT DETECTED
Cocaine: NOT DETECTED
Opiates: NOT DETECTED
Tetrahydrocannabinol: NOT DETECTED

## 2018-07-10 LAB — DIFFERENTIAL
ABS IMMATURE GRANULOCYTES: 0.02 10*3/uL (ref 0.00–0.07)
BASOS PCT: 1 %
Basophils Absolute: 0 10*3/uL (ref 0.0–0.1)
EOS ABS: 0.1 10*3/uL (ref 0.0–0.5)
Eosinophils Relative: 2 %
IMMATURE GRANULOCYTES: 0 %
LYMPHS ABS: 1.5 10*3/uL (ref 0.7–4.0)
Lymphocytes Relative: 24 %
Monocytes Absolute: 0.5 10*3/uL (ref 0.1–1.0)
Monocytes Relative: 8 %
NEUTROS ABS: 4.1 10*3/uL (ref 1.7–7.7)
NEUTROS PCT: 65 %

## 2018-07-10 LAB — URINALYSIS, ROUTINE W REFLEX MICROSCOPIC
Bacteria, UA: NONE SEEN
Bilirubin Urine: NEGATIVE
Glucose, UA: NEGATIVE mg/dL
KETONES UR: NEGATIVE mg/dL
Leukocytes,Ua: NEGATIVE
Nitrite: NEGATIVE
Protein, ur: NEGATIVE mg/dL
Specific Gravity, Urine: 1.012 (ref 1.005–1.030)
pH: 7 (ref 5.0–8.0)

## 2018-07-10 LAB — CBC
HCT: 38.6 % (ref 36.0–46.0)
HEMOGLOBIN: 12.3 g/dL (ref 12.0–15.0)
MCH: 33.5 pg (ref 26.0–34.0)
MCHC: 31.9 g/dL (ref 30.0–36.0)
MCV: 105.2 fL — ABNORMAL HIGH (ref 80.0–100.0)
NRBC: 0 % (ref 0.0–0.2)
PLATELETS: 219 10*3/uL (ref 150–400)
RBC: 3.67 MIL/uL — AB (ref 3.87–5.11)
RDW: 13.4 % (ref 11.5–15.5)
WBC: 6.3 10*3/uL (ref 4.0–10.5)

## 2018-07-10 LAB — I-STAT CREATININE, ED: CREATININE: 0.6 mg/dL (ref 0.44–1.00)

## 2018-07-10 LAB — PROTIME-INR
INR: 1 (ref 0.8–1.2)
PROTHROMBIN TIME: 13.1 s (ref 11.4–15.2)

## 2018-07-10 LAB — CBG MONITORING, ED: GLUCOSE-CAPILLARY: 95 mg/dL (ref 70–99)

## 2018-07-10 LAB — COMPREHENSIVE METABOLIC PANEL
ALT: 14 U/L (ref 0–44)
AST: 25 U/L (ref 15–41)
Albumin: 4.4 g/dL (ref 3.5–5.0)
Alkaline Phosphatase: 57 U/L (ref 38–126)
Anion gap: 8 (ref 5–15)
BUN: 17 mg/dL (ref 8–23)
CALCIUM: 9 mg/dL (ref 8.9–10.3)
CO2: 28 mmol/L (ref 22–32)
Chloride: 106 mmol/L (ref 98–111)
Creatinine, Ser: 0.67 mg/dL (ref 0.44–1.00)
GFR calc Af Amer: >60 mL/min (ref >60)
GFR calc non Af Amer: >60 mL/min (ref >60)
Glucose, Bld: 108 mg/dL — ABNORMAL HIGH (ref 70–99)
Potassium: 3.8 mmol/L (ref 3.5–5.1)
Sodium: 142 mmol/L (ref 135–145)
Total Bilirubin: 0.8 mg/dL (ref 0.3–1.2)
Total Protein: 7 g/dL (ref 6.5–8.1)

## 2018-07-10 LAB — ETHANOL

## 2018-07-10 LAB — APTT: APTT: 34 s (ref 24–36)

## 2018-07-10 MED ORDER — SODIUM CHLORIDE 0.9 % IV BOLUS
500.0000 mL | Freq: Once | INTRAVENOUS | Status: AC
  Administered 2018-07-10: 500 mL via INTRAVENOUS

## 2018-07-10 MED ORDER — SENNOSIDES-DOCUSATE SODIUM 8.6-50 MG PO TABS: 1.0000 | ORAL_TABLET | Freq: Every evening | ORAL | Status: DC | PRN

## 2018-07-10 MED ORDER — DOCUSATE SODIUM 100 MG PO CAPS
100.0000 mg | ORAL_CAPSULE | Freq: Every evening | ORAL | Status: DC
  Administered 2018-07-10 – 2018-07-13 (×4): 100 mg via ORAL
  Filled 2018-07-10 (×5): qty 1

## 2018-07-10 MED ORDER — OXYCODONE HCL 5 MG PO TABS
5.0000 mg | ORAL_TABLET | Freq: Four times a day (QID) | ORAL | Status: DC | PRN
  Administered 2018-07-11: 5 mg via ORAL
  Filled 2018-07-10: qty 1

## 2018-07-10 MED ORDER — STROKE: EARLY STAGES OF RECOVERY BOOK
Freq: Once | Status: AC
  Administered 2018-07-10: 22:00:00
  Filled 2018-07-10: qty 1

## 2018-07-10 MED ORDER — ACETAMINOPHEN 160 MG/5ML PO SOLN: 650.0000 mg | ORAL | Status: DC | PRN

## 2018-07-10 MED ORDER — OXYCODONE-ACETAMINOPHEN 10-325 MG PO TABS: 1.0000 | ORAL_TABLET | Freq: Four times a day (QID) | ORAL | Status: DC | PRN

## 2018-07-10 MED ORDER — HALOPERIDOL LACTATE 5 MG/ML IJ SOLN
2.0000 mg | Freq: Once | INTRAMUSCULAR | Status: AC
  Administered 2018-07-10: 2 mg via INTRAVENOUS
  Filled 2018-07-10: qty 1

## 2018-07-10 MED ORDER — OXYCODONE-ACETAMINOPHEN 5-325 MG PO TABS: 1.0000 | ORAL_TABLET | Freq: Four times a day (QID) | ORAL | Status: DC | PRN

## 2018-07-10 MED ORDER — ALBUTEROL SULFATE (2.5 MG/3ML) 0.083% IN NEBU: 3.0000 mL | INHALATION_SOLUTION | Freq: Four times a day (QID) | RESPIRATORY_TRACT | Status: DC | PRN

## 2018-07-10 MED ORDER — ENOXAPARIN SODIUM 30 MG/0.3ML ~~LOC~~ SOLN
30.0000 mg | SUBCUTANEOUS | Status: DC
  Administered 2018-07-10 – 2018-07-14 (×5): 30 mg via SUBCUTANEOUS
  Filled 2018-07-10 (×6): qty 0.3

## 2018-07-10 MED ORDER — ACETAMINOPHEN 650 MG RE SUPP: 650.0000 mg | RECTAL | Status: DC | PRN

## 2018-07-10 MED ORDER — DICLOFENAC SODIUM 1 % TD GEL
2.0000 g | Freq: Four times a day (QID) | TRANSDERMAL | Status: DC
  Administered 2018-07-10 – 2018-07-15 (×12): 2 g via TOPICAL
  Filled 2018-07-10: qty 100

## 2018-07-10 MED ORDER — POLYETHYLENE GLYCOL 3350 17 G PO PACK
17.0000 g | PACK | Freq: Every day | ORAL | Status: DC
  Administered 2018-07-10 – 2018-07-15 (×3): 17 g via ORAL
  Filled 2018-07-10 (×5): qty 1

## 2018-07-10 MED ORDER — ALPRAZOLAM 0.25 MG PO TABS
0.2500 mg | ORAL_TABLET | Freq: Every day | ORAL | Status: DC | PRN
  Administered 2018-07-11 – 2018-07-14 (×2): 0.25 mg via ORAL
  Filled 2018-07-10 (×3): qty 1

## 2018-07-10 MED ORDER — SODIUM CHLORIDE 0.9 % IV SOLN
INTRAVENOUS | Status: DC
  Administered 2018-07-10: 75 mL/h via INTRAVENOUS
  Administered 2018-07-10: 21:00:00 via INTRAVENOUS

## 2018-07-10 MED ORDER — PANTOPRAZOLE SODIUM 40 MG PO TBEC
40.0000 mg | DELAYED_RELEASE_TABLET | Freq: Every morning | ORAL | Status: DC
  Administered 2018-07-11 – 2018-07-15 (×4): 40 mg via ORAL
  Filled 2018-07-10 (×5): qty 1

## 2018-07-10 MED ORDER — ACETAMINOPHEN 325 MG PO TABS
650.0000 mg | ORAL_TABLET | ORAL | Status: DC | PRN
  Administered 2018-07-12: 650 mg via ORAL
  Filled 2018-07-10 (×2): qty 2

## 2018-07-10 NOTE — H&P (Signed)
History and Physical  KIMLA FURTH QPY:[id number] DOB: 08-07-1918 DOA: 07/10/2018  Referring physician: Dr Rogene Houston, ED physician PCP: Celene Squibb, MD  Outpatient Specialists:   Patient Coming From: home  Chief Complaint: confusion, right leg weakness  HPI: MARZELLE RUTTEN is a 85 y.o. female with a history of diastolic heart failure, uterine cancer status post hysterectomy, arthritis, chronic pain management.  Patient seen for confusion and slurred speech that started yesterday evening.  Patient lives at home alone and is cared for by a caregiver.  The patient had received her evening dose of oxycodone, then had dinner.  Her caregiver was away for approximately an hour to an hour and a half in the patient.  Fairly confused, disoriented, had slurred speech when the caregiver returned.  After approximately an hour or so, the patient went to bed and when she awoke, her symptoms were improved, however she still displayed some disorientation including not knowing her caregivers name.  She was brought to the emergency room for evaluation.  Emergency Department Course: CT of the head shows chronic ischemic and atrophic changes.  No acute findings.  White count normal.  Creatinine normal.  Moderate amount of hemoglobin and urinalysis, but without nitrites, leukocyte esterase.  Urine tox screen negative  Review of Systems:   Pt complains of frequency this morning.  Pt denies any fevers, chills, nausea, vomiting, diarrhea, constipation, abdominal pain, shortness of breath, dyspnea on exertion, orthopnea, cough, wheezing, palpitations, headache, vision changes, lightheadedness, dizziness, melena, rectal bleeding.  Review of systems are otherwise negative  Past Medical History:  Diagnosis Date  . Anemia   . Arthritis   . Cancer ([place])    uterine surg only  . CHF (congestive heart failure) ([place])   . Colitis 12/31/2012   presumed C.Diff. patient declined colonoscopy  . Hypotension   .  Kidney stone   . Muscle weakness (generalized)   . Osteoarthritis   . Pancreatic atrophy 12/31/2012  . Pneumonia    Past Surgical History:  Procedure Laterality Date  . ABDOMINAL HYSTERECTOMY    . CHOLECYSTECTOMY    . ESOPHAGOGASTRODUODENOSCOPY N/A 03/03/2013   RMR: Significant gastric ulcer without bleeding stigmata requiring no endoscopic intervention today. Noncritical Schatzki's ring. Small hiatal hernia  . ESOPHAGOGASTRODUODENOSCOPY N/A 08/24/2013   Dr. [name]: Hiatal hernia.  Gastric mucosal scar formation at site of previously noted gastric ulcerations  . JOINT REPLACEMENT    . LEFT KNEE SURGERY AND REVISION    . RIGHT HIP SURGERY     Social History:  reports that she quit smoking about 67 years ago. Her smoking use included cigarettes. She has never used smokeless tobacco. She reports that she does not drink alcohol or use drugs. Patient lives at home  Allergies  Allergen Reactions  . Sulfa Antibiotics Other (See Comments)    Reaction:  Unknown   . Aspirin Other (See Comments)    Reaction:  Burning of pts stomach     Family History  Problem Relation Age of Onset  . Cancer Mother       Prior to Admission medications   Medication Sig Start Date End Date Taking? Authorizing Provider  acetaminophen (TYLENOL) 500 MG tablet Take 500-1,000 mg by mouth every 6 (six) hours as needed for mild pain, moderate pain, fever or headache.    Yes [provider]  albuterol (PROAIR HFA) 108 (90 BASE) MCG/ACT inhaler Inhale 1-2 puffs into the lungs every 6 (six) hours as needed for wheezing or shortness of breath.  Yes [provider]  ALPRAZolam (XANAX) 0.25 MG tablet Take 0.25 mg by mouth daily as needed for anxiety.  06/10/16  Yes [provider]  alum & mag hydroxide-simeth (MAALOX REGULAR STRENGTH) 408-144-81 MG/5ML suspension Take 15 mLs by mouth every 6 (six) hours as needed for indigestion or heartburn. 08/16/16  Yes Kinnie Feil, PA-C  diclofenac  sodium (VOLTAREN) 1 % GEL Apply 2 g topically 4 (four) times daily.  08/18/17  Yes [provider]  docusate sodium (COLACE) 100 MG capsule Take 100 mg by mouth every evening.    Yes [provider]  oxyCODONE-acetaminophen (PERCOCET) 10-325 MG tablet Take 1 tablet by mouth every 6 (six) hours as needed for pain.  11/24/17  Yes [provider]  pantoprazole (PROTONIX) 40 MG tablet Take 40 mg by mouth every morning.    Yes [provider]  polyethylene glycol (MIRALAX / GLYCOLAX) packet Take 17 g by mouth daily.    Yes [provider]    Physical Exam: BP 134/73   Pulse 61   Temp 98.9 F (37.2 C) (Oral)   Resp (!) 21   Ht 5' (1.524 m)   Wt 32.7 kg   SpO2 97%   BMI 14.08 kg/m   . General: Elderly Caucasian female. Awake and alert and oriented x3. No acute cardiopulmonary distress.  Marland Kitchen HEENT: Normocephalic atraumatic.  Right and left ears normal in appearance.  Pupils equal, round, reactive to light. Extraocular muscles are intact. Sclerae anicteric and noninjected.  Moist mucosal membranes. No mucosal lesions.  . Neck: Neck supple without lymphadenopathy. No carotid bruits. No masses palpated.  . Cardiovascular: Regular rate with normal S1-S2 sounds. No murmurs, rubs, gallops auscultated. No JVD.  Marland Kitchen Respiratory: Good respiratory effort with no wheezes, rales, rhonchi. Lungs clear to auscultation bilaterally.  No accessory muscle use. . Abdomen: Soft, nontender, nondistended. Active bowel sounds. No masses or hepatosplenomegaly  . Skin: No rashes, lesions, or ulcerations.  Dry, warm to touch. 2+ dorsalis pedis and radial pulses. . Musculoskeletal: No calf or leg pain. All major joints not erythematous nontender.  No upper or lower joint deformation.  Good ROM.  No contractures  . Psychiatric: Intact judgment and insight. Pleasant and cooperative. . Neurologic: Strength is 5/5 and symmetric in upper and lower extremities except for right hip flexors,  which are 3 out of 5.  Cranial nerves II through XII are grossly intact.  Coordination intact.           Labs on Admission: I have personally reviewed following labs and imaging studies  CBC: Recent Labs  Lab 07/10/18 1422  WBC 6.3  NEUTROABS 4.1  HGB 12.3  HCT 38.6  MCV 105.2*  PLT 856   Basic Metabolic Panel: Recent Labs  Lab 07/10/18 1422 07/10/18 1440  NA 142  --   K 3.8  --   CL 106  --   CO2 28  --   GLUCOSE 108*  --   BUN 17  --   CREATININE 0.67 0.60  CALCIUM 9.0  --    GFR: Estimated Creatinine Clearance: 20.7 mL/min (by C-G formula based on SCr of 0.6 mg/dL). Liver Function Tests: Recent Labs  Lab 07/10/18 1422  AST 25  ALT 14  ALKPHOS 57  BILITOT 0.8  PROT 7.0  ALBUMIN 4.4   No results for input(s): LIPASE, AMYLASE in the last 168 hours. No results for input(s): AMMONIA in the last 168 hours. Coagulation Profile: Recent Labs  Lab 07/10/18  1422  INR 1.0   Cardiac Enzymes: No results for input(s): CKTOTAL, CKMB, CKMBINDEX, TROPONINI in the last 168 hours. BNP (last 3 results) No results for input(s): PROBNP in the last 8760 hours. HbA1C: No results for input(s): HGBA1C in the last 72 hours. CBG: Recent Labs  Lab 07/10/18 1353  GLUCAP 95   Lipid Profile: No results for input(s): CHOL, HDL, LDLCALC, TRIG, CHOLHDL, LDLDIRECT in the last 72 hours. Thyroid Function Tests: No results for input(s): TSH, T4TOTAL, FREET4, T3FREE, THYROIDAB in the last 72 hours. Anemia Panel: No results for input(s): VITAMINB12, FOLATE, FERRITIN, TIBC, IRON, RETICCTPCT in the last 72 hours. Urine analysis:    Component Value Date/Time   COLORURINE YELLOW 07/10/2018 1358   APPEARANCEUR CLEAR 07/10/2018 1358   LABSPEC 1.012 07/10/2018 1358   PHURINE 7.0 07/10/2018 1358   GLUCOSEU NEGATIVE 07/10/2018 1358   HGBUR MODERATE (A) 07/10/2018 1358   BILIRUBINUR NEGATIVE 07/10/2018 1358   KETONESUR NEGATIVE 07/10/2018 1358   PROTEINUR NEGATIVE 07/10/2018 1358    UROBILINOGEN 0.2 03/13/2015 0120   NITRITE NEGATIVE 07/10/2018 1358   LEUKOCYTESUR NEGATIVE 07/10/2018 1358   Sepsis Labs: @LABRCNTIP (procalcitonin:4,lacticidven:4) )No results found for this or any previous visit (from the past 240 hour(s)).   Radiological Exams on Admission: Dg Chest 2 View  Result Date: 07/10/2018 CLINICAL DATA:  Altered mental status EXAM: CHEST - 2 VIEW COMPARISON:  03/11/2018 FINDINGS: Cardiac shadow is stable. Stable scoliosis concave to the left is noted. No focal infiltrate or effusion is seen. No acute bony abnormality is noted. IMPRESSION: No acute abnormality noted. Electronically Signed   By: Inez Catalina M.D.   On: 07/10/2018 15:10   Ct Head Wo Contrast  Result Date: 07/10/2018 CLINICAL DATA:  Slurred speech EXAM: CT HEAD WITHOUT CONTRAST TECHNIQUE: Contiguous axial images were obtained from the base of the skull through the vertex without intravenous contrast. COMPARISON:  02/26/2017 FINDINGS: Brain: Chronic atrophic changes are identified. Chronic white matter ischemic changes are again noted and stable. No findings to suggest acute hemorrhage, acute infarction or space-occupying mass lesion are seen. Basal ganglia calcifications are noted on the left. Vascular: No hyperdense vessel or unexpected calcification. Skull: Normal. Negative for fracture or focal lesion. Sinuses/Orbits: No acute finding. Other: None. IMPRESSION: Chronic atrophic and ischemic changes without acute abnormality. Electronically Signed   By: Inez Catalina M.D.   On: 07/10/2018 15:14    EKG: Independently reviewed.  Sinus rhythm with first-degree AV block.  Left anterior fascicular block.  Assessment/Plan: Principal Problem:   Confusion Active Problems:   PUD (peptic ulcer disease)   Chronic diastolic CHF (congestive heart failure) ([facility])   Chronic pain    This patient was discussed with the ED physician, including pertinent vitals, physical exam findings, labs, and imaging.  We also  discussed care given by the ED provider.  1. Confusion a. Question TIA b. Currently improved Observation on telemetry MRI/MRA head Echocardiogram tomorrow Hemoglobin A1c, lipid panel in the morning PT/OT/speech therapy consult Full aspirin 2. Chronic diastolic heart failure a. Compensated 3. Chronic pain a. Continue chronic pain medicine 4. Peptic ulcers disease  DVT prophylaxis: lovenox Consultants: none Code Status: full Family Communication:   Disposition Plan: home following eval   Truett Mainland, DO

## 2018-07-10 NOTE — ED Notes (Signed)
Report given to RN on 3W  

## 2018-07-10 NOTE — ED Provider Notes (Signed)
[facility] EMERGENCY DEPARTMENT Provider Note   CSN: [id number] Arrival date & time: 07/10/18  1348    History   Chief Complaint Chief Complaint  Patient presents with  . Weakness    HPI [name] is a 85 y.o. female.     Patient brought in by EMS.  Patient last seen normal at 9 PM last evening.  Patient does have a Actuary.  The sitter saw her at 9 PM normal.  Went away for about an hour and a half and came back at 1030 patient had slurred speech and had some memory confusion.  But symptoms had not completely resolved.  Everything seems to be back to normal now according to speech and memory.  They also noted that she had some weakness with her right arm for drinking her tea.  Patient has had some weakness to the right leg now for several weeks.  Patient is a full code.  Patient lives by herself does have a Technical brewer but sometimes the sitters not there overnight.  Patient is known to have arthritis is a history of congestive heart failure.  Patient normally walks with a walker or scooter.  Patient is hard of hearing.     Past Medical History:  Diagnosis Date  . Anemia   . Arthritis   . Cancer ([place])    uterine surg only  . CHF (congestive heart failure) ([place])   . Colitis 12/31/2012   presumed C.Diff. patient declined colonoscopy  . Hypotension   . Kidney stone   . Muscle weakness (generalized)   . Osteoarthritis   . Pancreatic atrophy 12/31/2012  . Pneumonia     Patient Active Problem List   Diagnosis Date Noted  . Confusion 07/10/2018  . Closed fracture of distal end of right humerus with routine healing 01/04/2018  . Multiple rib fractures 05/09/2016  . Fall 05/09/2016  . Leukocytosis 05/09/2016  . Chest pain 03/13/2015  . Other specified fever   . Pain in the chest   . Abdominal pain 10/28/2014  . Anemia 10/28/2014  . Hypokalemia 10/28/2014  . Protein-calorie malnutrition, severe ([place]) 10/28/2014  . Adynamic ileus ([place]) 10/28/2014  . Dyspnea  09/18/2014  . Nausea and vomiting 09/18/2014  . Cellulitis 09/02/2014  . Community acquired pneumonia 08/14/2014  . Acute respiratory failure with hypoxia ([place]) 08/13/2014  . Macrocytic anemia 08/13/2014  . Chronic diastolic CHF (congestive heart failure) ([place]) 08/13/2014  . CAP (community acquired pneumonia) 08/12/2014  . PUD (peptic ulcer disease) 10/05/2013  . Acute gastric ulcer due to Helicobacter pylori 44/81/8563  . Right kidney stone 12/31/2012  . Hypotension 12/31/2012  . Pancreatic atrophy 12/31/2012    Past Surgical History:  Procedure Laterality Date  . ABDOMINAL HYSTERECTOMY    . CHOLECYSTECTOMY    . ESOPHAGOGASTRODUODENOSCOPY N/A 03/03/2013   RMR: Significant gastric ulcer without bleeding stigmata requiring no endoscopic intervention today. Noncritical Schatzki's ring. Small hiatal hernia  . ESOPHAGOGASTRODUODENOSCOPY N/A 08/24/2013   Dr. [name]: Hiatal hernia.  Gastric mucosal scar formation at site of previously noted gastric ulcerations  . JOINT REPLACEMENT    . LEFT KNEE SURGERY AND REVISION    . RIGHT HIP SURGERY       OB History    Gravida  3   Para  2   Term  2   Preterm      AB  1   Living        SAB  1   TAB  Ectopic      Multiple      Live Births               Home Medications    Prior to Admission medications   Medication Sig Start Date End Date Taking? Authorizing Provider  acetaminophen (TYLENOL) 500 MG tablet Take 500-1,000 mg by mouth every 6 (six) hours as needed for mild pain, moderate pain, fever or headache.    Yes [provider]  albuterol (PROAIR HFA) 108 (90 BASE) MCG/ACT inhaler Inhale 1-2 puffs into the lungs every 6 (six) hours as needed for wheezing or shortness of breath.   Yes [provider]  ALPRAZolam (XANAX) 0.25 MG tablet Take 0.25 mg by mouth daily as needed for anxiety.  06/10/16  Yes [provider]  alum & mag hydroxide-simeth (MAALOX REGULAR STRENGTH) 253-664-40 MG/5ML  suspension Take 15 mLs by mouth every 6 (six) hours as needed for indigestion or heartburn. 08/16/16  Yes Kinnie Feil, PA-C  diclofenac sodium (VOLTAREN) 1 % GEL Apply 2 g topically 4 (four) times daily.  08/18/17  Yes [provider]  docusate sodium (COLACE) 100 MG capsule Take 100 mg by mouth every evening.    Yes [provider]  oxyCODONE-acetaminophen (PERCOCET) 10-325 MG tablet Take 1 tablet by mouth every 6 (six) hours as needed for pain.  11/24/17  Yes [provider]  pantoprazole (PROTONIX) 40 MG tablet Take 40 mg by mouth every morning.    Yes [provider]  polyethylene glycol (MIRALAX / GLYCOLAX) packet Take 17 g by mouth daily.    Yes [provider]    Family History Family History  Problem Relation Age of Onset  . Cancer Mother     Social History Social History   Tobacco Use  . Smoking status: Former Smoker    Types: Cigarettes    Last attempt to quit: 04/25/1951    Years since quitting: 67.2  . Smokeless tobacco: Never Used  Substance Use Topics  . Alcohol use: No    Alcohol/week: 1.0 standard drinks    Types: 1 Glasses of wine per week  . Drug use: No     Allergies   Sulfa antibiotics and Aspirin   Review of Systems Review of Systems  Constitutional: Negative for chills and fever.  HENT: Negative for rhinorrhea and sore throat.   Eyes: Negative for visual disturbance.  Respiratory: Negative for cough and shortness of breath.   Cardiovascular: Negative for chest pain and leg swelling.  Gastrointestinal: Negative for abdominal pain, diarrhea, nausea and vomiting.  Genitourinary: Negative for dysuria.  Musculoskeletal: Positive for arthralgias. Negative for back pain and neck pain.  Skin: Negative for rash.  Neurological: Positive for speech difficulty and weakness. Negative for dizziness, light-headedness and headaches.  Hematological: Does not bruise/bleed easily.  Psychiatric/Behavioral: Negative for  confusion.     Physical Exam Updated Vital Signs BP (!) 141/110   Pulse 63   Temp 98.9 F (37.2 C) (Oral)   Resp 13   Ht 1.524 m (5')   Wt 32.7 kg   SpO2 97%   BMI 14.08 kg/m   Physical Exam Vitals signs and nursing note reviewed.  Constitutional:      General: She is not in acute distress.    Appearance: Normal appearance. She is well-developed.  HENT:     Head: Normocephalic and atraumatic.     Nose: No congestion.     Mouth/Throat:     Mouth: Mucous membranes are  moist.  Eyes:     Extraocular Movements: Extraocular movements intact.     Conjunctiva/sclera: Conjunctivae normal.     Pupils: Pupils are equal, round, and reactive to light.  Neck:     Musculoskeletal: Neck supple.  Cardiovascular:     Rate and Rhythm: Normal rate and regular rhythm.     Heart sounds: No murmur.  Pulmonary:     Effort: Pulmonary effort is normal. No respiratory distress.     Breath sounds: Normal breath sounds.  Abdominal:     Palpations: Abdomen is soft.     Tenderness: There is no abdominal tenderness.  Musculoskeletal: Normal range of motion.        General: No swelling.     Comments: Dorsalis pedis pulse to both feet is 1+.  Skin:    General: Skin is warm and dry.     Capillary Refill: Capillary refill takes less than 2 seconds.  Neurological:     Mental Status: She is alert.     Cranial Nerves: No cranial nerve deficit.     Motor: Weakness present.     Comments: Patient has weakness to the right lower extremity.  Difficulty picking it off the bed can do it shortly for about 1 inch above the bed.  Does have some range of motion issues with the right upper extremity but that is secondary to shoulder injury and arthritis.  That is seems to be back to baseline.  Left upper extremity with good strength left leg with good strength.      ED Treatments / Results  Labs (all labs ordered are listed, but only abnormal results are displayed) Labs Reviewed  CBC - Abnormal; Notable  for the following components:      Result Value   RBC 3.67 (*)    MCV 105.2 (*)    All other components within normal limits  COMPREHENSIVE METABOLIC PANEL - Abnormal; Notable for the following components:   Glucose, Bld 108 (*)    All other components within normal limits  URINALYSIS, ROUTINE W REFLEX MICROSCOPIC - Abnormal; Notable for the following components:   Hgb urine dipstick MODERATE (*)    All other components within normal limits  URINE CULTURE  ETHANOL  PROTIME-INR  APTT  DIFFERENTIAL  RAPID URINE DRUG SCREEN, HOSP PERFORMED  CBG MONITORING, ED  I-STAT CREATININE, ED    EKG EKG Interpretation  Date/Time:  Saturday July 10 2018 13:56:08 EST Ventricular Rate:  68 PR Interval:  224 QRS Duration: 78 QT Interval:  394 QTC Calculation: 418 R Axis:   -45 Text Interpretation:  Sinus rhythm with 1st degree A-V block Left anterior fascicular block Abnormal ECG No significant change since last tracing Confirmed by Fredia Sorrow [phone]) on 07/10/2018 2:17:29 PM   Radiology Dg Chest 2 View  Result Date: 07/10/2018 CLINICAL DATA:  Altered mental status EXAM: CHEST - 2 VIEW COMPARISON:  03/11/2018 FINDINGS: Cardiac shadow is stable. Stable scoliosis concave to the left is noted. No focal infiltrate or effusion is seen. No acute bony abnormality is noted. IMPRESSION: No acute abnormality noted. Electronically Signed   By: Inez Catalina M.D.   On: 07/10/2018 15:10   Ct Head Wo Contrast  Result Date: 07/10/2018 CLINICAL DATA:  Slurred speech EXAM: CT HEAD WITHOUT CONTRAST TECHNIQUE: Contiguous axial images were obtained from the base of the skull through the vertex without intravenous contrast. COMPARISON:  02/26/2017 FINDINGS: Brain: Chronic atrophic changes are identified. Chronic white matter ischemic changes are again noted and stable.  No findings to suggest acute hemorrhage, acute infarction or space-occupying mass lesion are seen. Basal ganglia calcifications are noted  on the left. Vascular: No hyperdense vessel or unexpected calcification. Skull: Normal. Negative for fracture or focal lesion. Sinuses/Orbits: No acute finding. Other: None. IMPRESSION: Chronic atrophic and ischemic changes without acute abnormality. Electronically Signed   By: Inez Catalina M.D.   On: 07/10/2018 15:14    Procedures Procedures (including critical care time)  CRITICAL CARE Performed by: Fredia Sorrow Total critical care time: 30 minutes Critical care time was exclusive of separately billable procedures and treating other patients. Critical care was necessary to treat or prevent imminent or life-threatening deterioration. Critical care was time spent personally by me on the following activities: development of treatment plan with patient and/or surrogate as well as nursing, discussions with consultants, evaluation of patient's response to treatment, examination of patient, obtaining history from patient or surrogate, ordering and performing treatments and interventions, ordering and review of laboratory studies, ordering and review of radiographic studies, pulse oximetry and re-evaluation of patient's condition.   Medications Ordered in ED Medications  0.9 %  sodium chloride infusion (75 mL/hr Intravenous New Bag/Given 07/10/18 1419)  sodium chloride 0.9 % bolus 500 mL (500 mLs Intravenous New Bag/Given 07/10/18 1419)     Initial Impression / Assessment and Plan / ED Course  I have reviewed the triage vital signs and the nursing notes.  Pertinent labs & imaging results that were available during my care of the patient were reviewed by me and considered in my medical decision making (see chart for details).        Discussed with hospitalist.  Does some concerns that there may have been TIA.  Patient still has right leg weakness but that apparently has been present for several days.  Head CT without any acute findings.  MRI not available.  Patient will be transferred to [facility]  for hospitalist admission for possible TIA.  Patient does take pain medicine on a regular basis.  Patient has never had slurred speech and confusion or difficulty with memory as what occurred last evening.  Patient last seen normal was at 2100.  Patient symptoms have now all resolved other than some right leg weakness.  Patient not a candidate for TPA.  Patient not a candidate for intervention based on her presentation.  At this time. Final Clinical Impressions(s) / ED Diagnoses   Final diagnoses:  TIA (transient ischemic attack)    ED Discharge Orders    None       Fredia Sorrow, MD 07/10/18 1649

## 2018-07-10 NOTE — ED Triage Notes (Signed)
Pt brought in by ems after family called for slurred speech.  States it started sometime yesterday and the pt seemed more confused than usual.  Pt is alert, oriented, but unable to follow all commands on arrival to ED.

## 2018-07-10 NOTE — Progress Notes (Signed)
Received pt from [facility] via [facility], alert and oriented, placed on Tele, no family at bedside, continue to monitor

## 2018-07-10 NOTE — ED Notes (Signed)
Report given to [facility]. 

## 2018-07-11 ENCOUNTER — Other Ambulatory Visit: Payer: Self-pay

## 2018-07-11 ENCOUNTER — Observation Stay (HOSPITAL_COMMUNITY): Payer: Medicare HMO

## 2018-07-11 ENCOUNTER — Observation Stay (HOSPITAL_BASED_OUTPATIENT_CLINIC_OR_DEPARTMENT_OTHER): Payer: Medicare HMO

## 2018-07-11 DIAGNOSIS — I340 Nonrheumatic mitral (valve) insufficiency: Secondary | ICD-10-CM | POA: Diagnosis not present

## 2018-07-11 DIAGNOSIS — R410 Disorientation, unspecified: Secondary | ICD-10-CM | POA: Diagnosis not present

## 2018-07-11 LAB — HEMOGLOBIN A1C
Hgb A1c MFr Bld: 5.5 % (ref 4.8–5.6)
MEAN PLASMA GLUCOSE: 111.15 mg/dL

## 2018-07-11 LAB — LIPID PANEL
Cholesterol: 178 mg/dL (ref 0–200)
HDL: 67 mg/dL (ref >40)
LDL Cholesterol: 98 mg/dL (ref 0–99)
Total CHOL/HDL Ratio: 2.7 RATIO
Triglycerides: 65 mg/dL (ref <150)
VLDL: 13 mg/dL (ref 0–40)

## 2018-07-11 LAB — TSH: TSH: 0.831 u[IU]/mL (ref 0.350–4.500)

## 2018-07-11 LAB — VITAMIN B12: Vitamin B-12: 204 pg/mL (ref 180–914)

## 2018-07-11 MED ORDER — CYANOCOBALAMIN 1000 MCG/ML IJ SOLN
1000.0000 ug | Freq: Once | INTRAMUSCULAR | Status: AC
  Administered 2018-07-11: 1000 ug via INTRAMUSCULAR
  Filled 2018-07-11: qty 1

## 2018-07-11 NOTE — Evaluation (Signed)
Occupational Therapy Evaluation Patient Details Name: [name] MRN: [id number] DOB: 06-23-1918 Today's Date: 07/11/2018    History of Present Illness 85 y.o. female with a history of diastolic heart failure, uterine cancer status post hysterectomy, arthritis, chronic pain management.  Patient seen for confusion and slurred speech. CT of the head shows chronic ischemic and atrophic changes, no acute findings. MRI pending.   Clinical Impression   Pt admitted with the above diagnoses and presents with below problem list. Pt will benefit from continued acute OT to address the below listed deficits and maximize independence with basic ADLs prior to d/c to venue below. Per chart review pt is from home alone, does have some caregiver assistance. No family/caregiver present on eval to clarify PLOF and home setup. Pt presents with confusion and expressive difficulties as well as balance deficits impacting ADLs. Pt is currently mod-max A with UB/LB ADLs, min A to ambulate short distance in the room.     Follow Up Recommendations  SNF    Equipment Recommendations  Other (comment)(defer to next venue)    Recommendations for Other Services PT consult;Speech consult     Precautions / Restrictions Precautions Precautions: Fall Restrictions Weight Bearing Restrictions: No      Mobility Bed Mobility Overal bed mobility: Needs Assistance Bed Mobility: Sit to Supine       Sit to supine: Mod assist   General bed mobility comments: asssit to advance BLE up on to bed  Transfers Overall transfer level: Needs assistance Equipment used: Rolling walker (2 wheeled) Transfers: Sit to/from Stand Sit to Stand: Min assist         General transfer comment: to/from EOB and BSC. steadying assist    Balance Overall balance assessment: Needs assistance Sitting-balance support: Bilateral upper extremity supported;No upper extremity supported;Feet supported Sitting balance-Leahy Scale: Fair      Standing balance support: Bilateral upper extremity supported;During functional activity Standing balance-Leahy Scale: Poor Standing balance comment: rw for support in static stand                           ADL either performed or assessed with clinical judgement   ADL Overall ADL's : Needs assistance/impaired Eating/Feeding: Set up;Sitting   Grooming: Maximal assistance;Sitting;Total assistance   Upper Body Bathing: Total assistance;Sitting   Lower Body Bathing: Maximal assistance;Sit to/from stand   Upper Body Dressing : Maximal assistance;Total assistance;Sitting   Lower Body Dressing: Maximal assistance;Sit to/from stand   Toilet Transfer: Minimal assistance;Stand-pivot;BSC   Toileting- Clothing Manipulation and Hygiene: Maximal assistance;Sit to/from stand   Tub/ Shower Transfer: Minimal assistance;Walk-in shower;Stand-pivot;3 in 1;Rolling walker   Functional mobility during ADLs: Minimal assistance;Rolling walker General ADL Comments: Pt sat EOB several minutes at min guard level. Walked short distance in the room with min A. SPT EOB<>BSC.      Vision Baseline Vision/History: Wears glasses Patient Visual Report: No change from baseline       Perception     Praxis      Pertinent Vitals/Pain Pain Assessment: Faces Faces Pain Scale: Hurts a little bit Pain Location: unspecified Pain Intervention(s): Monitored during session     Hand Dominance     Extremity/Trunk Assessment Upper Extremity Assessment Upper Extremity Assessment: Generalized weakness;RUE deficits/detail;LUE deficits/detail RUE Deficits / Details: severe OA throughout UEs. able to grip walker handles.  RUE Coordination: decreased fine motor LUE Deficits / Details: severe OA throughout UEs. able to grip walker handles.  LUE Coordination: decreased fine motor  Lower Extremity Assessment Lower Extremity Assessment: Defer to PT evaluation   Cervical / Trunk Assessment Cervical /  Trunk Assessment: Kyphotic;Other exceptions Cervical / Trunk Exceptions: scoliosis   Communication Communication Communication: Expressive difficulties;HOH   Cognition Arousal/Alertness: Awake/alert Behavior During Therapy: Flat affect Overall Cognitive Status: No family/caregiver present to determine baseline cognitive functioning                                 General Comments: Oriented to person and place "[place], hospital." verbal perseveration noted. STM deficits. slow processing. Word finding difficulties which pt did have some awareness of "I can't seem to get my words out right."   General Comments  Pt on the phone with caregiver who called in at end of session.     Exercises     Shoulder Instructions      Home Living Family/patient expects to be discharged to:: Skilled nursing facility   Available Help at Discharge: Personal care attendant                             Additional Comments:       Prior Functioning/Environment Level of Independence: Needs assistance  Gait / Transfers Assistance Needed: pt reports ambulating with rw ADL's / Homemaking Assistance Needed: suspect she was needing some assistance with bathing/dressing PTA            OT Problem List: Decreased activity tolerance;Decreased strength;Decreased range of motion;Impaired balance (sitting and/or standing);Decreased coordination;Decreased cognition;Decreased knowledge of use of DME or AE;Decreased knowledge of precautions;Impaired UE functional use;Pain      OT Treatment/Interventions: Self-care/ADL training;DME and/or AE instruction;Therapeutic activities;Cognitive remediation/compensation;Patient/family education;Balance training    OT Goals(Current goals can be found in the care plan section) Acute Rehab OT Goals Patient Stated Goal: did not state OT Goal Formulation: With patient Time For Goal Achievement: 07/25/18 Potential to Achieve Goals: Good ADL  Goals Pt Will Perform Upper Body Dressing: with min assist;sitting Pt Will Perform Lower Body Dressing: with min assist;sit to/from stand Pt Will Transfer to Toilet: with min guard assist;ambulating Pt Will Perform Toileting - Clothing Manipulation and hygiene: sit to/from stand;with min assist Pt Will Perform Tub/Shower Transfer: Shower transfer;ambulating;with min assist;3 in 1;rolling walker Additional ADL Goal #1: Pt will complete bed mobility at min guard level to prepare for OOB ADLs.  OT Frequency: Min 2X/week   Barriers to D/C:            Co-evaluation              AM-PAC OT "6 Clicks" Daily Activity     Outcome Measure Help from another person eating meals?: A Little Help from another person taking care of personal grooming?: A Lot Help from another person toileting, which includes using toliet, bedpan, or urinal?: A Lot Help from another person bathing (including washing, rinsing, drying)?: A Lot Help from another person to put on and taking off regular upper body clothing?: A Lot Help from another person to put on and taking off regular lower body clothing?: A Lot 6 Click Score: 13   End of Session Equipment Utilized During Treatment: Gait belt  Activity Tolerance: Patient tolerated treatment well Patient left: in bed;with call bell/phone within reach;with bed alarm set;Other (comment)(with MD present)  OT Visit Diagnosis: Unsteadiness on feet (R26.81);Other abnormalities of gait and mobility (R26.89);Muscle weakness (generalized) (M62.81);Pain;Cognitive communication deficit (R41.841);Other symptoms and signs involving cognitive  function                Time: 1518-3437 OT Time Calculation (min): 22 min Charges:  OT General Charges $OT Visit: 1 Visit OT Evaluation $OT Eval Low Complexity: [place], OT Acute Rehabilitation Services Pager: [phone] Office: [phone]   Hortencia Pilar 07/11/2018, 9:54 AM

## 2018-07-11 NOTE — Progress Notes (Signed)
Pt transported to MRI but staff called that she refused the process

## 2018-07-11 NOTE — Evaluation (Signed)
Physical Therapy Evaluation Patient Details Name: [name] MRN: [id number] DOB: [birth year] Today's Date: 07/11/2018   History of Present Illness  85 y.o. female with a history of diastolic heart failure, uterine cancer status post hysterectomy, arthritis, chronic pain management.  Patient seen for confusion and slurred speech. CT of the head shows chronic ischemic and atrophic changes, no acute findings. MRI pending.  Clinical Impression  Pt admitted with change in mental status and workup. (see PT Problem List). Pt will benefit from skilled PT to increase their independence and safety with mobility.  Her exact set-up and level of support at home is not know at this time.  This will likely determine if she can return home after this hospitalization or require a higher level of care.        Follow Up Recommendations Supervision/Assistance - 24 hour;Other (comment)(Level of care available at home will likely determine DC )    Equipment Recommendations  None recommended by PT    Recommendations for Other Services       Precautions / Restrictions Precautions Precautions: Fall Restrictions Weight Bearing Restrictions: No      Mobility  Bed Mobility Overal bed mobility: Needs Assistance Bed Mobility: Supine to Sit     Supine to sit: Mod assist Sit to supine: Mod assist   General bed mobility comments: needed some assist with both upper body and lower body.   Transfers Overall transfer level: Needs assistance Equipment used: Rolling walker (2 wheeled) Transfers: Sit to/from Stand Sit to Stand: Min assist         General transfer comment: to/from EOB and BSC. steadying assist  Ambulation/Gait Ambulation/Gait assistance: Min assist Gait Distance (Feet): 10 Feet Assistive device: Rolling walker (2 wheeled)(youth RW) Gait Pattern/deviations: Decreased stride length        Stairs            Wheelchair Mobility    Modified Rankin (Stroke Patients  Only) Modified Rankin (Stroke Patients Only) Pre-Morbid Rankin Score: Moderately severe disability Modified Rankin: Moderately severe disability     Balance Overall balance assessment: Needs assistance Sitting-balance support: Bilateral upper extremity supported;No upper extremity supported;Feet supported Sitting balance-Leahy Scale: Fair     Standing balance support: Bilateral upper extremity supported;During functional activity Standing balance-Leahy Scale: Poor Standing balance comment: rw for support in static stand                             Pertinent Vitals/Pain Pain Assessment: Faces Pain Score: 4  Faces Pain Scale: Hurts little more Pain Location: bilateral arms and stomach Pain Descriptors / Indicators: Aching Pain Intervention(s): Limited activity within patient's tolerance;Monitored during session    Home Living Family/patient expects to be discharged to:: Private residence Living Arrangements: Alone Available Help at Discharge: Personal care attendant Type of Home: House Home Access: Level entry     Home Layout: One level Home Equipment: Environmental consultant - 2 wheels Additional Comments: Much information obtained from medial records review and may not be currentlly accurate    Prior Function Level of Independence: Needs assistance   Gait / Transfers Assistance Needed: pt reports ambulating with rw  ADL's / Homemaking Assistance Needed: suspect she was needing some assistance with bathing/dressing PTA        Hand Dominance        Extremity/Trunk Assessment   Upper Extremity Assessment Upper Extremity Assessment: Generalized weakness;RUE deficits/detail;LUE deficits/detail RUE Deficits / Details: severe OA throughout UEs. able to grip  walker handles.  RUE Coordination: decreased fine motor LUE Deficits / Details: severe OA throughout UEs. able to grip walker handles.  LUE Coordination: decreased fine motor    Lower Extremity Assessment Lower  Extremity Assessment: RLE deficits/detail RLE Deficits / Details: RLE seemed to be slightly weaker than LLE with functional movement.  Did not MMT    Cervical / Trunk Assessment Cervical / Trunk Assessment: Kyphotic Cervical / Trunk Exceptions: scoliosis  Communication   Communication: Expressive difficulties;HOH  Cognition Arousal/Alertness: Awake/alert Behavior During Therapy: WFL for tasks assessed/performed Overall Cognitive Status: No family/caregiver present to determine baseline cognitive functioning                                 General Comments: Oriented to person and place "[place], hospital." verbal perseveration noted. STM deficits. slow processing. Word finding difficulties which pt did have some awareness of "I can't seem to get my words out right."      General Comments General comments (skin integrity, edema, etc.): No family or caregiver present. Pt's speech difficult to understand at times, therefore difficult to get some baseline information.       Exercises     Assessment/Plan    PT Assessment Patient needs continued PT services  PT Problem List Decreased strength;Decreased mobility;Pain       PT Treatment Interventions Gait training;Therapeutic activities;Therapeutic exercise;Patient/family education    PT Goals (Current goals can be found in the Care Plan section)  Acute Rehab PT Goals Patient Stated Goal: "get moving again" PT Goal Formulation: With patient Time For Goal Achievement: 07/18/18 Potential to Achieve Goals: Good    Frequency Min 3X/week   Barriers to discharge        Co-evaluation               AM-PAC PT "6 Clicks" Mobility  Outcome Measure Help needed turning from your back to your side while in a flat bed without using bedrails?: A Lot Help needed moving from lying on your back to sitting on the side of a flat bed without using bedrails?: A Lot Help needed moving to and from a bed to a chair (including a  wheelchair)?: A Little Help needed standing up from a chair using your arms (e.g., wheelchair or bedside chair)?: A Little Help needed to walk in hospital room?: A Little Help needed climbing 3-5 steps with a railing? : A Lot 6 Click Score: 15    End of Session Equipment Utilized During Treatment: Gait belt Activity Tolerance: Patient tolerated treatment well Patient left: in chair;with chair alarm set;with call bell/phone within reach Nurse Communication: Mobility status PT Visit Diagnosis: Unsteadiness on feet (R26.81)    Time: 3893-7342 PT Time Calculation (min) (ACUTE ONLY): 25 min   Charges:   PT Evaluation $PT Eval Moderate Complexity: 1 Mod PT Treatments $Gait Training: 8-22 mins        Lavonia Dana, PT   Acute Rehabilitation Services  Pager [phone] Office [phone] 07/11/2018   Melvern Banker 07/11/2018, 12:52 PM

## 2018-07-11 NOTE — Progress Notes (Signed)
PROGRESS NOTE    [name]  HFW:[id number] DOB: 1919-02-23 DOA: 07/10/2018 PCP: Celene Squibb, MD   Brief Narrative: 85 year old with past medical history significant for diastolic heart failure, uterine cancer status post hysterectomy, chronic pain due to arthritis.  Patient presented with confusion and slurred speech that is started the night prior to admission.  Patient had received her evening dose of oxycodone and then had DNR.  Caregiver was away for approximately an hour an hour when she came back patient was very confused, disoriented and had a slurry speech.  After that patient went to bed and she wake up with some improvement of her symptoms.  She was brought for further evaluation.   Assessment & Plan:   Principal Problem:   Confusion Active Problems:   PUD (peptic ulcer disease)   Chronic diastolic CHF (congestive heart failure) (HCC)   Chronic pain   1-Confusion; difficulty finding words -No evidence of infection: No leukocytosis,  Afebrile, UA without evidence of infection.  Alcohol level less than 10.  UDS negative.  X-ray no acute abnormalities. -CT head: No acute abnormality chronic ischemic changes. -MRI order -LDL; 98.  Hemoglobin A1c; 5.5 Check TSH, B12, B12 low normal Will star intramuscular injection  2-chronic diastolic heart failure Compensated.  Not  on Lasix at home.  3-chronic pain; continue with oxycodone ARN.  Peptic ulcer disease; treated with PPI        Estimated body mass index is 15.24 kg/m as calculated from the following:   Height as of this encounter: 5' (1.524 m).   Weight as of this encounter: 35.4 kg.   DVT prophylaxis: Lovenox Code Status: Full code Family Communication:  Disposition Plan: Home when improved  Consultants:   None   Procedures:   Echo   Antimicrobials:   None   Subjective: He is alert, she is confused she is not able to remember her caregiver name.  Objective: Vitals:   07/10/18 2030  07/10/18 2256 07/11/18 0028 07/11/18 0400  BP: (!) 146/72 135/85 121/75 116/60  Pulse: 72 97 77 (!) 53  Resp: 18 18  20   Temp: 98.1 F (36.7 C) 98 F (36.7 C) 97.8 F (36.6 C) 98 F (36.7 C)  TempSrc: Oral Oral Axillary Oral  SpO2: 100% 95% 94% 99%  Weight:      Height:        Intake/Output Summary (Last 24 hours) at 07/11/2018 0712 Last data filed at 07/11/2018 0434 Gross per 24 hour  Intake 1568.58 ml  Output 45 ml  Net 1523.58 ml   Filed Weights   07/10/18 1354 07/10/18 2000  Weight: 32.7 kg 35.4 kg    Examination:  General exam: Appears calm and comfortable  Respiratory system: Clear to auscultation. Respiratory effort normal. Cardiovascular system: S1 & S2 heard, RRR. No JVD, murmurs, rubs, gallops or clicks. No pedal edema. Gastrointestinal system: Abdomen is nondistended, soft and nontender. No organomegaly or masses felt. Normal bowel sounds heard. Central nervous system: Alert and oriented.  Follow commands, able to move bilateral upper and lower extremities.  Having some difficulty finding words Extremities: Symmetric 5 x 5 power. Skin: No rashes, lesions or ulcers Psychiatry: Judgement and insight appear normal. Mood & affect appropriate.     Data Reviewed: I have personally reviewed following labs and imaging studies  CBC: Recent Labs  Lab 07/10/18 1422  WBC 6.3  NEUTROABS 4.1  HGB 12.3  HCT 38.6  MCV 105.2*  PLT 027   Basic Metabolic Panel: Recent  Labs  Lab 07/10/18 1422 07/10/18 1440  NA 142  --   K 3.8  --   CL 106  --   CO2 28  --   GLUCOSE 108*  --   BUN 17  --   CREATININE 0.67 0.60  CALCIUM 9.0  --    GFR: Estimated Creatinine Clearance: 22.5 mL/min (by C-G formula based on SCr of 0.6 mg/dL). Liver Function Tests: Recent Labs  Lab 07/10/18 1422  AST 25  ALT 14  ALKPHOS 57  BILITOT 0.8  PROT 7.0  ALBUMIN 4.4   No results for input(s): LIPASE, AMYLASE in the last 168 hours. No results for input(s): AMMONIA in the last 168  hours. Coagulation Profile: Recent Labs  Lab 07/10/18 1422  INR 1.0   Cardiac Enzymes: No results for input(s): CKTOTAL, CKMB, CKMBINDEX, TROPONINI in the last 168 hours. BNP (last 3 results) No results for input(s): PROBNP in the last 8760 hours. HbA1C: Recent Labs    07/11/18 0429  HGBA1C 5.5   CBG: Recent Labs  Lab 07/10/18 1353  GLUCAP 95   Lipid Profile: Recent Labs    07/11/18 0429  CHOL 178  HDL 67  LDLCALC 98  TRIG 65  CHOLHDL 2.7   Thyroid Function Tests: No results for input(s): TSH, T4TOTAL, FREET4, T3FREE, THYROIDAB in the last 72 hours. Anemia Panel: No results for input(s): VITAMINB12, FOLATE, FERRITIN, TIBC, IRON, RETICCTPCT in the last 72 hours. Sepsis Labs: No results for input(s): PROCALCITON, LATICACIDVEN in the last 168 hours.  No results found for this or any previous visit (from the past 240 hour(s)).       Radiology Studies: Dg Chest 2 View  Result Date: 07/10/2018 CLINICAL DATA:  Altered mental status EXAM: CHEST - 2 VIEW COMPARISON:  03/11/2018 FINDINGS: Cardiac shadow is stable. Stable scoliosis concave to the left is noted. No focal infiltrate or effusion is seen. No acute bony abnormality is noted. IMPRESSION: No acute abnormality noted. Electronically Signed   By: Inez Catalina M.D.   On: 07/10/2018 15:10   Ct Head Wo Contrast  Result Date: 07/10/2018 CLINICAL DATA:  Slurred speech EXAM: CT HEAD WITHOUT CONTRAST TECHNIQUE: Contiguous axial images were obtained from the base of the skull through the vertex without intravenous contrast. COMPARISON:  02/26/2017 FINDINGS: Brain: Chronic atrophic changes are identified. Chronic white matter ischemic changes are again noted and stable. No findings to suggest acute hemorrhage, acute infarction or space-occupying mass lesion are seen. Basal ganglia calcifications are noted on the left. Vascular: No hyperdense vessel or unexpected calcification. Skull: Normal. Negative for fracture or focal  lesion. Sinuses/Orbits: No acute finding. Other: None. IMPRESSION: Chronic atrophic and ischemic changes without acute abnormality. Electronically Signed   By: Inez Catalina M.D.   On: 07/10/2018 15:14        Scheduled Meds: . diclofenac sodium  2 g Topical QID  . docusate sodium  100 mg Oral QPM  . enoxaparin (LOVENOX) injection  30 mg Subcutaneous Q24H  . pantoprazole  40 mg Oral q morning - 10a  . polyethylene glycol  17 g Oral Daily   Continuous Infusions: . sodium chloride 75 mL/hr at 07/11/18 0434     LOS: 0 days    Time spent: 35 minutes.     Elmarie Shiley, MD [facility]  07/11/2018, 7:12 AM

## 2018-07-11 NOTE — Progress Notes (Signed)
Pt back to the floor, paged NP regarding pt's refusal

## 2018-07-11 NOTE — Progress Notes (Signed)
  Echocardiogram 2D Echocardiogram has been performed.  [name] 07/11/2018, 5:16 PM

## 2018-07-11 NOTE — Progress Notes (Signed)
SLP Cancellation Note  Patient Details Name: RYN PEINE MRN: [id number] DOB: [birth year]   Cancelled treatment:       Reason Eval/Treat Not Completed: Patient at procedure or test/unavailable. Pt having ECHO, will follow up for cognitive-linguistic assessment.  Deneise Lever, [place], CCC-SLP Speech-Language Pathologist Acute Rehabilitation Services Pager: [phone] Office: [phone]    Aliene Altes 07/11/2018, 4:22 PM

## 2018-07-11 NOTE — Progress Notes (Signed)
Pt left for MRI,

## 2018-07-11 NOTE — Progress Notes (Signed)
This is the second attempt to obtain an MRI, she refused this morning @6a  3/1 and I just spoke to her RN and she is refusing again. RN to notify MD of refusal.

## 2018-07-12 ENCOUNTER — Observation Stay (HOSPITAL_COMMUNITY): Payer: Medicare HMO

## 2018-07-12 DIAGNOSIS — R4182 Altered mental status, unspecified: Secondary | ICD-10-CM | POA: Diagnosis not present

## 2018-07-12 DIAGNOSIS — R410 Disorientation, unspecified: Secondary | ICD-10-CM | POA: Diagnosis not present

## 2018-07-12 LAB — URINE CULTURE: CULTURE: NO GROWTH

## 2018-07-12 LAB — BASIC METABOLIC PANEL
Anion gap: 14 (ref 5–15)
BUN: 14 mg/dL (ref 8–23)
CO2: 21 mmol/L — AB (ref 22–32)
Calcium: 9.6 mg/dL (ref 8.9–10.3)
Chloride: 105 mmol/L (ref 98–111)
Creatinine, Ser: 0.64 mg/dL (ref 0.44–1.00)
GFR calc Af Amer: >60 mL/min (ref >60)
GFR calc non Af Amer: >60 mL/min (ref >60)
Glucose, Bld: 96 mg/dL (ref 70–99)
Potassium: 3.3 mmol/L — ABNORMAL LOW (ref 3.5–5.1)
Sodium: 140 mmol/L (ref 135–145)

## 2018-07-12 LAB — ECHOCARDIOGRAM COMPLETE
Height: 60 in
Weight: 1248.69 oz

## 2018-07-12 MED ORDER — OXYCODONE-ACETAMINOPHEN 10-325 MG PO TABS: 0.5000 | ORAL_TABLET | Freq: Four times a day (QID) | ORAL | 0 refills | Status: DC | PRN

## 2018-07-12 MED ORDER — OXYCODONE-ACETAMINOPHEN 5-325 MG PO TABS: 1.0000 | ORAL_TABLET | Freq: Four times a day (QID) | ORAL | 0 refills | Status: DC | PRN

## 2018-07-12 MED ORDER — CLOPIDOGREL BISULFATE 75 MG PO TABS: 75.0000 mg | ORAL_TABLET | Freq: Every day | ORAL | 0 refills | Status: DC

## 2018-07-12 MED ORDER — CLOPIDOGREL BISULFATE 75 MG PO TABS
75.0000 mg | ORAL_TABLET | Freq: Every day | ORAL | Status: DC
  Administered 2018-07-12 – 2018-07-15 (×4): 75 mg via ORAL
  Filled 2018-07-12 (×4): qty 1

## 2018-07-12 MED ORDER — CYANOCOBALAMIN 100 MCG PO TABS: 100.0000 ug | ORAL_TABLET | Freq: Every day | ORAL | 0 refills | Status: DC

## 2018-07-12 MED ORDER — CYANOCOBALAMIN 1000 MCG/ML IJ SOLN
1000.0000 ug | Freq: Once | INTRAMUSCULAR | Status: AC
  Administered 2018-07-12: 1000 ug via INTRAMUSCULAR
  Filled 2018-07-12: qty 1

## 2018-07-12 MED ORDER — POTASSIUM CHLORIDE CRYS ER 20 MEQ PO TBCR
40.0000 meq | EXTENDED_RELEASE_TABLET | Freq: Once | ORAL | Status: AC
  Administered 2018-07-12: 40 meq via ORAL
  Filled 2018-07-12: qty 2

## 2018-07-12 MED ORDER — VITAMIN B-12 100 MCG PO TABS
100.0000 ug | ORAL_TABLET | Freq: Every day | ORAL | Status: DC
  Administered 2018-07-13 – 2018-07-15 (×3): 100 ug via ORAL
  Filled 2018-07-12 (×3): qty 1

## 2018-07-12 MED ORDER — ENSURE ENLIVE PO LIQD: 237.0000 mL | Freq: Two times a day (BID) | ORAL | Status: DC

## 2018-07-12 NOTE — Discharge Summary (Addendum)
Physician Discharge Summary  [name] YFV:[id number] DOB: Nov 18, 1918 DOA: 07/10/2018  PCP: Celene Squibb, MD  Admit date: 07/10/2018 Discharge date: 07/12/2018  Admitted From: Home  Disposition:  Home   Recommendations for Outpatient Follow-up:  1. Follow up with PCP in 1-2 weeks 2. Please obtain BMP/CBC in one week 3. Follow up with PCP, pain medication dose was decrease. Also notice UDS was negative for opioid.  Recommend close follow up./  4. Consider referral to palliative care.   Home Health: yes  Discharge Condition: stable.  CODE STATUS: full code Diet recommendation: Regular   Brief/Interim Summary: 85 year old with past medical history significant for diastolic heart failure, uterine cancer status post hysterectomy, chronic pain due to arthritis.  Patient presented with confusion and slurred speech that is started the night prior to admission.  Patient had received her evening dose of oxycodone and then had DNR.  Caregiver was away for approximately an hour an hour when she came back patient was very confused, disoriented and had a slurry speech.  After that patient went to bed and she wake up with some improvement of her symptoms.  She was brought for further evaluation.   1-Confusion; difficulty finding words -No evidence of infection: No leukocytosis,  Afebrile, UA without evidence of infection.  Alcohol level less than 10.  UDS negative.  X-ray no acute abnormalities. -CT head: No acute abnormality chronic ischemic changes. Repeated CT head negative.  -we were not able to do MRI, patient refuse. Discussed with daughter, we decide to repeat CT head, knowing is not as good as MRI. Will start plavix empirically to cover for TIA/Stroke.  -LDL; 98.  Hemoglobin A1c; 5.5. avoid statin due to age, poor oral intake.  -TSH;0.831, B;12; 204, B12 low normal Will star intramuscular injection. Discharge on oral supplement.  -plan to discharge today if 24 hours care can be arrange.   -Daughter updated.   2-Chronic diastolic heart failure Compensated.  Not  on Lasix at home.  3-Chronic pain; continue with oxycodone PRN, will reduce dose.  UDS was negative.   4-Peptic ulcer disease; treated with PPI  5-nutrition;  Per nursing report poor oral intake.  Nutrition consultation.  Start ensure.   Family wont be able to provide 24 hours care. Needs SNF  Discharge Diagnoses:  Principal Problem:   Confusion Active Problems:   PUD (peptic ulcer disease)   Chronic diastolic CHF (congestive heart failure) (HCC)   Chronic pain    Discharge Instructions  Discharge Instructions    Diet - low sodium heart healthy   Complete by:  As directed    Increase activity slowly   Complete by:  As directed      Allergies as of 07/12/2018      Reactions   Sulfa Antibiotics Other (See Comments)   Reaction:  Unknown    Aspirin Other (See Comments)   Reaction:  Burning of pts stomach       Medication List    TAKE these medications   acetaminophen 500 MG tablet Commonly known as:  TYLENOL Take 500-1,000 mg by mouth every 6 (six) hours as needed for mild pain, moderate pain, fever or headache.   ALPRAZolam 0.25 MG tablet Commonly known as:  XANAX Take 0.25 mg by mouth daily as needed for anxiety.   alum & mag hydroxide-simeth 200-200-20 MG/5ML suspension Commonly known as:  MAALOX REGULAR STRENGTH Take 15 mLs by mouth every 6 (six) hours as needed for indigestion or heartburn.   clopidogrel 75  MG tablet Commonly known as:  PLAVIX Take 1 tablet (75 mg total) by mouth daily. Start taking on:  July 13, 2018   cyanocobalamin 100 MCG tablet Take 1 tablet (100 mcg total) by mouth daily. Start taking on:  July 13, 2018   diclofenac sodium 1 % Gel Commonly known as:  VOLTAREN Apply 2 g topically 4 (four) times daily.   docusate sodium 100 MG capsule Commonly known as:  COLACE Take 100 mg by mouth every evening.   oxyCODONE-acetaminophen 10-325 MG  tablet Commonly known as:  PERCOCET Take 0.5 tablets by mouth every 6 (six) hours as needed for pain. What changed:  how much to take   pantoprazole 40 MG tablet Commonly known as:  PROTONIX Take 40 mg by mouth every morning.   polyethylene glycol packet Commonly known as:  MIRALAX / GLYCOLAX Take 17 g by mouth daily.   PROAIR HFA 108 (90 Base) MCG/ACT inhaler Generic drug:  albuterol Inhale 1-2 puffs into the lungs every 6 (six) hours as needed for wheezing or shortness of breath.       Allergies  Allergen Reactions  . Sulfa Antibiotics Other (See Comments)    Reaction:  Unknown   . Aspirin Other (See Comments)    Reaction:  Burning of pts stomach     Consultations: none  Procedures/Studies: Dg Chest 2 View  Result Date: 07/10/2018 CLINICAL DATA:  Altered mental status EXAM: CHEST - 2 VIEW COMPARISON:  03/11/2018 FINDINGS: Cardiac shadow is stable. Stable scoliosis concave to the left is noted. No focal infiltrate or effusion is seen. No acute bony abnormality is noted. IMPRESSION: No acute abnormality noted. Electronically Signed   By: Inez Catalina M.D.   On: 07/10/2018 15:10   Ct Head Wo Contrast  Result Date: 07/12/2018 CLINICAL DATA:  Confusion.  Altered mental status EXAM: CT HEAD WITHOUT CONTRAST TECHNIQUE: Contiguous axial images were obtained from the base of the skull through the vertex without intravenous contrast. COMPARISON:  Head CT 07/10/2018 FINDINGS: Brain: No acute intracranial hemorrhage. No focal mass lesion. No CT evidence of acute infarction. No midline shift or mass effect. No hydrocephalus. Basilar cisterns are patent. There are periventricular and subcortical white matter hypodensities. Generalized cortical atrophy. Marked atrophy along the temporal lobes. Extensive periventricular white matter hypodensities. Enlarged ventricles. Findings are all stable from comparison exam. Vascular: No hyperdense vessel or unexpected calcification. Skull: Normal.  Negative for fracture or focal lesion. Sinuses/Orbits: Paranasal sinuses and mastoid air cells are clear. Orbits are clear. Other: None. IMPRESSION: 1. Atrophy and white matter microvascular disease not changed from prior. 2. No acute findings. Electronically Signed   By: Suzy Bouchard M.D.   On: 07/12/2018 10:40   Ct Head Wo Contrast  Result Date: 07/10/2018 CLINICAL DATA:  Slurred speech EXAM: CT HEAD WITHOUT CONTRAST TECHNIQUE: Contiguous axial images were obtained from the base of the skull through the vertex without intravenous contrast. COMPARISON:  02/26/2017 FINDINGS: Brain: Chronic atrophic changes are identified. Chronic white matter ischemic changes are again noted and stable. No findings to suggest acute hemorrhage, acute infarction or space-occupying mass lesion are seen. Basal ganglia calcifications are noted on the left. Vascular: No hyperdense vessel or unexpected calcification. Skull: Normal. Negative for fracture or focal lesion. Sinuses/Orbits: No acute finding. Other: None. IMPRESSION: Chronic atrophic and ischemic changes without acute abnormality. Electronically Signed   By: Inez Catalina M.D.   On: 07/10/2018 15:14     Subjective: Alert, confuse.  HR at 60 on telemetry  Discharge Exam: Vitals:   07/12/18 0736 07/12/18 1123  BP: (!) 151/69 (!) 145/53  Pulse: (!) 46 (!) 49  Resp: 18 16  Temp: 98.5 F (36.9 C) 98.6 F (37 C)  SpO2: 100% 99%   Vitals:   07/12/18 0011 07/12/18 0613 07/12/18 0736 07/12/18 1123  BP: (!) 119/101 (!) 141/69 (!) 151/69 (!) 145/53  Pulse: 73 (!) 51 (!) 46 (!) 49  Resp: 18 18 18 16   Temp: (!) 97.4 F (36.3 C) 98 F (36.7 C) 98.5 F (36.9 C) 98.6 F (37 C)  TempSrc: Oral Oral Oral Axillary  SpO2: 98% 97% 100% 99%  Weight:      Height:        General: Pt is alert, awake, not in acute distress Cardiovascular: RRR, S1/S2 +, no rubs, no gallops Respiratory: CTA bilaterally, no wheezing, no rhonchi Abdominal: Soft, NT, ND, bowel  sounds + Extremities: no edema, no cyanosis    The results of significant diagnostics from this hospitalization (including imaging, microbiology, ancillary and laboratory) are listed below for reference.     Microbiology: Recent Results (from the past 240 hour(s))  Urine Culture     Status: None   Collection Time: 07/10/18  1:58 PM  Result Value Ref Range Status   Specimen Description   Final    URINE, CLEAN CATCH Performed at [facility], [address] [place], [place] [address]    Special Requests   Final    NONE Performed at [facility], [address] [place], [place] [address]    Culture   Final    NO GROWTH Performed at [facility], [place] [address] [place], [place] [address]    Report Status 07/12/2018 FINAL  Final     Labs: BNP (last 3 results) Recent Labs    11/08/17 1630  BNP 297.9*   Basic Metabolic Panel: Recent Labs  Lab 07/10/18 1422 07/10/18 1440 07/12/18 0915  NA 142  --  140  K 3.8  --  3.3*  CL 106  --  105  CO2 28  --  21*  GLUCOSE 108*  --  96  BUN 17  --  14  CREATININE 0.67 0.60 0.64  CALCIUM 9.0  --  9.6   Liver Function Tests: Recent Labs  Lab 07/10/18 1422  AST 25  ALT 14  ALKPHOS 57  BILITOT 0.8  PROT 7.0  ALBUMIN 4.4   No results for input(s): LIPASE, AMYLASE in the last 168 hours. No results for input(s): AMMONIA in the last 168 hours. CBC: Recent Labs  Lab 07/10/18 1422  WBC 6.3  NEUTROABS 4.1  HGB 12.3  HCT 38.6  MCV 105.2*  PLT 219   Cardiac Enzymes: No results for input(s): CKTOTAL, CKMB, CKMBINDEX, TROPONINI in the last 168 hours. BNP: Invalid input(s): POCBNP CBG: Recent Labs  Lab 07/10/18 1353  GLUCAP 95   D-Dimer No results for input(s): DDIMER in the last 72 hours. Hgb A1c Recent Labs    07/11/18 0429  HGBA1C 5.5   Lipid Profile Recent Labs    07/11/18 0429  CHOL 178  HDL 67  LDLCALC 98  TRIG 65  CHOLHDL 2.7   Thyroid function studies Recent Labs    07/11/18 0932   TSH 0.831   Anemia work up Recent Labs    07/11/18 0932  VITAMINB12 204   Urinalysis    Component Value Date/Time   COLORURINE YELLOW 07/10/2018 [place] 07/10/2018 1358   [place]  1.012 07/10/2018 1358   PHURINE 7.0 07/10/2018 1358   GLUCOSEU NEGATIVE 07/10/2018 1358   HGBUR MODERATE (A) 07/10/2018 [place] 07/10/2018 1358   KETONESUR NEGATIVE 07/10/2018 1358   PROTEINUR NEGATIVE 07/10/2018 1358   UROBILINOGEN 0.2 03/13/2015 0120   NITRITE NEGATIVE 07/10/2018 1358   LEUKOCYTESUR NEGATIVE 07/10/2018 1358   Sepsis Labs Invalid input(s): PROCALCITONIN,  WBC,  LACTICIDVEN Microbiology Recent Results (from the past 240 hour(s))  Urine Culture     Status: None   Collection Time: 07/10/18  1:58 PM  Result Value Ref Range Status   Specimen Description   Final    URINE, CLEAN CATCH Performed at [facility], [address] [place], [place] [address]    Special Requests   Final    NONE Performed at [facility], [address], [place], [place] [address]    Culture   Final    NO GROWTH Performed at [facility], [place] [address] [place],  [address]    Report Status 07/12/2018 FINAL  Final     Time coordinating discharge: 35 minutes.   SIGNED:   Elmarie Shiley, MD  [facility] 07/12/2018, 12:35 PM

## 2018-07-12 NOTE — NC FL2 (Signed)
[place] LEVEL OF CARE SCREENING TOOL     IDENTIFICATION  Patient Name: [name] Birthdate: 06-Oct-1918 Sex: female Admission Date (Current Location): 07/10/2018  [facility] and [place] Number:  Herbalist and Address:  [facility], [place] [address] [place], [place] [address]      Provider Number: [id number]  Attending Physician Name and Address:  Elmarie Shiley, MD  Relative Name and Phone Number:  Leta Baptist, Daughter, [phone] & Justine Null, Daughter, [phone]    Current Level of Care: Hospital Recommended Level of Care: [place] Prior Approval Number:    Date Approved/Denied: 03/28/08 PASRR Number: [id number]  Discharge Plan: SNF    Current Diagnoses: Patient Active Problem List   Diagnosis Date Noted  . Confusion 07/10/2018  . Chronic pain 07/10/2018  . Closed fracture of distal end of right humerus with routine healing 01/04/2018  . Multiple rib fractures 05/09/2016  . Fall 05/09/2016  . Leukocytosis 05/09/2016  . Chest pain 03/13/2015  . Other specified fever   . Pain in the chest   . Abdominal pain 10/28/2014  . Anemia 10/28/2014  . Hypokalemia 10/28/2014  . Protein-calorie malnutrition, severe ([place]) 10/28/2014  . Macrocytic anemia 08/13/2014  . Chronic diastolic CHF (congestive heart failure) ([place]) 08/13/2014  . PUD (peptic ulcer disease) 10/05/2013  . Acute gastric ulcer due to Helicobacter pylori 78/58/8502  . Right kidney stone 12/31/2012  . Hypotension 12/31/2012  . Pancreatic atrophy 12/31/2012    Orientation RESPIRATION BLADDER Height & Weight     (Disorientated x4)  Normal Continent Weight: 78 lb 0.7 oz (35.4 kg) Height:  5' (152.4 cm)  BEHAVIORAL SYMPTOMS/MOOD NEUROLOGICAL BOWEL NUTRITION STATUS      Continent Diet(Low sodium/Heart healthy, thin liquids)  AMBULATORY STATUS COMMUNICATION OF NEEDS Skin   Limited Assist Verbally Normal                     Personal Care Assistance Level of Assistance  Bathing, Feeding, Dressing, Total care Bathing Assistance: Limited assistance Feeding assistance: Limited assistance Dressing Assistance: Limited assistance Total Care Assistance: Limited assistance   Functional Limitations Info  Sight, Hearing, Speech Sight Info: Impaired(Wears glasses) Hearing Info: Impaired Speech Info: Adequate    SPECIAL CARE FACTORS FREQUENCY  OT (By licensed OT), PT (By licensed PT), Speech therapy     PT Frequency: 5x/wk OT Frequency: 5x/wk     Speech Therapy Frequency: 3x/wk      Contractures Contractures Info: Not present    Additional Factors Info  Code Status, Allergies Code Status Info: Full Code Allergies Info: Sulfa Antibiotics, Aspirin           Current Medications (07/12/2018):  This is the current hospital active medication list Current Facility-Administered Medications  Medication Dose Route Frequency Provider Last Rate Last Dose  . acetaminophen (TYLENOL) tablet 650 mg  650 mg Oral Q4H PRN Truett Mainland, DO   650 mg at 07/12/18 1258   Or  . acetaminophen (TYLENOL) solution 650 mg  650 mg Per Tube Q4H PRN Truett Mainland, DO       Or  . acetaminophen (TYLENOL) suppository 650 mg  650 mg Rectal Q4H PRN Truett Mainland, DO      . albuterol (PROVENTIL) (2.5 MG/3ML) 0.083% nebulizer solution 3 mL  3 mL Inhalation Q6H PRN Truett Mainland, DO      . ALPRAZolam Duanne Moron) tablet 0.25 mg  0.25 mg Oral Daily PRN Loma Boston  J, DO   0.25 mg at 07/11/18 1942  . clopidogrel (PLAVIX) tablet 75 mg  75 mg Oral Daily Regalado, Belkys A, MD   75 mg at 07/12/18 0923  . diclofenac sodium (VOLTAREN) 1 % transdermal gel 2 g  2 g Topical QID Truett Mainland, DO   2 g at 07/12/18 6812  . docusate sodium (COLACE) capsule 100 mg  100 mg Oral QPM Truett Mainland, DO   100 mg at 07/11/18 1739  . enoxaparin (LOVENOX) injection 30 mg  30 mg Subcutaneous Q24H Truett Mainland, DO   30 mg at  07/11/18 2108  . feeding supplement (ENSURE ENLIVE) (ENSURE ENLIVE) liquid 237 mL  237 mL Oral BID BM Regalado, Belkys A, MD      . oxyCODONE-acetaminophen (PERCOCET/ROXICET) 5-325 MG per tablet 1 tablet  1 tablet Oral Q6H PRN Truett Mainland, DO      . pantoprazole (PROTONIX) EC tablet 40 mg  40 mg Oral q morning - 10a Truett Mainland, DO   40 mg at 07/11/18 1054  . polyethylene glycol (MIRALAX / GLYCOLAX) packet 17 g  17 g Oral Daily Truett Mainland, DO   17 g at 07/11/18 1054  . senna-docusate (Senokot-S) tablet 1 tablet  1 tablet Oral QHS PRN Truett Mainland, DO      . [START ON 07/13/2018] vitamin B-12 (CYANOCOBALAMIN) tablet 100 mcg  100 mcg Oral Daily Regalado, Belkys A, MD         Discharge Medications: Please see discharge summary for a list of discharge medications.  Relevant Imaging Results:  Relevant Lab Results:   Additional Information SSI: [id number]  [place], LCSWA

## 2018-07-12 NOTE — Progress Notes (Signed)
Nurse offer patient tylenol Around 9:26am this morning and she refused. She only took her Plavix this morning. She refused the rest of her AM medications. Family came in around 1pm and requested pain medication but the patient refused to open her mouth to take the tylenol or the the schedule potassium.

## 2018-07-12 NOTE — Progress Notes (Signed)
Paged on call regarding pt's refusal to obtain MRI

## 2018-07-12 NOTE — Progress Notes (Signed)
  SLP Cancellation Note  Patient Details Name: SHANTORIA ELLWOOD MRN: [id number] DOB: 1918/05/22   Cancelled treatment:        Patient at test/procedure. Will see at next available time.    Charlynne Cousins Nachelle Negrette, MA, CCC-SLP 07/12/2018 10:04 AM

## 2018-07-13 DIAGNOSIS — R410 Disorientation, unspecified: Secondary | ICD-10-CM | POA: Diagnosis not present

## 2018-07-13 LAB — BASIC METABOLIC PANEL
Anion gap: 15 (ref 5–15)
BUN: 21 mg/dL (ref 8–23)
CO2: 17 mmol/L — ABNORMAL LOW (ref 22–32)
Calcium: 8.8 mg/dL — ABNORMAL LOW (ref 8.9–10.3)
Chloride: 107 mmol/L (ref 98–111)
Creatinine, Ser: 0.76 mg/dL (ref 0.44–1.00)
GFR calc Af Amer: >60 mL/min (ref >60)
GFR calc non Af Amer: >60 mL/min (ref >60)
Glucose, Bld: 65 mg/dL — ABNORMAL LOW (ref 70–99)
Potassium: 3.2 mmol/L — ABNORMAL LOW (ref 3.5–5.1)
Sodium: 139 mmol/L (ref 135–145)

## 2018-07-13 LAB — MAGNESIUM: MAGNESIUM: 1.8 mg/dL (ref 1.7–2.4)

## 2018-07-13 LAB — PHOSPHORUS: Phosphorus: 3.1 mg/dL (ref 2.5–4.6)

## 2018-07-13 MED ORDER — SODIUM BICARBONATE 650 MG PO TABS
1300.0000 mg | ORAL_TABLET | Freq: Two times a day (BID) | ORAL | Status: DC
  Administered 2018-07-13 – 2018-07-15 (×5): 1300 mg via ORAL
  Filled 2018-07-13 (×6): qty 2

## 2018-07-13 MED ORDER — POTASSIUM CHLORIDE 10 MEQ/100ML IV SOLN: 10.0000 meq | INTRAVENOUS | Status: DC

## 2018-07-13 MED ORDER — BOOST / RESOURCE BREEZE PO LIQD CUSTOM
1.0000 | Freq: Three times a day (TID) | ORAL | Status: DC
  Administered 2018-07-13 – 2018-07-15 (×6): 1 via ORAL

## 2018-07-13 MED ORDER — POTASSIUM CHLORIDE CRYS ER 20 MEQ PO TBCR
20.0000 meq | EXTENDED_RELEASE_TABLET | Freq: Once | ORAL | Status: AC
  Administered 2018-07-13: 20 meq via ORAL
  Filled 2018-07-13: qty 1

## 2018-07-13 MED ORDER — SODIUM CHLORIDE 0.9 % IV BOLUS
500.0000 mL | Freq: Once | INTRAVENOUS | Status: AC
  Administered 2018-07-13: 500 mL via INTRAVENOUS

## 2018-07-13 MED ORDER — POTASSIUM CHLORIDE 10 MEQ/100ML IV SOLN
10.0000 meq | INTRAVENOUS | Status: AC
  Administered 2018-07-13 (×4): 10 meq via INTRAVENOUS
  Filled 2018-07-13 (×5): qty 100

## 2018-07-13 MED ORDER — DEXTROSE-NACL 5-0.9 % IV SOLN
INTRAVENOUS | Status: DC
  Administered 2018-07-13 – 2018-07-14 (×2): via INTRAVENOUS

## 2018-07-13 MED ORDER — SODIUM CHLORIDE 0.9 % IV BOLUS: 250.0000 mL | Freq: Once | INTRAVENOUS | Status: DC

## 2018-07-13 MED ORDER — DEXTROSE 50 % IV SOLN
1.0000 | Freq: Once | INTRAVENOUS | Status: AC
  Administered 2018-07-13: 50 mL via INTRAVENOUS
  Filled 2018-07-13: qty 50

## 2018-07-13 MED ORDER — MAGNESIUM SULFATE 2 GM/50ML IV SOLN
2.0000 g | Freq: Once | INTRAVENOUS | Status: AC
  Administered 2018-07-13: 2 g via INTRAVENOUS
  Filled 2018-07-13 (×2): qty 50

## 2018-07-13 MED ORDER — SODIUM CHLORIDE 0.9 % IV SOLN
INTRAVENOUS | Status: DC
  Administered 2018-07-13: 08:00:00 via INTRAVENOUS

## 2018-07-13 NOTE — Progress Notes (Signed)
[facility] notified that patient had no void since AM bladder scan 0 mls. Arthor Captain LPN

## 2018-07-13 NOTE — Progress Notes (Signed)
Initial Nutrition Assessment  DOCUMENTATION CODES:   Underweight  INTERVENTION:  Provide Boost Breeze po TID, each supplement provides 250 kcal and 9 grams of protein  Encourage adequate PO intake.   NUTRITION DIAGNOSIS:   Increased nutrient needs related to chronic illness(CHF) as evidenced by estimated needs.  GOAL:   Patient will meet greater than or equal to 90% of their needs  MONITOR:   PO intake, Supplement acceptance, Labs, Weight trends, I & O's, Skin  REASON FOR ASSESSMENT:   Consult Assessment of nutrition requirement/status  ASSESSMENT:   85 year old with past medical history significant for diastolic heart failure, uterine cancer status post hysterectomy, chronic pain due to arthritis. Patient presented with confusion and slurred speech with questionable TIA.  Pt unavailable during time of visit. RD unable to obtain most recent nutrition history. Meal completion has been varied from refusal to 50% intake. Pt currently has Ensure ordered, however has been refusing them. RD to order Boost Breeze supplements instead to continue to aid in caloric and protein needs.    Unable to complete Nutrition-Focused physical exam at this time.   Labs and medications reviewed.   Diet Order:   Diet Order            Diet - low sodium heart healthy        Diet Heart Room service appropriate? Yes; Fluid consistency: Thin  Diet effective now              EDUCATION NEEDS:   Not appropriate for education at this time  Skin:  Skin Assessment: Reviewed RN Assessment  Last BM:  3/3  Height:   Ht Readings from Last 1 Encounters:  07/10/18 5' (1.524 m)    Weight:   Wt Readings from Last 1 Encounters:  07/10/18 35.4 kg    Ideal Body Weight:  45.45 kg  BMI:  Body mass index is 15.24 kg/m.  Estimated Nutritional Needs:   Kcal:  1200-1500  Protein:  45-55 grams  Fluid:  >/= 1.5 L/day    Corrin Parker, MS, RD, LDN Pager # [phone] After hours/  weekend pager # [phone]

## 2018-07-13 NOTE — Clinical Social Work Note (Signed)
Clinical Social Work Assessment  Patient Details  Name: [name] MRN: [id number] Date of Birth: Apr 05, 1919  Date of referral:  07/12/18               Reason for consult:  Facility Placement                Permission sought to share information with:  Chartered certified accountant granted to share information::  Yes, Verbal Permission Granted  Name::     Otho Bellows  Agency::  SNF  Relationship::  Daughters  Contact Information:     Housing/Transportation Living arrangements for the past 2 months:  [place] of Information:  Medical Team, Adult Children Patient Interpreter Needed:  None Criminal Activity/Legal Involvement Pertinent to Current Situation/Hospitalization:  No - Comment as needed Significant Relationships:  Adult Children Lives with:  Self, Other (Comment) Do you feel safe going back to the place where you live?  Yes Need for family participation in patient care:  Yes (Comment)  Care giving concerns:  Patient from home alone but with caregivers set up. Patient does not think that she needs help at home, however, and she will kick the caregivers out at times. Patient's daughters discussed how they are frustrated with how the patient treats her caregivers and how it makes it harder for them to ensure that the patient has caregivers around the clock.    Social Worker assessment / plan:  CSW spoke with both daughter, Peter Congo and Dwana Melena, today on separate occasions to discuss discharge plan. CSW discussed with Peter Congo and Dwana Melena how the patient will require 24/7 care and assistance at discharge. Peter Congo at first said that she would arrange 24 hour caregivers for the patient, that they did not think she would do well in a nursing home, but expressed concern about the difficulties they've been having recently with the patient and her caregivers. Peter Congo said she would try to find caregivers to cover the next few days to ensure a safe  discharge. CSW spoke with Dwana Melena afterwards to discuss her concerns, and how they had attempted over the years to get the patient to move into ALF in [place] near [place], but the patient has always refused. CSW provided information to Dwana Melena that the patient does not have cognitive capacity per MD to refuse care or make decisions on what care she needs. Peter Congo called CSW back later and said that she would be unable to find 24/7 care for the patient, the patient will need placement. CSW completed referral and will follow.  Employment status:  Retired Nurse, adult PT Recommendations:  [address] [place] / Referral to community resources:  [place]  Patient/Family's Response to care:  Patient's daughters both in agreement to SNF placement.  Patient/Family's Understanding of and Emotional Response to Diagnosis, Current Treatment, and Prognosis:  Patient's daughters discussed their frustrations with the patient and the care that she needs, and how she makes it difficult to ensure that she's safe in her home. Per Dwana Melena, she said that it's like the patient has a "vice grip" on staying in her home. Patient's daughters both acknowledge that the patient is not safe to be home alone, and that they've been feeling that way for some time but the patient is resistant to receiving any help.   Emotional Assessment Appearance:  Appears stated age Attitude/Demeanor/Rapport:  Unable to Assess Affect (typically observed):  Unable to Assess Orientation:  Oriented to Self Alcohol / Substance use:  Not Applicable Psych involvement (Current and /or in the community):  No (Comment)  Discharge Needs  Concerns to be addressed:  Care Coordination Readmission within the last 30 days:  No Current discharge risk:  Physical Impairment, Dependent with Mobility, Cognitively Impaired, Lives alone Barriers to Discharge:  Continued Medical Work up, [place], [place] 07/13/2018, 12:26 PM

## 2018-07-13 NOTE — Progress Notes (Signed)
Physical Therapy Treatment Patient Details Name: ZYAIR RHEIN MRN: [id number] DOB: 12-09-1918 Today's Date: 07/13/2018    History of Present Illness 85 y.o. female with a history of diastolic heart failure, uterine cancer status post hysterectomy, arthritis, chronic pain management.  Patient seen for confusion and slurred speech. CT of the head shows chronic ischemic and atrophic changes, no acute findings. MRI pending.    PT Comments    Patient very sleepy upon PT arrival. Tolerated standing and taking a few steps to get to/from [facility] with Min A for balance/safety. Pt coughing up phlegm while sitting on BSC. Reports having a caregiver at home daily but not 24/7 assist. Speech difficult to comprehend at times. Pt requires Min-Mod A for all mobility at this time and not safe to be home alone. Recommend SNF to maximize independence and mobility prior to return home. Will follow acutely.    Follow Up Recommendations  SNF;Supervision for mobility/OOB;Supervision/Assistance - 24 hour     Equipment Recommendations  None recommended by PT    Recommendations for Other Services       Precautions / Restrictions Precautions Precautions: Fall Restrictions Weight Bearing Restrictions: No    Mobility  Bed Mobility Overal bed mobility: Needs Assistance Bed Mobility: Supine to Sit;Sit to Supine     Supine to sit: Min assist;HOB elevated Sit to supine: Mod assist;HOB elevated   General bed mobility comments: Increased time to get to EOB due to lethargy, woke up once sitting EOB. Assist to bring LEs into bed to return to supine.  Transfers Overall transfer level: Needs assistance Equipment used: Rolling walker (2 wheeled)(youth) Transfers: Sit to/from Stand Sit to Stand: Min assist         General transfer comment: Assist to power to standing from EOB x1, from [facility] x1. Slow to rise.   Ambulation/Gait Ambulation/Gait assistance: Min assist Gait Distance (Feet): 5 Feet Assistive  device: Rolling walker (2 wheeled)(youth) Gait Pattern/deviations: Step-to pattern;Shuffle Gait velocity: decreased   General Gait Details: Able to take a few steps to get from bed to/from [facility] with youth RW for support, very slow.   Stairs             Wheelchair Mobility    Modified Rankin (Stroke Patients Only) Modified Rankin (Stroke Patients Only) Pre-Morbid Rankin Score: Moderately severe disability Modified Rankin: Moderately severe disability     Balance Overall balance assessment: Needs assistance Sitting-balance support: Feet supported;No upper extremity supported Sitting balance-Leahy Scale: Fair     Standing balance support: During functional activity Standing balance-Leahy Scale: Poor Standing balance comment: rw for support in static stand, total A for pericare                            Cognition Arousal/Alertness: Lethargic Behavior During Therapy: WFL for tasks assessed/performed Overall Cognitive Status: No family/caregiver present to determine baseline cognitive functioning                                 General Comments: Slow processing vs HOH. Oriented to place and March, year "1918"      Exercises      General Comments General comments (skin integrity, edema, etc.): Speech difficult to comprehend at times. Pt coughing up phlegm while sitting on BSC.       Pertinent Vitals/Pain Pain Assessment: Faces Faces Pain Scale: No hurt    Home Living  Prior Function            PT Goals (current goals can now be found in the care plan section) Progress towards PT goals: Progressing toward goals(slowly)    Frequency    Min 2X/week      PT Plan Current plan remains appropriate;Frequency needs to be updated    Co-evaluation              AM-PAC PT "6 Clicks" Mobility   Outcome Measure  Help needed turning from your back to your side while in a flat bed without using  bedrails?: A Little Help needed moving from lying on your back to sitting on the side of a flat bed without using bedrails?: A Little Help needed moving to and from a bed to a chair (including a wheelchair)?: A Little Help needed standing up from a chair using your arms (e.g., wheelchair or bedside chair)?: A Little Help needed to walk in hospital room?: A Little Help needed climbing 3-5 steps with a railing? : A Lot 6 Click Score: 17    End of Session Equipment Utilized During Treatment: Gait belt Activity Tolerance: Patient tolerated treatment well Patient left: in bed;with call bell/phone within reach;with nursing/sitter in room(RN present to scan bladder) Nurse Communication: Mobility status PT Visit Diagnosis: Unsteadiness on feet (R26.81)     Time: 1610-9604 PT Time Calculation (min) (ACUTE ONLY): 27 min  Charges:  $Therapeutic Activity: 23-37 mins                     Wray Kearns, PT, DPT Acute Rehabilitation Services Pager [phone] Office [place] 07/13/2018, 1:09 PM

## 2018-07-13 NOTE — Evaluation (Signed)
Speech Language Pathology Evaluation Patient Details Name: [name] MRN: [id number] DOB: [birth year] Today's Date: 07/13/2018 Time: 4132-4401 SLP Time Calculation (min) (ACUTE ONLY): 12 min  Problem List:  Patient Active Problem List   Diagnosis Date Noted  . Confusion 07/10/2018  . Chronic pain 07/10/2018  . Closed fracture of distal end of right humerus with routine healing 01/04/2018  . Multiple rib fractures 05/09/2016  . Fall 05/09/2016  . Leukocytosis 05/09/2016  . Chest pain 03/13/2015  . Other specified fever   . Pain in the chest   . Abdominal pain 10/28/2014  . Anemia 10/28/2014  . Hypokalemia 10/28/2014  . Protein-calorie malnutrition, severe ([place]) 10/28/2014  . Macrocytic anemia 08/13/2014  . Chronic diastolic CHF (congestive heart failure) ([place]) 08/13/2014  . PUD (peptic ulcer disease) 10/05/2013  . Acute gastric ulcer due to Helicobacter pylori 02/72/5366  . Right kidney stone 12/31/2012  . Hypotension 12/31/2012  . Pancreatic atrophy 12/31/2012   Past Medical History:  Past Medical History:  Diagnosis Date  . Anemia   . Arthritis   . Cancer ([place])    uterine surg only  . CHF (congestive heart failure) ([place])   . Colitis 12/31/2012   presumed C.Diff. patient declined colonoscopy  . Hypotension   . Kidney stone   . Muscle weakness (generalized)   . Osteoarthritis   . Pancreatic atrophy 12/31/2012  . Pneumonia    Past Surgical History:  Past Surgical History:  Procedure Laterality Date  . ABDOMINAL HYSTERECTOMY    . CHOLECYSTECTOMY    . ESOPHAGOGASTRODUODENOSCOPY N/A 03/03/2013   RMR: Significant gastric ulcer without bleeding stigmata requiring no endoscopic intervention today. Noncritical Schatzki's ring. Small hiatal hernia  . ESOPHAGOGASTRODUODENOSCOPY N/A 08/24/2013   Dr. [name]: Hiatal hernia.  Gastric mucosal scar formation at site of previously noted gastric ulcerations  . JOINT REPLACEMENT    . LEFT KNEE SURGERY AND REVISION    . RIGHT  HIP SURGERY     HPI:  85 y.o. female with a history of diastolic heart failure, uterine cancer status post hysterectomy, arthritis, chronic pain management.  Patient seen for confusion and slurred speech. CT of the head shows chronic ischemic and atrophic changes, no acute findings. Pt refused MRI.   Assessment / Plan / Recommendation Clinical Impression  Family was not present to provide information of baseline speech-cognitive abilities of pt with hearing loss (moderate). She exhibited dysarthria decreasing intelligibility in phrases-sentences marked by distortions. Cognitive impairments evident in basic problem solving, intellectual awareness and suspect anticipatory awareness although assessment limited by pt's alertness and participation. Comprehension of basic information and use of expressive language was functional for simple commands and discourse with SLP. Pt lives alone with caregivers assist although not 24 hour supervision (per PT). She reports she is responsible for taking her medications and fincances. Recommend continued assessment of cognitive abilities in diagnostic therapy.        SLP Assessment  SLP Recommendation/Assessment: Patient needs continued Speech Lanaguage Pathology Services SLP Visit Diagnosis: Cognitive communication deficit (R41.841)    Follow Up Recommendations  Skilled Nursing facility    Frequency and Duration min 2x/week  2 weeks      SLP Evaluation Cognition  Overall Cognitive Status: No family/caregiver present to determine baseline cognitive functioning Arousal/Alertness: Awake/alert Orientation Level: Disoriented to situation;Oriented to person;Oriented to place Attention: Sustained Sustained Attention: Impaired Sustained Attention Impairment: Verbal basic Memory: (TBA) Awareness: Impaired Awareness Impairment: Anticipatory impairment;Emergent impairment;Intellectual impairment Problem Solving: (to be assessed further) Safety/Judgment: Impaired  Comprehension  Auditory Comprehension Overall Auditory Comprehension: Appears within functional limits for tasks assessed Commands: Within Functional Limits(for one step) Visual Recognition/Discrimination Discrimination: Not tested Reading Comprehension Reading Status: Not tested    Expression Expression Primary Mode of Expression: Verbal Verbal Expression Overall Verbal Expression: Appears within functional limits for tasks assessed Initiation: No impairment Level of Generative/Spontaneous Verbalization: Sentence Repetition: (NT) Naming: Not tested Pragmatics: No impairment Written Expression Written Expression: Not tested   Oral / Motor  Oral Motor/Sensory Function Overall Oral Motor/Sensory Function: Within functional limits Motor Speech Overall Motor Speech: Impaired Respiration: Within functional limits Phonation: Normal Resonance: Within functional limits Articulation: Within functional limitis Intelligibility: Intelligibility reduced Word: 75-100% accurate Phrase: 50-74% accurate Motor Planning: Witnin functional limits   GO                    Houston Siren 07/13/2018, 2:35 PM   Orbie Pyo Jossue Rubenstein M.Ed Risk analyst [phone] Office [phone]

## 2018-07-13 NOTE — Progress Notes (Addendum)
PROGRESS NOTE    [name]  PJA:[id number] DOB: 1918/09/22 DOA: 07/10/2018 PCP: Celene Squibb, MD   Brief Narrative: 85 year old with past medical history significant for diastolic heart failure, uterine cancer status post hysterectomy, chronic pain due to arthritis.  Patient presented with confusion and slurred speech that is started the night prior to admission.  Patient had received her evening dose of oxycodone and then had DNR.  Caregiver was away for approximately an hour an hour when she came back patient was very confused, disoriented and had a slurry speech.  After that patient went to bed and she wake up with some improvement of her symptoms.  She was brought for further evaluation.   Assessment & Plan:   Principal Problem:   Confusion Active Problems:   PUD (peptic ulcer disease)   Chronic diastolic CHF (congestive heart failure) (HCC)   Chronic pain   1-Confusion; difficulty finding words, ? TIA -No evidence of infection: No leukocytosis,  Afebrile, UA without evidence of infection.  Alcohol level less than 10.  UDS negative.  X-ray no acute abnormalities. -CT head: No acute abnormality chronic ischemic changes. -Patient refuse MRI. Discussed with Daughter, will start plavix and repeat CT head which was negative.  -LDL; 98.  Hemoglobin A1c; 5.5 TSH: 0.831, B12 204 low normal. Received two dose IM B 12. Oral B 12 supplement ordered.   2-chronic diastolic heart failure Compensated.  Not  on Lasix at home.  3-Chronic pain; continue with oxycodone PRN. Reduce dose.  Notice UDS was negative for opioid.  Follow up with PCP, pain medication dose was decrease. Also notice UDS was negative for opioid.  Recommend close follow up./   4-Poor oral intake; hypoglycemia Start IV fluids. D 5.  Ensure.   5-Bradycardia; Asymptomatic.  TSH normal.  Check electrolytes.  HR is lower whle patient is sleeping.  Replete electrolytes. IV KCL. Oral bicarb tablet. IV magnesium.    Hypokalemia; replete orally. Replete IV    Peptic ulcer disease; treated with PPI        Estimated body mass index is 15.24 kg/m as calculated from the following:   Height as of this encounter: 5' (1.524 m).   Weight as of this encounter: 35.4 kg.   DVT prophylaxis: Lovenox Code Status: Full code Family Communication:  Disposition Plan: Home when improved  Consultants:   None   Procedures:   Echo   Antimicrobials:   None   Subjective: Patient is more alert today. She denies dizziness, chest pain or dyspnea.  She was oriented to person and place today.   Objective: Vitals:   07/13/18 0418 07/13/18 0812 07/13/18 0815 07/13/18 1223  BP: (!) 162/96 (!) 147/50  (!) 139/56  Pulse: (!) 51 (!) 48 (!) 56 (!) 45  Resp:  17  17  Temp: 98.3 F (36.8 C) 98.1 F (36.7 C)  98.1 F (36.7 C)  TempSrc: Oral Oral  Oral  SpO2: 98% 97%  96%  Weight:      Height:        Intake/Output Summary (Last 24 hours) at 07/13/2018 1308 Last data filed at 07/13/2018 0831 Gross per 24 hour  Intake -  Output 100 ml  Net -100 ml   Filed Weights   07/10/18 1354 07/10/18 2000  Weight: 32.7 kg 35.4 kg    Examination:  General exam: NAD, thin  Respiratory system: CTA Cardiovascular system: S 1, S 2 RRR, bradycardia Gastrointestinal system: BS present, soft, nt Central nervous system: alert, follows  some command.  Extremities: symmetric power.   Data Reviewed: I have personally reviewed following labs and imaging studies  CBC: Recent Labs  Lab 07/10/18 1422  WBC 6.3  NEUTROABS 4.1  HGB 12.3  HCT 38.6  MCV 105.2*  PLT 381   Basic Metabolic Panel: Recent Labs  Lab 07/10/18 1422 07/10/18 1440 07/12/18 0915  NA 142  --  140  K 3.8  --  3.3*  CL 106  --  105  CO2 28  --  21*  GLUCOSE 108*  --  96  BUN 17  --  14  CREATININE 0.67 0.60 0.64  CALCIUM 9.0  --  9.6   GFR: Estimated Creatinine Clearance: 22.5 mL/min (by C-G formula based on SCr of 0.64  mg/dL). Liver Function Tests: Recent Labs  Lab 07/10/18 1422  AST 25  ALT 14  ALKPHOS 57  BILITOT 0.8  PROT 7.0  ALBUMIN 4.4   No results for input(s): LIPASE, AMYLASE in the last 168 hours. No results for input(s): AMMONIA in the last 168 hours. Coagulation Profile: Recent Labs  Lab 07/10/18 1422  INR 1.0   Cardiac Enzymes: No results for input(s): CKTOTAL, CKMB, CKMBINDEX, TROPONINI in the last 168 hours. BNP (last 3 results) No results for input(s): PROBNP in the last 8760 hours. HbA1C: Recent Labs    07/11/18 0429  HGBA1C 5.5   CBG: Recent Labs  Lab 07/10/18 1353  GLUCAP 95   Lipid Profile: Recent Labs    07/11/18 0429  CHOL 178  HDL 67  LDLCALC 98  TRIG 65  CHOLHDL 2.7   Thyroid Function Tests: Recent Labs    07/11/18 0932  TSH 0.831   Anemia Panel: Recent Labs    07/11/18 0932  VITAMINB12 204   Sepsis Labs: No results for input(s): PROCALCITON, LATICACIDVEN in the last 168 hours.  Recent Results (from the past 240 hour(s))  Urine Culture     Status: None   Collection Time: 07/10/18  1:58 PM  Result Value Ref Range Status   Specimen Description   Final    URINE, CLEAN CATCH Performed at [facility], [address], [place], [place] [address]    Special Requests   Final    NONE Performed at [facility], [address] [place], [place] [address]    Culture   Final    NO GROWTH Performed at [facility], [place] [address], [place], [place] [address]    Report Status 07/12/2018 FINAL  Final         Radiology Studies: Ct Head Wo Contrast  Result Date: 07/12/2018 CLINICAL DATA:  Confusion.  Altered mental status EXAM: CT HEAD WITHOUT CONTRAST TECHNIQUE: Contiguous axial images were obtained from the base of the skull through the vertex without intravenous contrast. COMPARISON:  Head CT 07/10/2018 FINDINGS: Brain: No acute intracranial hemorrhage. No focal mass lesion. No CT evidence of acute infarction. No midline shift or mass  effect. No hydrocephalus. Basilar cisterns are patent. There are periventricular and subcortical white matter hypodensities. Generalized cortical atrophy. Marked atrophy along the temporal lobes. Extensive periventricular white matter hypodensities. Enlarged ventricles. Findings are all stable from comparison exam. Vascular: No hyperdense vessel or unexpected calcification. Skull: Normal. Negative for fracture or focal lesion. Sinuses/Orbits: Paranasal sinuses and mastoid air cells are clear. Orbits are clear. Other: None. IMPRESSION: 1. Atrophy and white matter microvascular disease not changed from prior. 2. No acute findings. Electronically Signed   By: Suzy Bouchard M.D.   On:  07/12/2018 10:40        Scheduled Meds: . clopidogrel  75 mg Oral Daily  . diclofenac sodium  2 g Topical QID  . docusate sodium  100 mg Oral QPM  . enoxaparin (LOVENOX) injection  30 mg Subcutaneous Q24H  . feeding supplement (ENSURE ENLIVE)  237 mL Oral BID BM  . pantoprazole  40 mg Oral q morning - 10a  . polyethylene glycol  17 g Oral Daily  . vitamin B-12  100 mcg Oral Daily   Continuous Infusions: . sodium chloride 100 mL/hr at 07/13/18 0800     LOS: 0 days    Time spent: 35 minutes.     Elmarie Shiley, MD [facility]  07/13/2018, 1:08 PM

## 2018-07-14 DIAGNOSIS — G894 Chronic pain syndrome: Secondary | ICD-10-CM | POA: Diagnosis not present

## 2018-07-14 DIAGNOSIS — I495 Sick sinus syndrome: Secondary | ICD-10-CM

## 2018-07-14 DIAGNOSIS — R410 Disorientation, unspecified: Secondary | ICD-10-CM | POA: Diagnosis not present

## 2018-07-14 DIAGNOSIS — G459 Transient cerebral ischemic attack, unspecified: Secondary | ICD-10-CM | POA: Diagnosis not present

## 2018-07-14 DIAGNOSIS — I5032 Chronic diastolic (congestive) heart failure: Secondary | ICD-10-CM | POA: Diagnosis not present

## 2018-07-14 LAB — GLUCOSE, CAPILLARY: Glucose-Capillary: 94 mg/dL (ref 70–99)

## 2018-07-14 LAB — BASIC METABOLIC PANEL
Anion gap: 5 (ref 5–15)
BUN: 11 mg/dL (ref 8–23)
CO2: 24 mmol/L (ref 22–32)
Calcium: 8.2 mg/dL — ABNORMAL LOW (ref 8.9–10.3)
Chloride: 110 mmol/L (ref 98–111)
Creatinine, Ser: 0.63 mg/dL (ref 0.44–1.00)
GFR calc Af Amer: >60 mL/min (ref >60)
GFR calc non Af Amer: >60 mL/min (ref >60)
Glucose, Bld: 120 mg/dL — ABNORMAL HIGH (ref 70–99)
Potassium: 3.5 mmol/L (ref 3.5–5.1)
SODIUM: 139 mmol/L (ref 135–145)

## 2018-07-14 NOTE — Consult Note (Addendum)
Cardiology Consultation:   Patient ID: SHALANA JARDIN MRN: [id number]; DOB: [birth year]  Admit date: 07/10/2018 Date of Consult: 07/14/2018  Primary Care Provider: Celene Squibb, MD Primary Cardiologist: No primary care provider on file.  Primary Electrophysiologist:  None     Patient Profile:   VINIA JEMMOTT is a 85 y.o. female with a hx of uterine ca treated surgically, chronic diastolic CHF, and arthritis on pain management  who is being seen today for the evaluation of ? Tachy-brady at the request of Dr. Tyrell Antonio.  History of Present Illness:   Ms. Sienkiewicz was admitted 07/10/2018 with confusion, disorientation.  During her hospitalization and w/u for this she was observed to have bradycardia on telemetry, EP is asked to evaluate.WU to date has not revealed stroke, (pt declined MRI), started empirically on Plavix, no evidence of infection or clear source for confusion  LABS K+ 3.3 > 3.2 > 3.5 BUN/Creat 11/0.63 Mag 1.8 TSH 0.831 WBC 6.3 H/H 12/38 plts 219  BP stable, not on any rate limiting/nodal blocking agents at home or in-patient  The patient is very hard of hearing, and difficult to communicate well though she denies history of ever fainting, no dizzy spells or CP.  Does not seem to have any cardiac awareness. The patient extremely hard of hearing, AAO to self only initially.  When re-oriented to the fact that she is in the hospital she tells me because she has had a stroke.  She mentions she is frustrated that she is having trouble with remembering names    Past Medical History:  Diagnosis Date  . Anemia   . Arthritis   . Cancer ([facility])    uterine surg only  . CHF (congestive heart failure) ([place])   . Colitis 12/31/2012   presumed C.Diff. patient declined colonoscopy  . Hypotension   . Kidney stone   . Muscle weakness (generalized)   . Osteoarthritis   . Pancreatic atrophy 12/31/2012  . Pneumonia     Past Surgical History:  Procedure Laterality Date  .  ABDOMINAL HYSTERECTOMY    . CHOLECYSTECTOMY    . ESOPHAGOGASTRODUODENOSCOPY N/A 03/03/2013   RMR: Significant gastric ulcer without bleeding stigmata requiring no endoscopic intervention today. Noncritical Schatzki's ring. Small hiatal hernia  . ESOPHAGOGASTRODUODENOSCOPY N/A 08/24/2013   Dr. [name]: Hiatal hernia.  Gastric mucosal scar formation at site of previously noted gastric ulcerations  . JOINT REPLACEMENT    . LEFT KNEE SURGERY AND REVISION    . RIGHT HIP SURGERY       Home Medications:  Prior to Admission medications   Medication Sig Start Date End Date Taking? Authorizing Provider  acetaminophen (TYLENOL) 500 MG tablet Take 500-1,000 mg by mouth every 6 (six) hours as needed for mild pain, moderate pain, fever or headache.    Yes [provider]  albuterol (PROAIR HFA) 108 (90 BASE) MCG/ACT inhaler Inhale 1-2 puffs into the lungs every 6 (six) hours as needed for wheezing or shortness of breath.   Yes [provider]  ALPRAZolam (XANAX) 0.25 MG tablet Take 0.25 mg by mouth daily as needed for anxiety.  06/10/16  Yes [provider]  alum & mag hydroxide-simeth (MAALOX REGULAR STRENGTH) 397-673-41 MG/5ML suspension Take 15 mLs by mouth every 6 (six) hours as needed for indigestion or heartburn. 08/16/16  Yes Kinnie Feil, PA-C  diclofenac sodium (VOLTAREN) 1 % GEL Apply 2 g topically 4 (four) times daily.  08/18/17  Yes [provider]  docusate sodium (COLACE) 100  MG capsule Take 100 mg by mouth every evening.    Yes [provider]  pantoprazole (PROTONIX) 40 MG tablet Take 40 mg by mouth every morning.    Yes [provider]  polyethylene glycol (MIRALAX / GLYCOLAX) packet Take 17 g by mouth daily.    Yes [provider]  clopidogrel (PLAVIX) 75 MG tablet Take 1 tablet (75 mg total) by mouth daily. 07/13/18   Regalado, Belkys A, MD  oxyCODONE-acetaminophen (PERCOCET) 10-325 MG tablet Take 0.5 tablets by mouth every 6  (six) hours as needed for pain. 07/12/18   Regalado, Belkys A, MD  vitamin B-12 100 MCG tablet Take 1 tablet (100 mcg total) by mouth daily. 07/13/18   Regalado, Cassie Freer, MD    Inpatient Medications: Scheduled Meds: . clopidogrel  75 mg Oral Daily  . diclofenac sodium  2 g Topical QID  . docusate sodium  100 mg Oral QPM  . enoxaparin (LOVENOX) injection  30 mg Subcutaneous Q24H  . feeding supplement  1 Container Oral TID BM  . pantoprazole  40 mg Oral q morning - 10a  . polyethylene glycol  17 g Oral Daily  . sodium bicarbonate  1,300 mg Oral BID  . vitamin B-12  100 mcg Oral Daily   Continuous Infusions: . dextrose 5 % and 0.9% NaCl 100 mL/hr at 07/14/18 0553   PRN Meds: acetaminophen **OR** acetaminophen (TYLENOL) oral liquid 160 mg/5 mL **OR** acetaminophen, albuterol, ALPRAZolam, oxyCODONE-acetaminophen **AND** [DISCONTINUED] oxyCODONE, senna-docusate  Allergies:    Allergies  Allergen Reactions  . Sulfa Antibiotics Other (See Comments)    Reaction:  Unknown   . Aspirin Other (See Comments)    Reaction:  Burning of pts stomach     Social History:   Social History   Socioeconomic History  . Marital status: Widowed    Spouse name: Not on file  . Number of children: Not on file  . Years of education: Not on file  . Highest education level: Not on file  Occupational History  . Not on file  Social Needs  . Financial resource strain: Not on file  . Food insecurity:    Worry: Not on file    Inability: Not on file  . Transportation needs:    Medical: Not on file    Non-medical: Not on file  Tobacco Use  . Smoking status: Former Smoker    Types: Cigarettes    Last attempt to quit: 04/25/1951    Years since quitting: 67.2  . Smokeless tobacco: Never Used  Substance and Sexual Activity  . Alcohol use: No    Alcohol/week: 1.0 standard drinks    Types: 1 Glasses of wine per week  . Drug use: No  . Sexual activity: Never  Lifestyle  . Physical activity:    Days per  week: Not on file    Minutes per session: Not on file  . Stress: Not on file  Relationships  . Social connections:    Talks on phone: Not on file    Gets together: Not on file    Attends religious service: Not on file    Active member of club or organization: Not on file    Attends meetings of clubs or organizations: Not on file    Relationship status: Not on file  . Intimate partner violence:    Fear of current or ex partner: Not on file    Emotionally abused: Not on file    Physically abused: Not on file  Forced sexual activity: Not on file  Other Topics Concern  . Not on file  Social History Narrative  . Not on file    Family History:   Family History  Problem Relation Age of Onset  . Cancer Mother      ROS:  Please see the history of present illness.  All other ROS reviewed and negative.     Physical Exam/Data:   Vitals:   07/14/18 0025 07/14/18 0400 07/14/18 0809 07/14/18 1217  BP: (!) 134/51 (!) 122/50 138/63 (!) 153/66  Pulse: (!) 46  (!) 57 (!) 55  Resp: (!) 22 18 18 18   Temp: 98 F (36.7 C) 97.8 F (36.6 C) 98.4 F (36.9 C) 98.6 F (37 C)  TempSrc: Oral Oral Oral Oral  SpO2: 97% 96% 99% 96%  Weight:      Height:        Intake/Output Summary (Last 24 hours) at 07/14/2018 1400 Last data filed at 07/14/2018 1100 Gross per 24 hour  Intake 240 ml  Output 1000 ml  Net -760 ml   Last 3 Weights 07/10/2018 07/10/2018 03/11/2018  Weight (lbs) 78 lb 0.7 oz 72 lb 1.5 oz 72 lb  Weight (kg) 35.4 kg 32.7 kg 32.659 kg     Body mass index is 15.24 kg/m.  General:  She is very thin, frail body habitus, in no acute distress, resting comfortablly HEENT: normal Lymph: no adenopathy Neck: no JVD Endocrine:  No thryomegaly Vascular: No carotid bruits Cardiac:  RRR; bradycardic, no murmurs, gallops or rubs are appreciated Lungs:  CTA b/l, no wheezing, rhonchi or rales  Abd: soft, nontender Ext: no edema Musculoskeletal:  No deformities, BUE and BLE strength normal  and equal Skin: warm and dry  Neuro:  CNs 2-12 intact, no focal abnormalities noted Psych:  Normal affect   EKG:  The EKG was personally reviewed and demonstrates:   SR 68bpm, 1st degree AVblock, 249ms Telemetry:  Telemetry was personally reviewed and demonstrates:   SR on admission, she developed periods of bradycardia with heart block (difficult with baseline artifact though suspect CHB) with V rates transiently high 20's > V pause/asystole of 12 seconds (x1), this had occurred early evening, has had brady into the 30's at noon as well.  Unknown if patient was sleeping.  She has had what looks an ATach as well, no clear Afib, 130's, and of late generally has maintained SB 40's-50's   Relevant CV Studies:  07/11/2018: TTE IMPRESSIONS  1. The left ventricle has normal systolic function with an ejection fraction of 60-65%. The cavity size was normal. Left ventricular diastolic Doppler parameters are indeterminate.  2. The right ventricle has normal systolic function. The cavity was normal. There is no increase in right ventricular wall thickness.  3. Left atrial size was moderately dilated.  4. The mitral valve is normal in structure. Mild thickening of the mitral valve leaflet. There is mild mitral annular calcification present.  5. The tricuspid valve is normal in structure.  6. The aortic valve is tricuspid Mild thickening of the aortic valve.  7. The pulmonic valve was normal in structure.  8. Normal LV function; moderate LAE; mild MR and TR. Laboratory Data:  Chemistry Recent Labs  Lab 07/12/18 0915 07/13/18 1301 07/14/18 0721  NA 140 139 139  K 3.3* 3.2* 3.5  CL 105 107 110  CO2 21* 17* 24  GLUCOSE 96 65* 120*  BUN 14 21 11   CREATININE 0.64 0.76 0.63  CALCIUM 9.6 8.8* 8.2*  GFRNONAA >60 >60 >60  GFRAA >60 >60 >60  ANIONGAP 14 15 5     Recent Labs  Lab 07/10/18 1422  PROT 7.0  ALBUMIN 4.4  AST 25  ALT 14  ALKPHOS 57  BILITOT 0.8   Hematology Recent Labs  Lab  07/10/18 1422  WBC 6.3  RBC 3.67*  HGB 12.3  HCT 38.6  MCV 105.2*  MCH 33.5  MCHC 31.9  RDW 13.4  PLT 219   Cardiac EnzymesNo results for input(s): TROPONINI in the last 168 hours. No results for input(s): TROPIPOC in the last 168 hours.  BNPNo results for input(s): BNP, PROBNP in the last 168 hours.  DDimer No results for input(s): DDIMER in the last 168 hours.  Radiology/Studies:   Dg Chest 2 View Result Date: 07/10/2018 CLINICAL DATA:  Altered mental status EXAM: CHEST - 2 VIEW COMPARISON:  03/11/2018 FINDINGS: Cardiac shadow is stable. Stable scoliosis concave to the left is noted. No focal infiltrate or effusion is seen. No acute bony abnormality is noted. IMPRESSION: No acute abnormality noted. Electronically Signed   By: Inez Catalina M.D.   On: 07/10/2018 15:10    Ct Head Wo Contrast Result Date: 07/12/2018 CLINICAL DATA:  Confusion.  Altered mental status EXAM: CT HEAD WITHOUT CONTRAST TECHNIQUE: Contiguous axial images were obtained from the base of the skull through the vertex without intravenous contrast. COMPARISON:  Head CT 07/10/2018 FINDINGS: Brain: No acute intracranial hemorrhage. No focal mass lesion. No CT evidence of acute infarction. No midline shift or mass effect. No hydrocephalus. Basilar cisterns are patent. There are periventricular and subcortical white matter hypodensities. Generalized cortical atrophy. Marked atrophy along the temporal lobes. Extensive periventricular white matter hypodensities. Enlarged ventricles. Findings are all stable from comparison exam. Vascular: No hyperdense vessel or unexpected calcification. Skull: Normal. Negative for fracture or focal lesion. Sinuses/Orbits: Paranasal sinuses and mastoid air cells are clear. Orbits are clear. Other: None. IMPRESSION: 1. Atrophy and white matter microvascular disease not changed from prior. 2. No acute findings. Electronically Signed   By: Suzy Bouchard M.D.   On: 07/12/2018 10:40    Ct Head Wo  Contrast Result Date: 07/10/2018 CLINICAL DATA:  Slurred speech EXAM: CT HEAD WITHOUT CONTRAST TECHNIQUE: Contiguous axial images were obtained from the base of the skull through the vertex without intravenous contrast. COMPARISON:  02/26/2017 FINDINGS: Brain: Chronic atrophic changes are identified. Chronic white matter ischemic changes are again noted and stable. No findings to suggest acute hemorrhage, acute infarction or space-occupying mass lesion are seen. Basal ganglia calcifications are noted on the left. Vascular: No hyperdense vessel or unexpected calcification. Skull: Normal. Negative for fracture or focal lesion. Sinuses/Orbits: No acute finding. Other: None. IMPRESSION: Chronic atrophic and ischemic changes without acute abnormality. Electronically Signed   By: Inez Catalina M.D.   On: 07/10/2018 15:14    Assessment and Plan:   1. Tachy-brady     (I do not see evidence of afib)  Unclear if asleep with periods of high degree AVblock pauses/asystole, she also has Atach that are brief and self limited.  Though she was sleeping when we enter the room  Tele is reviewed at length with Dr. Curt Bears.  She appears to have P-P prolongation with the brady events that would suggest this was vagally mediated.  At this time would not recommend pacing unless he became clearly symptomatic, occurring at times of being awake.      For questions or updates, please contact [place] Please consult [email or web address] for contact  info under     Signed, Baldwin Jamaica, PA-C  07/14/2018 2:00 PM  I have seen and examined this patient with Tommye Standard.  Agree with above, note added to reflect my findings.  On exam, RRR, no murmurs, lungs clear. Patient with tachy/brady syndrome. Has up to 10 second pauses with episodes of tachycardia. During pause events, the patient had P-P and PR interval prolongation which are likely vagal in nature. if she is asymptomatic from the pauses, would work to avoid pacemaker  implant. She also seems asymptomatic from tachycardia. Would avoid AV nodal blocking agents.    Sanjeev Main M. Waverly Chavarria MD 07/14/2018 4:15 PM

## 2018-07-14 NOTE — Progress Notes (Signed)
CSW following for discharge plan. CSW paged PT earlier today for updated information being needed; they will see as soon as able. CSW discussed plans with patient's daughter, [name]; preferences are for [facility] or [facility]. After PT was updated, CSW sent out referral to facilities and left a voicemail for [facility]. [facility] is able to accept the patient, they have initiated insurance authorization with [employer].   CSW to follow.  Laveda Abbe, [place] Clinical Social Worker [phone]

## 2018-07-14 NOTE — Progress Notes (Signed)
Occupational Therapy Treatment Patient Details Name: [name] MRN: [id number] DOB: 04/09/1919 Today's Date: 07/14/2018    History of present illness 85 y.o. female with a history of diastolic heart failure, uterine cancer status post hysterectomy, arthritis, chronic pain management.  Patient seen for confusion and slurred speech. CT of the head shows chronic ischemic and atrophic changes, no acute findings.   OT comments  Patient progressing slowly.  Continues to present with mild word finding difficulties, cognition difficult to assess due to hard of hearing but able to follow 1 step commands, engage appropriately with therapist. She completes bed mobility, basic transfers and short distance functional mobility with RW min assist level.  Increased time and effort for all tasks.  Grooming at EOB to brush teeth with min assist.  DC plan remains appropriate.  Will follow.     Follow Up Recommendations  SNF    Equipment Recommendations  Other (comment)(TBD at next venue of care)    Recommendations for Other Services      Precautions / Restrictions Precautions Precautions: Fall Restrictions Weight Bearing Restrictions: No       Mobility Bed Mobility Overal bed mobility: Needs Assistance Bed Mobility: Supine to Sit     Supine to sit: Min assist;HOB elevated     General bed mobility comments: increased time and effort, requires assist to ascend trunk  Transfers Overall transfer level: Needs assistance Equipment used: Rolling walker (2 wheeled) Transfers: Sit to/from Stand Sit to Stand: Min assist         General transfer comment: Assist to power to standing, safety and balance     Balance Overall balance assessment: Needs assistance Sitting-balance support: Feet supported;No upper extremity supported Sitting balance-Leahy Scale: Fair     Standing balance support: During functional activity Standing balance-Leahy Scale: Poor Standing balance comment: reliant  on B UE support                            ADL either performed or assessed with clinical judgement   ADL Overall ADL's : Needs assistance/impaired     Grooming: Oral care;Minimal assistance;Sitting Grooming Details (indicate cue type and reason): assist to manage toothpaste and manage items, increased pain                  Toilet Transfer: Minimal assistance;Ambulation;RW Toilet Transfer Details (indicate cue type and reason): simulated to recliner          Functional mobility during ADLs: Minimal assistance;Rolling walker General ADL Comments: min assist with RW, generalized weakness and decreased act tolerance       Vision       Perception     Praxis      Cognition Arousal/Alertness: Awake/alert Behavior During Therapy: WFL for tasks assessed/performed Overall Cognitive Status: No family/caregiver present to determine baseline cognitive functioning                                 General Comments: Oriented to name, able to follow commands, very hard of hearing therefore difficult to assess.  Some word finding difficulties.         Exercises     Shoulder Instructions       General Comments      Pertinent Vitals/ Pain       Pain Assessment: Faces Faces Pain Scale: Hurts little more Pain Location: generalized  Pain Descriptors / Indicators: Aching  Pain Intervention(s): Monitored during session;Repositioned  Home Living                                          Prior Functioning/Environment              Frequency  Min 2X/week        Progress Toward Goals  OT Goals(current goals can now be found in the care plan section)  Progress towards OT goals: Progressing toward goals  Acute Rehab OT Goals Patient Stated Goal: "get moving again" OT Goal Formulation: With patient Time For Goal Achievement: 07/25/18 Potential to Achieve Goals: Good  Plan Discharge plan remains appropriate;Frequency  remains appropriate    Co-evaluation                 AM-PAC OT "6 Clicks" Daily Activity     Outcome Measure   Help from another person eating meals?: A Little Help from another person taking care of personal grooming?: A Lot Help from another person toileting, which includes using toliet, bedpan, or urinal?: A Lot Help from another person bathing (including washing, rinsing, drying)?: A Lot Help from another person to put on and taking off regular upper body clothing?: A Lot Help from another person to put on and taking off regular lower body clothing?: A Lot 6 Click Score: 13    End of Session Equipment Utilized During Treatment: Rolling walker  OT Visit Diagnosis: Unsteadiness on feet (R26.81);Other abnormalities of gait and mobility (R26.89);Muscle weakness (generalized) (M62.81);Pain;Cognitive communication deficit (R41.841);Other symptoms and signs involving cognitive function   Activity Tolerance Patient tolerated treatment well   Patient Left in chair;with call bell/phone within reach;with chair alarm set   Nurse Communication Mobility status        Time: 1324-4010 OT Time Calculation (min): 26 min  Charges: OT General Charges $OT Visit: 1 Visit OT Treatments $Self Care/Home Management : 23-37 mins  Delight Stare, [place] Pager [phone] Office [phone]    Delight Stare 07/14/2018, 5:24 PM

## 2018-07-14 NOTE — Progress Notes (Signed)
PROGRESS NOTE   [name]  BWL:[id number]    DOB: 10/04/1918    DOA: 07/10/2018  PCP: Celene Squibb, MD   I have briefly reviewed patients previous medical records in [facility].  Brief Narrative:  85 year old female, lives home alone and is cared for by a caregiver, with PMH of chronic diastolic CHF, uterine cancer status post hysterectomy, arthritis, chronic pain, presented to ED due to confusion, slurred speech that started evening prior to admission which reportedly started 1 hour to hour and a half after her evening dose of oxycodone.  She was admitted for acute encephalopathy and suspected TIA.   Assessment & Plan:   Principal Problem:   Confusion Active Problems:   PUD (peptic ulcer disease)   Chronic diastolic CHF (congestive heart failure) (HCC)   Chronic pain   1. Acute confusion/acute encephalopathy: Unclear etiology.  Reportedly had confusion, slurred speech and difficulty finding words.  No clinical suspicion for infectious etiology.  UA negative.  BAL <10.  UDS negative.  CT head without acute abnormality and showed chronic ischemic changes.  Patient refused MRI.  Prior TRH MD discussed with daughter, started patient on Plavix and repeated CT head which was negative.  LDL 98, A1c 5.5, TSH: 0.831, B12: 204.  She received 2 doses of IM B12 followed by oral B12 supplementation.  Mental status seems to have improved but need to discuss with family regarding how close she is to baseline.  Her acute confusional status may also have been related to opioids and extremely hard of hearing. 2. Possible TIA: Management as indicated above. 3. Chronic diastolic CHF: Compensated and not on diuretics at home. 4. Chronic pain: Mental status changes may have been due to opioids and hence dose reduced.  UDS negative follow-up with PCP. 5. Poor oral intake/hypoglycemia: Encourage oral intake.  Monitor CBGs.  Reduce IV D5 infusion to [facility] and monitor closely. 6. Hypokalemia: Replaced.   Magnesium 1.8. 7. History of PUD: Continue PPI. 8. Tachybradycardia: Noted on telemetry.  Likely asymptomatic.  EP cardiology input appreciated and I discussed with Dr. Curt Bears who indicates that patient has tacky/bradycardia syndrome, has up to 10-second pauses with episodes of tachycardia, suspect asymptomatic and recommends avoiding pacemaker and AV nodal blocking agents.  He will reassess in a.m. 9. Scoliosis  10. B12 deficiency: Replace and outpatient follow-up.   DVT prophylaxis: Lovenox Code Status: Full Family Communication: None at bedside Disposition: SNF pending clinical improvement and bed availability.   Consultants:  EP cardiology  Procedures:  None  Antimicrobials:  None   Subjective: Patient extremely hard of hearing.  Oriented to self.  Denies complaints.  As per RN, mental status seems to be better, sat up and ate breakfast.  Reportedly has 24/7 home care.  RN reports that family are interested in pursuing SNF.  ROS: As above, otherwise negative  Objective:  Vitals:   07/14/18 0400 07/14/18 0809 07/14/18 1217 07/14/18 1639  BP: (!) 122/50 138/63 (!) 153/66 138/75  Pulse:  (!) 57 (!) 55 (!) 55  Resp: 18 18 18 18   Temp: 97.8 F (36.6 C) 98.4 F (36.9 C) 98.6 F (37 C) 98.8 F (37.1 C)  TempSrc: Oral Oral Oral Oral  SpO2: 96% 99% 96% 98%  Weight:      Height:        Examination:  General exam: Pleasant very elderly female, small built and frail, sitting up comfortably in bed without distress. Respiratory system: Clear to auscultation. Respiratory effort normal.  Scoliosis +. Cardiovascular system: S1 & S2 heard, RRR. No JVD, murmurs, rubs, gallops or clicks. No pedal edema.  Telemetry personally reviewed: Mostly bradycardic in the 40s-50s, occasionally in the 30s and has periodic tachycardia up to 120s.  3.35-second pause noted 3/1 Gastrointestinal system: Abdomen is nondistended, soft and nontender. No organomegaly or masses felt. Normal bowel sounds  heard. Central nervous system: Alert and oriented to self.  No facial asymmetry or dysarthria.  No focal deficits appreciated. Extremities: Moves all extremities symmetrically. Skin: No rashes, lesions or ulcers Psychiatry: Judgement and insight impaired. Mood & affect pleasant and appropriate.     Data Reviewed: I have personally reviewed following labs and imaging studies  CBC: Recent Labs  Lab 07/10/18 1422  WBC 6.3  NEUTROABS 4.1  HGB 12.3  HCT 38.6  MCV 105.2*  PLT 517   Basic Metabolic Panel: Recent Labs  Lab 07/10/18 1422 07/10/18 1440 07/12/18 0915 07/13/18 1301 07/14/18 0721  NA 142  --  140 139 139  K 3.8  --  3.3* 3.2* 3.5  CL 106  --  105 107 110  CO2 28  --  21* 17* 24  GLUCOSE 108*  --  96 65* 120*  BUN 17  --  14 21 11   CREATININE 0.67 0.60 0.64 0.76 0.63  CALCIUM 9.0  --  9.6 8.8* 8.2*  MG  --   --   --  1.8  --   PHOS  --   --   --  3.1  --    Liver Function Tests: Recent Labs  Lab 07/10/18 1422  AST 25  ALT 14  ALKPHOS 57  BILITOT 0.8  PROT 7.0  ALBUMIN 4.4   Coagulation Profile: Recent Labs  Lab 07/10/18 1422  INR 1.0   Cardiac Enzymes: No results for input(s): CKTOTAL, CKMB, CKMBINDEX, TROPONINI in the last 168 hours. HbA1C: No results for input(s): HGBA1C in the last 72 hours. CBG: Recent Labs  Lab 07/10/18 1353  GLUCAP 95    Recent Results (from the past 240 hour(s))  Urine Culture     Status: None   Collection Time: 07/10/18  1:58 PM  Result Value Ref Range Status   Specimen Description   Final    URINE, CLEAN CATCH Performed at [facility], [address], [place], [place] [address]    Special Requests   Final    NONE Performed at [facility], [address] [place], [place] [address]    Culture   Final    NO GROWTH Performed at [facility], [place] [address], [place], [place] [address]    Report Status 07/12/2018 FINAL  Final         Radiology Studies: No results found.      Scheduled  Meds: . clopidogrel  75 mg Oral Daily  . diclofenac sodium  2 g Topical QID  . docusate sodium  100 mg Oral QPM  . enoxaparin (LOVENOX) injection  30 mg Subcutaneous Q24H  . feeding supplement  1 Container Oral TID BM  . pantoprazole  40 mg Oral q morning - 10a  . polyethylene glycol  17 g Oral Daily  . sodium bicarbonate  1,300 mg Oral BID  . vitamin B-12  100 mcg Oral Daily   Continuous Infusions: . dextrose 5 % and 0.9% NaCl 100 mL/hr at 07/14/18 0553     LOS: 0 days     Vernell Leep, MD, FACP, [facility]. [facility]  To contact the attending  provider between 7A-7P or the covering provider during after hours 7P-7A, please log into the web site [email or web address] and access using universal [place] password for that web site. If you do not have the password, please call the hospital operator.  07/14/2018, 5:41 PM

## 2018-07-15 ENCOUNTER — Inpatient Hospital Stay
Admission: RE | Admit: 2018-07-15 | Discharge: 2019-01-07 | Disposition: A | Discharge status: Skilled Nursing Facility | Payer: Medicare HMO | Source: Ambulatory Visit | Attending: Internal Medicine | Admitting: Internal Medicine

## 2018-07-15 DIAGNOSIS — E870 Hyperosmolality and hypernatremia: Secondary | ICD-10-CM | POA: Diagnosis not present

## 2018-07-15 DIAGNOSIS — E46 Unspecified protein-calorie malnutrition: Secondary | ICD-10-CM | POA: Diagnosis not present

## 2018-07-15 DIAGNOSIS — K5909 Other constipation: Secondary | ICD-10-CM | POA: Diagnosis not present

## 2018-07-15 DIAGNOSIS — R279 Unspecified lack of coordination: Secondary | ICD-10-CM | POA: Diagnosis not present

## 2018-07-15 DIAGNOSIS — G9340 Encephalopathy, unspecified: Secondary | ICD-10-CM | POA: Diagnosis not present

## 2018-07-15 DIAGNOSIS — I5032 Chronic diastolic (congestive) heart failure: Secondary | ICD-10-CM

## 2018-07-15 DIAGNOSIS — R531 Weakness: Secondary | ICD-10-CM | POA: Diagnosis not present

## 2018-07-15 DIAGNOSIS — G894 Chronic pain syndrome: Secondary | ICD-10-CM | POA: Diagnosis not present

## 2018-07-15 DIAGNOSIS — R0902 Hypoxemia: Secondary | ICD-10-CM | POA: Diagnosis not present

## 2018-07-15 DIAGNOSIS — M159 Polyosteoarthritis, unspecified: Secondary | ICD-10-CM | POA: Diagnosis not present

## 2018-07-15 DIAGNOSIS — R4182 Altered mental status, unspecified: Secondary | ICD-10-CM | POA: Diagnosis not present

## 2018-07-15 DIAGNOSIS — M6281 Muscle weakness (generalized): Secondary | ICD-10-CM | POA: Diagnosis not present

## 2018-07-15 DIAGNOSIS — M2550 Pain in unspecified joint: Secondary | ICD-10-CM | POA: Diagnosis not present

## 2018-07-15 DIAGNOSIS — Z7401 Bed confinement status: Secondary | ICD-10-CM | POA: Diagnosis not present

## 2018-07-15 DIAGNOSIS — R69 Illness, unspecified: Secondary | ICD-10-CM | POA: Diagnosis not present

## 2018-07-15 DIAGNOSIS — W19XXXA Unspecified fall, initial encounter: Secondary | ICD-10-CM | POA: Diagnosis not present

## 2018-07-15 DIAGNOSIS — G459 Transient cerebral ischemic attack, unspecified: Secondary | ICD-10-CM | POA: Diagnosis not present

## 2018-07-15 DIAGNOSIS — I495 Sick sinus syndrome: Secondary | ICD-10-CM | POA: Diagnosis not present

## 2018-07-15 DIAGNOSIS — R410 Disorientation, unspecified: Secondary | ICD-10-CM | POA: Diagnosis not present

## 2018-07-15 DIAGNOSIS — G8929 Other chronic pain: Secondary | ICD-10-CM | POA: Diagnosis not present

## 2018-07-15 DIAGNOSIS — E43 Unspecified severe protein-calorie malnutrition: Secondary | ICD-10-CM | POA: Diagnosis not present

## 2018-07-15 DIAGNOSIS — D649 Anemia, unspecified: Secondary | ICD-10-CM | POA: Diagnosis not present

## 2018-07-15 DIAGNOSIS — K279 Peptic ulcer, site unspecified, unspecified as acute or chronic, without hemorrhage or perforation: Secondary | ICD-10-CM | POA: Diagnosis not present

## 2018-07-15 DIAGNOSIS — F0390 Unspecified dementia without behavioral disturbance: Secondary | ICD-10-CM | POA: Diagnosis not present

## 2018-07-15 DIAGNOSIS — R627 Adult failure to thrive: Secondary | ICD-10-CM | POA: Diagnosis not present

## 2018-07-15 DIAGNOSIS — R52 Pain, unspecified: Secondary | ICD-10-CM | POA: Diagnosis not present

## 2018-07-15 LAB — GLUCOSE, CAPILLARY
GLUCOSE-CAPILLARY: 71 mg/dL (ref 70–99)
Glucose-Capillary: 90 mg/dL (ref 70–99)
Glucose-Capillary: 83 mg/dL (ref 70–99)
Glucose-Capillary: 79 mg/dL (ref 70–99)

## 2018-07-15 MED ORDER — ALPRAZOLAM 0.25 MG PO TABS: 0.2500 mg | ORAL_TABLET | Freq: Every day | ORAL | 0 refills | Status: DC | PRN

## 2018-07-15 NOTE — Progress Notes (Signed)
Discharge to: [facility] Anticipated discharge date: 07/15/18 Family notified: Murvin Natal, by phone Transportation by: [facility]  Report #: [phone], Room 109  CSW signing off.  Laveda Abbe LCSW [phone]

## 2018-07-15 NOTE — Progress Notes (Addendum)
Progress Note  Patient Name: [name] Date of Encounter: 07/15/2018  Primary Cardiologist: No primary care provider on file.   Subjective   Not much of an appetite, though n complaints otherwise  Inpatient Medications    Scheduled Meds: . clopidogrel  75 mg Oral Daily  . diclofenac sodium  2 g Topical QID  . docusate sodium  100 mg Oral QPM  . enoxaparin (LOVENOX) injection  30 mg Subcutaneous Q24H  . feeding supplement  1 Container Oral TID BM  . pantoprazole  40 mg Oral q morning - 10a  . polyethylene glycol  17 g Oral Daily  . sodium bicarbonate  1,300 mg Oral BID  . vitamin B-12  100 mcg Oral Daily   Continuous Infusions: . dextrose 5 % and 0.9% NaCl 100 mL/hr at 07/14/18 0553   PRN Meds: acetaminophen **OR** acetaminophen (TYLENOL) oral liquid 160 mg/5 mL **OR** acetaminophen, albuterol, ALPRAZolam, oxyCODONE-acetaminophen **AND** [DISCONTINUED] oxyCODONE, senna-docusate   Vital Signs    Vitals:   07/14/18 1933 07/14/18 2342 07/15/18 0339 07/15/18 0741  BP: 133/71 130/64 133/61 140/65  Pulse: (!) 59 86 81 (!) 52  Resp: 18 18 18 18   Temp: 98.4 F (36.9 C) 98.6 F (37 C) 98.3 F (36.8 C) 98.2 F (36.8 C)  TempSrc: Oral Oral Oral Oral  SpO2: 98% 96% 98% 97%  Weight:      Height:        Intake/Output Summary (Last 24 hours) at 07/15/2018 0844 Last data filed at 07/14/2018 1813 Gross per 24 hour  Intake 240 ml  Output 400 ml  Net -160 ml   Last 3 Weights 07/10/2018 07/10/2018 03/11/2018  Weight (lbs) 78 lb 0.7 oz 72 lb 1.5 oz 72 lb  Weight (kg) 35.4 kg 32.7 kg 32.659 kg      Telemetry    SB/SR nocturnal high 40's, daytime 50's-60's - Personally Reviewed  ECG    No new EKGs - Personally Reviewed  Physical Exam   GEN: No acute distress.   Neck: No JVD Cardiac: RRR, no murmurs, rubs, or gallops.  Respiratory: CTA b/l. GI: Soft, nontender, non-distended  MS: No edema; No deformity. Neuro:  Nonfocal  Psych: Normal affect   Labs      Chemistry Recent Labs  Lab 07/10/18 1422  07/12/18 0915 07/13/18 1301 07/14/18 0721  NA 142  --  140 139 139  K 3.8  --  3.3* 3.2* 3.5  CL 106  --  105 107 110  CO2 28  --  21* 17* 24  GLUCOSE 108*  --  96 65* 120*  BUN 17  --  14 21 11   CREATININE 0.67   < > 0.64 0.76 0.63  CALCIUM 9.0  --  9.6 8.8* 8.2*  PROT 7.0  --   --   --   --   ALBUMIN 4.4  --   --   --   --   AST 25  --   --   --   --   ALT 14  --   --   --   --   ALKPHOS 57  --   --   --   --   BILITOT 0.8  --   --   --   --   GFRNONAA >60  --  >60 >60 >60  GFRAA >60  --  >60 >60 >60  ANIONGAP 8  --  14 15 5    < > = values in this interval  not displayed.     Hematology Recent Labs  Lab 07/10/18 1422  WBC 6.3  RBC 3.67*  HGB 12.3  HCT 38.6  MCV 105.2*  MCH 33.5  MCHC 31.9  RDW 13.4  PLT 219    Cardiac EnzymesNo results for input(s): TROPONINI in the last 168 hours. No results for input(s): TROPIPOC in the last 168 hours.   BNPNo results for input(s): BNP, PROBNP in the last 168 hours.   DDimer No results for input(s): DDIMER in the last 168 hours.   Radiology    No results found.  Cardiac Studies   07/11/2018: TTE IMPRESSIONS 1. The left ventricle has normal systolic function with an ejection fraction of 60-65%. The cavity size was normal. Left ventricular diastolic Doppler parameters are indeterminate. 2. The right ventricle has normal systolic function. The cavity was normal. There is no increase in right ventricular wall thickness. 3. Left atrial size was moderately dilated. 4. The mitral valve is normal in structure. Mild thickening of the mitral valve leaflet. There is mild mitral annular calcification present. 5. The tricuspid valve is normal in structure. 6. The aortic valve is tricuspid Mild thickening of the aortic valve. 7. The pulmonic valve was normal in structure. 8. Normal LV function; moderate LAE; mild MR and TR.  Patient Profile     85 y.o. female  with a hx of  uterine ca treated surgically, chronic diastolic CHF, and arthritis on pain management  admitted with new AMS/confusion, observed to have tachy-brady events on telemetry  Assessment & Plan    1. Tachy-brady     (I do not see evidence of afib)  Brady events/heart block felt to be vagally mediated with P-P, PR prolongation No symptoms bradycardia that I can elicit She seems to be able to hear me better, and again denies h/o syncope, fainting  Would avoid pacing in light of no clear symptomatology associated with either tachy or brady.  Dr. Curt Bears Marajade Lei see later today I do not anticipate need for pacing Avoid nodal blocking drugs      For questions or updates, please contact [facility] Please consult [email or web address] for contact info under        Signed, Baldwin Jamaica, PA-C  07/15/2018, 8:44 AM    I have seen and examined this patient with Tommye Standard.  Agree with above, note added to reflect my findings.  On exam, RRR, no murmurs, lungs clear.  Patient monitored overnight.  Had no further episodes of tachycardia or heart block.  It is likely that her heart block was vagal mediated as she did have P to P and PR interval prolongation.  No pacemaker indication at this time.  We [place] plan to sign off today.  If she has further issues, feel free to call us back.  [facility] Amyx sign off.   Medication Recommendations:  Avoid AV nodal blockers Other recommendations (labs, testing, etc):  none Follow up as an outpatient:  none   Azreal Stthomas M. Thayer Inabinet MD 07/15/2018 2:44 PM

## 2018-07-15 NOTE — Discharge Summary (Addendum)
Physician Discharge Summary  [name] IRC:[id number] DOB: March 24, 1919  PCP: Celene Squibb, MD  Admit date: 07/10/2018 Discharge date: 07/15/2018  Recommendations for Outpatient Follow-up:  1. MD at SNF in 2 to 3 days with repeat labs (CBC & BMP). 2. Dr. Allyn Kenner, PCP upon discharge from SNF. 3. May consider outpatient Cardiology follow-up. 4. Avoid AV nodal blocking agents due to history of tachybradycardia syndrome. 5. Consider palliative care consultation at SNF.   Home Health: Patient being discharged to SNF. Equipment/Devices: TBD at SNF.  Discharge Condition: Improved and stable. CODE STATUS: Full Diet recommendation: Heart healthy diet.  Discharge Diagnoses:  Principal Problem:   Confusion Active Problems:   PUD (peptic ulcer disease)   Chronic diastolic CHF (congestive heart failure) (HCC)   Chronic pain   Brief Summary: 85 year old female, lives home alone and is cared for by a caregiver but probably not for the full 24/7, with PMH of chronic diastolic CHF, uterine cancer status post hysterectomy, arthritis, chronic pain, presented to ED due to confusion, slurred speech that started evening prior to admission which reportedly started 1 hour to hour and a half after her evening dose of oxycodone.  She was admitted for acute encephalopathy and suspected TIA.   Assessment & Plan:    1. Acute confusion/acute encephalopathy: Unclear etiology.  Reportedly had confusion, slurred speech and difficulty finding words.  No clinical suspicion for infectious etiology.  UA negative.  BAL <10.  UDS negative.  CT head without acute abnormality and showed chronic ischemic changes.  Patient refused MRI repeatedly.  Prior TRH MD discussed with daughter, started patient on Plavix (intolerant of aspirin) and repeated CT head which was negative.  LDL 98, A1c 5.5, TSH: 0.831, B12: 204.  She received 2 doses of IM B12 followed by oral B12 supplementation.  Mental status has improved and  may be close to her baseline.  Her acute confusional status may also have been related to opioids and extremely hard of hearing.  Patient has barely used any opioids in the hospital and has not complained of pain and hence opioids will be discontinued at discharge.  She may continue acetaminophen for pain. 2. Possible TIA: Management as indicated above.  TTE: LVEF 60-65%.  No carotid Dopplers were done because she would not be a candidate for any kind of aggressive intervention. 3. Chronic diastolic CHF: Compensated and not on diuretics at home. 4. Chronic pain: Mental status changes may have been due to opioids.  UDS negative.  Currently pain controlled without much opioids at all. 5. Poor oral intake/hypoglycemia: Encourage oral intake.  Monitor CBGs periodically.  No further hypoglycemic episodes. 6. Hypokalemia: Replaced.  Magnesium 1.8. 7. History of PUD: Continue PPI. 8. Tachybradycardia: Noted on telemetry.  Likely asymptomatic.  EP cardiology input appreciated and I discussed with Dr. Curt Bears who indicates that patient has tacky/bradycardia syndrome, has up to 10-second pauses with episodes of tachycardia, suspect asymptomatic and recommends avoiding pacemaker and AV nodal blocking agents.  EP cardiology follow-up appreciated: Bradycardia events/heart block felt to be vagally mediated.  No symptoms of bradycardia elicitable.  Patient denied history of syncope or fainting.  Continued recommendations of avoiding pacemaker in light of no clear symptomatology associated with either tachycardia or bradycardia. 9. Scoliosis  10. B12 deficiency: Replace and outpatient follow-up. 11. Adult failure to thrive: Discussed in detail with patient's daughter and advised her that she will be going to the SNF only for short-term rehab and they may need to start thinking about  returning home with 24/7 supervision and care and if that is not possible then long-term care.  She verbalized  understanding. 12. Microscopic hematuria: Noted on urine microscopy in the absence of UTI symptoms.  May consider outpatient evaluation depending on how aggressive family wish to be.     Consultants:  EP cardiology  Procedures:  None   Discharge Instructions  Discharge Instructions    Call MD for:   Complete by:  As directed    Recurrent strokelike symptoms, confusion or altered mental status.   Call MD for:  difficulty breathing, headache or visual disturbances   Complete by:  As directed    Call MD for:  extreme fatigue   Complete by:  As directed    Call MD for:  persistant dizziness or light-headedness   Complete by:  As directed    Call MD for:  persistant nausea and vomiting   Complete by:  As directed    Call MD for:  severe uncontrolled pain   Complete by:  As directed    Call MD for:  temperature >100.4   Complete by:  As directed    Diet - low sodium heart healthy   Complete by:  As directed    Increase activity slowly   Complete by:  As directed        Medication List    STOP taking these medications   oxyCODONE-acetaminophen 10-325 MG tablet Commonly known as:  PERCOCET     TAKE these medications   acetaminophen 500 MG tablet Commonly known as:  TYLENOL Take 500-1,000 mg by mouth every 6 (six) hours as needed for mild pain, moderate pain, fever or headache.   ALPRAZolam 0.25 MG tablet Commonly known as:  XANAX Take 1 tablet (0.25 mg total) by mouth daily as needed for anxiety.   alum & mag hydroxide-simeth 200-200-20 MG/5ML suspension Commonly known as:  Maalox Regular Strength Take 15 mLs by mouth every 6 (six) hours as needed for indigestion or heartburn.   clopidogrel 75 MG tablet Commonly known as:  PLAVIX Take 1 tablet (75 mg total) by mouth daily.   cyanocobalamin 100 MCG tablet Take 1 tablet (100 mcg total) by mouth daily.   diclofenac sodium 1 % Gel Commonly known as:  VOLTAREN Apply 2 g topically 4 (four) times daily.    docusate sodium 100 MG capsule Commonly known as:  COLACE Take 100 mg by mouth every evening.   pantoprazole 40 MG tablet Commonly known as:  PROTONIX Take 40 mg by mouth every morning.   polyethylene glycol packet Commonly known as:  MIRALAX / GLYCOLAX Take 17 g by mouth daily.   ProAir HFA 108 (90 Base) MCG/ACT inhaler Generic drug:  albuterol Inhale 1-2 puffs into the lungs every 6 (six) hours as needed for wheezing or shortness of breath.      Follow-up Information    MD at SNF Follow up.   Why:  To be seen in 2 to 3 days with repeat labs (CBC & BMP).       Celene Squibb, MD. Schedule an appointment as soon as possible for a visit.   Specialty:  Internal Medicine Why:  Upon discharge from SNF. Contact information: [place] [address] [phone]          Allergies  Allergen Reactions  . Sulfa Antibiotics Other (See Comments)    Reaction:  Unknown   . Aspirin Other (See Comments)    Reaction:  Burning of  pts stomach       Procedures/Studies: Dg Chest 2 View  Result Date: 07/10/2018 CLINICAL DATA:  Altered mental status EXAM: CHEST - 2 VIEW COMPARISON:  03/11/2018 FINDINGS: Cardiac shadow is stable. Stable scoliosis concave to the left is noted. No focal infiltrate or effusion is seen. No acute bony abnormality is noted. IMPRESSION: No acute abnormality noted. Electronically Signed   By: Inez Catalina M.D.   On: 07/10/2018 15:10   Ct Head Wo Contrast  Result Date: 07/12/2018 CLINICAL DATA:  Confusion.  Altered mental status EXAM: CT HEAD WITHOUT CONTRAST TECHNIQUE: Contiguous axial images were obtained from the base of the skull through the vertex without intravenous contrast. COMPARISON:  Head CT 07/10/2018 FINDINGS: Brain: No acute intracranial hemorrhage. No focal mass lesion. No CT evidence of acute infarction. No midline shift or mass effect. No hydrocephalus. Basilar cisterns are patent. There are periventricular and subcortical white  matter hypodensities. Generalized cortical atrophy. Marked atrophy along the temporal lobes. Extensive periventricular white matter hypodensities. Enlarged ventricles. Findings are all stable from comparison exam. Vascular: No hyperdense vessel or unexpected calcification. Skull: Normal. Negative for fracture or focal lesion. Sinuses/Orbits: Paranasal sinuses and mastoid air cells are clear. Orbits are clear. Other: None. IMPRESSION: 1. Atrophy and white matter microvascular disease not changed from prior. 2. No acute findings. Electronically Signed   By: Suzy Bouchard M.D.   On: 07/12/2018 10:40   Ct Head Wo Contrast  Result Date: 07/10/2018 CLINICAL DATA:  Slurred speech EXAM: CT HEAD WITHOUT CONTRAST TECHNIQUE: Contiguous axial images were obtained from the base of the skull through the vertex without intravenous contrast. COMPARISON:  02/26/2017 FINDINGS: Brain: Chronic atrophic changes are identified. Chronic white matter ischemic changes are again noted and stable. No findings to suggest acute hemorrhage, acute infarction or space-occupying mass lesion are seen. Basal ganglia calcifications are noted on the left. Vascular: No hyperdense vessel or unexpected calcification. Skull: Normal. Negative for fracture or focal lesion. Sinuses/Orbits: No acute finding. Other: None. IMPRESSION: Chronic atrophic and ischemic changes without acute abnormality. Electronically Signed   By: Inez Catalina M.D.   On: 07/10/2018 15:14      Subjective: Patient hard of hearing.  Denies complaints.  Wanted to go to the bathroom this morning and needed assistance.  No pain reported.  As per RN, mental status significantly improved compared to 3 to 4 days ago.  No other acute issues reported.  Discharge Exam:  Vitals:   07/14/18 2342 07/15/18 0339 07/15/18 0741 07/15/18 1141  BP: 130/64 133/61 140/65 127/83  Pulse: 86 81 (!) 52 (!) 55  Resp: 18 18 18 18   Temp: 98.6 F (37 C) 98.3 F (36.8 C) 98.2 F (36.8 C) 98  F (36.7 C)  TempSrc: Oral Oral Oral Oral  SpO2: 96% 98% 97% 100%  Weight:      Height:        General exam: Pleasant very elderly female, small built and frail, sitting up comfortably in bed without distress. Respiratory system: Clear to auscultation. Respiratory effort normal.  Scoliosis +. Cardiovascular system: S1 & S2 heard, RRR. No JVD, murmurs, rubs, gallops or clicks. No pedal edema.  Telemetry personally reviewed: Mostly bradycardic in the 40s-50s.  No significant pauses noted over the last 24 hours. Gastrointestinal system: Abdomen is nondistended, soft and nontender. No organomegaly or masses felt. Normal bowel sounds heard. Central nervous system: Alert and oriented to self and place.  No facial asymmetry or dysarthria.  No focal deficits appreciated. Extremities: Moves  all extremities symmetrically. Skin: No rashes, lesions or ulcers Psychiatry: Judgement and insight impaired. Mood & affect pleasant and appropriate.     The results of significant diagnostics from this hospitalization (including imaging, microbiology, ancillary and laboratory) are listed below for reference.     Microbiology: Recent Results (from the past 240 hour(s))  Urine Culture     Status: None   Collection Time: 07/10/18  1:58 PM  Result Value Ref Range Status   Specimen Description   Final    URINE, CLEAN CATCH Performed at [facility], [address], [place], [place] [address]    Special Requests   Final    NONE Performed at [facility], [address] [place], [place] [address]    Culture   Final    NO GROWTH Performed at [facility], [place] [address], [place],  [address]    Report Status 07/12/2018 FINAL  Final     Labs: CBC: Recent Labs  Lab 07/10/18 1422  WBC 6.3  NEUTROABS 4.1  HGB 12.3  HCT 38.6  MCV 105.2*  PLT 425   Basic Metabolic Panel: Recent Labs  Lab 07/10/18 1422 07/10/18 1440 07/12/18 0915 07/13/18 1301 07/14/18 0721  NA 142  --  140 139  139  K 3.8  --  3.3* 3.2* 3.5  CL 106  --  105 107 110  CO2 28  --  21* 17* 24  GLUCOSE 108*  --  96 65* 120*  BUN 17  --  14 21 11   CREATININE 0.67 0.60 0.64 0.76 0.63  CALCIUM 9.0  --  9.6 8.8* 8.2*  MG  --   --   --  1.8  --   PHOS  --   --   --  3.1  --    Liver Function Tests: Recent Labs  Lab 07/10/18 1422  AST 25  ALT 14  ALKPHOS 57  BILITOT 0.8  PROT 7.0  ALBUMIN 4.4   CBG: Recent Labs  Lab 07/10/18 1353 07/14/18 2113 07/15/18 0804 07/15/18 1152 07/15/18 1620  GLUCAP 95 94 90 83 71   Urinalysis    Component Value Date/Time   COLORURINE YELLOW 07/10/2018 [place] 07/10/2018 1358   LABSPEC 1.012 07/10/2018 1358   PHURINE 7.0 07/10/2018 1358   GLUCOSEU NEGATIVE 07/10/2018 1358   HGBUR MODERATE (A) 07/10/2018 1358   BILIRUBINUR NEGATIVE 07/10/2018 [place] 07/10/2018 1358   PROTEINUR NEGATIVE 07/10/2018 1358   UROBILINOGEN 0.2 03/13/2015 0120   NITRITE NEGATIVE 07/10/2018 1358   LEUKOCYTESUR NEGATIVE 07/10/2018 1358   I discussed in detail with patient's daughter, updated care and answered questions.  Patient had initially been declined by her insurance company for SNF admission.  I did a peer to peer discussion with Dr. Eloise Levels, provided her with patient details and she approved her for SNF for STR.   Time coordinating discharge: 40 minutes  SIGNED:  Vernell Leep, MD, FACP, [facility]. [facility]  To contact the attending provider between 7A-7P or the covering provider during after hours 7P-7A, please log into the web site [email or web address] and access using universal [place] password for that web site. If you do not have the password, please call the hospital operator.

## 2018-07-15 NOTE — Progress Notes (Signed)
CSW following for discharge plan. CSW received call from [facility] has issued a denial for SNF placement, offering a peer to peer review if MD is interested. MD willing to complete peer to peer review. CSW contacted [employer] to schedule and provide information to complete peer to peer review (Reference # [id number]).   CSW alerted [facility] that peer to peer review will be completed. Awaiting results of peer to peer to determine disposition.  Laveda Abbe, [place] Clinical Social Worker [phone]

## 2018-07-15 NOTE — Progress Notes (Signed)
Patient discharging to [facility]  Nurse call report

## 2018-07-15 NOTE — Discharge Instructions (Signed)

## 2018-07-16 ENCOUNTER — Encounter: Payer: Self-pay | Admitting: Adult Health

## 2018-07-16 ENCOUNTER — Non-Acute Institutional Stay (SKILLED_NURSING_FACILITY): Payer: Medicare HMO | Admitting: Adult Health

## 2018-07-16 ENCOUNTER — Other Ambulatory Visit: Payer: Self-pay | Admitting: Adult Health

## 2018-07-16 DIAGNOSIS — I5032 Chronic diastolic (congestive) heart failure: Secondary | ICD-10-CM

## 2018-07-16 DIAGNOSIS — K5909 Other constipation: Secondary | ICD-10-CM

## 2018-07-16 DIAGNOSIS — G894 Chronic pain syndrome: Secondary | ICD-10-CM | POA: Diagnosis not present

## 2018-07-16 DIAGNOSIS — I495 Sick sinus syndrome: Secondary | ICD-10-CM

## 2018-07-16 DIAGNOSIS — E43 Unspecified severe protein-calorie malnutrition: Secondary | ICD-10-CM | POA: Diagnosis not present

## 2018-07-16 DIAGNOSIS — K279 Peptic ulcer, site unspecified, unspecified as acute or chronic, without hemorrhage or perforation: Secondary | ICD-10-CM

## 2018-07-16 DIAGNOSIS — F411 Generalized anxiety disorder: Secondary | ICD-10-CM

## 2018-07-16 DIAGNOSIS — R69 Illness, unspecified: Secondary | ICD-10-CM | POA: Diagnosis not present

## 2018-07-16 MED ORDER — ALPRAZOLAM 0.25 MG PO TABS: 0.2500 mg | ORAL_TABLET | Freq: Every day | ORAL | 0 refills | Status: DC | PRN

## 2018-07-16 NOTE — Progress Notes (Signed)
Location:   [place] Room Number: 109 D Place of Service:  SNF (31)   CODE STATUS: Full Code  Allergies  Allergen Reactions  . Sulfa Antibiotics Other (See Comments)    Reaction:  Unknown   . Aspirin Other (See Comments)    Reaction:  Burning of pts stomach     Chief Complaint  Patient presents with  . Hospitalization Follow-up    Hospital follow up    HPI:  She is a 85 year old who has been hospitalized for acute confusion encephalopathy with unclear etiology. She has B12 deficiency for which she has received 2 IM injections; now on oral supplement. Her diastolic heart failure is compensated. She is here for short term rehab with her goal to return back home. There are no reports of uncontrolled pain; no changes in appetite; no reports of anxiety or agitation. She does have a sitter at bedside. She will continue to be followed for her chronic illnesses including: chronic diastolic heart failure; pud; chronic constipation   Past Medical History:  Diagnosis Date  . Anemia   . Arthritis   . Cancer ([place])    uterine surg only  . CHF (congestive heart failure) ([place])   . Colitis 12/31/2012   presumed C.Diff. patient declined colonoscopy  . Hypotension   . Kidney stone   . Muscle weakness (generalized)   . Osteoarthritis   . Pancreatic atrophy 12/31/2012  . Pneumonia     Past Surgical History:  Procedure Laterality Date  . ABDOMINAL HYSTERECTOMY    . CHOLECYSTECTOMY    . ESOPHAGOGASTRODUODENOSCOPY N/A 03/03/2013   RMR: Significant gastric ulcer without bleeding stigmata requiring no endoscopic intervention today. Noncritical Schatzki's ring. Small hiatal hernia  . ESOPHAGOGASTRODUODENOSCOPY N/A 08/24/2013   Dr. [name]: Hiatal hernia.  Gastric mucosal scar formation at site of previously noted gastric ulcerations  . JOINT REPLACEMENT    . LEFT KNEE SURGERY AND REVISION    . RIGHT HIP SURGERY      Social History   Socioeconomic History  .  Marital status: Widowed    Spouse name: Not on file  . Number of children: Not on file  . Years of education: Not on file  . Highest education level: Not on file  Occupational History  . Not on file  Social Needs  . Financial resource strain: Not on file  . Food insecurity:    Worry: Not on file    Inability: Not on file  . Transportation needs:    Medical: Not on file    Non-medical: Not on file  Tobacco Use  . Smoking status: Former Smoker    Types: Cigarettes    Last attempt to quit: 04/25/1951    Years since quitting: 67.2  . Smokeless tobacco: Never Used  Substance and Sexual Activity  . Alcohol use: No    Alcohol/week: 1.0 standard drinks    Types: 1 Glasses of wine per week  . Drug use: No  . Sexual activity: Never  Lifestyle  . Physical activity:    Days per week: Not on file    Minutes per session: Not on file  . Stress: Not on file  Relationships  . Social connections:    Talks on phone: Not on file    Gets together: Not on file    Attends religious service: Not on file    Active member of club or organization: Not on file    Attends meetings of clubs or organizations:  Not on file    Relationship status: Not on file  . Intimate partner violence:    Fear of current or ex partner: Not on file    Emotionally abused: Not on file    Physically abused: Not on file    Forced sexual activity: Not on file  Other Topics Concern  . Not on file  Social History Narrative  . Not on file   Family History  Problem Relation Age of Onset  . Cancer Mother       VITAL SIGNS BP (!) 118/47   Pulse (!) 59   Temp 98 F (36.7 C)   Resp 20   Ht 5' (1.524 m)   Wt 71 lb 12.8 oz (32.6 kg)   BMI 14.02 kg/m   Outpatient Encounter Medications as of 07/16/2018  Medication Sig  . acetaminophen (TYLENOL) 500 MG tablet Take 500 mg by mouth every 6 (six) hours as needed for mild pain, moderate pain, fever or headache.   . albuterol (PROAIR HFA) 108 (90 BASE) MCG/ACT inhaler  Inhale 2 puffs into the lungs every 6 (six) hours as needed for wheezing or shortness of breath.   . ALPRAZolam (XANAX) 0.25 MG tablet Take 1 tablet (0.25 mg total) by mouth daily as needed for anxiety.  Marland Kitchen alum & mag hydroxide-simeth (MAALOX REGULAR STRENGTH) 200-200-20 MG/5ML suspension Take 15 mLs by mouth every 6 (six) hours as needed for indigestion or heartburn.  . clopidogrel (PLAVIX) 75 MG tablet Take 1 tablet (75 mg total) by mouth daily.  . diclofenac sodium (VOLTAREN) 1 % GEL Apply 2 g topically 4 (four) times daily.   Marland Kitchen docusate sodium (COLACE) 100 MG capsule Take 100 mg by mouth daily.   . NON FORMULARY Diet Type:  NAS  . pantoprazole (PROTONIX) 40 MG tablet Take 40 mg by mouth every morning.   . polyethylene glycol (MIRALAX / GLYCOLAX) packet Take 17 g by mouth daily.   . vitamin B-12 100 MCG tablet Take 1 tablet (100 mcg total) by mouth daily.   No facility-administered encounter medications on file as of 07/16/2018.      SIGNIFICANT DIAGNOSTIC EXAMS  TODAY:   07-10-18: chest x-ray: No acute abnormality noted.  07-10-18: ct of head: Chronic atrophic and ischemic changes without acute abnormality.  07-11-18: 2-d echo:  1. The left ventricle has normal systolic function with an ejection fraction of 60-65%. The cavity size was normal. Left ventricular diastolic Doppler parameters are indeterminate.  2. The right ventricle has normal systolic function. The cavity was normal. There is no increase in right ventricular wall thickness.  3. Left atrial size was moderately dilated.  4. The mitral valve is normal in structure. Mild thickening of the mitral valve leaflet. There is mild mitral annular calcification present.  5. The tricuspid valve is normal in structure.  6. The aortic valve is tricuspid Mild thickening of the aortic valve.  7. The pulmonic valve was normal in structure.  8. Normal LV function; moderate LAE; mild MR and TR.  07-12-18: ct of head: 1. Atrophy and white matter  microvascular disease not changed from prior. 2. No acute findings.  LABS REVIEWED TODAY:   07-10-18: wbc 6.3; hgb 12.3; hct 38.6; mcv 105.2; plt 219; glucose 108; bun 17 creat 0.67; k+ 3.8; na++ 142; ca 9.0 liver normal albumin 4.4 urine culture: no growth urine drug screen neg 07-11-18: chol 178; ldl 98  trig 65 hdl 67; tsh 0.831; hgb a1c 5.5 bit B 12 204  07-13-18: glucose 65; bun 21; creat 0.76; k+ 3.2; an++ 139; ca 8.8 mag 1.8; phos 3.1 07-14-18: glucose 120; bun 11; creat 0.63; k+ 3.5; na++ 139    Review of Systems  Reason unable to perform ROS: confusion: does hurt everywhere    Physical Exam Constitutional:      General: She is not in acute distress.    Appearance: She is well-developed. She is not diaphoretic.     Comments: Frail   Neck:     Musculoskeletal: Neck supple.     Thyroid: No thyromegaly.  Cardiovascular:     Rate and Rhythm: Normal rate and regular rhythm.     Pulses: Normal pulses.     Heart sounds: Normal heart sounds.  Pulmonary:     Effort: Pulmonary effort is normal. No respiratory distress.     Breath sounds: Normal breath sounds.  Abdominal:     General: Bowel sounds are normal. There is no distension.     Palpations: Abdomen is soft.     Tenderness: There is no abdominal tenderness.  Musculoskeletal:     Right lower leg: No edema.     Left lower leg: No edema.     Comments: Is able to move all extremities Right leg shorter wears built up heel.   Lymphadenopathy:     Cervical: No cervical adenopathy.  Skin:    General: Skin is warm and dry.     Comments: Bilateral lower extremities with bruising present R>L  Neurological:     Mental Status: She is alert. Mental status is at baseline.  Psychiatric:        Mood and Affect: Mood normal.      ASSESSMENT/ PLAN:  TODAY:   1. PUD: is stable will continue protonix 40 mg daily   2. Chronic diastolic heart failure: is presently compensated will monitor  3. Tachy/bradycardia syndrome: is  asymptomatic cardiology recommends avoiding a pace maker will monitor is on plavix 75 mg daily   4. Chronic constipation: is stable will continue colace daily and miralax daily   5. Protein-calorie malnutrition, severe: is stable weight is 71 pounds; will continue supplements as indicated  6. Chronic pain syndrome: pain is presently managed with voltaren gel 2 gm four times daily   7. Generalized anxiety disorder: is stable will continue xanax 0.25 mg daily as needed for 14 days.   8. Vit B 12 deficiency: is stable will continue supplement as directed   Will check cbc bmp     MD is aware of resident's narcotic use and is in agreement with current plan of care. We will attempt to wean resident as apropriate   Ok Edwards NP [facility]  Contact [phone] Monday through Friday 8am- 5pm  After hours call [phone]

## 2018-07-18 DIAGNOSIS — I495 Sick sinus syndrome: Secondary | ICD-10-CM | POA: Insufficient documentation

## 2018-07-18 DIAGNOSIS — F411 Generalized anxiety disorder: Secondary | ICD-10-CM | POA: Insufficient documentation

## 2018-07-19 ENCOUNTER — Non-Acute Institutional Stay (SKILLED_NURSING_FACILITY): Payer: Medicare HMO | Admitting: Internal Medicine

## 2018-07-19 ENCOUNTER — Encounter (HOSPITAL_COMMUNITY)
Admission: RE | Admit: 2018-07-19 | Discharge: 2018-07-19 | Disposition: A | Discharge status: Home / Self Care | Payer: Medicare HMO | Source: Skilled Nursing Facility | Attending: Adult Health | Admitting: Adult Health

## 2018-07-19 ENCOUNTER — Encounter: Payer: Self-pay | Admitting: Internal Medicine

## 2018-07-19 DIAGNOSIS — G9340 Encephalopathy, unspecified: Secondary | ICD-10-CM

## 2018-07-19 DIAGNOSIS — R627 Adult failure to thrive: Secondary | ICD-10-CM

## 2018-07-19 DIAGNOSIS — G894 Chronic pain syndrome: Secondary | ICD-10-CM | POA: Diagnosis not present

## 2018-07-19 DIAGNOSIS — W19XXXA Unspecified fall, initial encounter: Secondary | ICD-10-CM

## 2018-07-19 DIAGNOSIS — I495 Sick sinus syndrome: Secondary | ICD-10-CM

## 2018-07-19 DIAGNOSIS — I5032 Chronic diastolic (congestive) heart failure: Secondary | ICD-10-CM

## 2018-07-19 LAB — BASIC METABOLIC PANEL
Anion gap: 11 (ref 5–15)
BUN: 19 mg/dL (ref 8–23)
CHLORIDE: 102 mmol/L (ref 98–111)
CO2: 26 mmol/L (ref 22–32)
Calcium: 8.4 mg/dL — ABNORMAL LOW (ref 8.9–10.3)
Creatinine, Ser: 0.53 mg/dL (ref 0.44–1.00)
GFR calc Af Amer: >60 mL/min (ref >60)
GFR calc non Af Amer: >60 mL/min (ref >60)
Glucose, Bld: 85 mg/dL (ref 70–99)
Potassium: 2.6 mmol/L — CL (ref 3.5–5.1)
Sodium: 139 mmol/L (ref 135–145)

## 2018-07-19 LAB — CBC
HCT: 32.9 % — ABNORMAL LOW (ref 36.0–46.0)
Hemoglobin: 10.8 g/dL — ABNORMAL LOW (ref 12.0–15.0)
MCH: 32.6 pg (ref 26.0–34.0)
MCHC: 32.8 g/dL (ref 30.0–36.0)
MCV: 99.4 fL (ref 80.0–100.0)
Platelets: 186 10*3/uL (ref 150–400)
RBC: 3.31 MIL/uL — AB (ref 3.87–5.11)
RDW: 13.2 % (ref 11.5–15.5)
WBC: 10.5 10*3/uL (ref 4.0–10.5)
nRBC: 0 % (ref 0.0–0.2)

## 2018-07-19 NOTE — Progress Notes (Signed)
Provider:  Veleta Miners, MD Location:  [place] Room Number: 71 D Place of Service:  SNF (31)  PCP: Celene Squibb, MD Patient Care Team: Celene Squibb, MD as PCP - General (Internal Medicine) [name] Cristopher Estimable, MD as Consulting Physician (Gastroenterology)  Extended Emergency Contact Information Primary Emergency Contact: Schoenrock,Gloria [place] Phone: [phone] Relation: Daughter Secondary Emergency Contact: [place] Phone: [phone] Relation: Daughter  Code Status: Full Code Goals of Care: Advanced Directive information Advanced Directives 07/19/2018  Does Patient Have a Medical Advance Directive? No  Type of Advance Directive -  Does patient want to make changes to medical advance directive? -  Copy of [place] in Chart? -  Would patient like information on creating a medical advance directive? No - Patient declined  Pre-existing out of facility DNR order (yellow form or pink MOST form) -      Chief Complaint  Patient presents with  . New Admit To SNF    Admission    HPI: Patient is a 85 y.o. female seen today for admission to SNF for therapy  Patient was in the hospital from 02/29-03/05 For Acute Encephalopathy and Slurred speech. Patient has PMH of Chronic Diastolic CHF, Uterine cancer s/p Hysterectomy, Arthritis..Fracture of Right humerus , Right Shoulder Rotator Cuff arthropathy. Hard of hearing, Underweight and Failure to thrive  Patient lives by herself with Caregivers some hours of day. She has 2 daughters who live out of town. Caregivers noticed that she was not ambulating as before and was confused. They thought she had a stroke. She was taken to the hospital. Her CT scan twice was negative. She refused MRI.  Her other labs including TSH and B12 were normal.  Her echo showed EF of 60 to 65%.  She is allergic to aspirin so she was started on  Plavix.  Her mental status improved and she is now in SNF for therapy She also had Tachy brady as noticed  on the telemetry. Cardiology recommended to monitor with no Pacemaker for now  Today in  therapy patient did not do well and was hardly able to get out of the wheelchair. I had a long discussion with her daughter and they said that she was walking with a walker at home and had caregivers few hours a day. Patient has lost 5 pounds over past few months.  She has not been eating that well.  Patient only complaint was low back pain.  Did not have any other acute issues.  Denied any fever, cough or shortness of breath.  Past Medical History:  Diagnosis Date  . Anemia   . Arthritis   . Cancer ([place])    uterine surg only  . CHF (congestive heart failure) ([place])   . Colitis 12/31/2012   presumed C.Diff. patient declined colonoscopy  . Hypotension   . Kidney stone   . Muscle weakness (generalized)   . Osteoarthritis   . Pancreatic atrophy 12/31/2012  . Pneumonia    Past Surgical History:  Procedure Laterality Date  . ABDOMINAL HYSTERECTOMY    . CHOLECYSTECTOMY    . ESOPHAGOGASTRODUODENOSCOPY N/A 03/03/2013   RMR: Significant gastric ulcer without bleeding stigmata requiring no endoscopic intervention today. Noncritical Schatzki's ring. Small hiatal hernia  . ESOPHAGOGASTRODUODENOSCOPY N/A 08/24/2013   Dr. [name]: Hiatal hernia.  Gastric mucosal scar formation at site of previously noted gastric ulcerations  . JOINT REPLACEMENT    . LEFT  KNEE SURGERY AND REVISION    . RIGHT HIP SURGERY      reports that she quit smoking about 67 years ago. Her smoking use included cigarettes. She has never used smokeless tobacco. She reports that she does not drink alcohol or use drugs. Social History   Socioeconomic History  . Marital status: Widowed    Spouse name: Not on file  . Number of children: Not on file  . Years of education: Not on file  . Highest education level: Not on file  Occupational  History  . Not on file  Social Needs  . Financial resource strain: Not on file  . Food insecurity:    Worry: Not on file    Inability: Not on file  . Transportation needs:    Medical: Not on file    Non-medical: Not on file  Tobacco Use  . Smoking status: Former Smoker    Types: Cigarettes    Last attempt to quit: 04/25/1951    Years since quitting: 67.2  . Smokeless tobacco: Never Used  Substance and Sexual Activity  . Alcohol use: No    Alcohol/week: 1.0 standard drinks    Types: 1 Glasses of wine per week  . Drug use: No  . Sexual activity: Never  Lifestyle  . Physical activity:    Days per week: Not on file    Minutes per session: Not on file  . Stress: Not on file  Relationships  . Social connections:    Talks on phone: Not on file    Gets together: Not on file    Attends religious service: Not on file    Active member of club or organization: Not on file    Attends meetings of clubs or organizations: Not on file    Relationship status: Not on file  . Intimate partner violence:    Fear of current or ex partner: Not on file    Emotionally abused: Not on file    Physically abused: Not on file    Forced sexual activity: Not on file  Other Topics Concern  . Not on file  Social History Narrative  . Not on file    Functional Status Survey:    Family History  Problem Relation Age of Onset  . Cancer Mother     Health Maintenance  Topic Date Due  . INFLUENZA VACCINE  08/10/2018 (Originally 12/10/2017)  . TETANUS/TDAP  08/16/2018 (Originally 09/15/1937)  . PNA vac Low Risk Adult (1 of 2 - PCV13) 08/16/2018 (Originally 09/14/1985)  . DEXA SCAN  Discontinued    Allergies  Allergen Reactions  . Sulfa Antibiotics Other (See Comments)    Reaction:  Unknown   . Aspirin Other (See Comments)    Reaction:  Burning of pts stomach     Outpatient Encounter Medications as of 07/19/2018  Medication Sig  . acetaminophen (TYLENOL) 500 MG tablet Take 500 mg by mouth every 6  (six) hours as needed for mild pain, moderate pain, fever or headache.   . albuterol (PROAIR HFA) 108 (90 BASE) MCG/ACT inhaler Inhale 2 puffs into the lungs every 6 (six) hours as needed for wheezing or shortness of breath.   . ALPRAZolam (XANAX) 0.25 MG tablet Take 1 tablet (0.25 mg total) by mouth daily as needed for up to 14 days for anxiety.  Marland Kitchen alum & mag hydroxide-simeth (MAALOX REGULAR STRENGTH) 200-200-20 MG/5ML suspension Take 15 mLs by mouth every 6 (six) hours as needed for indigestion or heartburn.  Roseanne Kaufman  [place]-Castor Oil (VENELEX) OINT Apply topically to sacrum and bilateral buttocks every shift and as needed for blanchable erythema  . clopidogrel (PLAVIX) 75 MG tablet Take 1 tablet (75 mg total) by mouth daily.  . diclofenac sodium (VOLTAREN) 1 % GEL Apply 2 g topically 4 (four) times daily.   Marland Kitchen docusate sodium (COLACE) 100 MG capsule Take 100 mg by mouth daily.   . NON FORMULARY Diet Type:  NAS  . pantoprazole (PROTONIX) 40 MG tablet Take 40 mg by mouth every morning.   . polyethylene glycol (MIRALAX / GLYCOLAX) packet Take 17 g by mouth daily.   . Skin Protectants, Misc. (NO-STING SKIN-PREP EX) Apply topically to bilateral heels every shift for prevention  . vitamin B-12 100 MCG tablet Take 1 tablet (100 mcg total) by mouth daily.   No facility-administered encounter medications on file as of 07/19/2018.     Review of Systems  Constitutional: Positive for activity change, appetite change and unexpected weight change.  HENT: Negative.   Respiratory: Negative.   Cardiovascular: Negative.   Gastrointestinal: Negative.   Genitourinary: Negative.   Musculoskeletal: Positive for arthralgias and back pain.  Skin: Negative.   Neurological: Positive for weakness.  Psychiatric/Behavioral: Negative.     Vitals:   07/19/18 1027  Pulse: 82  Resp: (!) 22  Temp: 97.8 F (36.6 C)  Weight: 70 lb 6.4 oz (31.9 kg)  Height: 4\' 5"  (1.346 m)   Body mass index is 17.62  kg/m. Physical Exam Vitals signs reviewed.  HENT:     Head: Normocephalic.     Nose: Nose normal.     Mouth/Throat:     Mouth: Mucous membranes are moist.     Pharynx: Oropharynx is clear.  Eyes:     Pupils: Pupils are equal, round, and reactive to light.  Cardiovascular:     Heart sounds: Normal heart sounds. No murmur.  Pulmonary:     Effort: Pulmonary effort is normal. No respiratory distress.     Breath sounds: Normal breath sounds. No wheezing or rales.  Abdominal:     General: Abdomen is flat. Bowel sounds are normal. There is no distension.     Palpations: Abdomen is soft.     Tenderness: There is no abdominal tenderness.  Musculoskeletal:        General: No swelling.  Skin:    General: Skin is warm and dry.  Neurological:     Mental Status: She is alert.     Comments: Patient knew her age DOB. She got confuse of the name of place.  Would get confused about the events. Wheelchair Bound Mildly weak in Right Lower leg Cannot raise her Arms due to shoulder Pain  Psychiatric:        Mood and Affect: Mood normal.        Thought Content: Thought content normal.        Judgment: Judgment normal.     Labs reviewed: Basic Metabolic Panel: Recent Labs    07/13/18 1301 07/14/18 0721 07/19/18 0700  NA 139 139 139  K 3.2* 3.5 2.6*  CL 107 110 102  CO2 17* 24 26  GLUCOSE 65* 120* 85  BUN 21 11 19   CREATININE 0.76 0.63 0.53  CALCIUM 8.8* 8.2* 8.4*  MG 1.8  --   --   PHOS 3.1  --   --    Liver Function Tests: Recent Labs    11/08/17 1630 02/25/18 1852 07/10/18 1422  AST 21 22 25   ALT 11 13  14  ALKPHOS 46 52 57  BILITOT 1.1 1.1 0.8  PROT 6.9 7.0 7.0  ALBUMIN 4.5 4.6 4.4   No results for input(s): LIPASE, AMYLASE in the last 8760 hours. No results for input(s): AMMONIA in the last 8760 hours. CBC: Recent Labs    11/21/17 1231 02/25/18 1852 03/11/18 2113 07/10/18 1422 07/19/18 0730  WBC 11.5* 10.9* 10.1 6.3 10.5  NEUTROABS 9.1* 9.3*  --  4.1  --    HGB 11.3* 12.5 11.3* 12.3 10.8*  HCT 33.9* 38.5 36.0 38.6 32.9*  MCV 97.7 102.1* 102.0* 105.2* 99.4  PLT 206 247 203 219 186   Cardiac Enzymes: Recent Labs    11/21/17 1231  TROPONINI <0.03   BNP: Invalid input(s): POCBNP Lab Results  Component Value Date   HGBA1C 5.5 07/11/2018   Lab Results  Component Value Date   TSH 0.831 07/11/2018   Lab Results  Component Value Date   BPZWCHEN27 782 07/11/2018   No results found for: FOLATE No results found for: IRON, TIBC, FERRITIN  Imaging and Procedures obtained prior to SNF admission: No results found.  Assessment/Plan Acute encephalopathy The daughter in the room says patient still continues to have some hallucinations. It is possible that she had a stroke She is on Plavix Will continue with therapy  Tachy-brady syndrome [facility]) Was seen by cardiology and they recommended to just monitor her for now  Chronic diastolic CHF (congestive heart failure)  Her echo was normal in this admission Not on any diuretics  Fall,  Per family at home Patient too weak right now We will continue to work with therapy  Chronic pain syndrome Oxycodone was discontinued in the hospital Tylenol for pain Hypokalemia We will supplement Repeat BMP Magnesium was normal in 03/03 Failure to thrive in adult She has lost 1 more pound today and is 69 pounds now I discussed in detail with her daughter I provided them with MOST form Told them that she is not a candidate for tube feed are CPR She will discuss with her sister and would let us know about her advanced care planning. Anemia Repeat CBC Not a candidate for aggressive work-up H/o B12 def On Supplement Level was 204 in hopsital Microscopic hematuria Noticed on UA No aggressive work-up right now.  Family/ staff Communication:   Labs/tests ordered: BMP and CBC in 1 week

## 2018-07-23 ENCOUNTER — Non-Acute Institutional Stay (SKILLED_NURSING_FACILITY): Payer: Medicare HMO | Admitting: Adult Health

## 2018-07-23 ENCOUNTER — Encounter: Payer: Self-pay | Admitting: Adult Health

## 2018-07-23 DIAGNOSIS — K279 Peptic ulcer, site unspecified, unspecified as acute or chronic, without hemorrhage or perforation: Secondary | ICD-10-CM

## 2018-07-23 DIAGNOSIS — I495 Sick sinus syndrome: Secondary | ICD-10-CM

## 2018-07-23 DIAGNOSIS — I5032 Chronic diastolic (congestive) heart failure: Secondary | ICD-10-CM | POA: Diagnosis not present

## 2018-07-23 NOTE — Progress Notes (Signed)
Location:    [place] Room Number: 109/D Place of Service:  SNF (31)   CODE STATUS: Full Code  Allergies  Allergen Reactions  . Sulfa Antibiotics Other (See Comments)    Reaction:  Unknown   . Aspirin Other (See Comments)    Reaction:  Burning of pts stomach     Chief Complaint  Patient presents with  . Medical Management of Chronic Issues    Tachy-brady syndrome; chronic diastolic CHF(congestive heart failure) PUD(peptic ulcer disease). Weekly follow up for the first 30 days post hospitalization.     HPI:  She is a 85 year old long term resident of this facility being seen for the management of her chronic illnesses: tachybrady syndrome; chf; pud. There are no reports of heart burn; no reports of agitation or anxiety. There are no fevers present.   Past Medical History:  Diagnosis Date  . Anemia   . Arthritis   . Cancer ([place])    uterine surg only  . CHF (congestive heart failure) ([place])   . Colitis 12/31/2012   presumed C.Diff. patient declined colonoscopy  . Hypotension   . Kidney stone   . Muscle weakness (generalized)   . Osteoarthritis   . Pancreatic atrophy 12/31/2012  . Pneumonia     Past Surgical History:  Procedure Laterality Date  . ABDOMINAL HYSTERECTOMY    . CHOLECYSTECTOMY    . ESOPHAGOGASTRODUODENOSCOPY N/A 03/03/2013   RMR: Significant gastric ulcer without bleeding stigmata requiring no endoscopic intervention today. Noncritical Schatzki's ring. Small hiatal hernia  . ESOPHAGOGASTRODUODENOSCOPY N/A 08/24/2013   Dr. [name]: Hiatal hernia.  Gastric mucosal scar formation at site of previously noted gastric ulcerations  . JOINT REPLACEMENT    . LEFT KNEE SURGERY AND REVISION    . RIGHT HIP SURGERY      Social History   Socioeconomic History  . Marital status: Widowed    Spouse name: Not on file  . Number of children: Not on file  . Years of education: Not on file  . Highest education level: Not on file  Occupational  History  . Not on file  Social Needs  . Financial resource strain: Not on file  . Food insecurity:    Worry: Not on file    Inability: Not on file  . Transportation needs:    Medical: Not on file    Non-medical: Not on file  Tobacco Use  . Smoking status: Former Smoker    Types: Cigarettes    Last attempt to quit: 04/25/1951    Years since quitting: 67.2  . Smokeless tobacco: Never Used  Substance and Sexual Activity  . Alcohol use: No    Alcohol/week: 1.0 standard drinks    Types: 1 Glasses of wine per week  . Drug use: No  . Sexual activity: Never  Lifestyle  . Physical activity:    Days per week: Not on file    Minutes per session: Not on file  . Stress: Not on file  Relationships  . Social connections:    Talks on phone: Not on file    Gets together: Not on file    Attends religious service: Not on file    Active member of club or organization: Not on file    Attends meetings of clubs or organizations: Not on file    Relationship status: Not on file  . Intimate partner violence:    Fear of current or ex partner: Not on file    Emotionally  abused: Not on file    Physically abused: Not on file    Forced sexual activity: Not on file  Other Topics Concern  . Not on file  Social History Narrative  . Not on file   Family History  Problem Relation Age of Onset  . Cancer Mother       VITAL SIGNS BP 106/60   Pulse 80   Temp (!) 97.2 F (36.2 C) (Oral)   Resp 18   Ht 4\' 5"  (1.346 m)   Wt 69 lb 3.2 oz (31.4 kg)   SpO2 93%   BMI 17.32 kg/m   Outpatient Encounter Medications as of 07/23/2018  Medication Sig  . acetaminophen (TYLENOL) 500 MG tablet Take 500 mg by mouth every 6 (six) hours as needed for mild pain, moderate pain, fever or headache.   . albuterol (PROAIR HFA) 108 (90 BASE) MCG/ACT inhaler Inhale 2 puffs into the lungs every 6 (six) hours as needed for wheezing or shortness of breath.   . ALPRAZolam (XANAX) 0.25 MG tablet Take 1 tablet (0.25 mg  total) by mouth daily as needed for up to 14 days for anxiety.  Marland Kitchen alum & mag hydroxide-simeth (MAALOX REGULAR STRENGTH) 200-200-20 MG/5ML suspension Take 15 mLs by mouth every 6 (six) hours as needed for indigestion or heartburn.  Roseanne Kaufman [place]-Castor Oil (VENELEX) OINT Apply topically to sacrum and bilateral buttocks every shift and as needed for blanchable erythema  . clopidogrel (PLAVIX) 75 MG tablet Take 1 tablet (75 mg total) by mouth daily.  . diclofenac sodium (VOLTAREN) 1 % GEL Apply 2 g topically 4 (four) times daily.   Marland Kitchen docusate sodium (COLACE) 100 MG capsule Take 100 mg by mouth daily.   . NON FORMULARY Diet Type:  NAS  . pantoprazole (PROTONIX) 40 MG tablet Take 40 mg by mouth every morning.   . polyethylene glycol (MIRALAX / GLYCOLAX) packet Take 17 g by mouth daily.   . potassium chloride (K-DUR,KLOR-CON) 10 MEQ tablet Take 20 mEq by mouth daily.  . Skin Protectants, Misc. (NO-STING SKIN-PREP EX) Apply topically to bilateral heels every shift for prevention  . vitamin B-12 100 MCG tablet Take 1 tablet (100 mcg total) by mouth daily.   No facility-administered encounter medications on file as of 07/23/2018.      SIGNIFICANT DIAGNOSTIC EXAMS  PREVIOUS:   07-10-18: chest x-ray: No acute abnormality noted.  07-10-18: ct of head: Chronic atrophic and ischemic changes without acute abnormality.  07-11-18: 2-d echo:  1. The left ventricle has normal systolic function with an ejection fraction of 60-65%. The cavity size was normal. Left ventricular diastolic Doppler parameters are indeterminate.  2. The right ventricle has normal systolic function. The cavity was normal. There is no increase in right ventricular wall thickness.  3. Left atrial size was moderately dilated.  4. The mitral valve is normal in structure. Mild thickening of the mitral valve leaflet. There is mild mitral annular calcification present.  5. The tricuspid valve is normal in structure.  6. The aortic valve is  tricuspid Mild thickening of the aortic valve.  7. The pulmonic valve was normal in structure.  8. Normal LV function; moderate LAE; mild MR and TR.  07-12-18: ct of head: 1. Atrophy and white matter microvascular disease not changed from prior. 2. No acute findings.  NO NEW EXAMS.   LABS REVIEWED PREVIOUS:   07-10-18: wbc 6.3; hgb 12.3; hct 38.6; mcv 105.2; plt 219; glucose 108; bun 17 creat 0.67; k+ 3.8;  na++ 142; ca 9.0 liver normal albumin 4.4 urine culture: no growth urine drug screen neg 07-11-18: chol 178; ldl 98  trig 65 hdl 67; tsh 0.831; hgb a1c 5.5 bit B 12 204  07-13-18: glucose 65; bun 21; creat 0.76; k+ 3.2; an++ 139; ca 8.8 mag 1.8; phos 3.1 07-14-18: glucose 120; bun 11; creat 0.63; k+ 3.5; na++ 139   NO NEW LABS.    Review of Systems  Reason unable to perform ROS: unable to participate    Physical Exam Constitutional:      General: She is not in acute distress.    Appearance: She is well-developed. She is not diaphoretic.     Comments: frail  Neck:     Thyroid: No thyromegaly.  Cardiovascular:     Rate and Rhythm: Normal rate and regular rhythm.     Heart sounds: Normal heart sounds.  Pulmonary:     Effort: Pulmonary effort is normal. No respiratory distress.     Breath sounds: Normal breath sounds.  Abdominal:     General: Bowel sounds are normal. There is no distension.     Palpations: Abdomen is soft.     Tenderness: There is no abdominal tenderness.  Musculoskeletal:     Right lower leg: No edema.     Left lower leg: No edema.     Comments: Is able to move all extremities  Right leg shorter wears built up heel.    Lymphadenopathy:     Cervical: No cervical adenopathy.  Skin:    General: Skin is warm and dry.     Comments: Bilateral lower extremities discolored   Neurological:     Mental Status: She is alert. Mental status is at baseline.  Psychiatric:        Mood and Affect: Mood normal.      ASSESSMENT/ PLAN:  TODAY:   1. Pud : is stable will  continue protonix 40 mg daily   2. Chronic diastolic heart failure: is presently compensated will monitor   3. Tachy/bradycardia syndrome: is asymptomatic no appropriate for pace maker; will monitor will continue plavix 75 mg daily     PREVIOUS   4. Chronic constipation: is stable will continue colace daily and miralax daily   5. Protein-calorie malnutrition, severe: is stable weight is 71 pounds; will continue supplements as indicated  6. Chronic pain syndrome: pain is presently managed with voltaren gel 2 gm four times daily   7. Generalized anxiety disorder: is stable will continue xanax 0.25 mg daily as needed for 14 days.   8. Vit B 12 deficiency: is stable will continue supplement as directed         MD is aware of resident's narcotic use and is in agreement with current plan of care. We will attempt to wean resident as apropriate   Ok Edwards NP [facility]  Contact [phone] Monday through Friday 8am- 5pm  After hours call [phone]

## 2018-07-26 ENCOUNTER — Encounter (HOSPITAL_COMMUNITY)
Admission: RE | Admit: 2018-07-26 | Discharge: 2018-07-26 | Disposition: A | Discharge status: Home / Self Care | Payer: Medicare HMO | Source: Skilled Nursing Facility | Attending: Internal Medicine | Admitting: Internal Medicine

## 2018-07-26 ENCOUNTER — Encounter: Payer: Self-pay | Admitting: Adult Health

## 2018-07-26 ENCOUNTER — Non-Acute Institutional Stay (SKILLED_NURSING_FACILITY): Payer: Medicare HMO | Admitting: Adult Health

## 2018-07-26 DIAGNOSIS — R52 Pain, unspecified: Secondary | ICD-10-CM | POA: Diagnosis not present

## 2018-07-26 DIAGNOSIS — R69 Illness, unspecified: Secondary | ICD-10-CM | POA: Diagnosis not present

## 2018-07-26 DIAGNOSIS — R627 Adult failure to thrive: Secondary | ICD-10-CM | POA: Diagnosis not present

## 2018-07-26 DIAGNOSIS — F0390 Unspecified dementia without behavioral disturbance: Secondary | ICD-10-CM | POA: Diagnosis not present

## 2018-07-26 LAB — CBC
HCT: 39.3 % (ref 36.0–46.0)
Hemoglobin: 12.3 g/dL (ref 12.0–15.0)
MCH: 32.9 pg (ref 26.0–34.0)
MCHC: 31.3 g/dL (ref 30.0–36.0)
MCV: 105.1 fL — ABNORMAL HIGH (ref 80.0–100.0)
PLATELETS: 405 10*3/uL — AB (ref 150–400)
RBC: 3.74 MIL/uL — ABNORMAL LOW (ref 3.87–5.11)
RDW: 13.7 % (ref 11.5–15.5)
WBC: 14.2 10*3/uL — ABNORMAL HIGH (ref 4.0–10.5)
nRBC: 0 % (ref 0.0–0.2)

## 2018-07-26 LAB — BASIC METABOLIC PANEL
Anion gap: 10 (ref 5–15)
BUN: 54 mg/dL — AB (ref 8–23)
CO2: 26 mmol/L (ref 22–32)
Calcium: 9.2 mg/dL (ref 8.9–10.3)
Chloride: 112 mmol/L — ABNORMAL HIGH (ref 98–111)
Creatinine, Ser: 0.84 mg/dL (ref 0.44–1.00)
GFR calc Af Amer: >60 mL/min (ref >60)
GFR calc non Af Amer: 58 mL/min — ABNORMAL LOW (ref >60)
Glucose, Bld: 96 mg/dL (ref 70–99)
POTASSIUM: 4 mmol/L (ref 3.5–5.1)
Sodium: 148 mmol/L — ABNORMAL HIGH (ref 135–145)

## 2018-07-26 NOTE — Progress Notes (Signed)
Location:   [facility] Room Number: 109 D Place of Service:  SNF (31)   CODE STATUS: DNR  Allergies  Allergen Reactions  . Sulfa Antibiotics Other (See Comments)    Reaction:  Unknown   . Aspirin Other (See Comments)    Reaction:  Burning of pts stomach     Chief Complaint  Patient presents with  . Acute Visit    End of life    HPI:  She continues to decline; her appetite is poor; she continues to slowly lose weight. She does have joint pain present. She does have tylenol for pain; which is not sufficient for relief. Her daughter is present at bed side. We have discussed her pain management. She is in agreement with using roxanol as needed for pain relief.   Past Medical History:  Diagnosis Date  . Anemia   . Arthritis   . Cancer ([place])    uterine surg only  . CHF (congestive heart failure) ([place])   . Colitis 12/31/2012   presumed C.Diff. patient declined colonoscopy  . Hypotension   . Kidney stone   . Muscle weakness (generalized)   . Osteoarthritis   . Pancreatic atrophy 12/31/2012  . Pneumonia     Past Surgical History:  Procedure Laterality Date  . ABDOMINAL HYSTERECTOMY    . CHOLECYSTECTOMY    . ESOPHAGOGASTRODUODENOSCOPY N/A 03/03/2013   RMR: Significant gastric ulcer without bleeding stigmata requiring no endoscopic intervention today. Noncritical Schatzki's ring. Small hiatal hernia  . ESOPHAGOGASTRODUODENOSCOPY N/A 08/24/2013   Dr. [name]: Hiatal hernia.  Gastric mucosal scar formation at site of previously noted gastric ulcerations  . JOINT REPLACEMENT    . LEFT KNEE SURGERY AND REVISION    . RIGHT HIP SURGERY      Social History   Socioeconomic History  . Marital status: Widowed    Spouse name: Not on file  . Number of children: Not on file  . Years of education: Not on file  . Highest education level: Not on file  Occupational History  . Not on file  Social Needs  . Financial resource strain: Not on file  . Food  insecurity:    Worry: Not on file    Inability: Not on file  . Transportation needs:    Medical: Not on file    Non-medical: Not on file  Tobacco Use  . Smoking status: Former Smoker    Types: Cigarettes    Last attempt to quit: 04/25/1951    Years since quitting: 67.2  . Smokeless tobacco: Never Used  Substance and Sexual Activity  . Alcohol use: No    Alcohol/week: 1.0 standard drinks    Types: 1 Glasses of wine per week  . Drug use: No  . Sexual activity: Never  Lifestyle  . Physical activity:    Days per week: Not on file    Minutes per session: Not on file  . Stress: Not on file  Relationships  . Social connections:    Talks on phone: Not on file    Gets together: Not on file    Attends religious service: Not on file    Active member of club or organization: Not on file    Attends meetings of clubs or organizations: Not on file    Relationship status: Not on file  . Intimate partner violence:    Fear of current or ex partner: Not on file    Emotionally abused: Not on file  Physically abused: Not on file    Forced sexual activity: Not on file  Other Topics Concern  . Not on file  Social History Narrative  . Not on file   Family History  Problem Relation Age of Onset  . Cancer Mother       VITAL SIGNS BP 95/69   Pulse 87   Temp (!) 97.1 F (36.2 C)   Resp 18   Ht 4\' 5"  (1.346 m)   Wt 69 lb 3.2 oz (31.4 kg)   BMI 17.32 kg/m   Outpatient Encounter Medications as of 07/26/2018  Medication Sig  . acetaminophen (TYLENOL) 500 MG tablet Take 500 mg by mouth every 6 (six) hours as needed for mild pain, moderate pain, fever or headache.   . albuterol (PROAIR HFA) 108 (90 BASE) MCG/ACT inhaler Inhale 2 puffs into the lungs every 6 (six) hours as needed for wheezing or shortness of breath.   . ALPRAZolam (XANAX) 0.25 MG tablet Take 1 tablet (0.25 mg total) by mouth daily as needed for up to 14 days for anxiety.  Marland Kitchen alum & mag hydroxide-simeth (MAALOX REGULAR  STRENGTH) 200-200-20 MG/5ML suspension Take 15 mLs by mouth every 6 (six) hours as needed for indigestion or heartburn.  Roseanne Kaufman Peru-Castor Oil (VENELEX) OINT Apply topically to sacrum and bilateral buttocks every shift and as needed for blanchable erythema  . clopidogrel (PLAVIX) 75 MG tablet Take 1 tablet (75 mg total) by mouth daily.  . diclofenac sodium (VOLTAREN) 1 % GEL Apply 2 g topically 4 (four) times daily as needed.   . docusate sodium (COLACE) 100 MG capsule Take 100 mg by mouth daily.   . NON FORMULARY Diet Type:  NAS  . Nutritional Supplements (ENSURE ENLIVE PO) Take 1 Bottle by mouth 3 (three) times daily between meals.  . pantoprazole (PROTONIX) 40 MG tablet Take 40 mg by mouth every morning.   . polyethylene glycol (MIRALAX / GLYCOLAX) packet Take 17 g by mouth daily.   . Skin Protectants, Misc. (NO-STING SKIN-PREP EX) Apply topically to bilateral heels every shift for prevention  . vitamin B-12 100 MCG tablet Take 1 tablet (100 mcg total) by mouth daily.   No facility-administered encounter medications on file as of 07/26/2018.      SIGNIFICANT DIAGNOSTIC EXAMS   PREVIOUS:   07-10-18: chest x-ray: No acute abnormality noted.  07-10-18: ct of head: Chronic atrophic and ischemic changes without acute abnormality.  07-11-18: 2-d echo:  1. The left ventricle has normal systolic function with an ejection fraction of 60-65%. The cavity size was normal. Left ventricular diastolic Doppler parameters are indeterminate.  2. The right ventricle has normal systolic function. The cavity was normal. There is no increase in right ventricular wall thickness.  3. Left atrial size was moderately dilated.  4. The mitral valve is normal in structure. Mild thickening of the mitral valve leaflet. There is mild mitral annular calcification present.  5. The tricuspid valve is normal in structure.  6. The aortic valve is tricuspid Mild thickening of the aortic valve.  7. The pulmonic valve was  normal in structure.  8. Normal LV function; moderate LAE; mild MR and TR.  07-12-18: ct of head: 1. Atrophy and white matter microvascular disease not changed from prior. 2. No acute findings.  NO NEW EXAMS.   LABS REVIEWED PREVIOUS:   07-10-18: wbc 6.3; hgb 12.3; hct 38.6; mcv 105.2; plt 219; glucose 108; bun 17 creat 0.67; k+ 3.8; na++ 142; ca 9.0 liver  normal albumin 4.4 urine culture: no growth urine drug screen neg 07-11-18: chol 178; ldl 98  trig 65 hdl 67; tsh 0.831; hgb a1c 5.5 bit B 12 204  07-13-18: glucose 65; bun 21; creat 0.76; k+ 3.2; an++ 139; ca 8.8 mag 1.8; phos 3.1 07-14-18: glucose 120; bun 11; creat 0.63; k+ 3.5; na++ 139   NO NEW LABS.   Review of Systems  Reason unable to perform ROS: unable to participate     Physical Exam Constitutional:      General: She is not in acute distress.    Appearance: She is well-developed. She is not diaphoretic.     Comments: Frail   Neck:     Musculoskeletal: Neck supple.     Thyroid: No thyromegaly.  Cardiovascular:     Rate and Rhythm: Normal rate and regular rhythm.     Pulses: Normal pulses.     Heart sounds: Normal heart sounds.  Pulmonary:     Effort: Pulmonary effort is normal. No respiratory distress.     Breath sounds: Normal breath sounds.  Abdominal:     General: Bowel sounds are normal. There is no distension.     Palpations: Abdomen is soft.     Tenderness: There is no abdominal tenderness.  Musculoskeletal:     Right lower leg: No edema.     Left lower leg: No edema.     Comments: Is able to move all extremities   Lymphadenopathy:     Cervical: No cervical adenopathy.  Skin:    General: Skin is warm and dry.     Comments: Bilateral lower extremities discolored   Neurological:     Mental Status: She is alert. Mental status is at baseline.  Psychiatric:        Mood and Affect: Mood normal.    ASSESSMENT/ PLAN:  TODAY:   1. Dementia without behavioral disturbance  2. Failure to thrive in adult 3.  Generalized pain.   Will stop vit B 12 and plavix Will begin roxanol 5 mg every 2 hours as needed   MD is aware of resident's narcotic use and is in agreement with current plan of care. We will attempt to wean resident as apropriate   Ok Edwards NP [facility]  Contact [phone] Monday through Friday 8am- 5pm  After hours call [phone]

## 2018-07-27 ENCOUNTER — Other Ambulatory Visit: Payer: Self-pay | Admitting: Adult Health

## 2018-07-27 MED ORDER — MORPHINE SULFATE (CONCENTRATE) 20 MG/ML PO SOLN: 5.0000 mg | ORAL | 0 refills | Status: DC | PRN

## 2018-07-28 ENCOUNTER — Encounter: Payer: Self-pay | Admitting: Adult Health

## 2018-07-28 ENCOUNTER — Non-Acute Institutional Stay (SKILLED_NURSING_FACILITY): Payer: Medicare HMO | Admitting: Adult Health

## 2018-07-28 DIAGNOSIS — R627 Adult failure to thrive: Secondary | ICD-10-CM | POA: Diagnosis not present

## 2018-07-28 DIAGNOSIS — F0390 Unspecified dementia without behavioral disturbance: Secondary | ICD-10-CM

## 2018-07-28 DIAGNOSIS — R52 Pain, unspecified: Secondary | ICD-10-CM | POA: Diagnosis not present

## 2018-07-28 DIAGNOSIS — R69 Illness, unspecified: Secondary | ICD-10-CM | POA: Diagnosis not present

## 2018-07-28 NOTE — Progress Notes (Signed)
Location:   [facility] Room Number: 109 D Place of Service:  SNF (31)   CODE STATUS: DNR  Allergies  Allergen Reactions  . Sulfa Antibiotics Other (See Comments)    Reaction:  Unknown   . Aspirin Other (See Comments)    Reaction:  Burning of pts stomach     Chief Complaint  Patient presents with  . Acute Visit    Care Plan meeting    HPI:  We have come together for her routine care plan meeting. She is comfort care only. Her appetite remains poor. Her pain is being managed with prn morphine. There are no reports of anxiety or agitation. No reports of fevers present. Her current weight is 69 pounds.   Past Medical History:  Diagnosis Date  . Anemia   . Arthritis   . Cancer ([place])    uterine surg only  . CHF (congestive heart failure) ([place])   . Colitis 12/31/2012   presumed C.Diff. patient declined colonoscopy  . Hypotension   . Kidney stone   . Muscle weakness (generalized)   . Osteoarthritis   . Pancreatic atrophy 12/31/2012  . Pneumonia     Past Surgical History:  Procedure Laterality Date  . ABDOMINAL HYSTERECTOMY    . CHOLECYSTECTOMY    . ESOPHAGOGASTRODUODENOSCOPY N/A 03/03/2013   RMR: Significant gastric ulcer without bleeding stigmata requiring no endoscopic intervention today. Noncritical Schatzki's ring. Small hiatal hernia  . ESOPHAGOGASTRODUODENOSCOPY N/A 08/24/2013   Dr. [name]: Hiatal hernia.  Gastric mucosal scar formation at site of previously noted gastric ulcerations  . JOINT REPLACEMENT    . LEFT KNEE SURGERY AND REVISION    . RIGHT HIP SURGERY      Social History   Socioeconomic History  . Marital status: Widowed    Spouse name: Not on file  . Number of children: Not on file  . Years of education: Not on file  . Highest education level: Not on file  Occupational History  . Not on file  Social Needs  . Financial resource strain: Not on file  . Food insecurity:    Worry: Not on file    Inability: Not on file   . Transportation needs:    Medical: Not on file    Non-medical: Not on file  Tobacco Use  . Smoking status: Former Smoker    Types: Cigarettes    Last attempt to quit: 04/25/1951    Years since quitting: 67.3  . Smokeless tobacco: Never Used  Substance and Sexual Activity  . Alcohol use: No    Alcohol/week: 1.0 standard drinks    Types: 1 Glasses of wine per week  . Drug use: No  . Sexual activity: Never  Lifestyle  . Physical activity:    Days per week: Not on file    Minutes per session: Not on file  . Stress: Not on file  Relationships  . Social connections:    Talks on phone: Not on file    Gets together: Not on file    Attends religious service: Not on file    Active member of club or organization: Not on file    Attends meetings of clubs or organizations: Not on file    Relationship status: Not on file  . Intimate partner violence:    Fear of current or ex partner: Not on file    Emotionally abused: Not on file    Physically abused: Not on file    Forced sexual activity:  Not on file  Other Topics Concern  . Not on file  Social History Narrative  . Not on file   Family History  Problem Relation Age of Onset  . Cancer Mother       VITAL SIGNS BP 96/65   Pulse 72   Temp (!) 97.3 F (36.3 C)   Resp 16   Ht 4\' 5"  (1.346 m)   Wt 69 lb 3.2 oz (31.4 kg)   BMI 17.32 kg/m   Outpatient Encounter Medications as of 07/28/2018  Medication Sig  . acetaminophen (TYLENOL) 500 MG tablet Take 500 mg by mouth every 6 (six) hours as needed for mild pain, moderate pain, fever or headache.   . albuterol (PROAIR HFA) 108 (90 BASE) MCG/ACT inhaler Inhale 2 puffs into the lungs every 6 (six) hours as needed for wheezing or shortness of breath.   . ALPRAZolam (XANAX) 0.25 MG tablet Take 1 tablet (0.25 mg total) by mouth daily as needed for up to 14 days for anxiety.  Marland Kitchen alum & mag hydroxide-simeth (MAALOX REGULAR STRENGTH) 200-200-20 MG/5ML suspension Take 15 mLs by mouth  every 6 (six) hours as needed for indigestion or heartburn.  Roseanne Kaufman Peru-Castor Oil (VENELEX) OINT Apply topically to sacrum and bilateral buttocks every shift and as needed for blanchable erythema  . diclofenac sodium (VOLTAREN) 1 % GEL Apply 2 g topically 4 (four) times daily as needed.   . docusate sodium (COLACE) 100 MG capsule Take 100 mg by mouth daily.   Marland Kitchen morphine (ROXANOL) 20 MG/ML concentrated solution Take 0.25 mLs (5 mg total) by mouth every 2 (two) hours as needed for severe pain.  . NON FORMULARY Diet Type:  NAS  . Nutritional Supplements (ENSURE ENLIVE PO) Take 1 Bottle by mouth 3 (three) times daily between meals.  . pantoprazole (PROTONIX) 40 MG tablet Take 40 mg by mouth every morning.   . polyethylene glycol (MIRALAX / GLYCOLAX) packet Take 17 g by mouth daily.   . Wound Dressings (ALLEVYN ADHESIVE EX) Apply allevyn foam dressing topically to bilateral heels and change every 3 days and as needed for prevention  . [DISCONTINUED] clopidogrel (PLAVIX) 75 MG tablet Take 1 tablet (75 mg total) by mouth daily. (Patient not taking: Reported on 07/28/2018)  . [DISCONTINUED] Skin Protectants, Misc. (NO-STING SKIN-PREP EX) Apply topically to bilateral heels every shift for prevention  . [DISCONTINUED] vitamin B-12 100 MCG tablet Take 1 tablet (100 mcg total) by mouth daily. (Patient not taking: Reported on 07/28/2018)   No facility-administered encounter medications on file as of 07/28/2018.      SIGNIFICANT DIAGNOSTIC EXAMS   PREVIOUS:   07-10-18: chest x-ray: No acute abnormality noted.  07-10-18: ct of head: Chronic atrophic and ischemic changes without acute abnormality.  07-11-18: 2-d echo:  1. The left ventricle has normal systolic function with an ejection fraction of 60-65%. The cavity size was normal. Left ventricular diastolic Doppler parameters are indeterminate.  2. The right ventricle has normal systolic function. The cavity was normal. There is no increase in right  ventricular wall thickness.  3. Left atrial size was moderately dilated.  4. The mitral valve is normal in structure. Mild thickening of the mitral valve leaflet. There is mild mitral annular calcification present.  5. The tricuspid valve is normal in structure.  6. The aortic valve is tricuspid Mild thickening of the aortic valve.  7. The pulmonic valve was normal in structure.  8. Normal LV function; moderate LAE; mild MR and TR.  07-12-18: ct of head: 1. Atrophy and white matter microvascular disease not changed from prior. 2. No acute findings.  NO NEW EXAMS.   LABS REVIEWED PREVIOUS:   07-10-18: wbc 6.3; hgb 12.3; hct 38.6; mcv 105.2; plt 219; glucose 108; bun 17 creat 0.67; k+ 3.8; na++ 142; ca 9.0 liver normal albumin 4.4 urine culture: no growth urine drug screen neg 07-11-18: chol 178; ldl 98  trig 65 hdl 67; tsh 0.831; hgb a1c 5.5 bit B 12 204  07-13-18: glucose 65; bun 21; creat 0.76; k+ 3.2; an++ 139; ca 8.8 mag 1.8; phos 3.1 07-14-18: glucose 120; bun 11; creat 0.63; k+ 3.5; na++ 139   NO NEW LABS.   Review of Systems  Unable to perform ROS: Dementia (unable to participate )     Physical Exam Constitutional:      General: She is not in acute distress.    Appearance: She is well-developed. She is not diaphoretic.     Comments: Frail   Neck:     Musculoskeletal: Neck supple.     Thyroid: No thyromegaly.  Cardiovascular:     Rate and Rhythm: Normal rate and regular rhythm.     Pulses: Normal pulses.     Heart sounds: Normal heart sounds.  Pulmonary:     Effort: Pulmonary effort is normal. No respiratory distress.     Breath sounds: Normal breath sounds.  Abdominal:     General: Bowel sounds are normal. There is no distension.     Palpations: Abdomen is soft.     Tenderness: There is no abdominal tenderness.  Musculoskeletal:     Right lower leg: No edema.     Left lower leg: No edema.     Comments: Is able to move all extremities   Lymphadenopathy:     Cervical: No  cervical adenopathy.  Skin:    General: Skin is warm and dry.     Comments: Bilateral lower extremities discolored   Neurological:     Mental Status: She is alert. Mental status is at baseline.  Psychiatric:        Mood and Affect: Mood normal.       ASSESSMENT/ PLAN:  TODAY:   1. Failure to thrive in adult 2. Dementia without behavioral disturbance 3. Generalized pain   Will continue her current medication regimen Will continue her current plan of care  Will monitor her status,       MD is aware of resident's narcotic use and is in agreement with current plan of care. We will attempt to wean resident as apropriate   Ok Edwards NP [facility]  Contact [phone] Monday through Friday 8am- 5pm  After hours call [phone]

## 2018-07-29 ENCOUNTER — Other Ambulatory Visit: Payer: Self-pay | Admitting: Adult Health

## 2018-07-29 MED ORDER — ALPRAZOLAM 0.25 MG PO TABS: 0.2500 mg | ORAL_TABLET | Freq: Every day | ORAL | 0 refills | Status: AC | PRN

## 2018-07-30 ENCOUNTER — Encounter: Payer: Self-pay | Admitting: Adult Health

## 2018-07-30 ENCOUNTER — Non-Acute Institutional Stay (SKILLED_NURSING_FACILITY): Payer: Medicare HMO | Admitting: Adult Health

## 2018-07-30 DIAGNOSIS — E43 Unspecified severe protein-calorie malnutrition: Secondary | ICD-10-CM

## 2018-07-30 DIAGNOSIS — K5909 Other constipation: Secondary | ICD-10-CM

## 2018-07-30 DIAGNOSIS — R627 Adult failure to thrive: Secondary | ICD-10-CM | POA: Diagnosis not present

## 2018-07-30 DIAGNOSIS — G894 Chronic pain syndrome: Secondary | ICD-10-CM

## 2018-07-30 NOTE — Progress Notes (Signed)
Location:   [place] Room Number: 109 D Place of Service:  SNF (31)   CODE STATUS: DNR  Allergies  Allergen Reactions  . Sulfa Antibiotics Other (See Comments)    Reaction:  Unknown   . Aspirin Other (See Comments)    Reaction:  Burning of pts stomach     Chief Complaint  Patient presents with  . Medical Management of Chronic Issues    Protein calorie malnutrition, severe; chronic pain syndrome; failure to thrive in adult; chronic constipation. Weekly follow up for the first 30 days post hospitalization.     HPI:  She is a 85 year old long term resident of this facility being seen for the management of her chronic illnesses: malnutrition; failure to thrive; constipation; chronic pain. There are no reports of uncontrolled pain; no agitation; no constipation. Her appetite remains poor she is on supplements. The focus of her care is for comfort only.   Past Medical History:  Diagnosis Date  . Anemia   . Arthritis   . Cancer ([place])    uterine surg only  . CHF (congestive heart failure) ([place])   . Colitis 12/31/2012   presumed C.Diff. patient declined colonoscopy  . Hypotension   . Kidney stone   . Muscle weakness (generalized)   . Osteoarthritis   . Pancreatic atrophy 12/31/2012  . Pneumonia     Past Surgical History:  Procedure Laterality Date  . ABDOMINAL HYSTERECTOMY    . CHOLECYSTECTOMY    . ESOPHAGOGASTRODUODENOSCOPY N/A 03/03/2013   RMR: Significant gastric ulcer without bleeding stigmata requiring no endoscopic intervention today. Noncritical Schatzki's ring. Small hiatal hernia  . ESOPHAGOGASTRODUODENOSCOPY N/A 08/24/2013   Dr. [name]: Hiatal hernia.  Gastric mucosal scar formation at site of previously noted gastric ulcerations  . JOINT REPLACEMENT    . LEFT KNEE SURGERY AND REVISION    . RIGHT HIP SURGERY      Social History   Socioeconomic History  . Marital status: Widowed    Spouse name: Not on file  . Number of children:  Not on file  . Years of education: Not on file  . Highest education level: Not on file  Occupational History  . Not on file  Social Needs  . Financial resource strain: Not on file  . Food insecurity:    Worry: Not on file    Inability: Not on file  . Transportation needs:    Medical: Not on file    Non-medical: Not on file  Tobacco Use  . Smoking status: Former Smoker    Types: Cigarettes    Last attempt to quit: 04/25/1951    Years since quitting: 67.3  . Smokeless tobacco: Never Used  Substance and Sexual Activity  . Alcohol use: No    Alcohol/week: 1.0 standard drinks    Types: 1 Glasses of wine per week  . Drug use: No  . Sexual activity: Never  Lifestyle  . Physical activity:    Days per week: Not on file    Minutes per session: Not on file  . Stress: Not on file  Relationships  . Social connections:    Talks on phone: Not on file    Gets together: Not on file    Attends religious service: Not on file    Active member of club or organization: Not on file    Attends meetings of clubs or organizations: Not on file    Relationship status: Not on file  . Intimate partner  violence:    Fear of current or ex partner: Not on file    Emotionally abused: Not on file    Physically abused: Not on file    Forced sexual activity: Not on file  Other Topics Concern  . Not on file  Social History Narrative  . Not on file   Family History  Problem Relation Age of Onset  . Cancer Mother       VITAL SIGNS BP 96/65   Pulse 72   Temp 98.5 F (36.9 C)   Resp 16   Ht 4\' 5"  (1.346 m)   Wt 69 lb 3.2 oz (31.4 kg)   BMI 17.32 kg/m   Outpatient Encounter Medications as of 07/30/2018  Medication Sig  . acetaminophen (TYLENOL) 500 MG tablet Take 500 mg by mouth every 6 (six) hours as needed for mild pain, moderate pain, fever or headache.   . albuterol (PROAIR HFA) 108 (90 BASE) MCG/ACT inhaler Inhale 2 puffs into the lungs every 6 (six) hours as needed for wheezing or  shortness of breath.   . ALPRAZolam (XANAX) 0.25 MG tablet Take 1 tablet (0.25 mg total) by mouth daily as needed for up to 13 days for anxiety.  Marland Kitchen alum & mag hydroxide-simeth (MAALOX REGULAR STRENGTH) 200-200-20 MG/5ML suspension Take 15 mLs by mouth every 6 (six) hours as needed for indigestion or heartburn.  Roseanne Kaufman Peru-Castor Oil (VENELEX) OINT Apply topically to sacrum and bilateral buttocks every shift and as needed for blanchable erythema  . diclofenac sodium (VOLTAREN) 1 % GEL Apply 2 g topically 4 (four) times daily as needed.   . docusate sodium (COLACE) 100 MG capsule Take 100 mg by mouth daily.   Marland Kitchen morphine (ROXANOL) 20 MG/ML concentrated solution Take 0.25 mLs (5 mg total) by mouth every 2 (two) hours as needed for severe pain.  . NON FORMULARY Diet Type:  Regular  . Nutritional Supplements (ENSURE ENLIVE PO) Take 1 Bottle by mouth 3 (three) times daily between meals.  . pantoprazole (PROTONIX) 40 MG tablet Take 40 mg by mouth every morning.   . polyethylene glycol (MIRALAX / GLYCOLAX) packet Take 17 g by mouth daily.   . Wound Dressings (ALLEVYN ADHESIVE EX) Apply allevyn foam dressing topically to bilateral heels and change every 3 days and as needed for prevention   No facility-administered encounter medications on file as of 07/30/2018.      SIGNIFICANT DIAGNOSTIC EXAMS   PREVIOUS:   07-10-18: chest x-ray: No acute abnormality noted.  07-10-18: ct of head: Chronic atrophic and ischemic changes without acute abnormality.  07-11-18: 2-d echo:  1. The left ventricle has normal systolic function with an ejection fraction of 60-65%. The cavity size was normal. Left ventricular diastolic Doppler parameters are indeterminate.  2. The right ventricle has normal systolic function. The cavity was normal. There is no increase in right ventricular wall thickness.  3. Left atrial size was moderately dilated.  4. The mitral valve is normal in structure. Mild thickening of the mitral  valve leaflet. There is mild mitral annular calcification present.  5. The tricuspid valve is normal in structure.  6. The aortic valve is tricuspid Mild thickening of the aortic valve.  7. The pulmonic valve was normal in structure.  8. Normal LV function; moderate LAE; mild MR and TR.  07-12-18: ct of head: 1. Atrophy and white matter microvascular disease not changed from prior. 2. No acute findings.  NO NEW EXAMS.   LABS REVIEWED PREVIOUS:  07-10-18: wbc 6.3; hgb 12.3; hct 38.6; mcv 105.2; plt 219; glucose 108; bun 17 creat 0.67; k+ 3.8; na++ 142; ca 9.0 liver normal albumin 4.4 urine culture: no growth urine drug screen neg 07-11-18: chol 178; ldl 98  trig 65 hdl 67; tsh 0.831; hgb a1c 5.5 bit B 12 204  07-13-18: glucose 65; bun 21; creat 0.76; k+ 3.2; an++ 139; ca 8.8 mag 1.8; phos 3.1 07-14-18: glucose 120; bun 11; creat 0.63; k+ 3.5; na++ 139   TODAY:   07-19-18: wbc 10.5; hgb 10.8; hct 32.9; mcv 99.4 plt 186; glucose bun 19; creat 0.53;  85; k+ 2.6; na++ 139 ca 8.4 07-26-18: wbc 14.2; hgb 12.3; hct 39.3; mcv 105.1; plt 405; glucose 96; bun 54; creat 0.84; k+ 4.0; na++ 148; ca 9.2    Review of Systems  Unable to perform ROS: Dementia (unable to participate )   Physical Exam Constitutional:      General: She is not in acute distress.    Appearance: She is cachectic. She is not diaphoretic.  Neck:     Musculoskeletal: Neck supple.     Thyroid: No thyromegaly.  Cardiovascular:     Rate and Rhythm: Normal rate and regular rhythm.     Pulses: Normal pulses.     Heart sounds: Normal heart sounds.  Pulmonary:     Effort: Pulmonary effort is normal. No respiratory distress.     Breath sounds: Normal breath sounds.  Abdominal:     General: Bowel sounds are normal. There is no distension.     Palpations: Abdomen is soft.     Tenderness: There is no abdominal tenderness.  Musculoskeletal:     Right lower leg: No edema.     Left lower leg: No edema.     Comments: Is able to move all  extremities   Lymphadenopathy:     Cervical: No cervical adenopathy.  Skin:    General: Skin is warm and dry.     Comments: Bilateral lower extremities discolored   Neurological:     Mental Status: She is alert. Mental status is at baseline.  Psychiatric:        Mood and Affect: Mood normal.       ASSESSMENT/ PLAN:  TODAY:   1. Chronic constipation: is stable will continue colace daily and miralax daily   2. Protein calorie malnutrition, severe/failure to thrive in adult; is without change weight is 69 (previous 71); pounds; will continue supplements and will monitor   3. Chronic pain syndrome: pain is managed with voltaren gel 2 gm four times daily as needed and roxanol 5 mg every 2 hours as needed.    PREVIOUS   4. Generalized anxiety disorder: is stable will continue xanax 0.25 mg daily as needed for 14 days.   5. Vit B 12 deficiency: is stable will continue to monitor   6. Pud : is stable will continue protonix 40 mg daily   7. Chronic diastolic heart failure: is presently compensated will monitor   8. Tachy/bradycardia syndrome: is asymptomatic no appropriate for pace maker; will monitor will continue to monitor      MD is aware of resident's narcotic use and is in agreement with current plan of care. We will attempt to wean resident as apropriate   Ok Edwards NP [facility]  Contact [phone] Monday through Friday 8am- 5pm  After hours call [phone]

## 2018-07-31 DIAGNOSIS — F0390 Unspecified dementia without behavioral disturbance: Secondary | ICD-10-CM | POA: Insufficient documentation

## 2018-07-31 DIAGNOSIS — R52 Pain, unspecified: Secondary | ICD-10-CM | POA: Insufficient documentation

## 2018-08-11 ENCOUNTER — Other Ambulatory Visit: Payer: Self-pay | Admitting: Internal Medicine

## 2018-08-12 ENCOUNTER — Encounter: Payer: Self-pay | Admitting: Internal Medicine

## 2018-08-12 ENCOUNTER — Other Ambulatory Visit: Payer: Self-pay | Admitting: Internal Medicine

## 2018-08-12 ENCOUNTER — Non-Acute Institutional Stay (SKILLED_NURSING_FACILITY): Payer: Medicare HMO | Admitting: Internal Medicine

## 2018-08-12 DIAGNOSIS — E870 Hyperosmolality and hypernatremia: Secondary | ICD-10-CM

## 2018-08-12 DIAGNOSIS — E43 Unspecified severe protein-calorie malnutrition: Secondary | ICD-10-CM

## 2018-08-12 DIAGNOSIS — I495 Sick sinus syndrome: Secondary | ICD-10-CM

## 2018-08-12 DIAGNOSIS — R627 Adult failure to thrive: Secondary | ICD-10-CM | POA: Diagnosis not present

## 2018-08-12 MED ORDER — MORPHINE SULFATE (CONCENTRATE) 20 MG/ML PO SOLN: 5.0000 mg | ORAL | 0 refills | Status: DC | PRN

## 2018-08-12 NOTE — Progress Notes (Deleted)
Location:  [place] Room Number: 21 D Place of Service:  SNF [phone]) Provider:  Veleta Miners, MD  Celene Squibb, MD  Patient Care Team: Celene Squibb, MD as PCP - General (Internal Medicine) [name] Cristopher Estimable, MD as Consulting Physician (Gastroenterology)  Extended Emergency Contact Information Primary Emergency Contact: Schoenrock,Gloria [place] Phone: [phone] Relation: Daughter Secondary Emergency Contact: [place] of [place] Phone: [phone] Relation: Daughter  Code Status:  DNR Goals of care: Advanced Directive information Advanced Directives 08/12/2018  Does Patient Have a Medical Advance Directive? Yes  Type of Advance Directive Out of facility DNR (pink MOST or yellow form)  Does patient want to make changes to medical advance directive? No - Patient declined  Copy of [place] in Chart? -  Would patient like information on creating a medical advance directive? No - Patient declined  Pre-existing out of facility DNR order (yellow form or pink MOST form) Yellow form placed in chart (order not valid for inpatient use);Pink MOST form placed in chart (order not valid for inpatient use)     Chief Complaint  Patient presents with  . Acute Visit    Anxiety - Med management    HPI:  Pt is a 85 y.o. female seen today for an acute visit for    Past Medical History:  Diagnosis Date  . Anemia   . Arthritis   . Cancer ([place])    uterine surg only  . CHF (congestive heart failure) ([place])   . Colitis 12/31/2012   presumed C.Diff. patient declined colonoscopy  . Hypotension   . Kidney stone   . Muscle weakness (generalized)   . Osteoarthritis   . Pancreatic atrophy 12/31/2012  . Pneumonia    Past Surgical History:  Procedure Laterality Date  . ABDOMINAL HYSTERECTOMY    . CHOLECYSTECTOMY    . ESOPHAGOGASTRODUODENOSCOPY N/A 03/03/2013   RMR: Significant gastric ulcer  without bleeding stigmata requiring no endoscopic intervention today. Noncritical Schatzki's ring. Small hiatal hernia  . ESOPHAGOGASTRODUODENOSCOPY N/A 08/24/2013   Dr. [name]: Hiatal hernia.  Gastric mucosal scar formation at site of previously noted gastric ulcerations  . JOINT REPLACEMENT    . LEFT KNEE SURGERY AND REVISION    . RIGHT HIP SURGERY      Allergies  Allergen Reactions  . Sulfa Antibiotics Other (See Comments)    Reaction:  Unknown   . Aspirin Other (See Comments)    Reaction:  Burning of pts stomach     Outpatient Encounter Medications as of 08/12/2018  Medication Sig  . acetaminophen (TYLENOL) 500 MG tablet Take 500 mg by mouth every 6 (six) hours as needed for mild pain, moderate pain, fever or headache.   . albuterol (PROAIR HFA) 108 (90 BASE) MCG/ACT inhaler Inhale 2 puffs into the lungs every 6 (six) hours as needed for wheezing or shortness of breath.   Marland Kitchen alum & mag hydroxide-simeth (MAALOX REGULAR STRENGTH) 200-200-20 MG/5ML suspension Take 15 mLs by mouth every 6 (six) hours as needed for indigestion or heartburn.  Roseanne Kaufman Peru-Castor Oil (VENELEX) OINT Apply topically to sacrum and bilateral buttocks every shift and as needed for blanchable erythema  . diclofenac sodium (VOLTAREN) 1 % GEL Apply 2 g topically 4 (four) times daily as needed.   . docusate sodium (COLACE) 100 MG capsule Take 100 mg by mouth daily.   Marland Kitchen morphine (ROXANOL) 20 MG/ML concentrated solution Take 0.25 mLs (5 mg total)  by mouth every 2 (two) hours as needed for severe pain.  . NON FORMULARY Diet Type:  Regular  . Nutritional Supplements (ENSURE ENLIVE PO) Take 1 Bottle by mouth 3 (three) times daily between meals.  . pantoprazole (PROTONIX) 40 MG tablet Take 40 mg by mouth every morning.   . polyethylene glycol (MIRALAX / GLYCOLAX) packet Take 17 g by mouth daily.   . Wound Dressings (ALLEVYN ADHESIVE EX) Apply allevyn foam dressing topically to bilateral heels and change every 3 days and as  needed for prevention   No facility-administered encounter medications on file as of 08/12/2018.     Review of Systems   There is no immunization history on file for this patient. Pertinent  Health Maintenance Due  Topic Date Due  . PNA vac Low Risk Adult (1 of 2 - PCV13) 08/16/2018 (Originally 09/14/1983)  . INFLUENZA VACCINE  12/11/2018  . DEXA SCAN  Discontinued   No flowsheet data found. Functional Status Survey:    Vitals:   08/12/18 1102  Weight: 68 lb 9.6 oz (31.1 kg)  Height: 4\' 5"  (1.346 m)   Body mass index is 17.17 kg/m. Physical Exam  Labs reviewed: Recent Labs    07/13/18 1301 07/14/18 0721 07/19/18 0700 07/26/18 0249  NA 139 139 139 148*  K 3.2* 3.5 2.6* 4.0  CL 107 110 102 112*  CO2 17* 24 26 26   GLUCOSE 65* 120* 85 96  BUN 21 11 19  54*  CREATININE 0.76 0.63 0.53 0.84  CALCIUM 8.8* 8.2* 8.4* 9.2  MG 1.8  --   --   --   PHOS 3.1  --   --   --    Recent Labs    11/08/17 1630 02/25/18 1852 07/10/18 1422  AST 21 22 25   ALT 11 13 14   ALKPHOS 46 52 57  BILITOT 1.1 1.1 0.8  PROT 6.9 7.0 7.0  ALBUMIN 4.5 4.6 4.4   Recent Labs    11/21/17 1231 02/25/18 1852  07/10/18 1422 07/19/18 0730 07/26/18 0249  WBC 11.5* 10.9*   < > 6.3 10.5 14.2*  NEUTROABS 9.1* 9.3*  --  4.1  --   --   HGB 11.3* 12.5   < > 12.3 10.8* 12.3  HCT 33.9* 38.5   < > 38.6 32.9* 39.3  MCV 97.7 102.1*   < > 105.2* 99.4 105.1*  PLT 206 247   < > 219 186 405*   < > = values in this interval not displayed.   Lab Results  Component Value Date   TSH 0.831 07/11/2018   Lab Results  Component Value Date   HGBA1C 5.5 07/11/2018   Lab Results  Component Value Date   CHOL 178 07/11/2018   HDL 67 07/11/2018   LDLCALC 98 07/11/2018   TRIG 65 07/11/2018   CHOLHDL 2.7 07/11/2018    Significant Diagnostic Results in last 30 days:  No results found.  Assessment/Plan There are no diagnoses linked to this encounter.   Family/ staff Communication: ***  Labs/tests  ordered:  ***

## 2018-08-12 NOTE — Progress Notes (Signed)
Location:  [place] Room Number: 67 D Place of Service:  SNF [phone]) Provider:  Veleta Miners, MD  Celene Squibb, MD  Patient Care Team: Celene Squibb, MD as PCP - General (Internal Medicine) [name] Cristopher Estimable, MD as Consulting Physician (Gastroenterology)  Extended Emergency Contact Information Primary Emergency Contact: Schoenrock,Gloria [place] Phone: [phone] Relation: Daughter Secondary Emergency Contact: [place] of [place] Phone: [phone] Relation: Daughter  Code Status:  DNR Goals of care: Advanced Directive information Advanced Directives 08/12/2018  Does Patient Have a Medical Advance Directive? Yes  Type of Advance Directive Out of facility DNR (pink MOST or yellow form)  Does patient want to make changes to medical advance directive? No - Patient declined  Copy of [place] in Chart? -  Would patient like information on creating a medical advance directive? No - Patient declined  Pre-existing out of facility DNR order (yellow form or pink MOST form) Yellow form placed in chart (order not valid for inpatient use);Pink MOST form placed in chart (order not valid for inpatient use)     Chief Complaint  Patient presents with  . Medical Management of Chronic Issues    CHF, Hypotension    HPI:  Pt is a 85 y.o. female seen today for medical management of chronic diseases.   Patient was in the hospital from 02/29-03/05 For Acute Encephalopathy and Slurred speech. Patient has PMH of Chronic Diastolic CHF, Uterine cancer s/p Hysterectomy, Arthritis..Fracture of Right humerus , Right Shoulder Rotator Cuff arthropathy.Hard of hearing, Underweight and Failure to thrive.  Patient is comfort care in facility. She continues to have severe pains in her Back and Knee. She is eating fairly. She is still alert and responds appropriately. Her main complain seems to be pain and  weakness. She wants to get up and able to walk with walker. I told her that it is not possible in her condition. She also c/o Constipation and Dizziness when she tries to get up. No SOB or Fever or Cough   Past Medical History:  Diagnosis Date  . Anemia   . Arthritis   . Cancer ([place])    uterine surg only  . CHF (congestive heart failure) ([place])   . Colitis 12/31/2012   presumed C.Diff. patient declined colonoscopy  . Hypotension   . Kidney stone   . Muscle weakness (generalized)   . Osteoarthritis   . Pancreatic atrophy 12/31/2012  . Pneumonia    Past Surgical History:  Procedure Laterality Date  . ABDOMINAL HYSTERECTOMY    . CHOLECYSTECTOMY    . ESOPHAGOGASTRODUODENOSCOPY N/A 03/03/2013   RMR: Significant gastric ulcer without bleeding stigmata requiring no endoscopic intervention today. Noncritical Schatzki's ring. Small hiatal hernia  . ESOPHAGOGASTRODUODENOSCOPY N/A 08/24/2013   Dr. [name]: Hiatal hernia.  Gastric mucosal scar formation at site of previously noted gastric ulcerations  . JOINT REPLACEMENT    . LEFT KNEE SURGERY AND REVISION    . RIGHT HIP SURGERY      Allergies  Allergen Reactions  . Sulfa Antibiotics Other (See Comments)    Reaction:  Unknown   . Aspirin Other (See Comments)    Reaction:  Burning of pts stomach     Outpatient Encounter Medications as of 08/12/2018  Medication Sig  . acetaminophen (TYLENOL) 500 MG tablet Take 500 mg by mouth every 6 (six) hours as needed for mild pain, moderate pain, fever or headache.   . albuterol (PROAIR  HFA) 108 (90 BASE) MCG/ACT inhaler Inhale 2 puffs into the lungs every 6 (six) hours as needed for wheezing or shortness of breath.   Marland Kitchen alum & mag hydroxide-simeth (MAALOX REGULAR STRENGTH) 200-200-20 MG/5ML suspension Take 15 mLs by mouth every 6 (six) hours as needed for indigestion or heartburn.  Roseanne Kaufman Peru-Castor Oil (VENELEX) OINT Apply topically to sacrum and bilateral buttocks every shift and as needed for  blanchable erythema  . diclofenac sodium (VOLTAREN) 1 % GEL Apply 2 g topically 4 (four) times daily as needed.   . docusate sodium (COLACE) 100 MG capsule Take 100 mg by mouth daily.   Marland Kitchen morphine (ROXANOL) 20 MG/ML concentrated solution Take 0.25 mLs (5 mg total) by mouth every 2 (two) hours as needed for severe pain.  . NON FORMULARY Diet Type:  Regular  . Nutritional Supplements (ENSURE ENLIVE PO) Take 1 Bottle by mouth 3 (three) times daily between meals.  . pantoprazole (PROTONIX) 40 MG tablet Take 40 mg by mouth every morning.   . polyethylene glycol (MIRALAX / GLYCOLAX) packet Take 17 g by mouth daily.   . Wound Dressings (ALLEVYN ADHESIVE EX) Apply allevyn foam dressing topically to bilateral heels and change every 3 days and as needed for prevention   No facility-administered encounter medications on file as of 08/12/2018.     Review of Systems  Constitutional: Positive for activity change and unexpected weight change.  HENT: Negative.   Respiratory: Negative.   Cardiovascular: Negative.   Gastrointestinal: Positive for abdominal pain and constipation.  Genitourinary: Negative.   Musculoskeletal: Positive for back pain, gait problem and myalgias.  Skin: Negative.   Neurological: Positive for dizziness and weakness.  Psychiatric/Behavioral: Positive for confusion and dysphoric mood.     There is no immunization history on file for this patient. Pertinent  Health Maintenance Due  Topic Date Due  . PNA vac Low Risk Adult (1 of 2 - PCV13) 08/16/2018 (Originally 09/14/1983)  . INFLUENZA VACCINE  12/11/2018  . DEXA SCAN  Discontinued   No flowsheet data found. Functional Status Survey:    Vitals:   08/12/18 1102  BP: (!) 81/54  Pulse: 64  Resp: 16  Temp: 97.6 F (36.4 C)  Weight: 68 lb 9.6 oz (31.1 kg)  Height: 4\' 5"  (1.346 m)   Body mass index is 17.17 kg/m. Physical Exam Vitals signs reviewed.  Constitutional:      Appearance: She is ill-appearing.  HENT:      Head: Normocephalic.     Nose: Nose normal.     Mouth/Throat:     Mouth: Mucous membranes are dry.  Eyes:     Pupils: Pupils are equal, round, and reactive to light.  Neck:     Musculoskeletal: Neck supple.  Cardiovascular:     Rate and Rhythm: Normal rate and regular rhythm.     Pulses: Normal pulses.  Pulmonary:     Effort: Pulmonary effort is normal.     Breath sounds: Normal breath sounds.  Abdominal:     General: Abdomen is flat.     Comments: C/O Pain in her Midabdomen  Musculoskeletal:        General: No swelling.  Skin:    General: Skin is warm.  Neurological:     Mental Status: She is alert.     Comments: No focal deficit. Does know her DOB and very responsive  Psychiatric:        Mood and Affect: Mood normal.  Thought Content: Thought content normal.     Labs reviewed: Recent Labs    07/13/18 1301 07/14/18 0721 07/19/18 0700 07/26/18 0249  NA 139 139 139 148*  K 3.2* 3.5 2.6* 4.0  CL 107 110 102 112*  CO2 17* 24 26 26   GLUCOSE 65* 120* 85 96  BUN 21 11 19  54*  CREATININE 0.76 0.63 0.53 0.84  CALCIUM 8.8* 8.2* 8.4* 9.2  MG 1.8  --   --   --   PHOS 3.1  --   --   --    Recent Labs    11/08/17 1630 02/25/18 1852 07/10/18 1422  AST 21 22 25   ALT 11 13 14   ALKPHOS 46 52 57  BILITOT 1.1 1.1 0.8  PROT 6.9 7.0 7.0  ALBUMIN 4.5 4.6 4.4   Recent Labs    11/21/17 1231 02/25/18 1852  07/10/18 1422 07/19/18 0730 07/26/18 0249  WBC 11.5* 10.9*   < > 6.3 10.5 14.2*  NEUTROABS 9.1* 9.3*  --  4.1  --   --   HGB 11.3* 12.5   < > 12.3 10.8* 12.3  HCT 33.9* 38.5   < > 38.6 32.9* 39.3  MCV 97.7 102.1*   < > 105.2* 99.4 105.1*  PLT 206 247   < > 219 186 405*   < > = values in this interval not displayed.   Lab Results  Component Value Date   TSH 0.831 07/11/2018   Lab Results  Component Value Date   HGBA1C 5.5 07/11/2018   Lab Results  Component Value Date   CHOL 178 07/11/2018   HDL 67 07/11/2018   LDLCALC 98 07/11/2018   TRIG 65  07/11/2018   CHOLHDL 2.7 07/11/2018    Significant Diagnostic Results in last 30 days:  No results found.  Assessment/Plan Failure to Thrive in adult with End of Life care Also has h/o Tachy brady , Hypernatremia,and Arthritis   Patient not doing well overall. Will continue Comfort care Will continue her on Roxanol and also start on Standing dose as not sure of she is calling for Pain meds. Also Continue on Protonix and Miralax     Family/ staff Communication:   Labs/tests ordered:    Total time spent in this patient care encounter was _25 minutes; greater than 50% of the visit spent counseling patient, reviewing records , Labs and coordinating care for problems addressed at this encounter.

## 2018-08-18 ENCOUNTER — Encounter: Payer: Self-pay | Admitting: Internal Medicine

## 2018-08-18 NOTE — Progress Notes (Deleted)
Location:  [facility] Room Number: [place] of Service:  SNF [id number]) Provider:  Granville Lewis, PA-C  Celene Squibb, MD  Patient Care Team: Celene Squibb, MD as PCP - General (Internal Medicine) [name] Cristopher Estimable, MD as Consulting Physician (Gastroenterology)  Extended Emergency Contact Information Primary Emergency Contact: Schoenrock,Gloria [place] Phone: [phone] Relation: Daughter Secondary Emergency Contact: [place] Phone: [phone] Relation: Daughter  Code Status:  DNR Goals of care: Advanced Directive information Advanced Directives 08/18/2018  Does Patient Have a Medical Advance Directive? Yes  Type of Advance Directive Out of facility DNR (pink MOST or yellow form)  Does patient want to make changes to medical advance directive? No - Patient declined  Copy of [place] in Chart? -  Would patient like information on creating a medical advance directive? No - Patient declined  Pre-existing out of facility DNR order (yellow form or pink MOST form) Yellow form placed in chart (order not valid for inpatient use);Pink MOST form placed in chart (order not valid for inpatient use)     Chief Complaint  Patient presents with  . Medical Management of Chronic Issues    CHF  . Best Practice Recommendations    Due for Tdap and Prevnar 13    HPI:  Pt is a 85 y.o. female seen today for medical management of chronic diseases.     Past Medical History:  Diagnosis Date  . Anemia   . Arthritis   . Cancer ([place])    uterine surg only  . CHF (congestive heart failure) ([place])   . Colitis 12/31/2012   presumed C.Diff. patient declined colonoscopy  . Hypotension   . Kidney stone   . Muscle weakness (generalized)   . Osteoarthritis   . Pancreatic atrophy 12/31/2012  . Pneumonia    Past Surgical History:  Procedure Laterality Date  . ABDOMINAL HYSTERECTOMY    .  CHOLECYSTECTOMY    . ESOPHAGOGASTRODUODENOSCOPY N/A 03/03/2013   RMR: Significant gastric ulcer without bleeding stigmata requiring no endoscopic intervention today. Noncritical Schatzki's ring. Small hiatal hernia  . ESOPHAGOGASTRODUODENOSCOPY N/A 08/24/2013   Dr. [name]: Hiatal hernia.  Gastric mucosal scar formation at site of previously noted gastric ulcerations  . JOINT REPLACEMENT    . LEFT KNEE SURGERY AND REVISION    . RIGHT HIP SURGERY      Allergies  Allergen Reactions  . Sulfa Antibiotics Other (See Comments)    Reaction:  Unknown   . Aspirin Other (See Comments)    Reaction:  Burning of pts stomach     Outpatient Encounter Medications as of 08/18/2018  Medication Sig  . acetaminophen (TYLENOL) 500 MG tablet Take 500 mg by mouth every 6 (six) hours as needed for mild pain, moderate pain, fever or headache.   . albuterol (PROAIR HFA) 108 (90 BASE) MCG/ACT inhaler Inhale 2 puffs into the lungs every 6 (six) hours as needed for wheezing or shortness of breath.   Marland Kitchen alum & mag hydroxide-simeth (MAALOX REGULAR STRENGTH) 200-200-20 MG/5ML suspension Take 15 mLs by mouth every 6 (six) hours as needed for indigestion or heartburn.  Roseanne Kaufman Peru-Castor Oil (VENELEX) OINT Apply topically to sacrum and bilateral buttocks every shift and as needed for blanchable erythema  . docusate sodium (COLACE) 100 MG capsule Take 100 mg by mouth daily.   Marland Kitchen morphine (ROXANOL) 20 MG/ML concentrated solution Give 0.25 ml to equal 5 mg by mouth  two times daily and every 2 hours as needed  . NON FORMULARY Diet Type:  Regular  . Nutritional Supplements (ENSURE ENLIVE PO) Take 1 Bottle by mouth 3 (three) times daily between meals.  . pantoprazole (PROTONIX) 40 MG tablet Take 40 mg by mouth every morning.   . polyethylene glycol (MIRALAX / GLYCOLAX) packet Take 17 g by mouth daily.   . Wound Dressings (ALLEVYN ADHESIVE EX) Apply allevyn foam dressing topically to bilateral heels and change every 3 days and as  needed for prevention  . [DISCONTINUED] diclofenac sodium (VOLTAREN) 1 % GEL Apply 2 g topically 4 (four) times daily as needed.   . [DISCONTINUED] morphine (ROXANOL) 20 MG/ML concentrated solution Take 0.25 mLs (5 mg total) by mouth every 2 (two) hours as needed for severe pain. (Patient not taking: Reported on 08/18/2018)   No facility-administered encounter medications on file as of 08/18/2018.     Review of Systems   There is no immunization history on file for this patient. Pertinent  Health Maintenance Due  Topic Date Due  . PNA vac Low Risk Adult (1 of 2 - PCV13) 09/17/2018 (Originally [birth year]/1985)  . INFLUENZA VACCINE  12/11/2018  . DEXA SCAN  Discontinued   No flowsheet data found. Functional Status Survey:    Vitals:   08/18/18 0933  BP: 134/64  Pulse: (!) 59  Resp: 17  Temp: (!) 97.3 F (36.3 C)  Weight: 68 lb 9.6 oz (31.1 kg)  Height: 4\' 5"  (1.346 m)   Body mass index is 17.17 kg/m. Physical Exam  Labs reviewed: Recent Labs    07/13/18 1301 07/14/18 0721 07/19/18 0700 07/26/18 0249  NA 139 139 139 148*  K 3.2* 3.5 2.6* 4.0  CL 107 110 102 112*  CO2 17* 24 26 26   GLUCOSE 65* 120* 85 96  BUN 21 11 19  54*  CREATININE 0.76 0.63 0.53 0.84  CALCIUM 8.8* 8.2* 8.4* 9.2  MG 1.8  --   --   --   PHOS 3.1  --   --   --    Recent Labs    11/08/17 1630 02/25/18 1852 07/10/18 1422  AST 21 22 25   ALT 11 13 14   ALKPHOS 46 52 57  BILITOT 1.1 1.1 0.8  PROT 6.9 7.0 7.0  ALBUMIN 4.5 4.6 4.4   Recent Labs    11/21/17 1231 02/25/18 1852  07/10/18 1422 07/19/18 0730 07/26/18 0249  WBC 11.5* 10.9*   < > 6.3 10.5 14.2*  NEUTROABS 9.1* 9.3*  --  4.1  --   --   HGB 11.3* 12.5   < > 12.3 10.8* 12.3  HCT 33.9* 38.5   < > 38.6 32.9* 39.3  MCV 97.7 102.1*   < > 105.2* 99.4 105.1*  PLT 206 247   < > 219 186 405*   < > = values in this interval not displayed.   Lab Results  Component Value Date   TSH 0.831 07/11/2018   Lab Results  Component Value Date    HGBA1C 5.5 07/11/2018   Lab Results  Component Value Date   CHOL 178 07/11/2018   HDL 67 07/11/2018   LDLCALC 98 07/11/2018   TRIG 65 07/11/2018   CHOLHDL 2.7 07/11/2018    Significant Diagnostic Results in last 30 days:  No results found.  Assessment/Plan There are no diagnoses linked to this encounter.   Family/ staff Communication: ***  Labs/tests ordered:  ***

## 2018-08-19 ENCOUNTER — Other Ambulatory Visit: Payer: Self-pay | Admitting: Internal Medicine

## 2018-08-19 NOTE — Progress Notes (Deleted)
This encounter was created in error - please disregard.

## 2018-09-09 NOTE — Progress Notes (Signed)
This encounter was created in error - please disregard.

## 2018-09-10 ENCOUNTER — Other Ambulatory Visit: Payer: Self-pay | Admitting: Adult Health

## 2018-09-10 ENCOUNTER — Non-Acute Institutional Stay (SKILLED_NURSING_FACILITY): Payer: Medicare HMO | Admitting: Adult Health

## 2018-09-10 ENCOUNTER — Encounter: Payer: Self-pay | Admitting: Adult Health

## 2018-09-10 DIAGNOSIS — F411 Generalized anxiety disorder: Secondary | ICD-10-CM

## 2018-09-10 DIAGNOSIS — K279 Peptic ulcer, site unspecified, unspecified as acute or chronic, without hemorrhage or perforation: Secondary | ICD-10-CM

## 2018-09-10 DIAGNOSIS — I5032 Chronic diastolic (congestive) heart failure: Secondary | ICD-10-CM

## 2018-09-10 DIAGNOSIS — R69 Illness, unspecified: Secondary | ICD-10-CM | POA: Diagnosis not present

## 2018-09-10 MED ORDER — MORPHINE SULFATE (CONCENTRATE) 20 MG/ML PO SOLN: 5.0000 mg | ORAL | 0 refills | Status: DC | PRN

## 2018-09-10 NOTE — Progress Notes (Signed)
Location:   [facility] Room Number: 119 P Place of Service:  SNF (31)   CODE STATUS: DNR  Allergies  Allergen Reactions  . Sulfa Antibiotics Other (See Comments)    Reaction:  Unknown   . Aspirin Other (See Comments)    Reaction:  Burning of pts stomach     Chief Complaint  Patient presents with  . Medical Management of Chronic Issues    Chronic diastolic CHF (congestive heart failure): PUD(peptic ulcer disease) generalized anxiety disorder    HPI:  She is a 85 year old long term resident of this facility being seen for the management of her chronic illnesses; chf; pud; gad. There are no reports of uncontrolled pain; no reports of agitation or anxiety. No reports of fevers present.   Past Medical History:  Diagnosis Date  . Anemia   . Arthritis   . Cancer ([place])    uterine surg only  . CHF (congestive heart failure) ([place])   . Colitis 12/31/2012   presumed C.Diff. patient declined colonoscopy  . Hypotension   . Kidney stone   . Muscle weakness (generalized)   . Osteoarthritis   . Pancreatic atrophy 12/31/2012  . Pneumonia     Past Surgical History:  Procedure Laterality Date  . ABDOMINAL HYSTERECTOMY    . CHOLECYSTECTOMY    . ESOPHAGOGASTRODUODENOSCOPY N/A 03/03/2013   RMR: Significant gastric ulcer without bleeding stigmata requiring no endoscopic intervention today. Noncritical Schatzki's ring. Small hiatal hernia  . ESOPHAGOGASTRODUODENOSCOPY N/A 08/24/2013   Dr. [name]: Hiatal hernia.  Gastric mucosal scar formation at site of previously noted gastric ulcerations  . JOINT REPLACEMENT    . LEFT KNEE SURGERY AND REVISION    . RIGHT HIP SURGERY      Social History   Socioeconomic History  . Marital status: Widowed    Spouse name: Not on file  . Number of children: Not on file  . Years of education: Not on file  . Highest education level: Not on file  Occupational History  . Not on file  Social Needs  . Financial resource  strain: Not on file  . Food insecurity:    Worry: Not on file    Inability: Not on file  . Transportation needs:    Medical: Not on file    Non-medical: Not on file  Tobacco Use  . Smoking status: Former Smoker    Types: Cigarettes    Last attempt to quit: 04/25/1951    Years since quitting: 67.4  . Smokeless tobacco: Never Used  Substance and Sexual Activity  . Alcohol use: No    Alcohol/week: 1.0 standard drinks    Types: 1 Glasses of wine per week  . Drug use: No  . Sexual activity: Never  Lifestyle  . Physical activity:    Days per week: Not on file    Minutes per session: Not on file  . Stress: Not on file  Relationships  . Social connections:    Talks on phone: Not on file    Gets together: Not on file    Attends religious service: Not on file    Active member of club or organization: Not on file    Attends meetings of clubs or organizations: Not on file    Relationship status: Not on file  . Intimate partner violence:    Fear of current or ex partner: Not on file    Emotionally abused: Not on file    Physically abused: Not  on file    Forced sexual activity: Not on file  Other Topics Concern  . Not on file  Social History Narrative  . Not on file   Family History  Problem Relation Age of Onset  . Cancer Mother       VITAL SIGNS Ht 4\' 5"  (1.346 m)   Wt 68 lb 9.6 oz (31.1 kg)   BMI 17.17 kg/m   Outpatient Encounter Medications as of 09/10/2018  Medication Sig  . acetaminophen (TYLENOL) 500 MG tablet Take 500 mg by mouth every 6 (six) hours as needed for mild pain, moderate pain, fever or headache.   . albuterol (PROAIR HFA) 108 (90 BASE) MCG/ACT inhaler Inhale 2 puffs into the lungs every 6 (six) hours as needed for wheezing or shortness of breath.   Marland Kitchen alum & mag hydroxide-simeth (MAALOX REGULAR STRENGTH) 200-200-20 MG/5ML suspension Take 15 mLs by mouth every 6 (six) hours as needed for indigestion or heartburn.  Roseanne Kaufman Peru-Castor Oil (VENELEX) OINT  Apply topically to sacrum and bilateral buttocks every shift and as needed for blanchable erythema  . docusate sodium (COLACE) 100 MG capsule Take 100 mg by mouth daily.   Marland Kitchen morphine (ROXANOL) 20 MG/ML concentrated solution Give 0.25 ml to equal 5 mg by mouth two times daily and every 2 hours as needed  . NON FORMULARY Diet Type:  Regular  . Nutritional Supplements (ENSURE ENLIVE PO) Take 1 Bottle by mouth 3 (three) times daily between meals.  . pantoprazole (PROTONIX) 40 MG tablet Take 40 mg by mouth every morning.   . polyethylene glycol (MIRALAX / GLYCOLAX) packet Take 17 g by mouth daily.   . Wound Dressings (ALLEVYN ADHESIVE EX) Apply allevyn foam dressing topically to bilateral heels and change every 3 days and as needed for prevention   No facility-administered encounter medications on file as of 09/10/2018.      SIGNIFICANT DIAGNOSTIC EXAMS  PREVIOUS:   07-10-18: chest x-ray: No acute abnormality noted.  07-10-18: ct of head: Chronic atrophic and ischemic changes without acute abnormality.  07-11-18: 2-d echo:  1. The left ventricle has normal systolic function with an ejection fraction of 60-65%. The cavity size was normal. Left ventricular diastolic Doppler parameters are indeterminate.  2. The right ventricle has normal systolic function. The cavity was normal. There is no increase in right ventricular wall thickness.  3. Left atrial size was moderately dilated.  4. The mitral valve is normal in structure. Mild thickening of the mitral valve leaflet. There is mild mitral annular calcification present.  5. The tricuspid valve is normal in structure.  6. The aortic valve is tricuspid Mild thickening of the aortic valve.  7. The pulmonic valve was normal in structure.  8. Normal LV function; moderate LAE; mild MR and TR.  07-12-18: ct of head: 1. Atrophy and white matter microvascular disease not changed from prior. 2. No acute findings.  NO NEW EXAMS.   LABS REVIEWED PREVIOUS:    07-10-18: wbc 6.3; hgb 12.3; hct 38.6; mcv 105.2; plt 219; glucose 108; bun 17 creat 0.67; k+ 3.8; na++ 142; ca 9.0 liver normal albumin 4.4 urine culture: no growth urine drug screen neg 07-11-18: chol 178; ldl 98  trig 65 hdl 67; tsh 0.831; hgb a1c 5.5 bit B 12 204  07-13-18: glucose 65; bun 21; creat 0.76; k+ 3.2; an++ 139; ca 8.8 mag 1.8; phos 3.1 07-14-18: glucose 120; bun 11; creat 0.63; k+ 3.5; na++ 139  07-19-18: wbc 10.5; hgb 10.8; hct  32.9; mcv 99.4 plt 186; glucose bun 19; creat 0.53;  85; k+ 2.6; na++ 139 ca 8.4 07-26-18: wbc 14.2; hgb 12.3; hct 39.3; mcv 105.1; plt 405; glucose 96; bun 54; creat 0.84; k+ 4.0; na++ 148; ca 9.2   NO NEW LABS.   Review of Systems  Unable to perform ROS: Dementia (unable to participate )    Physical Exam Constitutional:      General: She is not in acute distress.    Appearance: She is cachectic. She is not diaphoretic.  Neck:     Musculoskeletal: Neck supple.     Thyroid: No thyromegaly.  Cardiovascular:     Rate and Rhythm: Normal rate and regular rhythm.     Pulses: Normal pulses.     Heart sounds: Normal heart sounds.  Pulmonary:     Effort: Pulmonary effort is normal. No respiratory distress.     Breath sounds: Normal breath sounds.  Abdominal:     General: Bowel sounds are normal. There is no distension.     Palpations: Abdomen is soft.     Tenderness: There is no abdominal tenderness.  Musculoskeletal:     Right lower leg: No edema.     Left lower leg: No edema.     Comments: Is able to move all extremities   Lymphadenopathy:     Cervical: No cervical adenopathy.  Skin:    General: Skin is warm and dry.     Comments: Bilateral lower extremities discolored   Neurological:     Mental Status: She is alert. Mental status is at baseline.  Psychiatric:        Mood and Affect: Mood normal.       ASSESSMENT/ PLAN:  TODAY:   1. PUD: is stable will continue protonix 40 mg dai   2. Chronic diastolic heart failure: is presently stable  is compensates will monitor   3. Generalized anxiety disorder: is stable is presently stable is off xanax will monitor   PREVIOUS   4. Vit B 12 deficiency: is stable will continue to monitor   5. Tachy/bradycardia syndrome: is asymptomatic no appropriate for pace maker; will monitor will continue to monitor   6. Chronic constipation: is stable will continue colace daily and miralax daily   7. Protein calorie malnutrition, severe/failure to thrive in adult; is without change weight is 68 (previous 69,  71); pounds; will continue supplements and will monitor   8. Chronic pain syndrome: pain is managed with roxanol 5 mg every 2 hours as needed.   MD is aware of resident's narcotic use and is in agreement with current plan of care. We will attempt to wean resident as apropriate   Ok Edwards NP [facility]  Contact [phone] Monday through Friday 8am- 5pm  After hours call [phone]

## 2018-09-15 ENCOUNTER — Other Ambulatory Visit: Payer: Self-pay | Admitting: Adult Health

## 2018-09-15 MED ORDER — MORPHINE SULFATE (CONCENTRATE) 20 MG/ML PO SOLN: 5.0000 mg | ORAL | 0 refills | Status: DC | PRN

## 2018-10-14 ENCOUNTER — Non-Acute Institutional Stay (SKILLED_NURSING_FACILITY): Payer: Medicare HMO | Admitting: Adult Health

## 2018-10-14 ENCOUNTER — Encounter: Payer: Self-pay | Admitting: Adult Health

## 2018-10-14 DIAGNOSIS — K5909 Other constipation: Secondary | ICD-10-CM

## 2018-10-14 DIAGNOSIS — E43 Unspecified severe protein-calorie malnutrition: Secondary | ICD-10-CM | POA: Diagnosis not present

## 2018-10-14 DIAGNOSIS — I495 Sick sinus syndrome: Secondary | ICD-10-CM | POA: Diagnosis not present

## 2018-10-14 DIAGNOSIS — R627 Adult failure to thrive: Secondary | ICD-10-CM

## 2018-10-14 NOTE — Progress Notes (Signed)
Location:   [place] Room Number: 119 P Place of Service:  SNF (31)   CODE STATUS: DNR  Allergies  Allergen Reactions  . Sulfa Antibiotics Other (See Comments)    Reaction:  Unknown   . Aspirin Other (See Comments)    Reaction:  Burning of pts stomach     Chief Complaint  Patient presents with  . Medical Management of Chronic Issues    Tachy-brady syndrome; chronic constipation; protein calorie malnutrition severe; failure to thrive in adult.     HPI:  She is a 85 year old long term resident of this facility being seen for the management of her chronic illnesses; tachy/brady syndrome; constipation; failure to thrive malnutrition. There are no reports of changes in her appetite; her weight is stable she continues with supplements daily. There are no reports of uncontrolled pain is tolerating roxanol; no reports of anxiety or agitation. There are no reports of fevers present.   Past Medical History:  Diagnosis Date  . Anemia   . Arthritis   . Cancer ([place])    uterine surg only  . CHF (congestive heart failure) ([facility])   . Colitis 12/31/2012   presumed C.Diff. patient declined colonoscopy  . Hypotension   . Kidney stone   . Muscle weakness (generalized)   . Osteoarthritis   . Pancreatic atrophy 12/31/2012  . Pneumonia     Past Surgical History:  Procedure Laterality Date  . ABDOMINAL HYSTERECTOMY    . CHOLECYSTECTOMY    . ESOPHAGOGASTRODUODENOSCOPY N/A 03/03/2013   RMR: Significant gastric ulcer without bleeding stigmata requiring no endoscopic intervention today. Noncritical Schatzki's ring. Small hiatal hernia  . ESOPHAGOGASTRODUODENOSCOPY N/A 08/24/2013   Dr. [name]: Hiatal hernia.  Gastric mucosal scar formation at site of previously noted gastric ulcerations  . JOINT REPLACEMENT    . LEFT KNEE SURGERY AND REVISION    . RIGHT HIP SURGERY      Social History   Socioeconomic History  . Marital status: Widowed    Spouse name: Not on  file  . Number of children: Not on file  . Years of education: Not on file  . Highest education level: Not on file  Occupational History  . Not on file  Social Needs  . Financial resource strain: Not on file  . Food insecurity:    Worry: Not on file    Inability: Not on file  . Transportation needs:    Medical: Not on file    Non-medical: Not on file  Tobacco Use  . Smoking status: Former Smoker    Types: Cigarettes    Last attempt to quit: 04/25/1951    Years since quitting: 67.5  . Smokeless tobacco: Never Used  Substance and Sexual Activity  . Alcohol use: No    Alcohol/week: 1.0 standard drinks    Types: 1 Glasses of wine per week  . Drug use: No  . Sexual activity: Never  Lifestyle  . Physical activity:    Days per week: Not on file    Minutes per session: Not on file  . Stress: Not on file  Relationships  . Social connections:    Talks on phone: Not on file    Gets together: Not on file    Attends religious service: Not on file    Active member of club or organization: Not on file    Attends meetings of clubs or organizations: Not on file    Relationship status: Not on file  .  Intimate partner violence:    Fear of current or ex partner: Not on file    Emotionally abused: Not on file    Physically abused: Not on file    Forced sexual activity: Not on file  Other Topics Concern  . Not on file  Social History Narrative  . Not on file   Family History  Problem Relation Age of Onset  . Cancer Mother       VITAL SIGNS BP 109/65   Pulse 71   Temp 98.4 F (36.9 C)   Resp 18   Ht 4\' 5"  (1.346 m)   Wt 72 lb (32.7 kg)   BMI 18.02 kg/m   Outpatient Encounter Medications as of 10/14/2018  Medication Sig  . acetaminophen (TYLENOL) 500 MG tablet Take 500 mg by mouth every 6 (six) hours as needed for mild pain, moderate pain, fever or headache.   . albuterol (PROAIR HFA) 108 (90 BASE) MCG/ACT inhaler Inhale 2 puffs into the lungs every 6 (six) hours as needed  for wheezing or shortness of breath.   Marland Kitchen alum & mag hydroxide-simeth (MAALOX REGULAR STRENGTH) 200-200-20 MG/5ML suspension Take 15 mLs by mouth every 6 (six) hours as needed for indigestion or heartburn.  Roseanne Kaufman Peru-Castor Oil (VENELEX) OINT Apply topically to sacrum and bilateral buttocks every shift and as needed for blanchable erythema  . docusate sodium (COLACE) 100 MG capsule Take 100 mg by mouth daily.   . magnesium hydroxide (MILK OF MAGNESIA) 400 MG/5ML suspension Take 30 mLs by mouth every 6 (six) hours as needed for mild constipation.  Marland Kitchen morphine (ROXANOL) 20 MG/ML concentrated solution Take 0.25 mLs (5 mg total) by mouth every 2 (two) hours as needed for severe pain. Give  5 mg by mouth two times daily and every 2 hours prn  . NON FORMULARY Diet Type:  Regular  . Nutritional Supplements (ENSURE ENLIVE PO) Take 1 Bottle by mouth 3 (three) times daily between meals.  . pantoprazole (PROTONIX) 40 MG tablet Take 40 mg by mouth every morning.   Vladimir Faster Glycol-Propyl Glycol (SYSTANE) 0.4-0.3 % SOLN Apply 1 drop to eye daily.  . polyethylene glycol (MIRALAX / GLYCOLAX) packet Take 17 g by mouth daily.   . Wound Dressings (ALLEVYN ADHESIVE EX) Apply allevyn foam dressing topically to bilateral heels and change every 3 days and as needed for prevention   No facility-administered encounter medications on file as of 10/14/2018.      SIGNIFICANT DIAGNOSTIC EXAMS  PREVIOUS:   07-10-18: chest x-ray: No acute abnormality noted.  07-10-18: ct of head: Chronic atrophic and ischemic changes without acute abnormality.  07-11-18: 2-d echo:  1. The left ventricle has normal systolic function with an ejection fraction of 60-65%. The cavity size was normal. Left ventricular diastolic Doppler parameters are indeterminate.  2. The right ventricle has normal systolic function. The cavity was normal. There is no increase in right ventricular wall thickness.  3. Left atrial size was moderately dilated.   4. The mitral valve is normal in structure. Mild thickening of the mitral valve leaflet. There is mild mitral annular calcification present.  5. The tricuspid valve is normal in structure.  6. The aortic valve is tricuspid Mild thickening of the aortic valve.  7. The pulmonic valve was normal in structure.  8. Normal LV function; moderate LAE; mild MR and TR.  07-12-18: ct of head: 1. Atrophy and white matter microvascular disease not changed from prior. 2. No acute findings.  NO NEW  EXAMS.   LABS REVIEWED PREVIOUS:   07-10-18: wbc 6.3; hgb 12.3; hct 38.6; mcv 105.2; plt 219; glucose 108; bun 17 creat 0.67; k+ 3.8; na++ 142; ca 9.0 liver normal albumin 4.4 urine culture: no growth urine drug screen neg 07-11-18: chol 178; ldl 98  trig 65 hdl 67; tsh 0.831; hgb a1c 5.5 bit B 12 204  07-13-18: glucose 65; bun 21; creat 0.76; k+ 3.2; an++ 139; ca 8.8 mag 1.8; phos 3.1 07-14-18: glucose 120; bun 11; creat 0.63; k+ 3.5; na++ 139  07-19-18: wbc 10.5; hgb 10.8; hct 32.9; mcv 99.4 plt 186; glucose bun 19; creat 0.53;  85; k+ 2.6; na++ 139 ca 8.4 07-26-18: wbc 14.2; hgb 12.3; hct 39.3; mcv 105.1; plt 405; glucose 96; bun 54; creat 0.84; k+ 4.0; na++ 148; ca 9.2   NO NEW LABS.   Review of Systems  Unable to perform ROS: Dementia (unable to participate )    Physical Exam Constitutional:      General: She is not in acute distress.    Appearance: She is cachectic. She is not diaphoretic.  Neck:     Musculoskeletal: Neck supple.     Thyroid: No thyromegaly.  Cardiovascular:     Rate and Rhythm: Normal rate and regular rhythm.     Pulses: Normal pulses.     Heart sounds: Normal heart sounds.  Pulmonary:     Effort: Pulmonary effort is normal. No respiratory distress.     Breath sounds: Normal breath sounds.  Abdominal:     General: Bowel sounds are normal. There is no distension.     Palpations: Abdomen is soft.     Tenderness: There is no abdominal tenderness.  Musculoskeletal:     Right lower  leg: No edema.     Left lower leg: No edema.     Comments: Is able to move all extremities   Lymphadenopathy:     Cervical: No cervical adenopathy.  Skin:    General: Skin is warm and dry.     Comments: Bilateral lower extremities discolored   Neurological:     Mental Status: She is alert. Mental status is at baseline.  Psychiatric:        Mood and Affect: Mood normal.      ASSESSMENT/ PLAN:  TODAY:   1. Tachy/bradycardia syndrome: is without symptoms; not appropriate for a pace maker; will monitor   2. Chronic constipation: is stable will continue colace daily and miralax daily   3. Protein calorie malnutrition, severe/failure to thrive in adult: is without change; weight is 72 (previous 68,69,71) pounds; will continue supplements as directed and will monitor    PREVIOUS   4. Vit B 12 deficiency: is stable will continue to monitor   5. Chronic pain syndrome: pain is managed with roxanol 5 mg twice daily and  every 2 hours as needed.   6. PUD: is stable will continue protonix 40 mg daily   7. Chronic diastolic heart failure: is presently stable is compensated will monitor   8. Generalized anxiety disorder: is stable is presently stable is off xanax will monitor   9. Dementia without behavioral disturbance: is without changes; weight is 72 pounds will continue to monitor her status.    MD is aware of resident's narcotic use and is in agreement with current plan of care. We will attempt to wean resident as apropriate   Ok Edwards NP [facility]  Contact [phone] Monday through Friday 8am- 5pm  After hours call [phone]

## 2018-10-19 DIAGNOSIS — I495 Sick sinus syndrome: Secondary | ICD-10-CM | POA: Diagnosis not present

## 2018-10-19 DIAGNOSIS — M419 Scoliosis, unspecified: Secondary | ICD-10-CM | POA: Diagnosis not present

## 2018-10-19 DIAGNOSIS — K279 Peptic ulcer, site unspecified, unspecified as acute or chronic, without hemorrhage or perforation: Secondary | ICD-10-CM | POA: Diagnosis not present

## 2018-10-19 DIAGNOSIS — M159 Polyosteoarthritis, unspecified: Secondary | ICD-10-CM | POA: Diagnosis not present

## 2018-10-19 DIAGNOSIS — M6281 Muscle weakness (generalized): Secondary | ICD-10-CM | POA: Diagnosis not present

## 2018-10-19 DIAGNOSIS — G9340 Encephalopathy, unspecified: Secondary | ICD-10-CM | POA: Diagnosis not present

## 2018-10-19 DIAGNOSIS — R279 Unspecified lack of coordination: Secondary | ICD-10-CM | POA: Diagnosis not present

## 2018-10-19 DIAGNOSIS — R627 Adult failure to thrive: Secondary | ICD-10-CM | POA: Diagnosis not present

## 2018-10-19 DIAGNOSIS — R69 Illness, unspecified: Secondary | ICD-10-CM | POA: Diagnosis not present

## 2018-10-19 DIAGNOSIS — I5032 Chronic diastolic (congestive) heart failure: Secondary | ICD-10-CM | POA: Diagnosis not present

## 2018-10-26 ENCOUNTER — Other Ambulatory Visit: Payer: Self-pay | Admitting: Adult Health

## 2018-10-26 MED ORDER — MORPHINE SULFATE (CONCENTRATE) 20 MG/ML PO SOLN: 5.0000 mg | ORAL | 0 refills | Status: DC | PRN

## 2018-11-03 ENCOUNTER — Non-Acute Institutional Stay (SKILLED_NURSING_FACILITY): Payer: Medicare HMO | Admitting: Adult Health

## 2018-11-03 ENCOUNTER — Encounter: Payer: Self-pay | Admitting: Adult Health

## 2018-11-03 DIAGNOSIS — R627 Adult failure to thrive: Secondary | ICD-10-CM | POA: Diagnosis not present

## 2018-11-03 DIAGNOSIS — E43 Unspecified severe protein-calorie malnutrition: Secondary | ICD-10-CM | POA: Diagnosis not present

## 2018-11-03 DIAGNOSIS — R69 Illness, unspecified: Secondary | ICD-10-CM | POA: Diagnosis not present

## 2018-11-03 DIAGNOSIS — G894 Chronic pain syndrome: Secondary | ICD-10-CM | POA: Diagnosis not present

## 2018-11-03 DIAGNOSIS — F0390 Unspecified dementia without behavioral disturbance: Secondary | ICD-10-CM | POA: Diagnosis not present

## 2018-11-03 NOTE — Progress Notes (Signed)
Location:   [facility] Room Number: 119 P Place of Service:  SNF (31)   CODE STATUS: DNR  Allergies  Allergen Reactions  . Sulfa Antibiotics Other (See Comments)    Reaction:  Unknown   . Aspirin Other (See Comments)    Reaction:  Burning of pts stomach     Chief Complaint  Patient presents with  . Acute Visit    Care Plan Meeting    HPI:  We have come together for her routine care plan meeting; there is family present via phone. BIMS 0 depression 0. The focus of her care is comfort. She does get out of bed daily. Her weight is stable. Her pain is presently managed. There are no reports of changes in her appetite.    Past Medical History:  Diagnosis Date  . Anemia   . Arthritis   . Cancer ([place])    uterine surg only  . CHF (congestive heart failure) ([place])   . Colitis 12/31/2012   presumed C.Diff. patient declined colonoscopy  . Hypotension   . Kidney stone   . Muscle weakness (generalized)   . Osteoarthritis   . Pancreatic atrophy 12/31/2012  . Pneumonia     Past Surgical History:  Procedure Laterality Date  . ABDOMINAL HYSTERECTOMY    . CHOLECYSTECTOMY    . ESOPHAGOGASTRODUODENOSCOPY N/A 03/03/2013   RMR: Significant gastric ulcer without bleeding stigmata requiring no endoscopic intervention today. Noncritical Schatzki's ring. Small hiatal hernia  . ESOPHAGOGASTRODUODENOSCOPY N/A 08/24/2013   Dr. [name]: Hiatal hernia.  Gastric mucosal scar formation at site of previously noted gastric ulcerations  . JOINT REPLACEMENT    . LEFT KNEE SURGERY AND REVISION    . RIGHT HIP SURGERY      Social History   Socioeconomic History  . Marital status: Widowed    Spouse name: Not on file  . Number of children: Not on file  . Years of education: Not on file  . Highest education level: Not on file  Occupational History  . Not on file  Social Needs  . Financial resource strain: Not on file  . Food insecurity    Worry: Not on file   Inability: Not on file  . Transportation needs    Medical: Not on file    Non-medical: Not on file  Tobacco Use  . Smoking status: Former Smoker    Types: Cigarettes    Quit date: 04/25/1951    Years since quitting: 67.5  . Smokeless tobacco: Never Used  Substance and Sexual Activity  . Alcohol use: No    Alcohol/week: 1.0 standard drinks    Types: 1 Glasses of wine per week  . Drug use: No  . Sexual activity: Never  Lifestyle  . Physical activity    Days per week: Not on file    Minutes per session: Not on file  . Stress: Not on file  Relationships  . Social Herbalist on phone: Not on file    Gets together: Not on file    Attends religious service: Not on file    Active member of club or organization: Not on file    Attends meetings of clubs or organizations: Not on file    Relationship status: Not on file  . Intimate partner violence    Fear of current or ex partner: Not on file    Emotionally abused: Not on file    Physically abused: Not on file  Forced sexual activity: Not on file  Other Topics Concern  . Not on file  Social History Narrative  . Not on file   Family History  Problem Relation Age of Onset  . Cancer Mother       VITAL SIGNS Ht 4\' 5"  (1.346 m)   Wt 72 lb (32.7 kg)   BMI 18.02 kg/m   Outpatient Encounter Medications as of 11/03/2018  Medication Sig  . acetaminophen (TYLENOL) 500 MG tablet Take 500 mg by mouth every 6 (six) hours as needed for mild pain, moderate pain, fever or headache.   . albuterol (PROAIR HFA) 108 (90 BASE) MCG/ACT inhaler Inhale 2 puffs into the lungs every 6 (six) hours as needed for wheezing or shortness of breath.   Marland Kitchen alum & mag hydroxide-simeth (MAALOX REGULAR STRENGTH) 200-200-20 MG/5ML suspension Take 15 mLs by mouth every 6 (six) hours as needed for indigestion or heartburn.  Roseanne Kaufman Peru-Castor Oil (VENELEX) OINT Apply topically to sacrum and bilateral buttocks every shift and as needed for blanchable  erythema  . docusate sodium (COLACE) 100 MG capsule Take 100 mg by mouth daily.   . magnesium hydroxide (MILK OF MAGNESIA) 400 MG/5ML suspension Take 30 mLs by mouth every 6 (six) hours as needed for mild constipation.  Marland Kitchen morphine (ROXANOL) 20 MG/ML concentrated solution Take 0.25 mLs (5 mg total) by mouth every 2 (two) hours as needed for severe pain. Give  5 mg by mouth two times daily and every 2 hours prn  . NON FORMULARY Diet Type:  Regular  . Nutritional Supplements (ENSURE ENLIVE PO) Take 1 Bottle by mouth 3 (three) times daily between meals.  . pantoprazole (PROTONIX) 40 MG tablet Take 40 mg by mouth every morning.   Vladimir Faster Glycol-Propyl Glycol (SYSTANE) 0.4-0.3 % SOLN Apply 1 drop to eye daily.  . polyethylene glycol (MIRALAX / GLYCOLAX) packet Take 17 g by mouth daily.   . Wound Dressings (ALLEVYN ADHESIVE EX) Apply allevyn foam dressing topically to bilateral heels and change every 3 days and as needed for prevention   No facility-administered encounter medications on file as of 11/03/2018.      SIGNIFICANT DIAGNOSTIC EXAMS  PREVIOUS:   07-10-18: chest x-ray: No acute abnormality noted.  07-10-18: ct of head: Chronic atrophic and ischemic changes without acute abnormality.  07-11-18: 2-d echo:  1. The left ventricle has normal systolic function with an ejection fraction of 60-65%. The cavity size was normal. Left ventricular diastolic Doppler parameters are indeterminate.  2. The right ventricle has normal systolic function. The cavity was normal. There is no increase in right ventricular wall thickness.  3. Left atrial size was moderately dilated.  4. The mitral valve is normal in structure. Mild thickening of the mitral valve leaflet. There is mild mitral annular calcification present.  5. The tricuspid valve is normal in structure.  6. The aortic valve is tricuspid Mild thickening of the aortic valve.  7. The pulmonic valve was normal in structure.  8. Normal LV function;  moderate LAE; mild MR and TR.  07-12-18: ct of head: 1. Atrophy and white matter microvascular disease not changed from prior. 2. No acute findings.  NO NEW EXAMS.   LABS REVIEWED PREVIOUS:   07-10-18: wbc 6.3; hgb 12.3; hct 38.6; mcv 105.2; plt 219; glucose 108; bun 17 creat 0.67; k+ 3.8; na++ 142; ca 9.0 liver normal albumin 4.4 urine culture: no growth urine drug screen neg 07-11-18: chol 178; ldl 98  trig 65 hdl 67;  tsh 0.831; hgb a1c 5.5 bit B 12 204  07-13-18: glucose 65; bun 21; creat 0.76; k+ 3.2; an++ 139; ca 8.8 mag 1.8; phos 3.1 07-14-18: glucose 120; bun 11; creat 0.63; k+ 3.5; na++ 139  07-19-18: wbc 10.5; hgb 10.8; hct 32.9; mcv 99.4 plt 186; glucose bun 19; creat 0.53;  85; k+ 2.6; na++ 139 ca 8.4 07-26-18: wbc 14.2; hgb 12.3; hct 39.3; mcv 105.1; plt 405; glucose 96; bun 54; creat 0.84; k+ 4.0; na++ 148; ca 9.2   NO NEW LABS.   Review of Systems  Unable to perform ROS: Dementia (unable to participate )     Physical Exam Constitutional:      General: She is not in acute distress.    Appearance: She is cachectic. She is not diaphoretic.  Neck:     Musculoskeletal: Neck supple.     Thyroid: No thyromegaly.  Cardiovascular:     Rate and Rhythm: Normal rate and regular rhythm.     Pulses: Normal pulses.     Heart sounds: Normal heart sounds.  Pulmonary:     Effort: Pulmonary effort is normal. No respiratory distress.     Breath sounds: Normal breath sounds.  Abdominal:     General: Bowel sounds are normal. There is no distension.     Palpations: Abdomen is soft.     Tenderness: There is no abdominal tenderness.  Musculoskeletal: Normal range of motion.     Right lower leg: No edema.     Left lower leg: No edema.  Lymphadenopathy:     Cervical: No cervical adenopathy.  Skin:    General: Skin is warm and dry.     Comments: Bilateral lower extremities discolored   Neurological:     Mental Status: She is alert. Mental status is at baseline.  Psychiatric:        Mood  and Affect: Mood normal.      ASSESSMENT/ PLAN:  TODAY:   1. Protein calorie malnutrition, severe/failure to thrive in adult 2. Chronic pain syndrome 3. Dementia without behavioral disturbance.  Will continue current medications Will continue current plan of care Goal of her is for comfort Will continue to monitor her status.  .    MD is aware of resident's narcotic use and is in agreement with current plan of care. We will attempt to wean resident as apropriate   Ok Edwards NP [facility]  Contact [phone] Monday through Friday 8am- 5pm  After hours call [phone]

## 2018-11-08 ENCOUNTER — Encounter: Payer: Self-pay | Admitting: Adult Health

## 2018-11-08 NOTE — Progress Notes (Signed)
Location:    [place] Room Number: 119/P Place of Service:  SNF (31)   CODE STATUS: DNR  Allergies  Allergen Reactions  . Sulfa Antibiotics Other (See Comments)    Reaction:  Unknown   . Aspirin Other (See Comments)    Reaction:  Burning of pts stomach     Chief Complaint  Patient presents with  . Acute Visit    F/U Fall    HPI:    Past Medical History:  Diagnosis Date  . Anemia   . Arthritis   . Cancer ([place])    uterine surg only  . CHF (congestive heart failure) ([place])   . Colitis 12/31/2012   presumed C.Diff. patient declined colonoscopy  . Hypotension   . Kidney stone   . Muscle weakness (generalized)   . Osteoarthritis   . Pancreatic atrophy 12/31/2012  . Pneumonia     Past Surgical History:  Procedure Laterality Date  . ABDOMINAL HYSTERECTOMY    . CHOLECYSTECTOMY    . ESOPHAGOGASTRODUODENOSCOPY N/A 03/03/2013   RMR: Significant gastric ulcer without bleeding stigmata requiring no endoscopic intervention today. Noncritical Schatzki's ring. Small hiatal hernia  . ESOPHAGOGASTRODUODENOSCOPY N/A 08/24/2013   Dr. [name]: Hiatal hernia.  Gastric mucosal scar formation at site of previously noted gastric ulcerations  . JOINT REPLACEMENT    . LEFT KNEE SURGERY AND REVISION    . RIGHT HIP SURGERY      Social History   Socioeconomic History  . Marital status: Widowed    Spouse name: Not on file  . Number of children: Not on file  . Years of education: Not on file  . Highest education level: Not on file  Occupational History  . Not on file  Social Needs  . Financial resource strain: Not on file  . Food insecurity    Worry: Not on file    Inability: Not on file  . Transportation needs    Medical: Not on file    Non-medical: Not on file  Tobacco Use  . Smoking status: Former Smoker    Types: Cigarettes    Quit date: 04/25/1951    Years since quitting: 67.5  . Smokeless tobacco: Never Used  Substance and Sexual Activity  .  Alcohol use: No    Alcohol/week: 1.0 standard drinks    Types: 1 Glasses of wine per week  . Drug use: No  . Sexual activity: Never  Lifestyle  . Physical activity    Days per week: Not on file    Minutes per session: Not on file  . Stress: Not on file  Relationships  . Social Herbalist on phone: Not on file    Gets together: Not on file    Attends religious service: Not on file    Active member of club or organization: Not on file    Attends meetings of clubs or organizations: Not on file    Relationship status: Not on file  . Intimate partner violence    Fear of current or ex partner: Not on file    Emotionally abused: Not on file    Physically abused: Not on file    Forced sexual activity: Not on file  Other Topics Concern  . Not on file  Social History Narrative  . Not on file   Family History  Problem Relation Age of Onset  . Cancer Mother       VITAL SIGNS BP (!) 97/55   Pulse 74  Temp 98.6 F (37 C) (Oral)   Resp 16   Ht 4\' 5"  (1.346 m)   Wt 72 lb (32.7 kg)   SpO2 93%   BMI 18.02 kg/m   Outpatient Encounter Medications as of 11/08/2018  Medication Sig  . acetaminophen (TYLENOL) 500 MG tablet Take 500 mg by mouth every 6 (six) hours as needed for mild pain, moderate pain, fever or headache.   . albuterol (PROAIR HFA) 108 (90 BASE) MCG/ACT inhaler Inhale 2 puffs into the lungs every 6 (six) hours as needed for wheezing or shortness of breath.   Marland Kitchen alum & mag hydroxide-simeth (MAALOX REGULAR STRENGTH) 200-200-20 MG/5ML suspension Take 15 mLs by mouth every 6 (six) hours as needed for indigestion or heartburn.  Roseanne Kaufman Peru-Castor Oil (VENELEX) OINT Apply topically to sacrum and bilateral buttocks every shift and as needed for blanchable erythema  . docusate sodium (COLACE) 100 MG capsule Take 100 mg by mouth daily.   . magnesium hydroxide (MILK OF MAGNESIA) 400 MG/5ML suspension Take 30 mLs by mouth as needed for mild constipation.   Marland Kitchen morphine  (ROXANOL) 20 MG/ML concentrated solution Take 5 mg by mouth 2 (two) times daily.  . NON FORMULARY Diet Type:  Regular  . Nutritional Supplements (ENSURE ENLIVE PO) Take 1 Bottle by mouth 3 (three) times daily between meals.  . pantoprazole (PROTONIX) 40 MG tablet Take 40 mg by mouth every morning.   Vladimir Faster Glycol-Propyl Glycol (SYSTANE) 0.4-0.3 % SOLN Place 1 drop into both eyes daily.   . polyethylene glycol (MIRALAX / GLYCOLAX) packet Take 17 g by mouth daily.   . Wound Dressings (ALLEVYN ADHESIVE EX) Apply allevyn foam dressing topically to bilateral heels and change every 3 days and as needed for prevention  . [DISCONTINUED] docusate (COLACE) 50 MG/5ML liquid Take 50 mg by mouth daily.  . [DISCONTINUED] morphine (ROXANOL) 20 MG/ML concentrated solution Take 0.25 mLs (5 mg total) by mouth every 2 (two) hours as needed for severe pain. Give  5 mg by mouth two times daily and every 2 hours prn (Patient taking differently: Take 5 mg by mouth 2 (two) times daily. Give  5 mg by mouth two times daily and every 2 hours prn)   No facility-administered encounter medications on file as of 11/08/2018.      SIGNIFICANT DIAGNOSTIC EXAMS       ASSESSMENT/ PLAN:    MD is aware of resident's narcotic use and is in agreement with current plan of care. We will attempt to wean resident as apropriate   Ok Edwards NP [facility]  Contact [phone] Monday through Friday 8am- 5pm  After hours call [phone]

## 2018-11-09 NOTE — Progress Notes (Signed)
This encounter was created in error - please disregard.

## 2018-11-11 ENCOUNTER — Encounter: Payer: Self-pay | Admitting: Adult Health

## 2018-11-11 ENCOUNTER — Non-Acute Institutional Stay (SKILLED_NURSING_FACILITY): Payer: Medicare HMO | Admitting: Adult Health

## 2018-11-11 DIAGNOSIS — I5032 Chronic diastolic (congestive) heart failure: Secondary | ICD-10-CM | POA: Diagnosis not present

## 2018-11-11 DIAGNOSIS — K279 Peptic ulcer, site unspecified, unspecified as acute or chronic, without hemorrhage or perforation: Secondary | ICD-10-CM | POA: Diagnosis not present

## 2018-11-11 DIAGNOSIS — G894 Chronic pain syndrome: Secondary | ICD-10-CM | POA: Diagnosis not present

## 2018-11-11 NOTE — Progress Notes (Signed)
Location:    [facility] Room Number: 119/P Place of Service:  SNF (31)   CODE STATUS: DNR  Allergies  Allergen Reactions  . Sulfa Antibiotics Other (See Comments)    Reaction:  Unknown   . Aspirin Other (See Comments)    Reaction:  Burning of pts stomach     Chief Complaint  Patient presents with  . Medical Management of Chronic Issues       Chronic pain syndrome:  PUD: Chronic diastolic heart failure:     HPI:  She is a 85 year old long term resident of this facility being seen for the management of her chronic illnesses; pain; pud; chf. There are no reports of changes in appetite; her weight is stable. There are no reports of cough or shortness of breath. No reports of uncontrolled pain.   Past Medical History:  Diagnosis Date  . Anemia   . Arthritis   . Cancer ([place])    uterine surg only  . CHF (congestive heart failure) ([place])   . Colitis 12/31/2012   presumed C.Diff. patient declined colonoscopy  . Hypotension   . Kidney stone   . Muscle weakness (generalized)   . Osteoarthritis   . Pancreatic atrophy 12/31/2012  . Pneumonia     Past Surgical History:  Procedure Laterality Date  . ABDOMINAL HYSTERECTOMY    . CHOLECYSTECTOMY    . ESOPHAGOGASTRODUODENOSCOPY N/A 03/03/2013   RMR: Significant gastric ulcer without bleeding stigmata requiring no endoscopic intervention today. Noncritical Schatzki's ring. Small hiatal hernia  . ESOPHAGOGASTRODUODENOSCOPY N/A 08/24/2013   Dr. [name]: Hiatal hernia.  Gastric mucosal scar formation at site of previously noted gastric ulcerations  . JOINT REPLACEMENT    . LEFT KNEE SURGERY AND REVISION    . RIGHT HIP SURGERY      Social History   Socioeconomic History  . Marital status: Widowed    Spouse name: Not on file  . Number of children: Not on file  . Years of education: Not on file  . Highest education level: Not on file  Occupational History  . Not on file  Social Needs  . Financial resource  strain: Not on file  . Food insecurity    Worry: Not on file    Inability: Not on file  . Transportation needs    Medical: Not on file    Non-medical: Not on file  Tobacco Use  . Smoking status: Former Smoker    Types: Cigarettes    Quit date: 04/25/1951    Years since quitting: 67.5  . Smokeless tobacco: Never Used  Substance and Sexual Activity  . Alcohol use: No    Alcohol/week: 1.0 standard drinks    Types: 1 Glasses of wine per week  . Drug use: No  . Sexual activity: Never  Lifestyle  . Physical activity    Days per week: Not on file    Minutes per session: Not on file  . Stress: Not on file  Relationships  . Social Herbalist on phone: Not on file    Gets together: Not on file    Attends religious service: Not on file    Active member of club or organization: Not on file    Attends meetings of clubs or organizations: Not on file    Relationship status: Not on file  . Intimate partner violence    Fear of current or ex partner: Not on file    Emotionally abused: Not on  file    Physically abused: Not on file    Forced sexual activity: Not on file  Other Topics Concern  . Not on file  Social History Narrative  . Not on file   Family History  Problem Relation Age of Onset  . Cancer Mother       VITAL SIGNS BP (!) 97/55   Pulse 74   Temp 98.6 F (37 C) (Oral)   Resp 16   Ht 4\' 5"  (1.346 m)   Wt 72 lb (32.7 kg)   SpO2 93%   BMI 18.02 kg/m   Outpatient Encounter Medications as of 11/11/2018  Medication Sig  . acetaminophen (TYLENOL) 500 MG tablet Take 500 mg by mouth every 6 (six) hours as needed for mild pain, moderate pain, fever or headache.   . albuterol (PROAIR HFA) 108 (90 BASE) MCG/ACT inhaler Inhale 2 puffs into the lungs every 6 (six) hours as needed for wheezing or shortness of breath.   Marland Kitchen alum & mag hydroxide-simeth (MAALOX REGULAR STRENGTH) 200-200-20 MG/5ML suspension Take 15 mLs by mouth every 6 (six) hours as needed for  indigestion or heartburn.  Roseanne Kaufman [place]-Castor Oil (VENELEX) OINT Apply topically to sacrum and bilateral buttocks every shift and as needed for blanchable erythema  . docusate sodium (COLACE) 100 MG capsule Take 100 mg by mouth daily.   . magnesium hydroxide (MILK OF MAGNESIA) 400 MG/5ML suspension Take 30 mLs by mouth as needed for mild constipation.   Marland Kitchen morphine (ROXANOL) 20 MG/ML concentrated solution Take 5 mg by mouth 2 (two) times daily.  Marland Kitchen morphine (ROXANOL) 20 MG/ML concentrated solution Take 5 mg by mouth every 2 (two) hours as needed for severe pain.  . NON FORMULARY Diet Type:  Regular  . Nutritional Supplements (ENSURE ENLIVE PO) Take 1 Bottle by mouth 3 (three) times daily between meals.  . pantoprazole (PROTONIX) 40 MG tablet Take 40 mg by mouth every morning.   Vladimir Faster Glycol-Propyl Glycol (SYSTANE) 0.4-0.3 % SOLN Place 1 drop into both eyes 2 (two) times daily between meals.   . polyethylene glycol (MIRALAX / GLYCOLAX) packet Take 17 g by mouth daily.   . Wound Dressings (ALLEVYN ADHESIVE EX) Apply allevyn foam dressing topically to bilateral heels and change every 3 days and as needed for prevention   No facility-administered encounter medications on file as of 11/11/2018.      SIGNIFICANT DIAGNOSTIC EXAMS   PREVIOUS:   07-10-18: chest x-ray: No acute abnormality noted.  07-10-18: ct of head: Chronic atrophic and ischemic changes without acute abnormality.  07-11-18: 2-d echo:  1. The left ventricle has normal systolic function with an ejection fraction of 60-65%. The cavity size was normal. Left ventricular diastolic Doppler parameters are indeterminate.  2. The right ventricle has normal systolic function. The cavity was normal. There is no increase in right ventricular wall thickness.  3. Left atrial size was moderately dilated.  4. The mitral valve is normal in structure. Mild thickening of the mitral valve leaflet. There is mild mitral annular calcification present.   5. The tricuspid valve is normal in structure.  6. The aortic valve is tricuspid Mild thickening of the aortic valve.  7. The pulmonic valve was normal in structure.  8. Normal LV function; moderate LAE; mild MR and TR.  07-12-18: ct of head: 1. Atrophy and white matter microvascular disease not changed from prior. 2. No acute findings.  NO NEW EXAMS.   LABS REVIEWED PREVIOUS:   07-10-18: wbc 6.3;  hgb 12.3; hct 38.6; mcv 105.2; plt 219; glucose 108; bun 17 creat 0.67; k+ 3.8; na++ 142; ca 9.0 liver normal albumin 4.4 urine culture: no growth urine drug screen neg 07-11-18: chol 178; ldl 98  trig 65 hdl 67; tsh 0.831; hgb a1c 5.5 bit B 12 204  07-13-18: glucose 65; bun 21; creat 0.76; k+ 3.2; an++ 139; ca 8.8 mag 1.8; phos 3.1 07-14-18: glucose 120; bun 11; creat 0.63; k+ 3.5; na++ 139  07-19-18: wbc 10.5; hgb 10.8; hct 32.9; mcv 99.4 plt 186; glucose bun 19; creat 0.53;  85; k+ 2.6; na++ 139 ca 8.4 07-26-18: wbc 14.2; hgb 12.3; hct 39.3; mcv 105.1; plt 405; glucose 96; bun 54; creat 0.84; k+ 4.0; na++ 148; ca 9.2   NO NEW LABS.    Review of Systems  Unable to perform ROS: Dementia (unable to participate )     Physical Exam Constitutional:      General: She is not in acute distress.    Appearance: She is cachectic. She is not diaphoretic.  Neck:     Musculoskeletal: Neck supple.     Thyroid: No thyromegaly.  Cardiovascular:     Rate and Rhythm: Normal rate and regular rhythm.     Pulses: Normal pulses.     Heart sounds: Normal heart sounds.  Pulmonary:     Effort: Pulmonary effort is normal. No respiratory distress.     Breath sounds: Normal breath sounds.  Abdominal:     General: Bowel sounds are normal. There is no distension.     Palpations: Abdomen is soft.     Tenderness: There is no abdominal tenderness.  Musculoskeletal: Normal range of motion.     Right lower leg: No edema.     Left lower leg: No edema.  Lymphadenopathy:     Cervical: No cervical adenopathy.  Skin:     General: Skin is warm and dry.     Comments: Bilateral lower extremities discolored   Neurological:     Mental Status: She is alert. Mental status is at baseline.  Psychiatric:        Mood and Affect: Mood normal.        ASSESSMENT/ PLAN:  TODAY:   1. Chronic pain syndrome: is stable will continue roxanol 5 mg twice daily and every 2 hours as needed  2. PUD: is stable will continue protonix 40 mg daily   3. Chronic diastolic heart failure: is presently stable is compensated will monitor   PREVIOUS   4. Vit B 12 deficiency: is stable will continue to monitor   5. Generalized anxiety disorder: is stable is presently stable is off xanax will monitor   6. Dementia without behavioral disturbance: is without changes; weight is 72 pounds will continue to monitor her status.   7. Tachy/bradycardia syndrome: is without symptoms; not appropriate for a pace maker; will monitor   8. Chronic constipation: is stable will continue colace daily and miralax daily   9. Protein calorie malnutrition, severe/failure to thrive in adult: is without change; weight is 72 (previous 68,69,71) pounds; will continue supplements as directed and will monitor     MD is aware of resident's narcotic use and is in agreement with current plan of care. We will attempt to wean resident as apropriate   Ok Edwards NP [facility]  Contact [phone] Monday through Friday 8am- 5pm  After hours call [phone]

## 2018-12-14 ENCOUNTER — Encounter: Payer: Self-pay | Admitting: Adult Health

## 2018-12-14 ENCOUNTER — Non-Acute Institutional Stay (SKILLED_NURSING_FACILITY): Payer: Medicare HMO | Admitting: Adult Health

## 2018-12-14 DIAGNOSIS — I495 Sick sinus syndrome: Secondary | ICD-10-CM | POA: Diagnosis not present

## 2018-12-14 DIAGNOSIS — F0390 Unspecified dementia without behavioral disturbance: Secondary | ICD-10-CM | POA: Diagnosis not present

## 2018-12-14 DIAGNOSIS — Z20828 Contact with and (suspected) exposure to other viral communicable diseases: Secondary | ICD-10-CM | POA: Diagnosis not present

## 2018-12-14 DIAGNOSIS — F411 Generalized anxiety disorder: Secondary | ICD-10-CM

## 2018-12-14 DIAGNOSIS — R69 Illness, unspecified: Secondary | ICD-10-CM | POA: Diagnosis not present

## 2018-12-14 NOTE — Progress Notes (Signed)
Location:   [place] Room Number: 119 P Place of Service:  SNF (31)   CODE STATUS: DNR  Allergies  Allergen Reactions  . Sulfa Antibiotics Other (See Comments)    Reaction:  Unknown   . Aspirin Other (See Comments)    Reaction:  Burning of pts stomach     Chief Complaint  Patient presents with  . Medical Management of Chronic Issues        Generalized anxiety disorder:   Dementia without behavioral disturbance:  Tachy/brady syndrome:    HPI:  She is a 85 year old long term resident of this facility being seen for the management of her chronic illnesses; anxiety; dementia; tachy/brady syndrome. There are no reports of uncontrolled pain; no reports of agitation; no reports of insomnia.   Past Medical History:  Diagnosis Date  . Anemia   . Arthritis   . Cancer ([place])    uterine surg only  . CHF (congestive heart failure) ([place])   . Colitis 12/31/2012   presumed C.Diff. patient declined colonoscopy  . Hypotension   . Kidney stone   . Muscle weakness (generalized)   . Osteoarthritis   . Pancreatic atrophy 12/31/2012  . Pneumonia     Past Surgical History:  Procedure Laterality Date  . ABDOMINAL HYSTERECTOMY    . CHOLECYSTECTOMY    . ESOPHAGOGASTRODUODENOSCOPY N/A 03/03/2013   RMR: Significant gastric ulcer without bleeding stigmata requiring no endoscopic intervention today. Noncritical Schatzki's ring. Small hiatal hernia  . ESOPHAGOGASTRODUODENOSCOPY N/A 08/24/2013   Dr. [name]: Hiatal hernia.  Gastric mucosal scar formation at site of previously noted gastric ulcerations  . JOINT REPLACEMENT    . LEFT KNEE SURGERY AND REVISION    . RIGHT HIP SURGERY      Social History   Socioeconomic History  . Marital status: Widowed    Spouse name: Not on file  . Number of children: Not on file  . Years of education: Not on file  . Highest education level: Not on file  Occupational History  . Not on file  Social Needs  . Financial resource  strain: Not on file  . Food insecurity    Worry: Not on file    Inability: Not on file  . Transportation needs    Medical: Not on file    Non-medical: Not on file  Tobacco Use  . Smoking status: Former Smoker    Types: Cigarettes    Quit date: 04/25/1951    Years since quitting: 67.6  . Smokeless tobacco: Never Used  Substance and Sexual Activity  . Alcohol use: No    Alcohol/week: 1.0 standard drinks    Types: 1 Glasses of wine per week  . Drug use: No  . Sexual activity: Never  Lifestyle  . Physical activity    Days per week: Not on file    Minutes per session: Not on file  . Stress: Not on file  Relationships  . Social Herbalist on phone: Not on file    Gets together: Not on file    Attends religious service: Not on file    Active member of club or organization: Not on file    Attends meetings of clubs or organizations: Not on file    Relationship status: Not on file  . Intimate partner violence    Fear of current or ex partner: Not on file    Emotionally abused: Not on file    Physically abused: Not  on file    Forced sexual activity: Not on file  Other Topics Concern  . Not on file  Social History Narrative  . Not on file   Family History  Problem Relation Age of Onset  . Cancer Mother       VITAL SIGNS BP (!) 81/59   Pulse 90   Temp 98.6 F (37 C)   Ht 4\' 5"  (1.346 m)   Wt 72 lb (32.7 kg)   BMI 18.02 kg/m   Outpatient Encounter Medications as of 12/14/2018  Medication Sig  . acetaminophen (TYLENOL) 500 MG tablet Take 500 mg by mouth every 6 (six) hours as needed for mild pain, moderate pain, fever or headache.   . albuterol (PROAIR HFA) 108 (90 BASE) MCG/ACT inhaler Inhale 2 puffs into the lungs every 6 (six) hours as needed for wheezing or shortness of breath.   Marland Kitchen alum & mag hydroxide-simeth (MAALOX REGULAR STRENGTH) 200-200-20 MG/5ML suspension Take 15 mLs by mouth every 6 (six) hours as needed for indigestion or heartburn.  Roseanne Kaufman  [place]-Castor Oil (VENELEX) OINT Apply topically to sacrum and bilateral buttocks every shift and as needed for blanchable erythema  . docusate sodium (COLACE) 100 MG capsule Take 100 mg by mouth daily.   . magnesium hydroxide (MILK OF MAGNESIA) 400 MG/5ML suspension Take 30 mLs by mouth as needed for mild constipation.   Marland Kitchen morphine (ROXANOL) 20 MG/ML concentrated solution Take 5 mg by mouth 2 (two) times daily.  Marland Kitchen morphine (ROXANOL) 20 MG/ML concentrated solution Take 5 mg by mouth every 2 (two) hours as needed for severe pain.  . NON FORMULARY Diet Type:  Regular  . Nutritional Supplements (ENSURE ENLIVE PO) Take 1 Bottle by mouth 3 (three) times daily between meals.  . pantoprazole (PROTONIX) 40 MG tablet Take 40 mg by mouth every morning.   Vladimir Faster Glycol-Propyl Glycol (SYSTANE) 0.4-0.3 % SOLN Place 1 drop into both eyes 2 (two) times daily between meals.   . polyethylene glycol (MIRALAX / GLYCOLAX) packet Take 17 g by mouth daily.   . Wound Dressings (ALLEVYN ADHESIVE EX) Apply allevyn foam dressing topically to bilateral heels and change every 3 days and as needed for prevention   No facility-administered encounter medications on file as of 12/14/2018.      SIGNIFICANT DIAGNOSTIC EXAMS  PREVIOUS:   07-10-18: chest x-ray: No acute abnormality noted.  07-10-18: ct of head: Chronic atrophic and ischemic changes without acute abnormality.  07-11-18: 2-d echo:  1. The left ventricle has normal systolic function with an ejection fraction of 60-65%. The cavity size was normal. Left ventricular diastolic Doppler parameters are indeterminate.  2. The right ventricle has normal systolic function. The cavity was normal. There is no increase in right ventricular wall thickness.  3. Left atrial size was moderately dilated.  4. The mitral valve is normal in structure. Mild thickening of the mitral valve leaflet. There is mild mitral annular calcification present.  5. The tricuspid valve is normal in  structure.  6. The aortic valve is tricuspid Mild thickening of the aortic valve.  7. The pulmonic valve was normal in structure.  8. Normal LV function; moderate LAE; mild MR and TR.  07-12-18: ct of head: 1. Atrophy and white matter microvascular disease not changed from prior. 2. No acute findings.  NO NEW EXAMS.   LABS REVIEWED PREVIOUS:   07-10-18: wbc 6.3; hgb 12.3; hct 38.6; mcv 105.2; plt 219; glucose 108; bun 17 creat 0.67; k+ 3.8; na++  142; ca 9.0 liver normal albumin 4.4 urine culture: no growth urine drug screen neg 07-11-18: chol 178; ldl 98  trig 65 hdl 67; tsh 0.831; hgb a1c 5.5 bit B 12 204  07-13-18: glucose 65; bun 21; creat 0.76; k+ 3.2; an++ 139; ca 8.8 mag 1.8; phos 3.1 07-14-18: glucose 120; bun 11; creat 0.63; k+ 3.5; na++ 139  07-19-18: wbc 10.5; hgb 10.8; hct 32.9; mcv 99.4 plt 186; glucose bun 19; creat 0.53;  85; k+ 2.6; na++ 139 ca 8.4 07-26-18: wbc 14.2; hgb 12.3; hct 39.3; mcv 105.1; plt 405; glucose 96; bun 54; creat 0.84; k+ 4.0; na++ 148; ca 9.2   NO NEW LABS.   Review of Systems  Unable to perform ROS: Dementia (unable to participate )    Physical Exam Constitutional:      General: She is not in acute distress.    Appearance: She is cachectic. She is not diaphoretic.  Neck:     Musculoskeletal: Neck supple.     Thyroid: No thyromegaly.  Cardiovascular:     Rate and Rhythm: Normal rate and regular rhythm.     Pulses: Normal pulses.     Heart sounds: Normal heart sounds.  Pulmonary:     Effort: Pulmonary effort is normal. No respiratory distress.     Breath sounds: Normal breath sounds.  Abdominal:     General: Bowel sounds are normal. There is no distension.     Palpations: Abdomen is soft.     Tenderness: There is no abdominal tenderness.  Musculoskeletal: Normal range of motion.     Right lower leg: No edema.     Left lower leg: No edema.  Lymphadenopathy:     Cervical: No cervical adenopathy.  Skin:    General: Skin is warm and dry.      Comments: Bilateral lower extremities discolored   Neurological:     Mental Status: She is alert. Mental status is at baseline.  Psychiatric:        Mood and Affect: Mood normal.      ASSESSMENT/ PLAN:  TODAY:   1. Generalized anxiety disorder: is stable is currently off xanax will monitor   2. Dementia without behavioral disturbance: no significant change: weight is 72 (previous 68,69,71) pounds; will not make changes will monitor her status: unfortunately weight loss is expected at the late stages of this disease process.   3. Tachy/brady syndrome: is stable is not appropriate for a pace maker; will monitor   PREVIOUS   4. Vit B 12 deficiency: is stable will continue to monitor   5. Chronic constipation: is stable will continue colace daily and miralax daily   6. Protein calorie malnutrition, severe/failure to thrive in adult: is without change; weight is 72 (previous 68,69,71) pounds; will continue supplements as directed and will monitor   7. Chronic pain syndrome: is stable will continue roxanol 5 mg twice daily and every 2 hours as needed  8. PUD: is stable will continue protonix 40 mg daily   9. Chronic diastolic heart failure: is presently stable is compensated will monitor     MD is aware of resident's narcotic use and is in agreement with current plan of care. We will attempt to wean resident as apropriate   Ok Edwards NP [facility]  Contact [phone] Monday through Friday 8am- 5pm  After hours call [phone]

## 2019-01-03 ENCOUNTER — Encounter: Payer: Self-pay | Admitting: Adult Health

## 2019-01-03 NOTE — Progress Notes (Signed)
Location:    [place] Room Number: 119/P Place of Service:  SNF (31)   CODE STATUS: DNR  Allergies  Allergen Reactions  . Sulfa Antibiotics Other (See Comments)    Reaction:  Unknown   . Aspirin Other (See Comments)    Reaction:  Burning of pts stomach     Chief Complaint  Patient presents with  . Medical Management of Chronic Issues        HPI:    Past Medical History:  Diagnosis Date  . Anemia   . Arthritis   . Cancer ([place])    uterine surg only  . CHF (congestive heart failure) ([place])   . Colitis 12/31/2012   presumed C.Diff. patient declined colonoscopy  . Hypotension   . Kidney stone   . Muscle weakness (generalized)   . Osteoarthritis   . Pancreatic atrophy 12/31/2012  . Pneumonia     Past Surgical History:  Procedure Laterality Date  . ABDOMINAL HYSTERECTOMY    . CHOLECYSTECTOMY    . ESOPHAGOGASTRODUODENOSCOPY N/A 03/03/2013   RMR: Significant gastric ulcer without bleeding stigmata requiring no endoscopic intervention today. Noncritical Schatzki's ring. Small hiatal hernia  . ESOPHAGOGASTRODUODENOSCOPY N/A 08/24/2013   Dr. [name]: Hiatal hernia.  Gastric mucosal scar formation at site of previously noted gastric ulcerations  . JOINT REPLACEMENT    . LEFT KNEE SURGERY AND REVISION    . RIGHT HIP SURGERY      Social History   Socioeconomic History  . Marital status: Widowed    Spouse name: Not on file  . Number of children: Not on file  . Years of education: Not on file  . Highest education level: Not on file  Occupational History  . Not on file  Social Needs  . Financial resource strain: Not on file  . Food insecurity    Worry: Not on file    Inability: Not on file  . Transportation needs    Medical: Not on file    Non-medical: Not on file  Tobacco Use  . Smoking status: Former Smoker    Types: Cigarettes    Quit date: 04/25/1951    Years since quitting: 67.7  . Smokeless tobacco: Never Used  Substance and  Sexual Activity  . Alcohol use: No    Alcohol/week: 1.0 standard drinks    Types: 1 Glasses of wine per week  . Drug use: No  . Sexual activity: Never  Lifestyle  . Physical activity    Days per week: Not on file    Minutes per session: Not on file  . Stress: Not on file  Relationships  . Social Herbalist on phone: Not on file    Gets together: Not on file    Attends religious service: Not on file    Active member of club or organization: Not on file    Attends meetings of clubs or organizations: Not on file    Relationship status: Not on file  . Intimate partner violence    Fear of current or ex partner: Not on file    Emotionally abused: Not on file    Physically abused: Not on file    Forced sexual activity: Not on file  Other Topics Concern  . Not on file  Social History Narrative  . Not on file   Family History  Problem Relation Age of Onset  . Cancer Mother       VITAL SIGNS BP 102/60   Pulse  72   Temp 98.8 F (37.1 C) (Oral)   Resp 18   Ht 4\' 5"  (1.346 m)   Wt 72 lb (32.7 kg)   SpO2 93%   BMI 18.02 kg/m   Outpatient Encounter Medications as of 01/03/2019  Medication Sig  . acetaminophen (TYLENOL) 500 MG tablet Take 500 mg by mouth every 6 (six) hours as needed for mild pain, moderate pain, fever or headache.   . albuterol (PROAIR HFA) 108 (90 BASE) MCG/ACT inhaler Inhale 2 puffs into the lungs every 6 (six) hours as needed for wheezing or shortness of breath.   Marland Kitchen alum & mag hydroxide-simeth (MAALOX REGULAR STRENGTH) 200-200-20 MG/5ML suspension Take 15 mLs by mouth every 6 (six) hours as needed for indigestion or heartburn.  Roseanne Kaufman [place]-Castor Oil (VENELEX) OINT Apply topically to sacrum and bilateral buttocks every shift and as needed for blanchable erythema  . docusate sodium (COLACE) 100 MG capsule Take 100 mg by mouth daily.   . magnesium hydroxide (MILK OF MAGNESIA) 400 MG/5ML suspension Take 30 mLs by mouth as needed for mild  constipation.   Marland Kitchen morphine (ROXANOL) 20 MG/ML concentrated solution Take 5 mg by mouth 2 (two) times daily.  Marland Kitchen morphine (ROXANOL) 20 MG/ML concentrated solution Take 5 mg by mouth every 2 (two) hours as needed for severe pain.  . NON FORMULARY Diet Type:  Regular  . Nutritional Supplements (ENSURE ENLIVE PO) Take 1 Bottle by mouth 3 (three) times daily between meals.  . pantoprazole (PROTONIX) 40 MG tablet Take 40 mg by mouth every morning.   Vladimir Faster Glycol-Propyl Glycol (SYSTANE) 0.4-0.3 % SOLN Place 1 drop into both eyes 2 (two) times daily between meals.   . polyethylene glycol (MIRALAX / GLYCOLAX) packet Take 17 g by mouth daily.   . Wound Dressings (ALLEVYN ADHESIVE EX) Apply allevyn foam dressing topically to bilateral heels and change every 3 days and as needed for prevention   No facility-administered encounter medications on file as of 01/03/2019.      SIGNIFICANT DIAGNOSTIC EXAMS       ASSESSMENT/ PLAN:    MD is aware of resident's narcotic use and is in agreement with current plan of care. We will attempt to wean resident as apropriate   Ok Edwards NP [facility]  Contact [phone] Monday through Friday 8am- 5pm  After hours call [phone]   This encounter was created in error - please disregard.

## 2019-01-07 ENCOUNTER — Other Ambulatory Visit: Payer: Self-pay

## 2019-01-07 ENCOUNTER — Inpatient Hospital Stay (HOSPITAL_COMMUNITY)
Admission: EM | Admit: 2019-01-07 | Discharge: 2019-01-12 | DRG: 480 | Disposition: A | Discharge status: Skilled Nursing Facility | Payer: Medicare HMO | Source: Skilled Nursing Facility | Attending: Internal Medicine | Admitting: Internal Medicine

## 2019-01-07 ENCOUNTER — Encounter (HOSPITAL_COMMUNITY): Payer: Self-pay

## 2019-01-07 DIAGNOSIS — R627 Adult failure to thrive: Secondary | ICD-10-CM | POA: Diagnosis present

## 2019-01-07 DIAGNOSIS — I5032 Chronic diastolic (congestive) heart failure: Secondary | ICD-10-CM | POA: Diagnosis present

## 2019-01-07 DIAGNOSIS — Z8542 Personal history of malignant neoplasm of other parts of uterus: Secondary | ICD-10-CM

## 2019-01-07 DIAGNOSIS — G8929 Other chronic pain: Secondary | ICD-10-CM | POA: Diagnosis present

## 2019-01-07 DIAGNOSIS — S72009A Fracture of unspecified part of neck of unspecified femur, initial encounter for closed fracture: Secondary | ICD-10-CM | POA: Diagnosis present

## 2019-01-07 DIAGNOSIS — Z7902 Long term (current) use of antithrombotics/antiplatelets: Secondary | ICD-10-CM

## 2019-01-07 DIAGNOSIS — K8689 Other specified diseases of pancreas: Secondary | ICD-10-CM | POA: Diagnosis present

## 2019-01-07 DIAGNOSIS — D638 Anemia in other chronic diseases classified elsewhere: Secondary | ICD-10-CM | POA: Diagnosis present

## 2019-01-07 DIAGNOSIS — Z8673 Personal history of transient ischemic attack (TIA), and cerebral infarction without residual deficits: Secondary | ICD-10-CM

## 2019-01-07 DIAGNOSIS — Z96641 Presence of right artificial hip joint: Secondary | ICD-10-CM | POA: Diagnosis present

## 2019-01-07 DIAGNOSIS — X58XXXA Exposure to other specified factors, initial encounter: Secondary | ICD-10-CM | POA: Diagnosis present

## 2019-01-07 DIAGNOSIS — I495 Sick sinus syndrome: Secondary | ICD-10-CM | POA: Diagnosis present

## 2019-01-07 DIAGNOSIS — S72332A Displaced oblique fracture of shaft of left femur, initial encounter for closed fracture: Secondary | ICD-10-CM

## 2019-01-07 DIAGNOSIS — Y92122 Bedroom in nursing home as the place of occurrence of the external cause: Secondary | ICD-10-CM

## 2019-01-07 DIAGNOSIS — I471 Supraventricular tachycardia: Secondary | ICD-10-CM | POA: Diagnosis not present

## 2019-01-07 DIAGNOSIS — D62 Acute posthemorrhagic anemia: Secondary | ICD-10-CM | POA: Diagnosis not present

## 2019-01-07 DIAGNOSIS — Z681 Body mass index (BMI) 19 or less, adult: Secondary | ICD-10-CM

## 2019-01-07 DIAGNOSIS — Z90710 Acquired absence of both cervix and uterus: Secondary | ICD-10-CM

## 2019-01-07 DIAGNOSIS — R9431 Abnormal electrocardiogram [ECG] [EKG]: Secondary | ICD-10-CM | POA: Diagnosis not present

## 2019-01-07 DIAGNOSIS — Z03818 Encounter for observation for suspected exposure to other biological agents ruled out: Secondary | ICD-10-CM | POA: Diagnosis not present

## 2019-01-07 DIAGNOSIS — Z515 Encounter for palliative care: Secondary | ICD-10-CM | POA: Diagnosis present

## 2019-01-07 DIAGNOSIS — Z66 Do not resuscitate: Secondary | ICD-10-CM | POA: Diagnosis present

## 2019-01-07 DIAGNOSIS — E43 Unspecified severe protein-calorie malnutrition: Secondary | ICD-10-CM | POA: Diagnosis present

## 2019-01-07 DIAGNOSIS — S299XXA Unspecified injury of thorax, initial encounter: Secondary | ICD-10-CM | POA: Diagnosis not present

## 2019-01-07 DIAGNOSIS — F0390 Unspecified dementia without behavioral disturbance: Secondary | ICD-10-CM | POA: Diagnosis present

## 2019-01-07 DIAGNOSIS — Z20828 Contact with and (suspected) exposure to other viral communicable diseases: Secondary | ICD-10-CM | POA: Diagnosis present

## 2019-01-07 DIAGNOSIS — S72332S Displaced oblique fracture of shaft of left femur, sequela: Secondary | ICD-10-CM | POA: Diagnosis present

## 2019-01-07 DIAGNOSIS — E538 Deficiency of other specified B group vitamins: Secondary | ICD-10-CM | POA: Diagnosis present

## 2019-01-07 DIAGNOSIS — Z87891 Personal history of nicotine dependence: Secondary | ICD-10-CM

## 2019-01-07 DIAGNOSIS — Z419 Encounter for procedure for purposes other than remedying health state, unspecified: Secondary | ICD-10-CM

## 2019-01-07 NOTE — ED Triage Notes (Signed)
Pt brought over from [facility] with possible broken hip. Pt was being rolled when there was a pop. There is deformity and shortening to left leg. Pt is under comfort care at nursing home.  She last rec. liquid morphine at 2230

## 2019-01-08 ENCOUNTER — Emergency Department (HOSPITAL_COMMUNITY): Payer: Medicare HMO

## 2019-01-08 ENCOUNTER — Other Ambulatory Visit: Payer: Self-pay

## 2019-01-08 DIAGNOSIS — E538 Deficiency of other specified B group vitamins: Secondary | ICD-10-CM | POA: Diagnosis present

## 2019-01-08 DIAGNOSIS — D62 Acute posthemorrhagic anemia: Secondary | ICD-10-CM | POA: Diagnosis not present

## 2019-01-08 DIAGNOSIS — I959 Hypotension, unspecified: Secondary | ICD-10-CM | POA: Diagnosis not present

## 2019-01-08 DIAGNOSIS — Z66 Do not resuscitate: Secondary | ICD-10-CM | POA: Diagnosis not present

## 2019-01-08 DIAGNOSIS — F0390 Unspecified dementia without behavioral disturbance: Secondary | ICD-10-CM | POA: Diagnosis present

## 2019-01-08 DIAGNOSIS — Z96641 Presence of right artificial hip joint: Secondary | ICD-10-CM | POA: Diagnosis present

## 2019-01-08 DIAGNOSIS — M2550 Pain in unspecified joint: Secondary | ICD-10-CM | POA: Diagnosis not present

## 2019-01-08 DIAGNOSIS — D649 Anemia, unspecified: Secondary | ICD-10-CM | POA: Diagnosis not present

## 2019-01-08 DIAGNOSIS — S72009A Fracture of unspecified part of neck of unspecified femur, initial encounter for closed fracture: Secondary | ICD-10-CM | POA: Diagnosis present

## 2019-01-08 DIAGNOSIS — R627 Adult failure to thrive: Secondary | ICD-10-CM

## 2019-01-08 DIAGNOSIS — Z515 Encounter for palliative care: Secondary | ICD-10-CM | POA: Diagnosis not present

## 2019-01-08 DIAGNOSIS — Z01810 Encounter for preprocedural cardiovascular examination: Secondary | ICD-10-CM | POA: Diagnosis not present

## 2019-01-08 DIAGNOSIS — Z8542 Personal history of malignant neoplasm of other parts of uterus: Secondary | ICD-10-CM | POA: Diagnosis not present

## 2019-01-08 DIAGNOSIS — I495 Sick sinus syndrome: Secondary | ICD-10-CM | POA: Diagnosis not present

## 2019-01-08 DIAGNOSIS — Z20828 Contact with and (suspected) exposure to other viral communicable diseases: Secondary | ICD-10-CM | POA: Diagnosis not present

## 2019-01-08 DIAGNOSIS — S72332S Displaced oblique fracture of shaft of left femur, sequela: Secondary | ICD-10-CM | POA: Diagnosis present

## 2019-01-08 DIAGNOSIS — R69 Illness, unspecified: Secondary | ICD-10-CM | POA: Diagnosis not present

## 2019-01-08 DIAGNOSIS — I471 Supraventricular tachycardia: Secondary | ICD-10-CM | POA: Diagnosis not present

## 2019-01-08 DIAGNOSIS — S7292XD Unspecified fracture of left femur, subsequent encounter for closed fracture with routine healing: Secondary | ICD-10-CM | POA: Diagnosis not present

## 2019-01-08 DIAGNOSIS — G8929 Other chronic pain: Secondary | ICD-10-CM | POA: Diagnosis present

## 2019-01-08 DIAGNOSIS — S72332A Displaced oblique fracture of shaft of left femur, initial encounter for closed fracture: Principal | ICD-10-CM

## 2019-01-08 DIAGNOSIS — K8689 Other specified diseases of pancreas: Secondary | ICD-10-CM | POA: Diagnosis not present

## 2019-01-08 DIAGNOSIS — Z8673 Personal history of transient ischemic attack (TIA), and cerebral infarction without residual deficits: Secondary | ICD-10-CM | POA: Diagnosis not present

## 2019-01-08 DIAGNOSIS — E43 Unspecified severe protein-calorie malnutrition: Secondary | ICD-10-CM | POA: Diagnosis not present

## 2019-01-08 DIAGNOSIS — Z90710 Acquired absence of both cervix and uterus: Secondary | ICD-10-CM | POA: Diagnosis not present

## 2019-01-08 DIAGNOSIS — D638 Anemia in other chronic diseases classified elsewhere: Secondary | ICD-10-CM | POA: Diagnosis present

## 2019-01-08 DIAGNOSIS — Z7401 Bed confinement status: Secondary | ICD-10-CM | POA: Diagnosis not present

## 2019-01-08 DIAGNOSIS — I5032 Chronic diastolic (congestive) heart failure: Secondary | ICD-10-CM | POA: Diagnosis not present

## 2019-01-08 DIAGNOSIS — Y92122 Bedroom in nursing home as the place of occurrence of the external cause: Secondary | ICD-10-CM | POA: Diagnosis not present

## 2019-01-08 DIAGNOSIS — Z7902 Long term (current) use of antithrombotics/antiplatelets: Secondary | ICD-10-CM | POA: Diagnosis not present

## 2019-01-08 DIAGNOSIS — S299XXA Unspecified injury of thorax, initial encounter: Secondary | ICD-10-CM | POA: Diagnosis not present

## 2019-01-08 DIAGNOSIS — S7292XA Unspecified fracture of left femur, initial encounter for closed fracture: Secondary | ICD-10-CM | POA: Diagnosis not present

## 2019-01-08 DIAGNOSIS — X58XXXA Exposure to other specified factors, initial encounter: Secondary | ICD-10-CM | POA: Diagnosis present

## 2019-01-08 DIAGNOSIS — Z681 Body mass index (BMI) 19 or less, adult: Secondary | ICD-10-CM | POA: Diagnosis not present

## 2019-01-08 DIAGNOSIS — Z87891 Personal history of nicotine dependence: Secondary | ICD-10-CM | POA: Diagnosis not present

## 2019-01-08 LAB — URINALYSIS, ROUTINE W REFLEX MICROSCOPIC
Bilirubin Urine: NEGATIVE
Glucose, UA: NEGATIVE mg/dL
Ketones, ur: NEGATIVE mg/dL
Nitrite: NEGATIVE
Protein, ur: NEGATIVE mg/dL
Specific Gravity, Urine: 1.012 (ref 1.005–1.030)
WBC, UA: >50 WBC/hpf — ABNORMAL HIGH (ref 0–5)
pH: 8 (ref 5.0–8.0)

## 2019-01-08 LAB — CBC WITH DIFFERENTIAL/PLATELET
Abs Immature Granulocytes: 0.05 10*3/uL (ref 0.00–0.07)
Basophils Absolute: 0.1 10*3/uL (ref 0.0–0.1)
Basophils Relative: 0 %
Eosinophils Absolute: 0.1 10*3/uL (ref 0.0–0.5)
Eosinophils Relative: 0 %
HCT: 35 % — ABNORMAL LOW (ref 36.0–46.0)
Hemoglobin: 10.8 g/dL — ABNORMAL LOW (ref 12.0–15.0)
Immature Granulocytes: 0 %
Lymphocytes Relative: 12 %
Lymphs Abs: 1.5 10*3/uL (ref 0.7–4.0)
MCH: 31.1 pg (ref 26.0–34.0)
MCHC: 30.9 g/dL (ref 30.0–36.0)
MCV: 100.9 fL — ABNORMAL HIGH (ref 80.0–100.0)
Monocytes Absolute: 1.2 10*3/uL — ABNORMAL HIGH (ref 0.1–1.0)
Monocytes Relative: 9 %
Neutro Abs: 10.5 10*3/uL — ABNORMAL HIGH (ref 1.7–7.7)
Neutrophils Relative %: 79 %
Platelets: 313 10*3/uL (ref 150–400)
RBC: 3.47 MIL/uL — ABNORMAL LOW (ref 3.87–5.11)
RDW: 15.9 % — ABNORMAL HIGH (ref 11.5–15.5)
WBC: 13.3 10*3/uL — ABNORMAL HIGH (ref 4.0–10.5)
nRBC: 0 % (ref 0.0–0.2)

## 2019-01-08 LAB — COMPREHENSIVE METABOLIC PANEL WITH GFR
ALT: 13 U/L (ref 0–44)
AST: 18 U/L (ref 15–41)
Albumin: 3.1 g/dL — ABNORMAL LOW (ref 3.5–5.0)
Alkaline Phosphatase: 63 U/L (ref 38–126)
Anion gap: 5 (ref 5–15)
BUN: 22 mg/dL (ref 8–23)
CO2: 26 mmol/L (ref 22–32)
Calcium: 8.3 mg/dL — ABNORMAL LOW (ref 8.9–10.3)
Chloride: 109 mmol/L (ref 98–111)
Creatinine, Ser: 0.59 mg/dL (ref 0.44–1.00)
GFR calc Af Amer: >60 mL/min
GFR calc non Af Amer: >60 mL/min
Glucose, Bld: 106 mg/dL — ABNORMAL HIGH (ref 70–99)
Potassium: 4.1 mmol/L (ref 3.5–5.1)
Sodium: 140 mmol/L (ref 135–145)
Total Bilirubin: 0.5 mg/dL (ref 0.3–1.2)
Total Protein: 6.1 g/dL — ABNORMAL LOW (ref 6.5–8.1)

## 2019-01-08 LAB — MRSA PCR SCREENING: MRSA by PCR: NEGATIVE

## 2019-01-08 LAB — PROTIME-INR
INR: 1.1 (ref 0.8–1.2)
Prothrombin Time: 13.8 seconds (ref 11.4–15.2)

## 2019-01-08 LAB — SARS CORONAVIRUS 2 BY RT PCR (HOSPITAL ORDER, PERFORMED IN ~~LOC~~ HOSPITAL LAB): SARS Coronavirus 2: NEGATIVE

## 2019-01-08 MED ORDER — ACETAMINOPHEN 325 MG PO TABS: 650.0000 mg | ORAL_TABLET | Freq: Four times a day (QID) | ORAL | Status: DC | PRN

## 2019-01-08 MED ORDER — ONDANSETRON HCL 4 MG PO TABS: 4.0000 mg | ORAL_TABLET | Freq: Four times a day (QID) | ORAL | Status: DC | PRN

## 2019-01-08 MED ORDER — PANTOPRAZOLE SODIUM 40 MG PO TBEC
40.0000 mg | DELAYED_RELEASE_TABLET | Freq: Every morning | ORAL | Status: DC
  Administered 2019-01-08 – 2019-01-12 (×4): 40 mg via ORAL
  Filled 2019-01-08 (×4): qty 1

## 2019-01-08 MED ORDER — ALUM & MAG HYDROXIDE-SIMETH 200-200-20 MG/5ML PO SUSP: 15.0000 mL | Freq: Four times a day (QID) | ORAL | Status: DC | PRN

## 2019-01-08 MED ORDER — MORPHINE SULFATE (CONCENTRATE) 10 MG/0.5ML PO SOLN
5.0000 mg | Freq: Two times a day (BID) | ORAL | Status: DC
  Administered 2019-01-08 – 2019-01-12 (×7): 5 mg via ORAL
  Filled 2019-01-08 (×8): qty 0.5

## 2019-01-08 MED ORDER — ACETAMINOPHEN 500 MG PO TABS: 500.0000 mg | ORAL_TABLET | Freq: Four times a day (QID) | ORAL | Status: DC | PRN

## 2019-01-08 MED ORDER — ENSURE ENLIVE PO LIQD
1.0000 | Freq: Three times a day (TID) | ORAL | Status: DC
  Administered 2019-01-09 – 2019-01-12 (×6): 237 mL via ORAL
  Filled 2019-01-08 (×8): qty 237

## 2019-01-08 MED ORDER — MORPHINE SULFATE (CONCENTRATE) 10 MG/0.5ML PO SOLN
5.0000 mg | ORAL | Status: DC | PRN
  Administered 2019-01-08 – 2019-01-11 (×4): 5 mg via ORAL
  Filled 2019-01-08 (×4): qty 0.5

## 2019-01-08 MED ORDER — MAGNESIUM HYDROXIDE 400 MG/5ML PO SUSP
30.0000 mL | ORAL | Status: DC | PRN
  Administered 2019-01-12: 10:00:00 30 mL via ORAL
  Filled 2019-01-08: qty 30

## 2019-01-08 MED ORDER — DOCUSATE SODIUM 100 MG PO CAPS
100.0000 mg | ORAL_CAPSULE | Freq: Every day | ORAL | Status: DC
  Administered 2019-01-08 – 2019-01-09 (×2): 100 mg via ORAL
  Filled 2019-01-08 (×2): qty 1

## 2019-01-08 MED ORDER — MORPHINE SULFATE (PF) 2 MG/ML IV SOLN: 1.0000 mg | INTRAVENOUS | Status: DC | PRN

## 2019-01-08 MED ORDER — MORPHINE SULFATE (PF) 2 MG/ML IV SOLN
2.0000 mg | INTRAVENOUS | Status: DC | PRN
  Administered 2019-01-08: 2 mg via INTRAVENOUS

## 2019-01-08 MED ORDER — POLYETHYLENE GLYCOL 3350 17 G PO PACK
17.0000 g | PACK | Freq: Every day | ORAL | Status: DC
  Administered 2019-01-09 – 2019-01-12 (×3): 17 g via ORAL
  Filled 2019-01-08 (×3): qty 1

## 2019-01-08 MED ORDER — MORPHINE SULFATE (PF) 4 MG/ML IV SOLN
4.0000 mg | Freq: Once | INTRAVENOUS | Status: AC
  Administered 2019-01-08: 4 mg via INTRAMUSCULAR
  Filled 2019-01-08: qty 1

## 2019-01-08 MED ORDER — ONDANSETRON HCL 4 MG/2ML IJ SOLN: 4.0000 mg | Freq: Four times a day (QID) | INTRAMUSCULAR | Status: DC | PRN

## 2019-01-08 MED ORDER — ACETAMINOPHEN 650 MG RE SUPP: 650.0000 mg | Freq: Four times a day (QID) | RECTAL | Status: DC | PRN

## 2019-01-08 MED ORDER — HEPARIN SODIUM (PORCINE) 5000 UNIT/ML IJ SOLN
5000.0000 [IU] | Freq: Three times a day (TID) | INTRAMUSCULAR | Status: DC
  Administered 2019-01-08 – 2019-01-09 (×4): 5000 [IU] via SUBCUTANEOUS
  Filled 2019-01-08 (×4): qty 1

## 2019-01-08 MED ORDER — MORPHINE SULFATE (PF) 2 MG/ML IV SOLN
INTRAVENOUS | Status: AC
  Filled 2019-01-08: qty 1

## 2019-01-08 MED ORDER — HEPARIN SODIUM (PORCINE) 5000 UNIT/ML IJ SOLN
5000.0000 [IU] | Freq: Three times a day (TID) | INTRAMUSCULAR | Status: DC
  Administered 2019-01-08: 5000 [IU] via SUBCUTANEOUS
  Filled 2019-01-08: qty 1

## 2019-01-08 MED ORDER — POLYVINYL ALCOHOL 1.4 % OP SOLN
1.0000 [drp] | Freq: Two times a day (BID) | OPHTHALMIC | Status: DC
  Administered 2019-01-08 – 2019-01-12 (×6): 1 [drp] via OPHTHALMIC
  Filled 2019-01-08 (×3): qty 15

## 2019-01-08 MED ORDER — POTASSIUM CHLORIDE IN NACL 20-0.9 MEQ/L-% IV SOLN
INTRAVENOUS | Status: DC
  Administered 2019-01-08 – 2019-01-09 (×2): via INTRAVENOUS
  Filled 2019-01-08 (×2): qty 1000

## 2019-01-08 NOTE — Progress Notes (Signed)
Patient ID: [name], female   DOB: March 07, 1919, 85 y.o.   MRN: [id number] I was called about this patient since I have seen her before almost a year ago for a distal humeral fracture.  This is the only thing that I have treated for her.  Apparently she has a left femur shaft fracture.  Due to complex medical and cardiac issues, it was requested that she be transferred down to [facility] to the medical service.  She will need medical and cardiac clearance for surgery for likely intramedullary nail placement to fix the left femur.  I am not sure as to the timing of surgery.  Most likely I will need to have 1 of my colleagues potentially perform the surgery due to the OR availability.  When the patient does arrive to [facility] and has a hospital bed, the [facility] need to be notified so they can place her left leg and Buck's traction.

## 2019-01-08 NOTE — ED Notes (Signed)
Updated [place] on pt

## 2019-01-08 NOTE — Progress Notes (Signed)
PROGRESS NOTE  [name] [id number] DOB: Oct 05, 1918 DOA: 01/07/2019 PCP: Hennie Duos, MD  Brief History:  85 year old female with a history of diastolic CHF, tachybradycardia syndrome, failure to thrive, cognitive impairment, severe protein calorie malnutrition presenting from SNF [facility]) secondary to left leg pain.  The patient was being rolled over by SNF staff when a pop was heard.  The patient developed severe pain in her left leg, and it was noted that there was shortening and deformity of her left lower extremity.  The patient was brought to emergency department for further evaluation.  She has not been providing significant history secondary to cognitive impairment.  However, she is able to answer some basic questions.  Presently she denies any fevers, chills, chest pain, shortness breath, abdominal pain, headache, coughing.  She complains of left leg pain. In the emergency department, the patient was afebrile hemodynamically stable saturating 96% on room air.  BMP, LFTs, EKG were unremarkable.  WBC was 10.3 with hemoglobin 10.8.  X-ray of the left leg showed an angulated oblique fracture of the proximal third of the left femoral diaphysis.  Orthopedics was consulted, but felt that the patient needed cardiac clearance given her history of tachybradycardia syndrome.  Assessment/Plan: Left femur fracture -Orthopedic consult, Dr. Aline Brochure -I spoke at length with both daughters--I explained to them that pt has hx of Tachy-Brady syndrome and other co-morbidities and may need to be transferred to [facility] for surgery and cardiac clearance (No cardiology available at [facility] 8/29 or 8/30) -daughters are not in favor of transfer and would consider transition to full comfort care and residential hospice pending discussion with Ortho -pain control  Tachy-Brady Syndrome -pt seen by EP, Dr. Curt Bears in March 2020 -PPM was deferred at that time  Chronic Diastolic  CHF -euvolemic clinically  History of TIA -continue plavix if no surgery planned  B12 Deficiency -replete  Adult Failure to Thrive -overall poor prognosis given co-morbidities, poor po intake and new femur fracture         Disposition Plan:  Residential Hospice if no surgical intervention Family Communication:   Both daughters updated on phone 8/30  Consultants:  Ortho  Code Status: DNR  DVT Prophylaxis:  [place] Heparin   Procedures: As Listed in Progress Note Above  Antibiotics: None      Subjective: Patient denies fevers, chills, headache, chest pain, dyspnea, nausea, vomiting, diarrhea, abdominal pain, dysuria    Objective: Vitals:   01/07/19 2345 01/07/19 2349  BP:  104/84  Pulse:  63  Resp:  14  Temp:  98.7 F (37.1 C)  SpO2:  95%  Weight: 32.7 kg   Height: 4\' 5"  (1.346 m)    No intake or output data in the 24 hours ending 01/08/19 0749 Weight change:  Exam:   General:  Pt is alert, follows commands appropriately, not in acute distress  HEENT: No icterus, No thrush, No neck mass, [place]/AT  Cardiovascular: RRR, S1/S2, no rubs, no gallops  Respiratory: bibasilar crackles, no wheeze  Abdomen: Soft/+BS, non tender, non distended, no guarding  Extremities: No edema, No lymphangitis, No petechiae, No rashes, no synovitis   Data Reviewed: I have personally reviewed following labs and imaging studies Basic Metabolic Panel: Recent Labs  Lab 01/08/19 0335  NA 140  K 4.1  CL 109  CO2 26  GLUCOSE 106*  BUN 22  CREATININE 0.59  CALCIUM 8.3*   Liver Function Tests: Recent Labs  Lab 01/08/19 0335  AST 18  ALT 13  ALKPHOS 63  BILITOT 0.5  PROT 6.1*  ALBUMIN 3.1*   No results for input(s): LIPASE, AMYLASE in the last 168 hours. No results for input(s): AMMONIA in the last 168 hours. Coagulation Profile: Recent Labs  Lab 01/08/19 0335  INR 1.1   CBC: Recent Labs  Lab 01/08/19 0335  WBC 13.3*  NEUTROABS 10.5*  HGB 10.8*   HCT 35.0*  MCV 100.9*  PLT 313   Cardiac Enzymes: No results for input(s): CKTOTAL, CKMB, CKMBINDEX, TROPONINI in the last 168 hours. BNP: Invalid input(s): POCBNP CBG: No results for input(s): GLUCAP in the last 168 hours. HbA1C: No results for input(s): HGBA1C in the last 72 hours. Urine analysis:    Component Value Date/Time   COLORURINE YELLOW 01/08/2019 0722   APPEARANCEUR HAZY (A) 01/08/2019 0722   LABSPEC 1.012 01/08/2019 0722   PHURINE 8.0 01/08/2019 0722   GLUCOSEU NEGATIVE 01/08/2019 0722   HGBUR SMALL (A) 01/08/2019 0722   BILIRUBINUR NEGATIVE 01/08/2019 0722   KETONESUR NEGATIVE 01/08/2019 0722   PROTEINUR NEGATIVE 01/08/2019 0722   UROBILINOGEN 0.2 03/13/2015 0120   NITRITE NEGATIVE 01/08/2019 0722   LEUKOCYTESUR LARGE (A) 01/08/2019 0722   Sepsis Labs: @LABRCNTIP (procalcitonin:4,lacticidven:4) )No results found for this or any previous visit (from the past 240 hour(s)).   Scheduled Meds: Continuous Infusions:  Procedures/Studies: Dg Chest 1 View  Result Date: 01/08/2019 CLINICAL DATA:  Leg injury EXAM: CHEST  1 VIEW COMPARISON:  07/10/2018 FINDINGS: Severe scoliosis and thoracic cage deformity. No acute airspace disease or effusion. Stable cardiomediastinal silhouette. No pneumothorax. Old bilateral rib fractures. IMPRESSION: No active disease.  Severe scoliosis. Electronically Signed   By: Donavan Foil M.D.   On: 01/08/2019 01:21   Dg Hip Unilat W Or Wo Pelvis 2-3 Views Left  Result Date: 01/08/2019 CLINICAL DATA:  85 year old female with left lower extremity pain. EXAM: DG HIP (WITH OR WITHOUT PELVIS) 2-3V LEFT; LEFT FEMUR 2 VIEWS COMPARISON:  Left hip radiograph dated 05/09/2016 FINDINGS: There is a displaced and angulated oblique fracture of the proximal third of the left femoral diaphysis with varus angulation of the distal fracture fragment. No definite other acute fracture identified. Old healed bilateral pubic bone fractures. There is no dislocation.  There is a total right hip arthroplasty which appears intact and in anatomic alignment. There is a total left knee arthroplasty which also appears intact. The soft tissues are unremarkable. Vascular calcifications noted. IMPRESSION: Displaced and angulated oblique fracture of the proximal third of the left femoral diaphysis. Electronically Signed   By: Anner Crete M.D.   On: 01/08/2019 01:22   Dg Femur Min 2 Views Left  Result Date: 01/08/2019 CLINICAL DATA:  85 year old female with left lower extremity pain. EXAM: DG HIP (WITH OR WITHOUT PELVIS) 2-3V LEFT; LEFT FEMUR 2 VIEWS COMPARISON:  Left hip radiograph dated 05/09/2016 FINDINGS: There is a displaced and angulated oblique fracture of the proximal third of the left femoral diaphysis with varus angulation of the distal fracture fragment. No definite other acute fracture identified. Old healed bilateral pubic bone fractures. There is no dislocation. There is a total right hip arthroplasty which appears intact and in anatomic alignment. There is a total left knee arthroplasty which also appears intact. The soft tissues are unremarkable. Vascular calcifications noted. IMPRESSION: Displaced and angulated oblique fracture of the proximal third of the left femoral diaphysis. Electronically Signed   By: Anner Crete M.D.   On: 01/08/2019 01:22  Orson Eva, DO  [facility] Pager [phone]  If 7PM-7AM, please contact night-coverage [email or web address] Password [email or web address] 01/08/2019, 7:49 AM   LOS: 0 days

## 2019-01-08 NOTE — Progress Notes (Signed)
Report called to Matthew Folks, RN at [facility].

## 2019-01-08 NOTE — Progress Notes (Signed)
Had numerous conversations with both daughters [place]. Explained ortho ([facility]) recommendations and risks of not having surgery.  I also explained that pt has hx of Tachy-Brady syndrome and other co-morbidities and needs to be transferred to tertiary center for surgery.    Daughters agree for patient to be transferred.  I discussed the case with pt's established orthopedic surgeon--Dr. Jean Rosenthal who agreed to see patient after transfer to [facility].  Patient will also need cardiology consult for cardiac clearance prior to surgery--please consult them.  DTat

## 2019-01-08 NOTE — H&P (Signed)
History and Physical  [name] [id number] DOB: 09/11/1918 DOA: 01/07/2019   PCP: Hennie Duos, MD   Patient coming from: Home  Chief Complaint: Left Femur Fracture  HPI:  [name] is a 85 y.o. female with medical history of diastolic CHF, tachybradycardia syndrome, failure to thrive, cognitive impairment, severe protein calorie malnutrition presenting from SNF [facility]) secondary to left leg pain.  The patient was being rolled over by SNF staff when a pop was heard.  The patient developed severe pain in her left leg, and it was noted that there was shortening and deformity of her left lower extremity.  The patient was brought to emergency department for further evaluation.  She has not been providing significant history secondary to cognitive impairment.  However, she is able to answer some basic questions.  Presently she denies any fevers, chills, chest pain, shortness breath, abdominal pain, headache, coughing.  She complains of left leg pain. In the emergency department, the patient was afebrile hemodynamically stable saturating 96% on room air.  BMP, LFTs, EKG were unremarkable.  WBC was 10.3 with hemoglobin 10.8.  X-ray of the left leg showed an angulated oblique fracture of the proximal third of the left femoral diaphysis.  Orthopedics was consulted, but felt that the patient needed cardiac clearance given her history of tachybradycardia syndrome.  Assessment/Plan: Left femur fracture -Orthopedic consult, Dr. Aline Brochure -I spoke at length with both daughters--I explained to them that pt has hx of Tachy-Brady syndrome and other co-morbidities and may need to be transferred to [facility] for surgery and cardiac clearance (No cardiology available at [facility] 8/29 or 8/30) -daughters are not in favor of transfer and would consider transition to full comfort care and residential hospice pending discussion with Ortho -pain control  Tachy-Brady Syndrome -pt seen by  EP, Dr. Curt Bears in March 2020 -PPM was deferred at that time  Chronic Diastolic CHF -euvolemic clinically  History of TIA -continue plavix if no surgery planned  B12 Deficiency -replete  Adult Failure to Thrive -overall poor prognosis given co-morbidities, poor po intake and new femur fracture        Past Medical History:  Diagnosis Date  . Anemia   . Arthritis   . Cancer ([place])    uterine surg only  . CHF (congestive heart failure) ([place])   . Colitis 12/31/2012   presumed C.Diff. patient declined colonoscopy  . Hypotension   . Kidney stone   . Muscle weakness (generalized)   . Osteoarthritis   . Pancreatic atrophy 12/31/2012  . Pneumonia    Past Surgical History:  Procedure Laterality Date  . ABDOMINAL HYSTERECTOMY    . CHOLECYSTECTOMY    . ESOPHAGOGASTRODUODENOSCOPY N/A 03/03/2013   RMR: Significant gastric ulcer without bleeding stigmata requiring no endoscopic intervention today. Noncritical Schatzki's ring. Small hiatal hernia  . ESOPHAGOGASTRODUODENOSCOPY N/A 08/24/2013   Dr. [name]: Hiatal hernia.  Gastric mucosal scar formation at site of previously noted gastric ulcerations  . JOINT REPLACEMENT    . LEFT KNEE SURGERY AND REVISION    . RIGHT HIP SURGERY     Social History:  reports that she quit smoking about 67 years ago. Her smoking use included cigarettes. She has never used smokeless tobacco. She reports that she does not drink alcohol or use drugs.   Family History  Problem Relation Age of Onset  . Cancer Mother      Allergies  Allergen Reactions  . Sulfa Antibiotics Other (See Comments)  Reaction:  Unknown   . Aspirin Other (See Comments)    Reaction:  Burning of pts stomach      Prior to Admission medications   Medication Sig Start Date End Date Taking? Authorizing Provider  acetaminophen (TYLENOL) 500 MG tablet Take 500 mg by mouth every 6 (six) hours as needed for mild pain, moderate pain, fever or headache.  07/15/18   [provider]  albuterol (PROAIR HFA) 108 (90 BASE) MCG/ACT inhaler Inhale 2 puffs into the lungs every 6 (six) hours as needed for wheezing or shortness of breath.  07/15/18   [provider]  alum & mag hydroxide-simeth (MAALOX REGULAR STRENGTH) 200-200-20 MG/5ML suspension Take 15 mLs by mouth every 6 (six) hours as needed for indigestion or heartburn. 08/16/16   Kinnie Feil, PA-C  Balsam [place]-Castor Oil [place]) OINT Apply topically to sacrum and bilateral buttocks every shift and as needed for blanchable erythema 07/16/18   [provider]  docusate sodium (COLACE) 100 MG capsule Take 100 mg by mouth daily.  07/15/18   [provider]  magnesium hydroxide (MILK OF MAGNESIA) 400 MG/5ML suspension Take 30 mLs by mouth as needed for mild constipation.  09/15/18   [provider]  morphine (ROXANOL) 20 MG/ML concentrated solution Take 5 mg by mouth 2 (two) times daily.    [provider]  morphine (ROXANOL) 20 MG/ML concentrated solution Take 5 mg by mouth every 2 (two) hours as needed for severe pain.    [provider]  NON FORMULARY Diet Type:  Regular 07/26/18   [provider]  Nutritional Supplements (ENSURE ENLIVE PO) Take 1 Bottle by mouth 3 (three) times daily between meals. 07/21/18   [provider]  pantoprazole (PROTONIX) 40 MG tablet Take 40 mg by mouth every morning.     [provider]  Polyethyl Glycol-Propyl Glycol (SYSTANE) 0.4-0.3 % SOLN Place 1 drop into both eyes 2 (two) times daily between meals.  10/13/18   [provider]  polyethylene glycol (MIRALAX / GLYCOLAX) packet Take 17 g by mouth daily.     [provider]  Wound Dressings (ALLEVYN ADHESIVE EX) Apply allevyn foam dressing topically to bilateral heels and change every 3 days and as needed for prevention 07/26/18   [provider]    Review of Systems:  Constitutional:  No weight loss, night sweats, Fevers, chills   Head&Eyes: No headache.  No vision loss.  No eye pain or scotoma ENT:  No Difficulty swallowing,Tooth/dental problems,Sore throat,   Cardio-vascular:  No chest pain, Orthopnea, PND, swelling in lower extremities,   GI:  No  abdominal pain, nausea, vomiting, diarrhea, loss of appetite, hematochezia, melena, heartburn, indigestion, Resp:  No shortness of breath with exertion or at rest. No cough. No wheezing.No chest wall deformity  Skin:  no rash or lesions.  GU:  no dysuria, change in color of urine, no urgency or frequency. No flank pain.  Musculoskeletal:  Left leg pain+ Psych:  No change in mood or affect. No depression or anxiety. Neurologic: No headache, no dysesthesia, no focal weakness, no vision loss. No syncope  Physical Exam: Vitals:   01/07/19 2345 01/07/19 2349 01/08/19 0808  BP:  104/84 (!) 105/51  Pulse:  63 60  Resp:  14 11  Temp:  98.7 F (37.1 C)   SpO2:  95% 96%  Weight: 32.7 kg    Height: 4\' 5"  (1.346 m)     General:  A&O x 2, NAD, nontoxic,  pleasant/cooperative Head/Eye: No conjunctival hemorrhage, no icterus, [place]/AT, No nystagmus ENT:  No icterus,  No thrush, good dentition, no pharyngeal exudate Neck:  No masses, no lymphadenpathy, no bruits CV:  RRR, no rub, no gallop, no S3 Lung:  Bibasilar crackles, no wheeze Abdomen: soft/NT, +BS, nondistended, no peritoneal signs Ext: No cyanosis, No rashes, No petechiae, No lymphangitis, No edema Neuro: CNII-XII intact, strength 4/5 in bilateral upper and lower extremities, no dysmetria  Labs on Admission:  Basic Metabolic Panel: Recent Labs  Lab 01/08/19 0335  NA 140  K 4.1  CL 109  CO2 26  GLUCOSE 106*  BUN 22  CREATININE 0.59  CALCIUM 8.3*   Liver Function Tests: Recent Labs  Lab 01/08/19 0335  AST 18  ALT 13  ALKPHOS 63  BILITOT 0.5  PROT 6.1*  ALBUMIN 3.1*   No results for input(s): LIPASE, AMYLASE in the last 168 hours. No results for input(s): AMMONIA in the last 168  hours. CBC: Recent Labs  Lab 01/08/19 0335  WBC 13.3*  NEUTROABS 10.5*  HGB 10.8*  HCT 35.0*  MCV 100.9*  PLT 313   Coagulation Profile: Recent Labs  Lab 01/08/19 0335  INR 1.1   Cardiac Enzymes: No results for input(s): CKTOTAL, CKMB, CKMBINDEX, TROPONINI in the last 168 hours. BNP: Invalid input(s): POCBNP CBG: No results for input(s): GLUCAP in the last 168 hours. Urine analysis:    Component Value Date/Time   COLORURINE YELLOW 01/08/2019 0722   APPEARANCEUR HAZY (A) 01/08/2019 0722   LABSPEC 1.012 01/08/2019 0722   PHURINE 8.0 01/08/2019 0722   GLUCOSEU NEGATIVE 01/08/2019 0722   HGBUR SMALL (A) 01/08/2019 0722   BILIRUBINUR NEGATIVE 01/08/2019 0722   KETONESUR NEGATIVE 01/08/2019 0722   PROTEINUR NEGATIVE 01/08/2019 0722   UROBILINOGEN 0.2 03/13/2015 0120   NITRITE NEGATIVE 01/08/2019 0722   LEUKOCYTESUR LARGE (A) 01/08/2019 0722   Sepsis Labs: @LABRCNTIP (procalcitonin:4,lacticidven:4) )No results found for this or any previous visit (from the past 240 hour(s)).   Radiological Exams on Admission: Dg Chest 1 View  Result Date: 01/08/2019 CLINICAL DATA:  Leg injury EXAM: CHEST  1 VIEW COMPARISON:  07/10/2018 FINDINGS: Severe scoliosis and thoracic cage deformity. No acute airspace disease or effusion. Stable cardiomediastinal silhouette. No pneumothorax. Old bilateral rib fractures. IMPRESSION: No active disease.  Severe scoliosis. Electronically Signed   By: Donavan Foil M.D.   On: 01/08/2019 01:21   Dg Hip Unilat W Or Wo Pelvis 2-3 Views Left  Result Date: 01/08/2019 CLINICAL DATA:  85 year old female with left lower extremity pain. EXAM: DG HIP (WITH OR WITHOUT PELVIS) 2-3V LEFT; LEFT FEMUR 2 VIEWS COMPARISON:  Left hip radiograph dated 05/09/2016 FINDINGS: There is a displaced and angulated oblique fracture of the proximal third of the left femoral diaphysis with varus angulation of the distal fracture fragment. No definite other acute fracture identified.  Old healed bilateral pubic bone fractures. There is no dislocation. There is a total right hip arthroplasty which appears intact and in anatomic alignment. There is a total left knee arthroplasty which also appears intact. The soft tissues are unremarkable. Vascular calcifications noted. IMPRESSION: Displaced and angulated oblique fracture of the proximal third of the left femoral diaphysis. Electronically Signed   By: Anner Crete M.D.   On: 01/08/2019 01:22   Dg Femur Min 2 Views Left  Result Date: 01/08/2019 CLINICAL DATA:  85 year old female with left lower extremity pain. EXAM: DG HIP (WITH OR WITHOUT PELVIS) 2-3V LEFT; LEFT FEMUR 2 VIEWS COMPARISON:  Left hip radiograph  dated 05/09/2016 FINDINGS: There is a displaced and angulated oblique fracture of the proximal third of the left femoral diaphysis with varus angulation of the distal fracture fragment. No definite other acute fracture identified. Old healed bilateral pubic bone fractures. There is no dislocation. There is a total right hip arthroplasty which appears intact and in anatomic alignment. There is a total left knee arthroplasty which also appears intact. The soft tissues are unremarkable. Vascular calcifications noted. IMPRESSION: Displaced and angulated oblique fracture of the proximal third of the left femoral diaphysis. Electronically Signed   By: Anner Crete M.D.   On: 01/08/2019 01:22    EKG: Independently reviewed. Sinus, no STT changes    Time spent:70 minutes Code Status:   DNR Family Communication:  Both daughters updated on phone Disposition Plan: transfer to [facility] vs Residential Hospice Consults called: Ortho  DVT Prophylaxis: Heparin     Orson Eva, DO  [facility] Pager [phone]  If 7PM-7AM, please contact night-coverage [email or web address] Password [facility] 01/08/2019, 8:13 AM

## 2019-01-08 NOTE — ED Notes (Signed)
Barnett Applebaum spoke with Dr. Carles Collet who advised to consult orthopedics for further instructions regarding splinting. Dr. Aline Brochure paged.

## 2019-01-08 NOTE — ED Notes (Addendum)
Entered room with Cherlyn Roberts to apply [facility] traction splint and splint much too large for this patient. She will contact Dr. Carles Collet for further instructions.

## 2019-01-08 NOTE — Assessment & Plan Note (Signed)
Left femoral shaft fracture: 85 yrs old non ambulatory. Dementia, failure to thrive, chf, tachbrady-arrhytmia, recent workup for possible stroke was negative in March of 2020.. the fracture is tenting the skin and needs fixation despite medical history age and ambulatory status. Very limted chest capacity due to scoliosis. EF is 50-60%

## 2019-01-08 NOTE — Plan of Care (Signed)

## 2019-01-08 NOTE — Progress Notes (Signed)
Ortho tech here to place pt in [place] traction per Dr Trevor Mace note 01/08/2019 at 1504PM.

## 2019-01-08 NOTE — Progress Notes (Signed)
Report given to [facility] ETA of 20 min given

## 2019-01-08 NOTE — ED Provider Notes (Signed)
[facility] EMERGENCY DEPARTMENT Provider Note   CSN: [id number] Arrival date & time: 01/07/19  2335   Time seen 12:30 AM  History   Chief Complaint Chief Complaint  Patient presents with  . Hip Pain    left    HPI Taleyah D Espericueta is a 85 y.o. female.   Level 5 caveat for age  HPI patient presents from her nursing facility where they report they rolled her over and they heard a large pop.  She has a deformity and complains of pain in her left leg.  Patient is currently under comfort care at her nursing home.  She is also getting liquid morphine, last dose at 10:30 PM.  PCP Hennie Duos, MD   Patient is DO NOT RESUSCITATE Patient has a most form that has antibiotics checked to give, comfort care, and IV fluids as a trial.   Past Medical History:  Diagnosis Date  . Anemia   . Arthritis   . Cancer ([place])    uterine surg only  . CHF (congestive heart failure) ([place])   . Colitis 12/31/2012   presumed C.Diff. patient declined colonoscopy  . Hypotension   . Kidney stone   . Muscle weakness (generalized)   . Osteoarthritis   . Pancreatic atrophy 12/31/2012  . Pneumonia     Patient Active Problem List   Diagnosis Date Noted  . Femoral neck fracture ([place]) 01/08/2019  . Dementia without behavioral disturbance ([place]) 07/31/2018  . Generalized pain 07/31/2018  . Failure to thrive in adult 07/19/2018  . Generalized anxiety disorder 07/18/2018  . Tachy-brady syndrome ([place]) 07/18/2018  . Confusion 07/10/2018  . Chronic pain 07/10/2018  . Closed fracture of distal end of right humerus with routine healing 01/04/2018  . Multiple rib fractures 05/09/2016  . Falls 05/09/2016  . Leukocytosis 05/09/2016  . Chest pain 03/13/2015  . Other specified fever   . Pain in the chest   . Abdominal pain 10/28/2014  . Anemia 10/28/2014  . Hypokalemia 10/28/2014  . Protein-calorie malnutrition, severe ([place]) 10/28/2014  . Macrocytic anemia 08/13/2014  . Chronic diastolic CHF  (congestive heart failure) ([place]) 08/13/2014  . PUD (peptic ulcer disease) 10/05/2013  . Acute gastric ulcer due to Helicobacter pylori [id number]  . Chronic constipation 02/23/2013  . Right kidney stone 12/31/2012  . Hypotension 12/31/2012  . Pancreatic atrophy 12/31/2012    Past Surgical History:  Procedure Laterality Date  . ABDOMINAL HYSTERECTOMY    . CHOLECYSTECTOMY    . ESOPHAGOGASTRODUODENOSCOPY N/A 03/03/2013   RMR: Significant gastric ulcer without bleeding stigmata requiring no endoscopic intervention today. Noncritical Schatzki's ring. Small hiatal hernia  . ESOPHAGOGASTRODUODENOSCOPY N/A 08/24/2013   Dr. [name]: Hiatal hernia.  Gastric mucosal scar formation at site of previously noted gastric ulcerations  . JOINT REPLACEMENT    . LEFT KNEE SURGERY AND REVISION    . RIGHT HIP SURGERY       OB History    Gravida  3   Para  2   Term  2   Preterm      AB  1   Living        SAB  1   TAB      Ectopic      Multiple      Live Births               Home Medications    Prior to Admission medications   Medication Sig Start Date End Date Taking? Authorizing Provider  acetaminophen (TYLENOL)  500 MG tablet Take 500 mg by mouth every 6 (six) hours as needed for mild pain, moderate pain, fever or headache.  07/15/18   [provider]  albuterol (PROAIR HFA) 108 (90 BASE) MCG/ACT inhaler Inhale 2 puffs into the lungs every 6 (six) hours as needed for wheezing or shortness of breath.  07/15/18   [provider]  alum & mag hydroxide-simeth (MAALOX REGULAR STRENGTH) 200-200-20 MG/5ML suspension Take 15 mLs by mouth every 6 (six) hours as needed for indigestion or heartburn. 08/16/16   Kinnie Feil, PA-C  Balsam [place]-Castor Oil [place]) OINT Apply topically to sacrum and bilateral buttocks every shift and as needed for blanchable erythema 07/16/18   [provider]  docusate sodium (COLACE) 100 MG capsule Take 100 mg by mouth daily.  07/15/18    [provider]  magnesium hydroxide (MILK OF MAGNESIA) 400 MG/5ML suspension Take 30 mLs by mouth as needed for mild constipation.  09/15/18   [provider]  morphine (ROXANOL) 20 MG/ML concentrated solution Take 5 mg by mouth 2 (two) times daily.    [provider]  morphine (ROXANOL) 20 MG/ML concentrated solution Take 5 mg by mouth every 2 (two) hours as needed for severe pain.    [provider]  NON FORMULARY Diet Type:  Regular 07/26/18   [provider]  Nutritional Supplements (ENSURE ENLIVE PO) Take 1 Bottle by mouth 3 (three) times daily between meals. 07/21/18   [provider]  pantoprazole (PROTONIX) 40 MG tablet Take 40 mg by mouth every morning.     [provider]  Polyethyl Glycol-Propyl Glycol (SYSTANE) 0.4-0.3 % SOLN Place 1 drop into both eyes 2 (two) times daily between meals.  10/13/18   [provider]  polyethylene glycol (MIRALAX / GLYCOLAX) packet Take 17 g by mouth daily.     [provider]  Wound Dressings (ALLEVYN ADHESIVE EX) Apply allevyn foam dressing topically to bilateral heels and change every 3 days and as needed for prevention 07/26/18   [provider]    Family History Family History  Problem Relation Age of Onset  . Cancer Mother     Social History Social History   Tobacco Use  . Smoking status: Former Smoker    Types: Cigarettes    Quit date: 04/25/1951    Years since quitting: 67.7  . Smokeless tobacco: Never Used  Substance Use Topics  . Alcohol use: No    Alcohol/week: 1.0 standard drinks    Types: 1 Glasses of wine per week  . Drug use: No  lives in NH  Allergies   Sulfa antibiotics and Aspirin   Review of Systems Review of Systems  All other systems reviewed and are negative.    Physical Exam Updated Vital Signs BP 104/84 (BP Location: Right Arm)   Pulse 63   Temp 98.7 F (37.1 C)   Resp 14   Ht 4\' 5"  (1.346 m)   Wt 32.7 kg   SpO2  95%   BMI 18.02 kg/m   Physical Exam Vitals signs and nursing note reviewed.  Constitutional:      Comments: Frail elderly female, she appears to be hard of hearing, she does complain of her leg hurting  HENT:     Head: Normocephalic and atraumatic.     Right Ear: External ear normal.     Left Ear: External ear normal.     Nose: Nose normal.  Eyes:     Extraocular Movements: Extraocular  movements intact.     Conjunctiva/sclera: Conjunctivae normal.     Pupils: Pupils are equal, round, and reactive to light.  Neck:     Musculoskeletal: Normal range of motion.  Cardiovascular:     Rate and Rhythm: Normal rate.  Pulmonary:     Effort: Pulmonary effort is normal. No respiratory distress.  Musculoskeletal:     Comments: Patient has swelling in her proximal left thigh with marked shortening of her left lower extremity, there is no external rotation present.  Skin:    General: Skin is warm and dry.     Findings: No erythema.  Neurological:     Mental Status: She is alert. Mental status is at baseline.  Psychiatric:        Speech: Speech is delayed.        Behavior: Behavior is slowed.      ED Treatments / Results  Labs (all labs ordered are listed, but only abnormal results are displayed) Results for orders placed or performed during the hospital encounter of 01/07/19  Comprehensive metabolic panel  Result Value Ref Range   Sodium 140 135 - 145 mmol/L   Potassium 4.1 3.5 - 5.1 mmol/L   Chloride 109 98 - 111 mmol/L   CO2 26 22 - 32 mmol/L   Glucose, Bld 106 (H) 70 - 99 mg/dL   BUN 22 8 - 23 mg/dL   Creatinine, Ser 0.59 0.44 - 1.00 mg/dL   Calcium 8.3 (L) 8.9 - 10.3 mg/dL   Total Protein 6.1 (L) 6.5 - 8.1 g/dL   Albumin 3.1 (L) 3.5 - 5.0 g/dL   AST 18 15 - 41 U/L   ALT 13 0 - 44 U/L   Alkaline Phosphatase 63 38 - 126 U/L   Total Bilirubin 0.5 0.3 - 1.2 mg/dL   GFR calc non Af Amer >60 >60 mL/min   GFR calc Af Amer >60 >60 mL/min   Anion gap 5 5 - 15  CBC with  Differential  Result Value Ref Range   WBC 13.3 (H) 4.0 - 10.5 K/uL   RBC 3.47 (L) 3.87 - 5.11 MIL/uL   Hemoglobin 10.8 (L) 12.0 - 15.0 g/dL   HCT 35.0 (L) 36.0 - 46.0 %   MCV 100.9 (H) 80.0 - 100.0 fL   MCH 31.1 26.0 - 34.0 pg   MCHC 30.9 30.0 - 36.0 g/dL   RDW 15.9 (H) 11.5 - 15.5 %   Platelets 313 150 - 400 K/uL   nRBC 0.0 0.0 - 0.2 %   Neutrophils Relative % 79 %   Neutro Abs 10.5 (H) 1.7 - 7.7 K/uL   Lymphocytes Relative 12 %   Lymphs Abs 1.5 0.7 - 4.0 K/uL   Monocytes Relative 9 %   Monocytes Absolute 1.2 (H) 0.1 - 1.0 K/uL   Eosinophils Relative 0 %   Eosinophils Absolute 0.1 0.0 - 0.5 K/uL   Basophils Relative 0 %   Basophils Absolute 0.1 0.0 - 0.1 K/uL   Immature Granulocytes 0 %   Abs Immature Granulocytes 0.05 0.00 - 0.07 K/uL  Protime-INR  Result Value Ref Range   Prothrombin Time 13.8 11.4 - 15.2 seconds   INR 1.1 0.8 - 1.2   Laboratory interpretation all normal except leukocytosis, anemia, malnutrition    EKG EKG Interpretation  Date/Time:  Saturday January 08 2019 05:56:08 EDT Ventricular Rate:  77 PR Interval:    QRS Duration: 81 QT Interval:  404 QTC Calculation: 458 R Axis:   -53 Text Interpretation:  Sinus rhythm Left atrial enlargement Left axis deviation Probable anteroseptal infarct, recent Baseline wander in lead(s) V1 V2 No significant change since last tracing 10 Jul 2018 Confirmed by [name] [phone]) on 01/08/2019 7:27:55 AM     Radiology Dg Chest 1 View  Result Date: 01/08/2019 CLINICAL DATA:  Leg injury EXAM: CHEST  1 VIEW COMPARISON:  07/10/2018 FINDINGS: Severe scoliosis and thoracic cage deformity. No acute airspace disease or effusion. Stable cardiomediastinal silhouette. No pneumothorax. Old bilateral rib fractures. IMPRESSION: No active disease.  Severe scoliosis. Electronically Signed   By: Donavan Foil M.D.   On: 01/08/2019 01:21   Dg Hip Unilat W Or Wo Pelvis 2-3 Views Left  Result Date: 01/08/2019 CLINICAL DATA:   85 year old female with left lower extremity pain. EXAM: DG HIP (WITH OR WITHOUT PELVIS) 2-3V LEFT; LEFT FEMUR 2 VIEWS COMPARISON:  Left hip radiograph dated 05/09/2016 FINDINGS: There is a displaced and angulated oblique fracture of the proximal third of the left femoral diaphysis with varus angulation of the distal fracture fragment. No definite other acute fracture identified. Old healed bilateral pubic bone fractures. There is no dislocation. There is a total right hip arthroplasty which appears intact and in anatomic alignment. There is a total left knee arthroplasty which also appears intact. The soft tissues are unremarkable. Vascular calcifications noted. IMPRESSION: Displaced and angulated oblique fracture of the proximal third of the left femoral diaphysis. Electronically Signed   By: Anner Crete M.D.   On: 01/08/2019 01:22   Dg Femur Min 2 Views Left  Result Date: 01/08/2019 CLINICAL DATA:  85 year old female with left lower extremity pain. EXAM: DG HIP (WITH OR WITHOUT PELVIS) 2-3V LEFT; LEFT FEMUR 2 VIEWS COMPARISON:  Left hip radiograph dated 05/09/2016 FINDINGS: There is a displaced and angulated oblique fracture of the proximal third of the left femoral diaphysis with varus angulation of the distal fracture fragment. No definite other acute fracture identified. Old healed bilateral pubic bone fractures. There is no dislocation. There is a total right hip arthroplasty which appears intact and in anatomic alignment. There is a total left knee arthroplasty which also appears intact. The soft tissues are unremarkable. Vascular calcifications noted. IMPRESSION: Displaced and angulated oblique fracture of the proximal third of the left femoral diaphysis. Electronically Signed   By: Anner Crete M.D.   On: 01/08/2019 01:22    Procedures Procedures (including critical care time)  Medications Ordered in ED Medications  morphine 2 MG/ML injection 2 mg (2 mg Intravenous Given 01/08/19 0717)   morphine 4 MG/ML injection 4 mg (4 mg Intramuscular Given 01/08/19 0315)     Initial Impression / Assessment and Plan / ED Course  I have reviewed the triage vital signs and the nursing notes.  Pertinent labs & imaging results that were available during my care of the patient were reviewed by me and considered in my medical decision making (see chart for details).   Patient was placed in a hare traction splint.     2:56 AM D/W Arcelia Jew, daughter, [place], [phone] will talk to her sister to decide if they want surgery or not.   03:16 AM Peter Congo, has discussed with patient's other daughter, they would like to proceed with surgery for patient comfort.   Admission testing was ordered.  Foley catheter was inserted for comfort.  5:03 AM Dr. Aline Brochure, orthopedist on call, states to have the hospitalist admit.  5:58 AM Dr Clearence Ped, hospitalist will admit, she states the day doctors will see her.  Final Clinical Impressions(s) / ED Diagnoses   Final diagnoses:  Closed displaced oblique fracture of shaft of left femur, initial encounter [facility])    Plan admission  [name], MD, Barbette Or, MD 01/08/19 [phone]

## 2019-01-08 NOTE — ED Notes (Signed)
Per Dr. Aline Brochure, do not apply Hare traction splint.

## 2019-01-08 NOTE — Progress Notes (Addendum)
Patient ID: [name], female   DOB: [birth year], 85 y.o.   MRN: [id number]  89 female reportedly has not walked for the last 4 months with h/o  Tachybradycardia: Noted on telemetry in march 2020.  Likely asymptomatic.  EP cardiology input appreciated and I discussed with Dr. Curt Bears who indicates that patient has tacky/bradycardia syndrome, has up to 10-second pauses with episodes of tachycardia, suspect asymptomatic and recommends avoiding pacemaker and AV nodal blocking agents.  EP cardiology follow-up appreciated: Bradycardia events/heart block felt to be vagally mediated.  No symptoms of bradycardia elicitable.  Patient denied history of syncope or fainting.  Continued recommendations of avoiding pacemaker in light of no clear symptomatology associated with either tachycardia or bradycardia, with PMH of chronic diastolic CHF, uterine cancer status post hysterectomy, arthritis, chronic pain, in March presented to ED due to confusion, slurred speech that started evening prior to admission which reportedly started 1 hour to hour and a half after her evening dose of oxycodone.  She was admitted for acute encephalopathy and suspected TIA. CT scan were normal and she refused MRI  She needs extensive preop work up including cardiology and pulmonology. Severe scoliosis is limiting lung capacity  Then Anesthesia will have to opine on whether or not she can have the femur fracture fixed at [facility] and my preliminary feeling is that she will need to be transferred.  While I would generally say that she should not have surgery the angulation of the fracture is so severe that it will eventually cause skin puncture    My choice of fixation would be submuscular plating

## 2019-01-08 NOTE — Consult Note (Addendum)
Reason for Consult: Fracture left femur Referring Physician: Dr. Carles Collet  Assessment/Plan: 85 year old female with CHF 58 to 55% ejection fraction tachybradycardia arrhythmia failure to thrive dementia nonambulatory presents with a proximal femoral shaft fracture.  While it would be considered to not operate on this patient I think that her pain and general care especially for toiletry is going to be extremely difficult without fixation.  However, I have spoken to anesthesia here and they have indicated that the patient should be transferred to a tertiary care facility or higher level of care   Perrie D Butch is an 85 y.o. female.  HPI: 85 year old female presents with a left femur fracture which occurred while the patient was being turned while in bed.  Apparently she has become a nonambulator over the last 4 months.  She was recently admitted to a long-term care facility after being worked up for stroke failure to thrive and since that time in March of this year has not really ambulated.  She has chronic diastolic heart failure she had a hysterectomy for cancer she has chronic pain arthritis she has had knee replacement on the left and right hip surgery  She has a bradycardia tacky syndrome which was not worked up due to the patient's age medical condition and it was felt that nothing needed to be done at that time.  Note from her records from her admission for the stroke indicate the following  Tachybradycardia: Noted on telemetry.  Likely asymptomatic.  EP cardiology input appreciated and I discussed with Dr. Curt Bears who indicates that patient has tacky/bradycardia syndrome, has up to 10-second pauses with episodes of tachycardia, suspect asymptomatic and recommends avoiding pacemaker and AV nodal blocking agents.  EP cardiology follow-up appreciated: Bradycardia events/heart block felt to be vagally mediated.  No symptoms of bradycardia elicitable.  Patient denied history of syncope or  fainting.  Continued recommendations of avoiding pacemaker in light of no clear symptomatology associated with either tachycardia or bradycardia.     Past Medical History:  Diagnosis Date  . Anemia   . Arthritis   . Cancer ([place])    uterine surg only  . CHF (congestive heart failure) ([place])   . Colitis 12/31/2012   presumed C.Diff. patient declined colonoscopy  . Hypotension   . Kidney stone   . Muscle weakness (generalized)   . Osteoarthritis   . Pancreatic atrophy 12/31/2012  . Pneumonia     Past Surgical History:  Procedure Laterality Date  . ABDOMINAL HYSTERECTOMY    . CHOLECYSTECTOMY    . ESOPHAGOGASTRODUODENOSCOPY N/A 03/03/2013   RMR: Significant gastric ulcer without bleeding stigmata requiring no endoscopic intervention today. Noncritical Schatzki's ring. Small hiatal hernia  . ESOPHAGOGASTRODUODENOSCOPY N/A 08/24/2013   Dr. [name]: Hiatal hernia.  Gastric mucosal scar formation at site of previously noted gastric ulcerations  . JOINT REPLACEMENT    . LEFT KNEE SURGERY AND REVISION    . RIGHT HIP SURGERY      Family History  Problem Relation Age of Onset  . Cancer Mother     Social History:  reports that she quit smoking about 67 years ago. Her smoking use included cigarettes. She has never used smokeless tobacco. She reports that she does not drink alcohol or use drugs.  Allergies:  Allergies  Allergen Reactions  . Sulfa Antibiotics Other (See Comments)    Reaction:  Unknown   . Aspirin Other (See Comments)    Reaction:  Burning of pts stomach       Results for  orders placed or performed during the hospital encounter of 01/07/19 (from the past 48 hour(s))  SARS Coronavirus 2 [facility] order, Performed in [facility] hospital lab) Nasopharyngeal Nasopharyngeal Swab     Status: None   Collection Time: 01/08/19  3:19 AM   Specimen: Nasopharyngeal Swab  Result Value Ref Range   SARS Coronavirus 2 NEGATIVE NEGATIVE    Comment: (NOTE) If result is  NEGATIVE SARS-CoV-2 target nucleic acids are NOT DETECTED. The SARS-CoV-2 RNA is generally detectable in upper and lower  respiratory specimens during the acute phase of infection. The lowest  concentration of SARS-CoV-2 viral copies this assay can detect is 250  copies / mL. A negative result does not preclude SARS-CoV-2 infection  and should not be used as the sole basis for treatment or other  patient management decisions.  A negative result may occur with  improper specimen collection / handling, submission of specimen other  than nasopharyngeal swab, presence of viral mutation(s) within the  areas targeted by this assay, and inadequate number of viral copies  (<250 copies / mL). A negative result must be combined with clinical  observations, patient history, and epidemiological information. If result is POSITIVE SARS-CoV-2 target nucleic acids are DETECTED. The SARS-CoV-2 RNA is generally detectable in upper and lower  respiratory specimens dur ing the acute phase of infection.  Positive  results are indicative of active infection with SARS-CoV-2.  Clinical  correlation with patient history and other diagnostic information is  necessary to determine patient infection status.  Positive results do  not rule out bacterial infection or co-infection with other viruses. If result is PRESUMPTIVE POSTIVE SARS-CoV-2 nucleic acids MAY BE PRESENT.   A presumptive positive result was obtained on the submitted specimen  and confirmed on repeat testing.  While 2019 novel coronavirus  (SARS-CoV-2) nucleic acids may be present in the submitted sample  additional confirmatory testing may be necessary for epidemiological  and / or clinical management purposes  to differentiate between  SARS-CoV-2 and other Sarbecovirus currently known to infect humans.  If clinically indicated additional testing with an alternate test  methodology [phone]) is advised. The SARS-CoV-2 RNA is generally  detectable  in upper and lower respiratory sp ecimens during the acute  phase of infection. The expected result is Negative. Fact Sheet for Patients:  StrictlyIdeas.no Fact Sheet for Healthcare Providers: BankingDealers.co.za This test is not yet approved or cleared by the [place] FDA and has been authorized for detection and/or diagnosis of SARS-CoV-2 by FDA under an Emergency Use Authorization (EUA).  This EUA will remain in effect (meaning this test can be used) for the duration of the COVID-19 declaration under Section 564(b)(1) of the Act, 21 U.S.C. section 360bbb-3(b)(1), unless the authorization is terminated or revoked sooner. Performed at [facility], [address] [place], [place] [address]   Comprehensive metabolic panel     Status: Abnormal   Collection Time: 01/08/19  3:35 AM  Result Value Ref Range   Sodium 140 135 - 145 mmol/L   Potassium 4.1 3.5 - 5.1 mmol/L   Chloride 109 98 - 111 mmol/L   CO2 26 22 - 32 mmol/L   Glucose, Bld 106 (H) 70 - 99 mg/dL   BUN 22 8 - 23 mg/dL   Creatinine, Ser 0.59 0.44 - 1.00 mg/dL   Calcium 8.3 (L) 8.9 - 10.3 mg/dL   Total Protein 6.1 (L) 6.5 - 8.1 g/dL   Albumin 3.1 (L) 3.5 - 5.0 g/dL   AST 18 15 -  41 U/L   ALT 13 0 - 44 U/L   Alkaline Phosphatase 63 38 - 126 U/L   Total Bilirubin 0.5 0.3 - 1.2 mg/dL   GFR calc non Af Amer >60 >60 mL/min   GFR calc Af Amer >60 >60 mL/min   Anion gap 5 5 - 15    Comment: Performed at [facility], [address], [place], [place] [address]  CBC with Differential     Status: Abnormal   Collection Time: 01/08/19  3:35 AM  Result Value Ref Range   WBC 13.3 (H) 4.0 - 10.5 K/uL   RBC 3.47 (L) 3.87 - 5.11 MIL/uL   Hemoglobin 10.8 (L) 12.0 - 15.0 g/dL   HCT 35.0 (L) 36.0 - 46.0 %   MCV 100.9 (H) 80.0 - 100.0 fL   MCH 31.1 26.0 - 34.0 pg   MCHC 30.9 30.0 - 36.0 g/dL   RDW 15.9 (H) 11.5 - 15.5 %   Platelets 313 150 - 400 K/uL   nRBC 0.0 0.0 - 0.2 %    Neutrophils Relative % 79 %   Neutro Abs 10.5 (H) 1.7 - 7.7 K/uL   Lymphocytes Relative 12 %   Lymphs Abs 1.5 0.7 - 4.0 K/uL   Monocytes Relative 9 %   Monocytes Absolute 1.2 (H) 0.1 - 1.0 K/uL   Eosinophils Relative 0 %   Eosinophils Absolute 0.1 0.0 - 0.5 K/uL   Basophils Relative 0 %   Basophils Absolute 0.1 0.0 - 0.1 K/uL   Immature Granulocytes 0 %   Abs Immature Granulocytes 0.05 0.00 - 0.07 K/uL    Comment: Performed at [facility], [address] [place], [place] [address]  Protime-INR     Status: None   Collection Time: 01/08/19  3:35 AM  Result Value Ref Range   Prothrombin Time 13.8 11.4 - 15.2 seconds   INR 1.1 0.8 - 1.2    Comment: (NOTE) INR goal varies based on device and disease states. Performed at [facility], [address] [place], [place] [address]   Urinalysis, Routine w reflex microscopic     Status: Abnormal   Collection Time: 01/08/19  7:22 AM  Result Value Ref Range   Color, Urine YELLOW YELLOW   APPearance HAZY (A) CLEAR   Specific Gravity, Urine 1.012 1.005 - 1.030   pH 8.0 5.0 - 8.0   Glucose, UA NEGATIVE NEGATIVE mg/dL   Hgb urine dipstick SMALL (A) NEGATIVE   Bilirubin Urine NEGATIVE NEGATIVE   Ketones, ur NEGATIVE NEGATIVE mg/dL   Protein, ur NEGATIVE NEGATIVE mg/dL   Nitrite NEGATIVE NEGATIVE   Leukocytes,Ua LARGE (A) NEGATIVE   RBC / HPF 0-5 0 - 5 RBC/hpf   WBC, UA >50 (H) 0 - 5 WBC/hpf   Bacteria, UA RARE (A) NONE SEEN   Squamous Epithelial / LPF 0-5 0 - 5   WBC Clumps PRESENT    Budding Yeast PRESENT     Comment: Performed at [facility], [address], [place], [place] [address]    Dg Chest 1 View  Result Date: 01/08/2019 CLINICAL DATA:  Leg injury EXAM: CHEST  1 VIEW COMPARISON:  07/10/2018 FINDINGS: Severe scoliosis and thoracic cage deformity. No acute airspace disease or effusion. Stable cardiomediastinal silhouette. No pneumothorax. Old bilateral rib fractures. IMPRESSION: No active disease.  Severe scoliosis. Electronically  Signed   By: Donavan Foil M.D.   On: 01/08/2019 01:21   Dg Hip Unilat W Or Wo Pelvis 2-3 Views Left  Result Date: 01/08/2019  CLINICAL DATA:  85 year old female with left lower extremity pain. EXAM: DG HIP (WITH OR WITHOUT PELVIS) 2-3V LEFT; LEFT FEMUR 2 VIEWS COMPARISON:  Left hip radiograph dated 05/09/2016 FINDINGS: There is a displaced and angulated oblique fracture of the proximal third of the left femoral diaphysis with varus angulation of the distal fracture fragment. No definite other acute fracture identified. Old healed bilateral pubic bone fractures. There is no dislocation. There is a total right hip arthroplasty which appears intact and in anatomic alignment. There is a total left knee arthroplasty which also appears intact. The soft tissues are unremarkable. Vascular calcifications noted. IMPRESSION: Displaced and angulated oblique fracture of the proximal third of the left femoral diaphysis. Electronically Signed   By: Anner Crete M.D.   On: 01/08/2019 01:22   Dg Femur Min 2 Views Left  Result Date: 01/08/2019 CLINICAL DATA:  85 year old female with left lower extremity pain. EXAM: DG HIP (WITH OR WITHOUT PELVIS) 2-3V LEFT; LEFT FEMUR 2 VIEWS COMPARISON:  Left hip radiograph dated 05/09/2016 FINDINGS: There is a displaced and angulated oblique fracture of the proximal third of the left femoral diaphysis with varus angulation of the distal fracture fragment. No definite other acute fracture identified. Old healed bilateral pubic bone fractures. There is no dislocation. There is a total right hip arthroplasty which appears intact and in anatomic alignment. There is a total left knee arthroplasty which also appears intact. The soft tissues are unremarkable. Vascular calcifications noted. IMPRESSION: Displaced and angulated oblique fracture of the proximal third of the left femoral diaphysis. Electronically Signed   By: Anner Crete M.D.   On: 01/08/2019 01:22    Review of Systems   Unable to perform ROS: Dementia   Blood pressure (!) 112/47, pulse 67, temperature 98.7 F (37.1 C), resp. rate 11, height 4\' 5"  (1.346 m), weight 32.7 kg, SpO2 94 %. Physical Exam  Appearance she is small cachectic short  She is oriented to person only not time or place chronic dementia  Mood is pleasant no agitation affect is normal  The patient has not walked since March  Skin both legs show bruises which appear to be from easy bruising bleeding  Distal pulses are intact with normal temperature no edema or swelling heels have no heel sores  Lymph nodes in the groin are normal  She appears to have normal sensation  Toes are downgoing.  Deep tendon reflexes were deferred coordination and balance were also deferred  Her upper extremities show no fracture dislocation subluxation atrophy or tremor they are regular well aligned with no tenderness muscle tone is normal all joints were stable  Her right lower extremity had some internal rotation but this appears to be chronic.  No alignment issues otherwise no subluxations or contracture of the hip knee or ankle all look stable muscle tone normal  Left lower extremity also shows internal rotation as the foot on the left and right screw shorter crisscross.  There is no threatening of the skin as indicated on x-ray as the skin has plenty of padding at the proximal part of the fracture we do find that she is extremely tender in this area the thigh shows some deformity.  Muscle tone normal knee and hip seems stable range of motion deferred for obvious reasons   Assessment/Plan: 85 year old female with CHF 50 to 55% ejection fraction tachybradycardia arrhythmia failure to thrive dementia nonambulatory presents with a proximal femoral shaft fracture.  While it would be considered to not operate on this patient  I think that her pain and general care especially for toiletry is going to be extremely difficult without fixation.  However, I  have spoken to anesthesia here and they have indicated that the patient should be transferred to a tertiary care facility or higher level of care at least.  She also has severe diminished lung capacity dur to severe thoracic scoliosis   Considerations for surgery include intramedullary nailing.  There is some concern that she may not fit the fracture table to get done of traction.  The blood loss from reaming would also be somewhat consider although non-REM nail could be used  Open reduction of the fracture with interfrag screw and submuscular plating could be also performed as the patient does not ambulate    Arther Abbott 01/08/2019, 1:17 PM

## 2019-01-08 NOTE — Progress Notes (Signed)
AC followed up with [facility] for ETA for patient pick up, no ETA given at this time.

## 2019-01-08 NOTE — Progress Notes (Signed)
Pt leaving unit via [facility], prn pain med given prior to transport.

## 2019-01-08 NOTE — ED Notes (Signed)
Pt confused and yelling out. Thinks she has been lying there for 3 days without food or water. Denies pain.

## 2019-01-09 ENCOUNTER — Encounter (HOSPITAL_COMMUNITY): Payer: Self-pay | Admitting: *Deleted

## 2019-01-09 DIAGNOSIS — I495 Sick sinus syndrome: Secondary | ICD-10-CM

## 2019-01-09 DIAGNOSIS — Z01810 Encounter for preprocedural cardiovascular examination: Secondary | ICD-10-CM

## 2019-01-09 DIAGNOSIS — I5032 Chronic diastolic (congestive) heart failure: Secondary | ICD-10-CM

## 2019-01-09 MED ORDER — ENSURE PRE-SURGERY PO LIQD
296.0000 mL | Freq: Once | ORAL | Status: AC
  Administered 2019-01-10: 05:00:00 296 mL via ORAL
  Filled 2019-01-09: qty 296

## 2019-01-09 MED ORDER — TRANEXAMIC ACID-NACL 1000-0.7 MG/100ML-% IV SOLN
1000.0000 mg | INTRAVENOUS | Status: AC
  Administered 2019-01-10: 13:00:00 1000 mg via INTRAVENOUS
  Filled 2019-01-09: qty 100

## 2019-01-09 MED ORDER — CEFAZOLIN SODIUM-DEXTROSE 2-4 GM/100ML-% IV SOLN
2.0000 g | INTRAVENOUS | Status: AC
  Administered 2019-01-10: 13:00:00 2 g via INTRAVENOUS
  Filled 2019-01-09: qty 100

## 2019-01-09 MED ORDER — TRANEXAMIC ACID 1000 MG/10ML IV SOLN
2000.0000 mg | INTRAVENOUS | Status: DC
  Filled 2019-01-09: qty 20

## 2019-01-09 MED ORDER — POVIDONE-IODINE 10 % EX SWAB: 2.0000 "application " | Freq: Once | CUTANEOUS | Status: DC

## 2019-01-09 NOTE — Consult Note (Signed)
ORTHOPAEDIC CONSULTATION  REQUESTING PHYSICIAN: Little Ishikawa, MD  Chief Complaint: Left femur fx  HPI: [name] is a 85 y.o. female who presents with left femur fx who has had dementia, nonambulatory due to tachy brady syndrome.  She was transferred down to [facility] for complex medical and cardiac comorbidities that further need work up and optimization prior to surgery.  I spoke to her daughter this morning for the HPI.  Past Medical History:  Diagnosis Date  . Anemia   . Arthritis   . Cancer ([place])    uterine surg only  . CHF (congestive heart failure) ([place])   . Colitis 12/31/2012   presumed C.Diff. patient declined colonoscopy  . Hypotension   . Kidney stone   . Muscle weakness (generalized)   . Osteoarthritis   . Pancreatic atrophy 12/31/2012  . Pneumonia    Past Surgical History:  Procedure Laterality Date  . ABDOMINAL HYSTERECTOMY    . CHOLECYSTECTOMY    . ESOPHAGOGASTRODUODENOSCOPY N/A 03/03/2013   RMR: Significant gastric ulcer without bleeding stigmata requiring no endoscopic intervention today. Noncritical Schatzki's ring. Small hiatal hernia  . ESOPHAGOGASTRODUODENOSCOPY N/A 08/24/2013   Dr. [name]: Hiatal hernia.  Gastric mucosal scar formation at site of previously noted gastric ulcerations  . JOINT REPLACEMENT    . LEFT KNEE SURGERY AND REVISION    . RIGHT HIP SURGERY     Social History   Socioeconomic History  . Marital status: Widowed    Spouse name: Not on file  . Number of children: Not on file  . Years of education: Not on file  . Highest education level: Not on file  Occupational History  . Not on file  Social Needs  . Financial resource strain: Not on file  . Food insecurity    Worry: Not on file    Inability: Not on file  . Transportation needs    Medical: Not on file    Non-medical: Not on file  Tobacco Use  . Smoking status: Former Smoker    Types: Cigarettes    Quit date: 04/25/1951    Years since quitting:  67.7  . Smokeless tobacco: Never Used  Substance and Sexual Activity  . Alcohol use: No    Alcohol/week: 1.0 standard drinks    Types: 1 Glasses of wine per week  . Drug use: No  . Sexual activity: Never  Lifestyle  . Physical activity    Days per week: Not on file    Minutes per session: Not on file  . Stress: Not on file  Relationships  . Social Herbalist on phone: Not on file    Gets together: Not on file    Attends religious service: Not on file    Active member of club or organization: Not on file    Attends meetings of clubs or organizations: Not on file    Relationship status: Not on file  Other Topics Concern  . Not on file  Social History Narrative  . Not on file   Family History  Problem Relation Age of Onset  . Cancer Mother    - negative except otherwise stated in the family history section Allergies  Allergen Reactions  . Sulfa Antibiotics Other (See Comments)    Reaction:  Unknown   . Aspirin Other (See Comments)    Reaction:  Burning of pts stomach    Prior to Admission medications   Medication Sig Start Date End Date Taking? Authorizing  Provider  acetaminophen (TYLENOL) 500 MG tablet Take 500 mg by mouth every 6 (six) hours as needed for mild pain, moderate pain, fever or headache.  07/15/18  Yes [provider]  albuterol (PROAIR HFA) 108 (90 BASE) MCG/ACT inhaler Inhale 2 puffs into the lungs every 6 (six) hours as needed for wheezing or shortness of breath.  07/15/18  Yes [provider]  alum & mag hydroxide-simeth (MAALOX REGULAR STRENGTH) [id number] MG/5ML suspension Take 15 mLs by mouth every 6 (six) hours as needed for indigestion or heartburn. 08/16/16  Yes Kinnie Feil, PA-C  Balsam [place]-Castor Oil [place]) OINT Apply topically to sacrum and bilateral buttocks every shift and as needed for blanchable erythema 07/16/18  Yes [provider]  docusate sodium (COLACE) 100 MG capsule Take 100 mg by mouth daily.   07/15/18  Yes [provider]  magnesium hydroxide (MILK OF MAGNESIA) 400 MG/5ML suspension Take 30 mLs by mouth as needed for mild constipation.  09/15/18  Yes [provider]  morphine (ROXANOL) 20 MG/ML concentrated solution Take 5 mg by mouth 2 (two) times daily.   Yes [provider]  morphine (ROXANOL) 20 MG/ML concentrated solution Take 5 mg by mouth every 2 (two) hours as needed for severe pain.   Yes [provider]  Nutritional Supplements (ENSURE ENLIVE PO) Take 1 Bottle by mouth 3 (three) times daily between meals. 07/21/18  Yes [provider]  pantoprazole (PROTONIX) 40 MG tablet Take 40 mg by mouth every morning.    Yes [provider]  Polyethyl Glycol-Propyl Glycol (SYSTANE) 0.4-0.3 % SOLN Place 1 drop into both eyes 2 (two) times daily between meals.  10/13/18  Yes [provider]  polyethylene glycol (MIRALAX / GLYCOLAX) packet Take 17 g by mouth daily.    Yes [provider]  Wound Dressings (ALLEVYN ADHESIVE EX) Apply allevyn foam dressing topically to bilateral heels and change every 3 days and as needed for prevention 07/26/18  Yes [provider]  NON FORMULARY Diet Type:  Regular 07/26/18   [provider]   Dg Chest 1 View  Result Date: 01/08/2019 CLINICAL DATA:  Leg injury EXAM: CHEST  1 VIEW COMPARISON:  07/10/2018 FINDINGS: Severe scoliosis and thoracic cage deformity. No acute airspace disease or effusion. Stable cardiomediastinal silhouette. No pneumothorax. Old bilateral rib fractures. IMPRESSION: No active disease.  Severe scoliosis. Electronically Signed   By: Donavan Foil M.D.   On: 01/08/2019 01:21   Dg Hip Unilat W Or Wo Pelvis 2-3 Views Left  Result Date: 01/08/2019 CLINICAL DATA:  85 year old female with left lower extremity pain. EXAM: DG HIP (WITH OR WITHOUT PELVIS) 2-3V LEFT; LEFT FEMUR 2 VIEWS COMPARISON:  Left hip radiograph dated 05/09/2016 FINDINGS: There is a displaced and  angulated oblique fracture of the proximal third of the left femoral diaphysis with varus angulation of the distal fracture fragment. No definite other acute fracture identified. Old healed bilateral pubic bone fractures. There is no dislocation. There is a total right hip arthroplasty which appears intact and in anatomic alignment. There is a total left knee arthroplasty which also appears intact. The soft tissues are unremarkable. Vascular calcifications noted. IMPRESSION: Displaced and angulated oblique fracture of the proximal third of the left femoral diaphysis. Electronically Signed   By: Anner Crete M.D.   On: 01/08/2019 01:22   Dg Femur Min 2 Views Left  Result Date: 01/08/2019 CLINICAL DATA:  85 year old female with left lower extremity pain. EXAM: DG HIP (WITH OR  WITHOUT PELVIS) 2-3V LEFT; LEFT FEMUR 2 VIEWS COMPARISON:  Left hip radiograph dated 05/09/2016 FINDINGS: There is a displaced and angulated oblique fracture of the proximal third of the left femoral diaphysis with varus angulation of the distal fracture fragment. No definite other acute fracture identified. Old healed bilateral pubic bone fractures. There is no dislocation. There is a total right hip arthroplasty which appears intact and in anatomic alignment. There is a total left knee arthroplasty which also appears intact. The soft tissues are unremarkable. Vascular calcifications noted. IMPRESSION: Displaced and angulated oblique fracture of the proximal third of the left femoral diaphysis. Electronically Signed   By: Anner Crete M.D.   On: 01/08/2019 01:22   - pertinent xrays, CT, MRI studies were reviewed and independently interpreted  Positive ROS: All other systems have been reviewed and were otherwise negative with the exception of those mentioned in the HPI and as above.  Physical Exam: General: Alert, no acute distress Cardiovascular: No pedal edema Respiratory: No cyanosis, no use of accessory musculature GI:  No organomegaly, abdomen is soft and non-tender Skin: No lesions in the area of chief complaint Neurologic: Sensation intact distally Psychiatric: Patient is pleasantly demented Lymphatic: No axillary or cervical lymphadenopathy  MUSCULOSKELETAL:  - skin intact left thigh - NVI distally - in bucks traction  Assessment: Left femur fracture  Plan: - fracture is quite shortened and angulated and would not do well with nonop treatment - although patient is nonambulatory and in hospice, nonop treatment would not allow patient to be comfortably positioned or moved for hygiene care, etc and thus would cause her unnecessary severe pain and no quality of life - I have recommended surgical fixation and her daughter, [name], is in agreement to proceed after being made aware of the r/b/a to surgery - plan is for surgery Monday after medical and cardiac clearance - would recommend a formal cardiology consult today  Thank you for the consult and the opportunity to see Ms. Hoeppner  N. Eduard Roux, MD 9:43 AM

## 2019-01-09 NOTE — Progress Notes (Signed)
PROGRESS NOTE  [name] [id number] DOB: 07/04/1918 DOA: 01/07/2019 PCP: Hennie Duos, MD  Brief History:  85 year old female with a history of diastolic CHF, tachybradycardia syndrome, failure to thrive, cognitive impairment, severe protein calorie malnutrition presenting from SNF [facility]) secondary to left leg pain.  The patient was being rolled over by SNF staff when a pop was heard.  The patient developed severe pain in her left leg, and it was noted that there was shortening and deformity of her left lower extremity. The patient was brought to emergency department for further evaluation.  She has not been providing significant history secondary to cognitive impairment.  However, she is able to answer some basic questions. Presently she denies any fevers, chills, chest pain, shortness breath, abdominal pain, headache, coughing.  She complains of left leg pain. In the emergency department, the patient was afebrile hemodynamically stable saturating 96% on room air. BMP, LFTs, EKG were unremarkable. WBC was 10.3 with hemoglobin 10.8. X-ray of the left leg showed an angulated oblique fracture of the proximal third of the left femoral diaphysis. Orthopedics was consulted, but felt that the patient needed cardiac clearance given her history of tachybradycardia syndrome.  Assessment/Plan: Principal Problem:   Femur fracture (HCC) Active Problems:   Pancreatic atrophy   Chronic diastolic CHF (congestive heart failure) (HCC)   Tachy-brady syndrome (HCC)   Failure to thrive in adult   Dementia without behavioral disturbance (HCC)   Femoral neck fracture (HCC)   Left femur fracture, acute, POA, mechanical fracture during movement/transfer -Orthopedic consulted - following along - currently awaiting Cardiac/Medical clearance for surgery -Spoke with POA Gloria(Daughter) at length -currently awaiting cardiac clearance; patient's other chronic comorbid conditions appear to  be well controlled, labs otherwise unimpressive -Patient is nonambulatory at baseline, essentially bedbound but due to increased pain with transition, per orthopedic discussion previously surgery to repair fracture would improve quality of life which is the daughter's main goal at this point.  Tachy-Brady Syndrome -pt seen by EP, Dr. Curt Bears in March 2020 -PPM was deferred at that time -Cardiology to follow along here -EKG and telemetry at our facility appear to be currently well controlled not on any medication at this time for rate control  Chronic Diastolic CHF -euvolemic clinically  History of TIA -continue plavix if no surgery planned  Goals of care discussion  -Patient is pending transition to hospice house likely at discharge pending patient's daughters discussion with facility -Daughters would like to focus on quality of life which does include repair of fracture as above -Continue pain medication as required  Advanced dementia, unspecified -Patient appears to be at baseline per daughter Peter Congo over the phone, patient has "good and bad"days where she is somewhat verbal albeit confabulates and rambles.  Other days patient is nonverbal or has unintelligible speech.  Adult Failure to Thrive, severe protein caloric malnutrition -Concurrent B12 deficiency -overall poor prognosis given co-morbidities, poor po intake and new femur fracture   Disposition: Pending clinical evaluation and clearance for tentative surgery as above.  Ultimately disposition at this time appears to be hospice house per question with daughter Peter Congo over the phone today  Family Communication: Daughter Peter Congo over the phone, POA  Consultants:  Ortho, cardiology  Code Status: DNR  DVT Prophylaxis:  [place] Heparin   Procedures: Tentative ORIF, pending clearance  Antibiotics: None  Subjective: Difficult to obtain given patient's poorly verbal status, no acute issues or events reported per  staff  Objective: Vitals:   01/08/19 1336 01/08/19 2220 01/09/19 0440 01/09/19 0541  BP: (!) 96/58 (!) 111/96 (!) 94/45 (!) 109/55  Pulse: 64 62 65 68  Resp: 17 19 16    Temp: 98.2 F (36.8 C) 98.4 F (36.9 C) 98.3 F (36.8 C)   TempSrc: Oral Oral Oral   SpO2: 96% 97% 100%   Weight: 32.7 kg     Height: 4\' 5"  (1.346 m)       Intake/Output Summary (Last 24 hours) at 01/09/2019 0717 Last data filed at 01/09/2019 0450 Gross per 24 hour  Intake 245.56 ml  Output 350 ml  Net -104.44 ml   Weight change: 0 kg Exam:  General:  Pleasantly resting in bed, No acute distress.  Unable to assess orientation due to baseline mental status HEENT:  Normocephalic atraumatic.  Sclerae nonicteric, noninjected.  Extraocular movements intact bilaterally. Neck:  Without mass or deformity.  Trachea is midline. Lungs:  Clear to auscultate bilaterally without rhonchi, wheeze, or rales. Heart:  Regular rate and rhythm.  Without murmurs, rubs, or gallops. Abdomen:  Soft, nontender, nondistended.  Without guarding or rebound. Extremities: Right lower extremity in traction/brace Vascular:  Dorsalis pedis and posterior tibial pulses palpable bilaterally. Skin:  Warm and dry, no erythema, no ulcerations.  Data Reviewed: I have personally reviewed following labs and imaging studies Basic Metabolic Panel: Recent Labs  Lab 01/08/19 0335  NA 140  K 4.1  CL 109  CO2 26  GLUCOSE 106*  BUN 22  CREATININE 0.59  CALCIUM 8.3*   Liver Function Tests: Recent Labs  Lab 01/08/19 0335  AST 18  ALT 13  ALKPHOS 63  BILITOT 0.5  PROT 6.1*  ALBUMIN 3.1*   Coagulation Profile: Recent Labs  Lab 01/08/19 0335  INR 1.1   CBC: Recent Labs  Lab 01/08/19 0335  WBC 13.3*  NEUTROABS 10.5*  HGB 10.8*  HCT 35.0*  MCV 100.9*  PLT 313   Urine analysis:    Component Value Date/Time   COLORURINE YELLOW 01/08/2019 0722   APPEARANCEUR HAZY (A) 01/08/2019 0722   LABSPEC 1.012 01/08/2019 0722   PHURINE  8.0 01/08/2019 0722   GLUCOSEU NEGATIVE 01/08/2019 0722   HGBUR SMALL (A) 01/08/2019 0722   BILIRUBINUR NEGATIVE 01/08/2019 0722   KETONESUR NEGATIVE 01/08/2019 0722   PROTEINUR NEGATIVE 01/08/2019 0722   UROBILINOGEN 0.2 03/13/2015 0120   NITRITE NEGATIVE 01/08/2019 0722   LEUKOCYTESUR LARGE (A) 01/08/2019 0722    Recent Results (from the past 240 hour(s))  SARS Coronavirus 2 [facility] order, Performed in [facility] hospital lab) Nasopharyngeal Nasopharyngeal Swab     Status: None   Collection Time: 01/08/19  3:19 AM   Specimen: Nasopharyngeal Swab  Result Value Ref Range Status   SARS Coronavirus 2 NEGATIVE NEGATIVE Final    Comment: (NOTE) If result is NEGATIVE SARS-CoV-2 target nucleic acids are NOT DETECTED. The SARS-CoV-2 RNA is generally detectable in upper and lower  respiratory specimens during the acute phase of infection. The lowest  concentration of SARS-CoV-2 viral copies this assay can detect is 250  copies / mL. A negative result does not preclude SARS-CoV-2 infection  and should not be used as the sole basis for treatment or other  patient management decisions.  A negative result may occur with  improper specimen collection / handling, submission of specimen other  than nasopharyngeal swab, presence of viral mutation(s) within the  areas targeted by this assay, and inadequate number of viral copies  (<250 copies / mL). A  negative result must be combined with clinical  observations, patient history, and epidemiological information. If result is POSITIVE SARS-CoV-2 target nucleic acids are DETECTED. The SARS-CoV-2 RNA is generally detectable in upper and lower  respiratory specimens dur ing the acute phase of infection.  Positive  results are indicative of active infection with SARS-CoV-2.  Clinical  correlation with patient history and other diagnostic information is  necessary to determine patient infection status.  Positive results do  not rule out bacterial  infection or co-infection with other viruses. If result is PRESUMPTIVE POSTIVE SARS-CoV-2 nucleic acids MAY BE PRESENT.   A presumptive positive result was obtained on the submitted specimen  and confirmed on repeat testing.  While 2019 novel coronavirus  (SARS-CoV-2) nucleic acids may be present in the submitted sample  additional confirmatory testing may be necessary for epidemiological  and / or clinical management purposes  to differentiate between  SARS-CoV-2 and other Sarbecovirus currently known to infect humans.  If clinically indicated additional testing with an alternate test  methodology [phone]) is advised. The SARS-CoV-2 RNA is generally  detectable in upper and lower respiratory sp ecimens during the acute  phase of infection. The expected result is Negative. Fact Sheet for Patients:  StrictlyIdeas.no Fact Sheet for Healthcare Providers: BankingDealers.co.za This test is not yet approved or cleared by the [place] FDA and has been authorized for detection and/or diagnosis of SARS-CoV-2 by FDA under an Emergency Use Authorization (EUA).  This EUA will remain in effect (meaning this test can be used) for the duration of the COVID-19 declaration under Section 564(b)(1) of the Act, 21 U.S.C. section 360bbb-3(b)(1), unless the authorization is terminated or revoked sooner. Performed at [facility], [address], [place], [place] [address]   MRSA PCR Screening     Status: None   Collection Time: 01/08/19 11:23 AM   Specimen: Nasal Mucosa; Nasopharyngeal  Result Value Ref Range Status   MRSA by PCR NEGATIVE NEGATIVE Final    Comment:        The GeneXpert MRSA Assay (FDA approved for NASAL specimens only), is one component of a comprehensive MRSA colonization surveillance program. It is not intended to diagnose MRSA infection nor to guide or monitor treatment for MRSA infections. Performed at [facility],  [address] [place], [place] [address]      Scheduled Meds: . docusate sodium  100 mg Oral Daily  . feeding supplement (ENSURE ENLIVE)  1 Bottle Oral TID BM  . heparin  5,000 Units Subcutaneous Q8H  . morphine CONCENTRATE  5 mg Oral BID  . pantoprazole  40 mg Oral q morning - 10a  . polyethylene glycol  17 g Oral Daily  . polyvinyl alcohol  1 drop Both Eyes BID BM   Continuous Infusions: . 0.9 % NaCl with KCl 20 mEq / L 50 mL/hr at 01/09/19 [id number]    Procedures/Studies: Dg Chest 1 View  Result Date: 01/08/2019 CLINICAL DATA:  Leg injury EXAM: CHEST  1 VIEW COMPARISON:  07/10/2018 FINDINGS: Severe scoliosis and thoracic cage deformity. No acute airspace disease or effusion. Stable cardiomediastinal silhouette. No pneumothorax. Old bilateral rib fractures. IMPRESSION: No active disease.  Severe scoliosis. Electronically Signed   By: Donavan Foil M.D.   On: 01/08/2019 01:21   Dg Hip Unilat W Or Wo Pelvis 2-3 Views Left  Result Date: 01/08/2019 CLINICAL DATA:  85 year old female with left lower extremity pain. EXAM: DG HIP (WITH OR WITHOUT PELVIS) 2-3V LEFT; LEFT FEMUR 2 VIEWS COMPARISON:  Left  hip radiograph dated 05/09/2016 FINDINGS: There is a displaced and angulated oblique fracture of the proximal third of the left femoral diaphysis with varus angulation of the distal fracture fragment. No definite other acute fracture identified. Old healed bilateral pubic bone fractures. There is no dislocation. There is a total right hip arthroplasty which appears intact and in anatomic alignment. There is a total left knee arthroplasty which also appears intact. The soft tissues are unremarkable. Vascular calcifications noted. IMPRESSION: Displaced and angulated oblique fracture of the proximal third of the left femoral diaphysis. Electronically Signed   By: Anner Crete M.D.   On: 01/08/2019 01:22   Dg Femur Min 2 Views Left  Result Date: 01/08/2019 CLINICAL DATA:  85 year old female with left lower  extremity pain. EXAM: DG HIP (WITH OR WITHOUT PELVIS) 2-3V LEFT; LEFT FEMUR 2 VIEWS COMPARISON:  Left hip radiograph dated 05/09/2016 FINDINGS: There is a displaced and angulated oblique fracture of the proximal third of the left femoral diaphysis with varus angulation of the distal fracture fragment. No definite other acute fracture identified. Old healed bilateral pubic bone fractures. There is no dislocation. There is a total right hip arthroplasty which appears intact and in anatomic alignment. There is a total left knee arthroplasty which also appears intact. The soft tissues are unremarkable. Vascular calcifications noted. IMPRESSION: Displaced and angulated oblique fracture of the proximal third of the left femoral diaphysis. Electronically Signed   By: Anner Crete M.D.   On: 01/08/2019 01:22    Little Ishikawa, DO  [facility]  If 7PM-7AM, please contact night-coverage [email or web address] Password [email or web address] 01/09/2019, 7:17 AM   LOS: 1 day

## 2019-01-09 NOTE — Progress Notes (Signed)
Patient yelling, pulling arm away while starting IV. Unable to place IV at this time. RN aware.

## 2019-01-09 NOTE — Progress Notes (Signed)
Pt does not have IV access  IV team attempted twice  Informed MD

## 2019-01-09 NOTE — Progress Notes (Signed)
Patient refused IV start at this time, RN Roni stated she would replace IV consult later

## 2019-01-09 NOTE — Consult Note (Signed)
Cardiology Consultation:   Patient ID: LOLITA [name]; [id number]; 11-Aug-1918   Admit date: 01/07/2019 Date of Consult: 01/09/2019  Primary Care Provider: Hennie Duos, MD Primary Cardiologist: New to [facility] Primary Electrophysiologist:  Seen by Dr. Curt Bears during admission 07/2018.  Patient Profile:   [name] is a 85 y.o. female with a PMH of chronic diastolic CHF, tachy-brady syndrome, severe thoracic scoliosis resulting in diminished lung capacity, and demetia who is being seen today for the evaluation of preoperative assessment at the request of Dr. Avon Gully.  History of Present Illness:   [name] was in her usual state of health until 01/08/2019 when the patient experienced severe pain in her left leg when being rolled over by SNF staff. She was found to have a left femur fracture, for which orthopedics was consulted and recommended intramedullary nail placement for management of her fracture pending medical and cardiac clearance. She was transferred from [facility] to [facility] for ongoing care.   Patient was last evaluated by cardiology while admitted to the hospital 07/2018, at which time Dr. Curt Bears (EP) evaluated the patient for tachy-brady syndrome. She was monitored on telemetry with findings c/w atrial tachycardia but no atrial fibrillation. It was felt that her bradycardia was vagally mediated, therefore PPM was deferred. She was recommended to avoid AV nodal blocking agents. Echo 07/2018 with EF 66-44%, LV diastolic function was indeterminate, moderate LAE, mild MAC, mild TR and MR.  No prior ischemic evaluation on file.   Presently, she is very sleepy and unable to provide additional history.    Past Medical History:  Diagnosis Date  . Anemia   . Arthritis   . Cancer ([place])    uterine surg only  . CHF (congestive heart failure) ([place])   . Colitis 12/31/2012   presumed C.Diff. patient declined colonoscopy  . Hypotension   . Kidney stone   .  Muscle weakness (generalized)   . Osteoarthritis   . Pancreatic atrophy 12/31/2012  . Pneumonia     Past Surgical History:  Procedure Laterality Date  . ABDOMINAL HYSTERECTOMY    . CHOLECYSTECTOMY    . ESOPHAGOGASTRODUODENOSCOPY N/A 03/03/2013   RMR: Significant gastric ulcer without bleeding stigmata requiring no endoscopic intervention today. Noncritical Schatzki's ring. Small hiatal hernia  . ESOPHAGOGASTRODUODENOSCOPY N/A 08/24/2013   Dr. [name]: Hiatal hernia.  Gastric mucosal scar formation at site of previously noted gastric ulcerations  . JOINT REPLACEMENT    . LEFT KNEE SURGERY AND REVISION    . RIGHT HIP SURGERY       Home Medications:  Prior to Admission medications   Medication Sig Start Date End Date Taking? Authorizing Provider  acetaminophen (TYLENOL) 500 MG tablet Take 500 mg by mouth every 6 (six) hours as needed for mild pain, moderate pain, fever or headache.  07/15/18  Yes [provider]  albuterol (PROAIR HFA) 108 (90 BASE) MCG/ACT inhaler Inhale 2 puffs into the lungs every 6 (six) hours as needed for wheezing or shortness of breath.  07/15/18  Yes [provider]  alum & mag hydroxide-simeth (MAALOX REGULAR STRENGTH) 034-742-59 MG/5ML suspension Take 15 mLs by mouth every 6 (six) hours as needed for indigestion or heartburn. 08/16/16  Yes Kinnie Feil, PA-C  Balsam [place]) OINT Apply topically to sacrum and bilateral buttocks every shift and as needed for blanchable erythema 07/16/18  Yes [provider]  docusate sodium (COLACE) 100 MG capsule Take 100 mg by mouth daily.  07/15/18  Yes [provider]  magnesium hydroxide (MILK OF MAGNESIA) 400 MG/5ML suspension Take 30 mLs by mouth as needed for mild constipation.  09/15/18  Yes [provider]  morphine (ROXANOL) 20 MG/ML concentrated solution Take 5 mg by mouth 2 (two) times daily.   Yes [provider]  morphine (ROXANOL) 20 MG/ML concentrated  solution Take 5 mg by mouth every 2 (two) hours as needed for severe pain.   Yes [provider]  Nutritional Supplements (ENSURE ENLIVE PO) Take 1 Bottle by mouth 3 (three) times daily between meals. 07/21/18  Yes [provider]  pantoprazole (PROTONIX) 40 MG tablet Take 40 mg by mouth every morning.    Yes [provider]  Polyethyl Glycol-Propyl Glycol (SYSTANE) 0.4-0.3 % SOLN Place 1 drop into both eyes 2 (two) times daily between meals.  10/13/18  Yes [provider]  polyethylene glycol (MIRALAX / GLYCOLAX) packet Take 17 g by mouth daily.    Yes [provider]  Wound Dressings (ALLEVYN ADHESIVE EX) Apply allevyn foam dressing topically to bilateral heels and change every 3 days and as needed for prevention 07/26/18  Yes [provider]  NON FORMULARY Diet Type:  Regular 07/26/18   [provider]    Inpatient Medications: Scheduled Meds: . docusate sodium  100 mg Oral Daily  . feeding supplement (ENSURE ENLIVE)  1 Bottle Oral TID BM  . feeding supplement  296 mL Oral Once  . heparin  5,000 Units Subcutaneous Q8H  . morphine CONCENTRATE  5 mg Oral BID  . pantoprazole  40 mg Oral q morning - 10a  . polyethylene glycol  17 g Oral Daily  . polyvinyl alcohol  1 drop Both Eyes BID BM  . povidone-iodine  2 application Topical Once  . [START ON 01/10/2019] tranexamic acid (CYKLOKAPRON) topical -INTRAOP  2,000 mg Topical To OR   Continuous Infusions: . 0.9 % NaCl with KCl 20 mEq / L 50 mL/hr at 01/09/19 0650  .  ceFAZolin (ANCEF) IV    . tranexamic acid     PRN Meds: acetaminophen **OR** acetaminophen, acetaminophen, alum & mag hydroxide-simeth, magnesium hydroxide, morphine injection, morphine injection, morphine CONCENTRATE, ondansetron **OR** ondansetron (ZOFRAN) IV  Allergies:    Allergies  Allergen Reactions  . Sulfa Antibiotics Other (See Comments)    Reaction:  Unknown   . Aspirin Other (See Comments)    Reaction:   Burning of pts stomach     Social History:   Social History   Socioeconomic History  . Marital status: Widowed    Spouse name: Not on file  . Number of children: Not on file  . Years of education: Not on file  . Highest education level: Not on file  Occupational History  . Not on file  Social Needs  . Financial resource strain: Not on file  . Food insecurity    Worry: Not on file    Inability: Not on file  . Transportation needs    Medical: Not on file    Non-medical: Not on file  Tobacco Use  . Smoking status: Former Smoker    Types: Cigarettes    Quit date: 04/25/1951    Years since quitting: 67.7  . Smokeless tobacco: Never Used  Substance and Sexual Activity  . Alcohol use: No    Alcohol/week: 1.0 standard drinks    Types: 1 Glasses of wine per week  . Drug use: No  . Sexual activity: Never  Lifestyle  . Physical activity  Days per week: Not on file    Minutes per session: Not on file  . Stress: Not on file  Relationships  . Social Herbalist on phone: Not on file    Gets together: Not on file    Attends religious service: Not on file    Active member of club or organization: Not on file    Attends meetings of clubs or organizations: Not on file    Relationship status: Not on file  . Intimate partner violence    Fear of current or ex partner: Not on file    Emotionally abused: Not on file    Physically abused: Not on file    Forced sexual activity: Not on file  Other Topics Concern  . Not on file  Social History Narrative  . Not on file    Family History:    Family History  Problem Relation Age of Onset  . Cancer Mother      ROS:  Pt unable to provide  Physical Exam/Data:   Vitals:   01/09/19 0440 01/09/19 0541 01/09/19 0730 01/09/19 0820  BP: (!) 94/45 (!) 109/55 (!) 138/110   Pulse: 65 68 (!) 142 (!) 58  Resp: 16  16   Temp: 98.3 F (36.8 C)  98.5 F (36.9 C)   TempSrc: Oral  Oral   SpO2: 100%  94% 97%  Weight:       Height:        Intake/Output Summary (Last 24 hours) at 01/09/2019 1201 Last data filed at 01/09/2019 0900 Gross per 24 hour  Intake 245.56 ml  Output 350 ml  Net -104.44 ml   Filed Weights   01/07/19 2345 01/08/19 1336  Weight: 32.7 kg 32.7 kg   Body mass index is 18.02 kg/m.  General:  Very ill and cachexic,  Sleeping and arouses but not able to provide history HEENT: sclera anicteric  Cardiac:  RRR Lungs:  Breathing is not labored Abd: soft Ext: no edema Musculoskeletal:  Diffuse atrophy Skin: warm and dry  Neuro:  lethargic Psych:  Unable to assess  EKG:  The EKG was personally reviewed and demonstrates:  Sinus rhythm, rate 78, QTc 458, no STE/D, no TWI; no significant change from previous.  Telemetry:  Telemetry was personally reviewed and demonstrates:  Sinus rhythm  Relevant CV Studies: Echocardiogram 07/2018 IMPRESSIONS    1. The left ventricle has normal systolic function with an ejection fraction of 60-65%. The cavity size was normal. Left ventricular diastolic Doppler parameters are indeterminate.  2. The right ventricle has normal systolic function. The cavity was normal. There is no increase in right ventricular wall thickness.  3. Left atrial size was moderately dilated.  4. The mitral valve is normal in structure. Mild thickening of the mitral valve leaflet. There is mild mitral annular calcification present.  5. The tricuspid valve is normal in structure.  6. The aortic valve is tricuspid Mild thickening of the aortic valve.  7. The pulmonic valve was normal in structure.  8. Normal LV function; moderate LAE; mild MR and TR.   Laboratory Data:  Chemistry Recent Labs  Lab 01/08/19 0335  NA 140  K 4.1  CL 109  CO2 26  GLUCOSE 106*  BUN 22  CREATININE 0.59  CALCIUM 8.3*  GFRNONAA >60  GFRAA >60  ANIONGAP 5    Recent Labs  Lab 01/08/19 0335  PROT 6.1*  ALBUMIN 3.1*  AST 18  ALT 13  ALKPHOS 63  BILITOT 0.5   Hematology Recent Labs   Lab 01/08/19 0335  WBC 13.3*  RBC 3.47*  HGB 10.8*  HCT 35.0*  MCV 100.9*  MCH 31.1  MCHC 30.9  RDW 15.9*  PLT 313   Cardiac EnzymesNo results for input(s): TROPONINI in the last 168 hours. No results for input(s): TROPIPOC in the last 168 hours.  BNPNo results for input(s): BNP, PROBNP in the last 168 hours.  DDimer No results for input(s): DDIMER in the last 168 hours.  Radiology/Studies:  Dg Chest 1 View  Result Date: 01/08/2019 CLINICAL DATA:  Leg injury EXAM: CHEST  1 VIEW COMPARISON:  07/10/2018 FINDINGS: Severe scoliosis and thoracic cage deformity. No acute airspace disease or effusion. Stable cardiomediastinal silhouette. No pneumothorax. Old bilateral rib fractures. IMPRESSION: No active disease.  Severe scoliosis. Electronically Signed   By: Donavan Foil M.D.   On: 01/08/2019 01:21   Dg Hip Unilat W Or Wo Pelvis 2-3 Views Left  Result Date: 01/08/2019 CLINICAL DATA:  85 year old female with left lower extremity pain. EXAM: DG HIP (WITH OR WITHOUT PELVIS) 2-3V LEFT; LEFT FEMUR 2 VIEWS COMPARISON:  Left hip radiograph dated 05/09/2016 FINDINGS: There is a displaced and angulated oblique fracture of the proximal third of the left femoral diaphysis with varus angulation of the distal fracture fragment. No definite other acute fracture identified. Old healed bilateral pubic bone fractures. There is no dislocation. There is a total right hip arthroplasty which appears intact and in anatomic alignment. There is a total left knee arthroplasty which also appears intact. The soft tissues are unremarkable. Vascular calcifications noted. IMPRESSION: Displaced and angulated oblique fracture of the proximal third of the left femoral diaphysis. Electronically Signed   By: Anner Crete M.D.   On: 01/08/2019 01:22   Dg Femur Min 2 Views Left  Result Date: 01/08/2019 CLINICAL DATA:  85 year old female with left lower extremity pain. EXAM: DG HIP (WITH OR WITHOUT PELVIS) 2-3V LEFT; LEFT  FEMUR 2 VIEWS COMPARISON:  Left hip radiograph dated 05/09/2016 FINDINGS: There is a displaced and angulated oblique fracture of the proximal third of the left femoral diaphysis with varus angulation of the distal fracture fragment. No definite other acute fracture identified. Old healed bilateral pubic bone fractures. There is no dislocation. There is a total right hip arthroplasty which appears intact and in anatomic alignment. There is a total left knee arthroplasty which also appears intact. The soft tissues are unremarkable. Vascular calcifications noted. IMPRESSION: Displaced and angulated oblique fracture of the proximal third of the left femoral diaphysis. Electronically Signed   By: Anner Crete M.D.   On: 01/08/2019 01:22    Assessment and Plan:   1. Preoperative assessment: patient presented with acute left femur fracture.  She is very high risk for any procedures.  Palliative consultation may be best.  If medically indicated proceed with caution.   I do not believe that we could further reduce her surgical risks at this time.  2. Chronic diastolic dysfunction Not currently volume overloaded Caution with IVF  3. Tachy/brady Appears resolved  Cardiology team to see as needed while here. Please call with questions.   For questions or updates, please contact [place] Please consult [email or web address] for contact info under Cardiology/STEMI.   Signed, Abigail Butts, PA-C  01/09/2019 12:01 PM [phone]

## 2019-01-10 ENCOUNTER — Encounter (HOSPITAL_COMMUNITY)
Admission: EM | Disposition: A | Discharge status: Skilled Nursing Facility | Payer: Self-pay | Source: Skilled Nursing Facility | Attending: Internal Medicine

## 2019-01-10 ENCOUNTER — Encounter (HOSPITAL_COMMUNITY): Payer: Self-pay

## 2019-01-10 ENCOUNTER — Inpatient Hospital Stay (HOSPITAL_COMMUNITY): Payer: Medicare HMO

## 2019-01-10 ENCOUNTER — Inpatient Hospital Stay (HOSPITAL_COMMUNITY): Payer: Medicare HMO | Admitting: Anesthesiology

## 2019-01-10 HISTORY — PX: INTRAMEDULLARY (IM) NAIL INTERTROCHANTERIC: SHX5875

## 2019-01-10 LAB — CBC
HCT: 28.8 % — ABNORMAL LOW (ref 36.0–46.0)
Hemoglobin: 9.3 g/dL — ABNORMAL LOW (ref 12.0–15.0)
MCH: 32 pg (ref 26.0–34.0)
MCHC: 32.3 g/dL (ref 30.0–36.0)
MCV: 99 fL (ref 80.0–100.0)
Platelets: 257 10*3/uL (ref 150–400)
RBC: 2.91 MIL/uL — ABNORMAL LOW (ref 3.87–5.11)
RDW: 15.9 % — ABNORMAL HIGH (ref 11.5–15.5)
WBC: 17.7 10*3/uL — ABNORMAL HIGH (ref 4.0–10.5)
nRBC: 0 % (ref 0.0–0.2)

## 2019-01-10 LAB — CREATININE, SERUM
Creatinine, Ser: 0.8 mg/dL (ref 0.44–1.00)
GFR calc Af Amer: >60 mL/min (ref >60)
GFR calc non Af Amer: >60 mL/min (ref >60)

## 2019-01-10 SURGERY — FIXATION, FRACTURE, INTERTROCHANTERIC, WITH INTRAMEDULLARY ROD
Anesthesia: General | Laterality: Left

## 2019-01-10 MED ORDER — PHENYLEPHRINE HCL (PRESSORS) 10 MG/ML IV SOLN
INTRAVENOUS | Status: DC | PRN
  Administered 2019-01-10 (×2): 80 ug via INTRAVENOUS

## 2019-01-10 MED ORDER — ONDANSETRON HCL 4 MG/2ML IJ SOLN
INTRAMUSCULAR | Status: DC | PRN
  Administered 2019-01-10: 4 mg via INTRAVENOUS

## 2019-01-10 MED ORDER — ROCURONIUM BROMIDE 10 MG/ML (PF) SYRINGE
PREFILLED_SYRINGE | INTRAVENOUS | Status: DC | PRN
  Administered 2019-01-10: 50 mg via INTRAVENOUS

## 2019-01-10 MED ORDER — ACETAMINOPHEN 10 MG/ML IV SOLN
INTRAVENOUS | Status: DC | PRN
  Administered 2019-01-10: 490.5 mg via INTRAVENOUS

## 2019-01-10 MED ORDER — STERILE WATER FOR IRRIGATION IR SOLN
Status: DC | PRN
  Administered 2019-01-10: 1000 mL

## 2019-01-10 MED ORDER — PROPOFOL 10 MG/ML IV BOLUS
INTRAVENOUS | Status: DC | PRN
  Administered 2019-01-10: 70 mg via INTRAVENOUS

## 2019-01-10 MED ORDER — ACETAMINOPHEN 500 MG PO TABS
500.0000 mg | ORAL_TABLET | Freq: Four times a day (QID) | ORAL | Status: AC
  Administered 2019-01-10 – 2019-01-11 (×2): 500 mg via ORAL
  Filled 2019-01-10 (×2): qty 1

## 2019-01-10 MED ORDER — METHOCARBAMOL 500 MG PO TABS: 500.0000 mg | ORAL_TABLET | Freq: Four times a day (QID) | ORAL | Status: DC | PRN

## 2019-01-10 MED ORDER — DEXAMETHASONE SODIUM PHOSPHATE 10 MG/ML IJ SOLN
INTRAMUSCULAR | Status: DC | PRN
  Administered 2019-01-10: 4 mg via INTRAVENOUS

## 2019-01-10 MED ORDER — ONDANSETRON HCL 4 MG PO TABS: 4.0000 mg | ORAL_TABLET | Freq: Four times a day (QID) | ORAL | Status: DC | PRN

## 2019-01-10 MED ORDER — ACETAMINOPHEN 10 MG/ML IV SOLN
INTRAVENOUS | Status: AC
  Filled 2019-01-10: qty 100

## 2019-01-10 MED ORDER — HYDROCODONE-ACETAMINOPHEN 7.5-325 MG PO TABS: 1.0000 | ORAL_TABLET | Freq: Three times a day (TID) | ORAL | 0 refills | Status: DC | PRN

## 2019-01-10 MED ORDER — HYDROCODONE-ACETAMINOPHEN 7.5-325 MG PO TABS: 1.0000 | ORAL_TABLET | ORAL | Status: DC | PRN

## 2019-01-10 MED ORDER — ONDANSETRON HCL 4 MG/2ML IJ SOLN: 4.0000 mg | Freq: Once | INTRAMUSCULAR | Status: DC | PRN

## 2019-01-10 MED ORDER — SORBITOL 70 % SOLN: 30.0000 mL | Freq: Every day | Status: DC | PRN

## 2019-01-10 MED ORDER — ENOXAPARIN SODIUM 30 MG/0.3ML ~~LOC~~ SOLN: 30.0000 mg | SUBCUTANEOUS | 13 refills | Status: DC

## 2019-01-10 MED ORDER — METHOCARBAMOL 1000 MG/10ML IJ SOLN
500.0000 mg | Freq: Four times a day (QID) | INTRAVENOUS | Status: DC | PRN
  Filled 2019-01-10: qty 5

## 2019-01-10 MED ORDER — ENOXAPARIN SODIUM 40 MG/0.4ML ~~LOC~~ SOLN: 40.0000 mg | SUBCUTANEOUS | Status: DC

## 2019-01-10 MED ORDER — FENTANYL CITRATE (PF) 100 MCG/2ML IJ SOLN: 25.0000 ug | INTRAMUSCULAR | Status: DC | PRN

## 2019-01-10 MED ORDER — 0.9 % SODIUM CHLORIDE (POUR BTL) OPTIME
TOPICAL | Status: DC | PRN
  Administered 2019-01-10: 12:00:00 1000 mL

## 2019-01-10 MED ORDER — PROPOFOL 10 MG/ML IV BOLUS
INTRAVENOUS | Status: AC
  Filled 2019-01-10: qty 20

## 2019-01-10 MED ORDER — ONDANSETRON HCL 4 MG/2ML IJ SOLN: 4.0000 mg | Freq: Four times a day (QID) | INTRAMUSCULAR | Status: DC | PRN

## 2019-01-10 MED ORDER — LIDOCAINE 2% (20 MG/ML) 5 ML SYRINGE
INTRAMUSCULAR | Status: AC
  Filled 2019-01-10: qty 5

## 2019-01-10 MED ORDER — MAGNESIUM CITRATE PO SOLN: 1.0000 | Freq: Once | ORAL | Status: DC | PRN

## 2019-01-10 MED ORDER — DEXAMETHASONE SODIUM PHOSPHATE 10 MG/ML IJ SOLN
INTRAMUSCULAR | Status: AC
  Filled 2019-01-10: qty 1

## 2019-01-10 MED ORDER — MORPHINE SULFATE (PF) 2 MG/ML IV SOLN: 1.0000 mg | INTRAVENOUS | Status: DC | PRN

## 2019-01-10 MED ORDER — MENTHOL 3 MG MT LOZG: 1.0000 | LOZENGE | OROMUCOSAL | Status: DC | PRN

## 2019-01-10 MED ORDER — FENTANYL CITRATE (PF) 100 MCG/2ML IJ SOLN
INTRAMUSCULAR | Status: DC | PRN
  Administered 2019-01-10: 50 ug via INTRAVENOUS
  Administered 2019-01-10: 25 ug via INTRAVENOUS

## 2019-01-10 MED ORDER — ACETAMINOPHEN 325 MG PO TABS
325.0000 mg | ORAL_TABLET | Freq: Four times a day (QID) | ORAL | Status: DC | PRN
  Administered 2019-01-12: 650 mg via ORAL
  Filled 2019-01-10: qty 2

## 2019-01-10 MED ORDER — SODIUM CHLORIDE 0.9 % IV SOLN
INTRAVENOUS | Status: DC
  Administered 2019-01-10: 15:00:00 via INTRAVENOUS

## 2019-01-10 MED ORDER — MEPERIDINE HCL 25 MG/ML IJ SOLN: 6.2500 mg | INTRAMUSCULAR | Status: DC | PRN

## 2019-01-10 MED ORDER — PHENOL 1.4 % MT LIQD: 1.0000 | OROMUCOSAL | Status: DC | PRN

## 2019-01-10 MED ORDER — DOCUSATE SODIUM 100 MG PO CAPS
100.0000 mg | ORAL_CAPSULE | Freq: Two times a day (BID) | ORAL | Status: DC
  Administered 2019-01-10 – 2019-01-12 (×4): 100 mg via ORAL
  Filled 2019-01-10 (×4): qty 1

## 2019-01-10 MED ORDER — HYDROCODONE-ACETAMINOPHEN 5-325 MG PO TABS: 1.0000 | ORAL_TABLET | ORAL | Status: DC | PRN

## 2019-01-10 MED ORDER — ALUM & MAG HYDROXIDE-SIMETH 200-200-20 MG/5ML PO SUSP: 30.0000 mL | ORAL | Status: DC | PRN

## 2019-01-10 MED ORDER — FENTANYL CITRATE (PF) 250 MCG/5ML IJ SOLN
INTRAMUSCULAR | Status: AC
  Filled 2019-01-10: qty 5

## 2019-01-10 MED ORDER — ROCURONIUM BROMIDE 10 MG/ML (PF) SYRINGE
PREFILLED_SYRINGE | INTRAVENOUS | Status: AC
  Filled 2019-01-10: qty 10

## 2019-01-10 MED ORDER — SODIUM CHLORIDE 0.9 % IV SOLN
INTRAVENOUS | Status: DC | PRN
  Administered 2019-01-10: 13:00:00 30 ug/min via INTRAVENOUS

## 2019-01-10 MED ORDER — CEFAZOLIN SODIUM-DEXTROSE 2-4 GM/100ML-% IV SOLN
2.0000 g | Freq: Four times a day (QID) | INTRAVENOUS | Status: AC
  Administered 2019-01-10 – 2019-01-11 (×3): 2 g via INTRAVENOUS
  Filled 2019-01-10 (×2): qty 100

## 2019-01-10 MED ORDER — HEPARIN SODIUM (PORCINE) 5000 UNIT/ML IJ SOLN
5000.0000 [IU] | Freq: Three times a day (TID) | INTRAMUSCULAR | Status: DC
  Administered 2019-01-11 – 2019-01-12 (×4): 5000 [IU] via SUBCUTANEOUS
  Filled 2019-01-10 (×4): qty 1

## 2019-01-10 MED ORDER — LACTATED RINGERS IV SOLN
INTRAVENOUS | Status: DC
  Administered 2019-01-10 – 2019-01-11 (×2): via INTRAVENOUS

## 2019-01-10 MED ORDER — POLYETHYLENE GLYCOL 3350 17 G PO PACK: 17.0000 g | PACK | Freq: Every day | ORAL | Status: DC | PRN

## 2019-01-10 SURGICAL SUPPLY — 47 items
BIT DRILL SHORT 4.0 (BIT) IMPLANT
BNDG COHESIVE 4X5 TAN NS LF (GAUZE/BANDAGES/DRESSINGS) ×1 IMPLANT
BNDG COHESIVE 6X5 TAN STRL LF (GAUZE/BANDAGES/DRESSINGS) ×1 IMPLANT
BNDG GAUZE ELAST 4 BULKY (GAUZE/BANDAGES/DRESSINGS) ×1 IMPLANT
CLSR STERI-STRIP ANTIMIC 1/2X4 (GAUZE/BANDAGES/DRESSINGS) ×1 IMPLANT
COVER PERINEAL POST (MISCELLANEOUS) ×2 IMPLANT
COVER SURGICAL LIGHT HANDLE (MISCELLANEOUS) ×2 IMPLANT
COVER WAND RF STERILE (DRAPES) ×1 IMPLANT
DRAPE STERI IOBAN 125X83 (DRAPES) ×2 IMPLANT
DRILL BIT SHORT 4.0 (BIT) ×4
DRSG MEPILEX BORDER 4X4 (GAUZE/BANDAGES/DRESSINGS) ×2 IMPLANT
DRSG MEPILEX BORDER 4X8 (GAUZE/BANDAGES/DRESSINGS) ×2 IMPLANT
DRSG PAD ABDOMINAL 8X10 ST (GAUZE/BANDAGES/DRESSINGS) ×2 IMPLANT
DURAPREP 26ML APPLICATOR (WOUND CARE) ×2 IMPLANT
ELECT REM PT RETURN 9FT ADLT (ELECTROSURGICAL) ×2
ELECTRODE REM PT RTRN 9FT ADLT (ELECTROSURGICAL) ×1 IMPLANT
GLOVE BIOGEL PI IND STRL 7.0 (GLOVE) ×1 IMPLANT
GLOVE BIOGEL PI INDICATOR 7.0 (GLOVE) ×1
GLOVE ECLIPSE 7.0 STRL STRAW (GLOVE) ×2 IMPLANT
GLOVE SKINSENSE NS SZ7.5 (GLOVE) ×2
GLOVE SKINSENSE STRL SZ7.5 (GLOVE) ×2 IMPLANT
GOWN STRL REIN XL XLG (GOWN DISPOSABLE) ×2 IMPLANT
GUIDE PIN 3.2X343 (PIN) ×2
GUIDE PIN 3.2X343MM (PIN) ×4
GUIDE ROD 3.0 (MISCELLANEOUS) ×2
KIT BASIN OR (CUSTOM PROCEDURE TRAY) ×2 IMPLANT
KIT TURNOVER KIT B (KITS) ×2 IMPLANT
MANIFOLD NEPTUNE II (INSTRUMENTS) ×1 IMPLANT
NAIL TRIGEN INTERTAN 10S 340MM (Orthopedic Implant) ×1 IMPLANT
NS IRRIG 1000ML POUR BTL (IV SOLUTION) ×2 IMPLANT
PACK GENERAL/GYN (CUSTOM PROCEDURE TRAY) ×2 IMPLANT
PAD ARMBOARD 7.5X6 YLW CONV (MISCELLANEOUS) ×4 IMPLANT
PAD CAST 4YDX4 CTTN HI CHSV (CAST SUPPLIES) ×2 IMPLANT
PADDING CAST COTTON 4X4 STRL (CAST SUPPLIES)
PIN GUIDE 3.2X343MM (PIN) IMPLANT
ROD GUIDE 3.0 (MISCELLANEOUS) IMPLANT
SCREW LAG COMPR KIT 85/80 (Screw) ×1 IMPLANT
SCREW TRIGEN LOW PROF 5.0X35 (Screw) ×1 IMPLANT
SCREW TRIGEN LOW PROF 5.0X42.5 (Screw) ×1 IMPLANT
STAPLER VISISTAT 35W (STAPLE) ×1 IMPLANT
SUT VIC AB 0 CT1 27 (SUTURE) ×2
SUT VIC AB 0 CT1 27XBRD ANBCTR (SUTURE) ×1 IMPLANT
SUT VIC AB 2-0 CT1 27 (SUTURE) ×2
SUT VIC AB 2-0 CT1 TAPERPNT 27 (SUTURE) ×1 IMPLANT
TOWEL GREEN STERILE (TOWEL DISPOSABLE) ×2 IMPLANT
TOWEL GREEN STERILE FF (TOWEL DISPOSABLE) ×2 IMPLANT
WATER STERILE IRR 1000ML POUR (IV SOLUTION) ×2 IMPLANT

## 2019-01-10 NOTE — Discharge Instructions (Signed)
° °  Postoperative instructions:  Weightbearing instructions: as tolerated  Keep your dressing and/or splint clean and dry at all times.  You can remove your dressing on post-operative day #3 and change with a dry/sterile dressing or Band-Aids as needed thereafter.    Incision instructions:  Do not soak your incision for 3 weeks after surgery.  If the incision gets wet, pat dry and do not scrub the incision.  Pain control:  You have been given a prescription to be taken as directed for post-operative pain control.  In addition, elevate the operative extremity above the heart at all times to prevent swelling and throbbing pain.  Take over-the-counter Colace, 100mg by mouth twice a day while taking narcotic pain medications to help prevent constipation.  Follow up appointments: 1) 14 days for suture removal and wound check. 2) Dr. Kaye Luoma as scheduled.   -------------------------------------------------------------------------------------------------------------  After Surgery Pain Control:  After your surgery, post-surgical discomfort or pain is likely. This discomfort can last several days to a few weeks. At certain times of the day your discomfort may be more intense.  Did you receive a nerve block?  A nerve block can provide pain relief for one hour to two days after your surgery. As long as the nerve block is working, you will experience little or no sensation in the area the surgeon operated on.  As the nerve block wears off, you will begin to experience pain or discomfort. It is very important that you begin taking your prescribed pain medication before the nerve block fully wears off. Treating your pain at the first sign of the block wearing off will ensure your pain is better controlled and more tolerable when full-sensation returns. Do not wait until the pain is intolerable, as the medicine will be less effective. It is better to treat pain in advance than to try and catch up.  General  Anesthesia:  If you did not receive a nerve block during your surgery, you will need to start taking your pain medication shortly after your surgery and should continue to do so as prescribed by your surgeon.  Pain Medication:  Most commonly we prescribe Vicodin and Percocet for post-operative pain. Both of these medications contain a combination of acetaminophen (Tylenol) and a narcotic to help control pain.   It takes between 30 and 45 minutes before pain medication starts to work. It is important to take your medication before your pain level gets too intense.   Nausea is a common side effect of many pain medications. You will want to eat something before taking your pain medicine to help prevent nausea.   If you are taking a prescription pain medication that contains acetaminophen, we recommend that you do not take additional over the counter acetaminophen (Tylenol).  Other pain relieving options:   Using a cold pack to ice the affected area a few times a day (15 to 20 minutes at a time) can help to relieve pain, reduce swelling and bruising.   Elevation of the affected area can also help to reduce pain and swelling.  

## 2019-01-10 NOTE — Progress Notes (Signed)
Attempted this morning to start a PIV on this patient. The Patient stated she wanted to speak with the doctor first, I explained to the patient that she need a PIV for her medications and prior to her surgery the patient still stated no, she does not want any needles. I explained to the patient that the needle would only be in her arm temporary and it would be a catheter which is plastic. The patient still said no, and stated she just wanted to be left alone. I left the patient and returned 30mins later and asked the patient again to place an IV and the patient then stated get lost. I don't want no needles. MD Avon Gully was notified.

## 2019-01-10 NOTE — Anesthesia Preprocedure Evaluation (Signed)
Anesthesia Evaluation  Patient identified by MRN, date of birth, ID band Patient awake    Reviewed: Allergy & Precautions, NPO status , Patient's Chart, lab work & pertinent test results  Airway Mallampati: II  TM Distance: >3 FB Neck ROM: Full    Dental  (+) Dental Advisory Given   Pulmonary pneumonia, former smoker,    Pulmonary exam normal breath sounds clear to auscultation       Cardiovascular +CHF  Normal cardiovascular exam Rhythm:Regular Rate:Normal     Neuro/Psych PSYCHIATRIC DISORDERS Anxiety Dementia negative neurological ROS     GI/Hepatic Neg liver ROS, PUD,   Endo/Other  negative endocrine ROS  Renal/GU Renal disease     Musculoskeletal  (+) Arthritis ,   Abdominal   Peds  Hematology  (+) Blood dyscrasia, anemia ,   Anesthesia Other Findings   Reproductive/Obstetrics negative OB ROS                             Anesthesia Physical Anesthesia Plan  ASA: III  Anesthesia Plan: General   Post-op Pain Management:    Induction: Intravenous  PONV Risk Score and Plan: 4 or greater and Ondansetron, Dexamethasone and Treatment may vary due to age or medical condition  Airway Management Planned: Oral ETT  Additional Equipment: None  Intra-op Plan:   Post-operative Plan: Extubation in OR  Informed Consent: I have reviewed the patients History and Physical, chart, labs and discussed the procedure including the risks, benefits and alternatives for the proposed anesthesia with the patient or authorized representative who has indicated his/her understanding and acceptance.   Patient has DNR.  Discussed DNR with power of attorney and Suspend DNR.   Dental advisory given  Plan Discussed with: CRNA  Anesthesia Plan Comments:         Anesthesia Quick Evaluation

## 2019-01-10 NOTE — H&P (Signed)

## 2019-01-10 NOTE — Transfer of Care (Signed)
Immediate Anesthesia Transfer of Care Note  Patient: [name]  Procedure(s) Performed: INTRAMEDULLARY (IM) NAIL LEFT INTERTROCHANTRIC (Left )  Patient Location: PACU  Anesthesia Type:General  Level of Consciousness: drowsy and patient cooperative  Airway & Oxygen Therapy: Patient Spontanous Breathing and Patient connected to face mask oxygen  Post-op Assessment: Report given to RN and Post -op Vital signs reviewed and stable  Post vital signs: Reviewed and stable  Last Vitals:  Vitals Value Taken Time  BP 126/50 01/10/19 1405  Temp 36 C 01/10/19 1405  Pulse 50 01/10/19 1406  Resp 12 01/10/19 1406  SpO2 99 % 01/10/19 1406  Vitals shown include unvalidated device data.  Last Pain:  Vitals:   01/10/19 0748  TempSrc: Oral  PainSc:       Patients Stated Pain Goal: Other (Comment)(Pt unable to give pain goal at this time) ([id number])  Complications: No apparent anesthesia complications

## 2019-01-10 NOTE — Progress Notes (Signed)
PROGRESS NOTE  [name] [id number] DOB: 1919/02/20 DOA: 01/07/2019 PCP: Hennie Duos, MD  Brief History:  85 year old female with a history of diastolic CHF, tachybradycardia syndrome, failure to thrive, cognitive impairment, severe protein calorie malnutrition presenting from SNF [facility]) secondary to left leg pain.  The patient was being rolled over by SNF staff when a pop was heard.  The patient developed severe pain in her left leg, and it was noted that there was shortening and deformity of her left lower extremity. The patient was brought to emergency department for further evaluation.  She has not been providing significant history secondary to cognitive impairment.  However, she is able to answer some basic questions. Presently she denies any fevers, chills, chest pain, shortness breath, abdominal pain, headache, coughing.  She complains of left leg pain. In the emergency department, the patient was afebrile hemodynamically stable saturating 96% on room air. BMP, LFTs, EKG were unremarkable. WBC was 10.3 with hemoglobin 10.8. X-ray of the left leg showed an angulated oblique fracture of the proximal third of the left femoral diaphysis. Orthopedics was consulted, but felt that the patient needed cardiac clearance given her history of tachybradycardia syndrome.  Assessment/Plan: Principal Problem:   Displaced oblique fracture of shaft of left femur, initial encounter for closed fracture [facility]) Active Problems:   Pancreatic atrophy   Chronic diastolic CHF (congestive heart failure) (HCC)   Tachy-brady syndrome (HCC)   Failure to thrive in adult   Dementia without behavioral disturbance (HCC)    Left femur fracture, acute, POA, mechanical fracture during movement/transfer -Orthopedic consulted - following along - Cardiology indicates this patient is high risk -Surgery tentatively planned later today -Patient is nonambulatory at baseline, essentially bedbound but due  to increased pain with transition, per orthopedic discussion previously surgery to repair fracture would improve quality of life which is the daughter's main goal at this point.  Tachy-Brady Syndrome -pt seen by EP, Dr. Curt Bears in March 2020 -PPM was deferred at that time -Cardiology to follow along here -Heart rate stage in 90s - somewhat elevated during episodes of agitation which is to be expected  Chronic Diastolic CHF -euvolemic clinically  History of TIA -continue plavix if no surgery planned  Goals of care discussion  -Patient is pending transition to hospice house likely at discharge pending patient's daughters discussion with facility -Daughters would like to focus on quality of life which does include repair of fracture as above -Continue pain medication as required  Advanced dementia, unspecified -Patient appears to be at baseline per daughter Peter Congo over the phone, patient has "good and bad" days where she is somewhat verbal albeit confabulates and rambles.  Other days patient is nonverbal or has unintelligible speech. -Patient today somewhat verbally abusing - telling everyone to "get out" but in no distress; attempting to reorient she is in the hospital for her fracture  Adult Failure to Thrive, severe protein caloric malnutrition -Concurrent B12 deficiency -overall poor prognosis given co-morbidities, poor po intake and new femur fracture   Disposition: Pending clinical evaluation and clearance for tentative surgery as above.  Ultimately disposition at this time appears to be hospice house per question with daughter Peter Congo over the phone today  Family Communication: Daughter Peter Congo over the phone, POA  Consultants:  Ortho, cardiology  Code Status: DNR  DVT Prophylaxis:   Heparin   Procedures: Tentative ORIF 08/31  Antibiotics: None  Subjective: Difficult to obtain given patient's poorly verbal status, patient remains somewhat combative this morning  as  above, asking everyone to "get out" but otherwise appears in no acute distress, no acute complaints.  Objective: Vitals:   01/09/19 0820 01/09/19 1339 01/09/19 1921 01/10/19 0445  BP:  (!) 103/56 102/60 (!) 109/58  Pulse: (!) 58 87 70 65  Resp:  16 17 19   Temp:  98.8 F (37.1 C) 98.4 F (36.9 C) 98.2 F (36.8 C)  TempSrc:  Oral Oral Axillary  SpO2: 97% (!) 76% 97% 96%  Weight:      Height:        Intake/Output Summary (Last 24 hours) at 01/10/2019 0720 Last data filed at 01/10/2019 0445 Gross per 24 hour  Intake 590 ml  Output 850 ml  Net -260 ml   Weight change:  Exam:  General:  Pleasantly resting in bed, No acute distress.  Unable to assess orientation due to baseline mental status HEENT:  Normocephalic atraumatic.  Sclerae nonicteric, noninjected.  Extraocular movements intact bilaterally. Neck:  Without mass or deformity.  Trachea is midline. Lungs:  Clear to auscultate bilaterally without rhonchi, wheeze, or rales. Heart:  Regular rate and rhythm.  Without murmurs, rubs, or gallops. Abdomen:  Soft, nontender, nondistended.  Without guarding or rebound. Extremities: Right lower extremity in traction/wrap/brace Vascular:  Dorsalis pedis and posterior tibial pulses palpable bilaterally. Skin:  Warm and dry, no erythema, no ulcerations.  Data Reviewed: I have personally reviewed following labs and imaging studies  Basic Metabolic Panel: Recent Labs  Lab 01/08/19 0335  NA 140  K 4.1  CL 109  CO2 26  GLUCOSE 106*  BUN 22  CREATININE 0.59  CALCIUM 8.3*   Liver Function Tests: Recent Labs  Lab 01/08/19 0335  AST 18  ALT 13  ALKPHOS 63  BILITOT 0.5  PROT 6.1*  ALBUMIN 3.1*   Coagulation Profile: Recent Labs  Lab 01/08/19 0335  INR 1.1   CBC: Recent Labs  Lab 01/08/19 0335  WBC 13.3*  NEUTROABS 10.5*  HGB 10.8*  HCT 35.0*  MCV 100.9*  PLT 313   Urine analysis:    Component Value Date/Time   COLORURINE YELLOW 01/08/2019 0722    APPEARANCEUR HAZY (A) 01/08/2019 0722   LABSPEC 1.012 01/08/2019 0722   PHURINE 8.0 01/08/2019 0722   GLUCOSEU NEGATIVE 01/08/2019 0722   HGBUR SMALL (A) 01/08/2019 0722   BILIRUBINUR NEGATIVE 01/08/2019 0722   KETONESUR NEGATIVE 01/08/2019 0722   PROTEINUR NEGATIVE 01/08/2019 0722   UROBILINOGEN 0.2 03/13/2015 0120   NITRITE NEGATIVE 01/08/2019 0722   LEUKOCYTESUR LARGE (A) 01/08/2019 0722    Recent Results (from the past 240 hour(s))  SARS Coronavirus 2 [facility] order, Performed in [facility] hospital lab) Nasopharyngeal Nasopharyngeal Swab     Status: None   Collection Time: 01/08/19  3:19 AM   Specimen: Nasopharyngeal Swab  Result Value Ref Range Status   SARS Coronavirus 2 NEGATIVE NEGATIVE Final    Comment: (NOTE) If result is NEGATIVE SARS-CoV-2 target nucleic acids are NOT DETECTED. The SARS-CoV-2 RNA is generally detectable in upper and lower  respiratory specimens during the acute phase of infection. The lowest  concentration of SARS-CoV-2 viral copies this assay can detect is 250  copies / mL. A negative result does not preclude SARS-CoV-2 infection  and should not be used as the sole basis for treatment or other  patient management decisions.  A negative result may occur with  improper specimen collection / handling, submission of specimen other  than nasopharyngeal swab, presence of viral mutation(s) within the  areas targeted  by this assay, and inadequate number of viral copies  (<250 copies / mL). A negative result must be combined with clinical  observations, patient history, and epidemiological information. If result is POSITIVE SARS-CoV-2 target nucleic acids are DETECTED. The SARS-CoV-2 RNA is generally detectable in upper and lower  respiratory specimens dur ing the acute phase of infection.  Positive  results are indicative of active infection with SARS-CoV-2.  Clinical  correlation with patient history and other diagnostic information is  necessary to  determine patient infection status.  Positive results do  not rule out bacterial infection or co-infection with other viruses. If result is PRESUMPTIVE POSTIVE SARS-CoV-2 nucleic acids MAY BE PRESENT.   A presumptive positive result was obtained on the submitted specimen  and confirmed on repeat testing.  While 2019 novel coronavirus  (SARS-CoV-2) nucleic acids may be present in the submitted sample  additional confirmatory testing may be necessary for epidemiological  and / or clinical management purposes  to differentiate between  SARS-CoV-2 and other Sarbecovirus currently known to infect humans.  If clinically indicated additional testing with an alternate test  methodology [phone]) is advised. The SARS-CoV-2 RNA is generally  detectable in upper and lower respiratory sp ecimens during the acute  phase of infection. The expected result is Negative. Fact Sheet for Patients:  StrictlyIdeas.no Fact Sheet for Healthcare Providers: BankingDealers.co.za This test is not yet approved or cleared by the [place] FDA and has been authorized for detection and/or diagnosis of SARS-CoV-2 by FDA under an Emergency Use Authorization (EUA).  This EUA will remain in effect (meaning this test can be used) for the duration of the COVID-19 declaration under Section 564(b)(1) of the Act, 21 U.S.C. section 360bbb-3(b)(1), unless the authorization is terminated or revoked sooner. Performed at [facility], [address], [place], [place] [address]   MRSA PCR Screening     Status: None   Collection Time: 01/08/19 11:23 AM   Specimen: Nasal Mucosa; Nasopharyngeal  Result Value Ref Range Status   MRSA by PCR NEGATIVE NEGATIVE Final    Comment:        The GeneXpert MRSA Assay (FDA approved for NASAL specimens only), is one component of a comprehensive MRSA colonization surveillance program. It is not intended to diagnose MRSA infection nor to guide  or monitor treatment for MRSA infections. Performed at [facility], [address], [place],  [address]      Scheduled Meds: . docusate sodium  100 mg Oral Daily  . feeding supplement (ENSURE ENLIVE)  1 Bottle Oral TID BM  . heparin  5,000 Units Subcutaneous Q8H  . morphine CONCENTRATE  5 mg Oral BID  . pantoprazole  40 mg Oral q morning - 10a  . polyethylene glycol  17 g Oral Daily  . polyvinyl alcohol  1 drop Both Eyes BID BM  . povidone-iodine  2 application Topical Once  . tranexamic acid (CYKLOKAPRON) topical -INTRAOP  2,000 mg Topical To OR   Continuous Infusions: . 0.9 % NaCl with KCl 20 mEq / L 50 mL/hr at 01/09/19 0650  .  ceFAZolin (ANCEF) IV    . tranexamic acid      Procedures/Studies: Dg Chest 1 View  Result Date: 01/08/2019 CLINICAL DATA:  Leg injury EXAM: CHEST  1 VIEW COMPARISON:  07/10/2018 FINDINGS: Severe scoliosis and thoracic cage deformity. No acute airspace disease or effusion. Stable cardiomediastinal silhouette. No pneumothorax. Old bilateral rib fractures. IMPRESSION: No active disease.  Severe scoliosis. Electronically Signed   By: Maudie Mercury  Francoise Ceo M.D.   On: 01/08/2019 01:21   Dg Hip Unilat W Or Wo Pelvis 2-3 Views Left  Result Date: 01/08/2019 CLINICAL DATA:  85 year old female with left lower extremity pain. EXAM: DG HIP (WITH OR WITHOUT PELVIS) 2-3V LEFT; LEFT FEMUR 2 VIEWS COMPARISON:  Left hip radiograph dated 05/09/2016 FINDINGS: There is a displaced and angulated oblique fracture of the proximal third of the left femoral diaphysis with varus angulation of the distal fracture fragment. No definite other acute fracture identified. Old healed bilateral pubic bone fractures. There is no dislocation. There is a total right hip arthroplasty which appears intact and in anatomic alignment. There is a total left knee arthroplasty which also appears intact. The soft tissues are unremarkable. Vascular calcifications noted. IMPRESSION: Displaced and angulated  oblique fracture of the proximal third of the left femoral diaphysis. Electronically Signed   By: Anner Crete M.D.   On: 01/08/2019 01:22   Dg Femur Min 2 Views Left  Result Date: 01/08/2019 CLINICAL DATA:  85 year old female with left lower extremity pain. EXAM: DG HIP (WITH OR WITHOUT PELVIS) 2-3V LEFT; LEFT FEMUR 2 VIEWS COMPARISON:  Left hip radiograph dated 05/09/2016 FINDINGS: There is a displaced and angulated oblique fracture of the proximal third of the left femoral diaphysis with varus angulation of the distal fracture fragment. No definite other acute fracture identified. Old healed bilateral pubic bone fractures. There is no dislocation. There is a total right hip arthroplasty which appears intact and in anatomic alignment. There is a total left knee arthroplasty which also appears intact. The soft tissues are unremarkable. Vascular calcifications noted. IMPRESSION: Displaced and angulated oblique fracture of the proximal third of the left femoral diaphysis. Electronically Signed   By: Anner Crete M.D.   On: 01/08/2019 01:22    Little Ishikawa, DO  [facility]  If 7PM-7AM, please contact night-coverage [email or web address] Password [email or web address] 01/10/2019, 7:20 AM   LOS: 2 days

## 2019-01-10 NOTE — Plan of Care (Signed)
  Problem: Pain Managment: Goal: General experience of comfort will improve Outcome: Progressing   Problem: Safety: Goal: Ability to remain free from injury will improve Outcome: Progressing   Problem: Skin Integrity: Goal: Risk for impaired skin integrity will decrease Outcome: Progressing   

## 2019-01-10 NOTE — Anesthesia Procedure Notes (Addendum)
Procedure Name: Intubation Date/Time: 01/10/2019 12:35 PM Performed by: Kyung Rudd, CRNA Pre-anesthesia Checklist: Patient identified, Emergency Drugs available, Suction available and Patient being monitored Patient Re-evaluated:Patient Re-evaluated prior to induction Oxygen Delivery Method: Circle system utilized Preoxygenation: Pre-oxygenation with 100% oxygen Induction Type: IV induction Ventilation: Mask ventilation without difficulty Laryngoscope Size: Mac and 3 Grade View: Grade III Tube type: Oral Tube size: 7.0 mm Number of attempts: 1 Airway Equipment and Method: Stylet and Oral airway Placement Confirmation: ETT inserted through vocal cords under direct vision,  positive ETCO2 and breath sounds checked- equal and bilateral Secured at: 21 cm Tube secured with: Tape Dental Injury: Teeth and Oropharynx as per pre-operative assessment

## 2019-01-10 NOTE — Op Note (Signed)
Date of Surgery: 01/10/2019  INDICATIONS: Ms. Gagne is a 85 y.o.-year-old female who sustained a left femur fracture. The risks and benefits of the procedure discussed with the family prior to the procedure and all questions were answered; consent was obtained.  PREOPERATIVE DIAGNOSIS:  left femur fracture  POSTOPERATIVE DIAGNOSIS: Same  PROCEDURE:  left femur open treatment with intramedullary nailing.  CPT [phone]  SURGEON: N. Eduard Roux, M.D.  ASSIST: Ciro Backer [place], [place]; necessary for the timely completion of procedure and due to complexity of procedure.   ANESTHESIA:  general  IV FLUIDS AND URINE: See anesthesia record.  ESTIMATED BLOOD LOSS: minimal mL.  IMPLANTS: Smith [employer] Nephew 10 x 34 intertan.   DRAINS: None.  COMPLICATIONS: see description of procedure.  DESCRIPTION OF PROCEDURE: The patient was brought to the operating room and placed supine on the operating table.  The patient's leg had been signed prior to the procedure and this was documented.  The patient had the anesthesia placed by the anesthesiologist.  The prep verification and incision time-outs were performed to confirm that this was the correct patient, site, side and location. The patient had an SCD on the opposite lower extremity. The patient did receive antibiotics prior to the incision and was re-dosed during the procedure as needed at indicated intervals.  The patient had the lower extremity prepped and draped in the standard surgical fashion.  At the hip, the starting point was first found with the guide wire and this was inserted under fluoroscopic visualization. The guide wire was placed partially down into the femur and then the incision was made in the skin and subcutaneous tissue to the fascia and then the opening reamer was placed over this.  All radiographs were confirmed throughout the procedure on both AP and lateral views. The ball-tipped guide wire was then placed down to the distal  portion of the femur in the proper location and the measuring guide was used to measure off of this after the femur was brought out to length.  The nail was then inserted over the wire and then the guide wire was removed. The proximal lag screws were placed through the radiolucent jig.  Again, all radiographs were confirmed on both AP and lateral views.  The proximal screws were placed through the jig in the standard fashion, first incising the skin, subcutaneous tissue, and fascia, then spreading with a clamp.  The drill was placed and confirmed fluoroscopically, followed by measuring, then placing the screws by hand.  Attention was then turned to the distal interlocking screws. The lateral x-ray was used to get the perfect circles view, then an incision was made through the skin and subcutaneous tissue and fascia followed by drilling, measuring with a depth gauge, then placing the screws by hand. Final x-rays were taken on both AP and lateral views to confirm all of the screw placements.  An internal rotation view was taken at the femoral neck to confirm integrity of the neck.  The wounds were copiously irrigated with saline and then the deep fascia was closed with 0 Vicryl figure-of-eight interrupted sutures. The skin was re-approximated with 3-0 nylon horizontal mattress sutures. The wounds were cleaned and dried a final time and a sterile dressing was placed. The patient was then transferred to a bed and left the operating room in stable condition.  All counts were correct at the end of the case.    POSTOPERATIVE PLAN: Ms. Arzuaga will be WBAT and will return in 2 weeks  for suture removal.  Ms. Bladen will receive DVT prophylaxis based on other medications, activity level, and risk ratio of bleeding to thrombosis.

## 2019-01-11 ENCOUNTER — Encounter (HOSPITAL_COMMUNITY): Payer: Self-pay | Admitting: Orthopaedic Surgery

## 2019-01-11 LAB — BASIC METABOLIC PANEL
Anion gap: 7 (ref 5–15)
BUN: 19 mg/dL (ref 8–23)
CO2: 25 mmol/L (ref 22–32)
Calcium: 8.1 mg/dL — ABNORMAL LOW (ref 8.9–10.3)
Chloride: 108 mmol/L (ref 98–111)
Creatinine, Ser: 0.77 mg/dL (ref 0.44–1.00)
GFR calc Af Amer: >60 mL/min (ref >60)
GFR calc non Af Amer: >60 mL/min (ref >60)
Glucose, Bld: 127 mg/dL — ABNORMAL HIGH (ref 70–99)
Potassium: 3.9 mmol/L (ref 3.5–5.1)
Sodium: 140 mmol/L (ref 135–145)

## 2019-01-11 LAB — CBC
HCT: 25.1 % — ABNORMAL LOW (ref 36.0–46.0)
Hemoglobin: 8.1 g/dL — ABNORMAL LOW (ref 12.0–15.0)
MCH: 32 pg (ref 26.0–34.0)
MCHC: 32.3 g/dL (ref 30.0–36.0)
MCV: 99.2 fL (ref 80.0–100.0)
Platelets: 234 10*3/uL (ref 150–400)
RBC: 2.53 MIL/uL — ABNORMAL LOW (ref 3.87–5.11)
RDW: 15.9 % — ABNORMAL HIGH (ref 11.5–15.5)
WBC: 15.8 10*3/uL — ABNORMAL HIGH (ref 4.0–10.5)
nRBC: 0 % (ref 0.0–0.2)

## 2019-01-11 MED ORDER — FUROSEMIDE 10 MG/ML IJ SOLN
20.0000 mg | Freq: Once | INTRAMUSCULAR | Status: AC
  Administered 2019-01-11: 20 mg via INTRAVENOUS
  Filled 2019-01-11: qty 2

## 2019-01-11 MED ORDER — FUROSEMIDE 10 MG/ML IJ SOLN
20.0000 mg | Freq: Once | INTRAMUSCULAR | Status: AC
  Administered 2019-01-11: 11:00:00 20 mg via INTRAVENOUS
  Filled 2019-01-11: qty 2

## 2019-01-11 NOTE — Evaluation (Signed)
Occupational Therapy Evaluation Patient Details Name: [name] MRN: [id number] DOB: 1919/01/11 Today's Date: 01/11/2019    History of Present Illness 85 yo female s/p L IM nail INTERTROCHANTRIC 01/10/19 PMH: CHF, uterine CA s/p hysterectomy, arthritis, chronic pain, dementia tachycardia   Clinical Impression   Patient is s/p L IM nail intertrochantric surgery resulting in functional limitations due to the deficits listed below (see OT problem list). Pt currently easily agitated with any staff presence and does not respond to music, hand holding or warm blanket. Pt does so relief with repositioning and ice applied to L LE. Pt continued throughout session to state she wants to die. Recommending palliative consult due to pt's active participation and high risk for skin break down. Recommending pravolon (pillow) boots for bil heels and monitoring for need for air mattress.  Patient will benefit from skilled OT acutely to increase independence and safety with ADLS to allow discharge SNF.     Follow Up Recommendations  SNF    Equipment Recommendations  Hospital bed;Wheelchair (measurements OT);Wheelchair cushion (measurements OT)(youth size due to 4 feet tall)    Recommendations for Other Services Other (comment)(Palliative consult)     Precautions / Restrictions Precautions Precautions: Fall Precaution Comments: high risk for skin break down due to poor nutrition intake  Restrictions Weight Bearing Restrictions: Yes LLE Weight Bearing: Weight bearing as tolerated      Mobility Bed Mobility Overal bed mobility: Needs Assistance Bed Mobility: Rolling Rolling: Total assist         General bed mobility comments: sheets soiled with drainage from LLE wound. when attempting to educate patient the need to change linen due to [facility] pt stating "no i dont want my teeth cleaned NO !! thats how this happened no!" pt log rolled to R side to avoid pressure on L LE wound  Transfers                  General transfer comment: defer to next session- question if patient was getting OOB prior to arrival    Balance                                           ADL either performed or assessed with clinical judgement   ADL Overall ADL's : Needs assistance/impaired Eating/Feeding: Maximal assistance;Bed level Eating/Feeding Details (indicate cue type and reason): increased HOB to allow for small sips of water from cup.  Grooming: Oral care;Total assistance Grooming Details (indicate cue type and reason): pt coughing and producing thick muscus during session. patient required oral swab by therapist to clear mouth of muscus due to mouth so dry.  Upper Body Bathing: Total assistance   Lower Body Bathing: Total assistance   Upper Body Dressing : Total assistance   Lower Body Dressing: Total assistance   Toilet Transfer: Total assistance Toilet Transfer Details (indicate cue type and reason): currently bed level with peri wick           General ADL Comments: pt resistant to all staff arriving. pt states "them helping caused all this" pt recalling staff and L LE injury. Pt positioned in R side lying with blanket roll between knees and pillow under L hip. Ice applied to L hip. pt thanking therapist and reports increase comfort.      Vision         Perception     Praxis  Pertinent Vitals/Pain Pain Assessment: Faces Faces Pain Scale: Hurts whole lot Pain Location: generalized Pain Descriptors / Indicators: Crying;Grimacing;Moaning;Operative site guarding Pain Intervention(s): Monitored during session;Premedicated before session;Repositioned;Ice applied;RN gave pain meds during session;Limited activity within patient's tolerance;Utilized relaxation techniques     Hand Dominance Right   Extremity/Trunk Assessment Upper Extremity Assessment Upper Extremity Assessment: Generalized weakness RUE Deficits / Details: severe OA, when looking at  patient therapist can make out all bone landmarks due to FTT LUE Deficits / Details: Severe OT and visusally can see all bone landmarks due to FTT   Lower Extremity Assessment Lower Extremity Assessment: Defer to PT evaluation;LLE deficits/detail RLE Deficits / Details: pt scratching at L thigh and has partically d/c stable Rn made aware and new dressing applied to wound   Cervical / Trunk Assessment Cervical / Trunk Assessment: Kyphotic Cervical / Trunk Exceptions: scoliosis noted   Communication Communication Communication: HOH   Cognition Arousal/Alertness: Awake/alert Behavior During Therapy: Agitated Overall Cognitive Status: History of cognitive impairments - at baseline                                 General Comments: pt immediately on staff arrival informing therapist that they can leave. pt played music for relaxation and pt states "thats music" then says "turn it off i dont want to hear music" pt closing eyes and states "i am not going to talk" with questioning pt states "i am saving my energy to stay alive" pt required redirection to get her to engage in verbal responses. Pt becoming more agitated with increased staff present. Pt recognizes 4 staff members present during session and becoming with increased volume, frequency and higher tone to voice and telling everyone to leave her alone   General Comments  staple partically d/c by patient this session with drainage. New dressing placed by RN after therapy request. noted to have redness at heels    Exercises     Shoulder Instructions      Home Living Family/patient expects to be discharged to:: Skilled nursing facility                                 Additional Comments: pt from [facility]       Prior Functioning/Environment Level of Independence: Needs assistance    ADL's / Homemaking Assistance Needed: required (A) at SNF level.             OT Problem List: Decreased  strength;Decreased activity tolerance;Impaired balance (sitting and/or standing);Decreased cognition;Decreased safety awareness;Decreased knowledge of use of DME or AE;Decreased knowledge of precautions;Pain      OT Treatment/Interventions: Self-care/ADL training;Therapeutic exercise;Energy conservation;DME and/or AE instruction;Manual therapy;Modalities;Therapeutic activities;Cognitive remediation/compensation;Patient/family education;Balance training    OT Goals(Current goals can be found in the care plan section) Acute Rehab OT Goals Patient Stated Goal: "i just want to die" OT Goal Formulation: Patient unable to participate in goal setting Time For Goal Achievement: 01/25/19 Potential to Achieve Goals: Fair  OT Frequency: Min 2X/week   Barriers to D/C:            Co-evaluation PT/OT/SLP Co-Evaluation/Treatment: Yes Reason for Co-Treatment: Complexity of the patient's impairments (multi-system involvement);Necessary to address cognition/behavior during functional activity;For patient/therapist safety;To address functional/ADL transfers   OT goals addressed during session: ADL's and self-care;Proper use of Adaptive equipment and DME;Strengthening/ROM      AM-PAC OT "6 Clicks" Daily  Activity     Outcome Measure Help from another person eating meals?: A Lot Help from another person taking care of personal grooming?: A Lot Help from another person toileting, which includes using toliet, bedpan, or urinal?: Total Help from another person bathing (including washing, rinsing, drying)?: Total Help from another person to put on and taking off regular upper body clothing?: Total Help from another person to put on and taking off regular lower body clothing?: Total 6 Click Score: 8   End of Session Nurse Communication: Mobility status;Precautions  Activity Tolerance: Treatment limited secondary to agitation Patient left: in bed;with call bell/phone within reach;with bed alarm set  OT  Visit Diagnosis: Unsteadiness on feet (R26.81);Muscle weakness (generalized) (M62.81);Pain Pain - Right/Left: Left Pain - part of body: Leg                Time: [id number] OT Time Calculation (min): 30 min Charges:  OT General Charges $OT Visit: 1 Visit OT Evaluation $OT Eval Moderate Complexity: 1 Mod   Jeri Modena, OTR/L  Acute Rehabilitation Services Pager: [phone] Office: [phone] .   Jeri Modena 01/11/2019, 12:57 PM

## 2019-01-11 NOTE — Progress Notes (Signed)
PROGRESS NOTE  [name] [id number] DOB: 30-Mar-1919 DOA: 01/07/2019 PCP: Hennie Duos, MD  Brief History:  85 year old female with a history of diastolic CHF, tachybradycardia syndrome, failure to thrive, cognitive impairment, severe protein calorie malnutrition presenting from SNF [facility]) secondary to left leg pain.  The patient was being rolled over by SNF staff when a pop was heard.  The patient developed severe pain in her left leg, and it was noted that there was shortening and deformity of her left lower extremity. The patient was brought to emergency department for further evaluation.  She has not been providing significant history secondary to cognitive impairment.  However, she is able to answer some basic questions. Presently she denies any fevers, chills, chest pain, shortness breath, abdominal pain, headache, coughing.  She complains of left leg pain. In the emergency department, the patient was afebrile hemodynamically stable saturating 96% on room air. BMP, LFTs, EKG were unremarkable. WBC was 10.3 with hemoglobin 10.8. X-ray of the left leg showed an angulated oblique fracture of the proximal third of the left femoral diaphysis. Orthopedics was consulted, but felt that the patient needed cardiac clearance given her history of tachybradycardia syndrome.  Assessment/Plan: Principal Problem:   Displaced oblique fracture of shaft of left femur, initial encounter for closed fracture [facility]) Active Problems:   Pancreatic atrophy   Chronic diastolic CHF (congestive heart failure) (HCC)   Tachy-brady syndrome (HCC)   Failure to thrive in adult   Dementia without behavioral disturbance (HCC)   Left femur fracture, acute, POA, mechanical fracture during movement/transfer -IM nail (Left) tolerated well 01/10/19 -Ortho/Cardiology following -appreciate insight and recommendations -Patient is nonambulatory at baseline, essentially bedbound but due to increased pain with  transition, per orthopedic discussion previously surgery to repair fracture would improve quality of life which is the daughter's main goal at this point -We will move forward with hospice/palliative care consult as discussed with family previously  Tachy-Brady Syndrome, rate well controlled -pt seen by EP, Dr. Curt Bears in March 2020 -PPM was deferred at that time -Cardio following -Heart rate markedly well controlled postoperatively now that pain is well controlled  Acute on chronic anemia -Likely acute blood loss anemia from surgery on chronic anemia of chronic disease -Follow morning H&H, transfuse if less than 7 or symptomatic -Currently 8.1; Baseline appears to be around [id number]  Chronic Diastolic CHF -euvolemic clinically -encourage p.o. intake, follow I's and O's  History of TIA -continue plavix if okay with surgery to resume  Goals of care discussion  -Patient is pending transition to hospice house likely at discharge pending patient's daughters discussion with facility -Daughters would like to focus on quality of life which does include repair of fracture as above -Continue pain medication as required  Advanced dementia, unspecified -Patient appears to be at baseline per daughter Peter Congo over the phone, patient has "good and bad" days where she is somewhat verbal albeit confabulates and rambles.  Other days patient is nonverbal or has unintelligible speech. -Patient today remains more argumentative - answering "no" to any question I ask, requesting to leave the room -unclear if patient thinks she is back at home or understands that she is in the hospital at this point  Adult Failure to Thrive, severe protein caloric malnutrition -Concurrent B12 deficiency -overall poor prognosis given co-morbidities, poor po intake and new femur fracture   Disposition: Tolerated procedure well, pending pain control will defer to family and further discussion with hospice for ultimate  disposition. Family Communication:  Daughter Peter Congo over the phone, POA Consultants:  Ortho, cardiology Code Status: DNR DVT Prophylaxis:  [place] Heparin   Procedures: Left femur IM nail 08/31  Antibiotics: None  Subjective: Difficult to obtain given patient's poorly verbal status, patient remains somewhat argumentative, asking me to leave the room.  Answering no to all questions.  Objective: Vitals:   01/10/19 2342 01/11/19 0449 01/11/19 0728 01/11/19 0900  BP: (!) 107/51 (!) 108/51 (!) 106/53   Pulse: 62 68 62   Resp: 17 16 19    Temp: 97.8 F (36.6 C) 98.4 F (36.9 C) 97.9 F (36.6 C)   TempSrc: Axillary Oral Oral   SpO2: 99% 97% 99%   Weight:    35.5 kg  Height:        Intake/Output Summary (Last 24 hours) at 01/11/2019 1128 Last data filed at 01/11/2019 0929 Gross per 24 hour  Intake 760 ml  Output 1000 ml  Net -240 ml   Weight change:  Exam:  General:  Pleasantly resting in bed, No acute distress.  Unable to assess orientation due to baseline mental status HEENT:  Normocephalic atraumatic.  Sclerae nonicteric, noninjected.  Extraocular movements intact bilaterally. Neck:  Without mass or deformity.  Trachea is midline. Lungs:  Clear to auscultate bilaterally without rhonchi, wheeze, or rales. Heart:  Regular rate and rhythm.  Without murmurs, rubs, or gallops. Abdomen:  Soft, nontender, nondistended.  Without guarding or rebound. Extremities: Left hip bandage clean/dry/intact Vascular:  Dorsalis pedis and posterior tibial pulses palpable bilaterally. Skin:  Warm and dry, no erythema, no ulcerations.  Data Reviewed: I have personally reviewed following labs and imaging studies  Basic Metabolic Panel: Recent Labs  Lab 01/08/19 0335 01/10/19 1630 01/11/19 0251  NA 140  --  140  K 4.1  --  3.9  CL 109  --  108  CO2 26  --  25  GLUCOSE 106*  --  127*  BUN 22  --  19  CREATININE 0.59 0.80 0.77  CALCIUM 8.3*  --  8.1*   Liver Function Tests: Recent Labs   Lab 01/08/19 0335  AST 18  ALT 13  ALKPHOS 63  BILITOT 0.5  PROT 6.1*  ALBUMIN 3.1*   Coagulation Profile: Recent Labs  Lab 01/08/19 0335  INR 1.1   CBC: Recent Labs  Lab 01/08/19 0335 01/10/19 1630 01/11/19 0251  WBC 13.3* 17.7* 15.8*  NEUTROABS 10.5*  --   --   HGB 10.8* 9.3* 8.1*  HCT 35.0* 28.8* 25.1*  MCV 100.9* 99.0 99.2  PLT 313 257 234   Urine analysis:    Component Value Date/Time   COLORURINE YELLOW 01/08/2019 0722   APPEARANCEUR HAZY (A) 01/08/2019 0722   LABSPEC 1.012 01/08/2019 0722   PHURINE 8.0 01/08/2019 0722   GLUCOSEU NEGATIVE 01/08/2019 0722   HGBUR SMALL (A) 01/08/2019 0722   BILIRUBINUR NEGATIVE 01/08/2019 0722   KETONESUR NEGATIVE 01/08/2019 0722   PROTEINUR NEGATIVE 01/08/2019 0722   UROBILINOGEN 0.2 03/13/2015 0120   NITRITE NEGATIVE 01/08/2019 0722   LEUKOCYTESUR LARGE (A) 01/08/2019 0722    Recent Results (from the past 240 hour(s))  SARS Coronavirus 2 [facility] order, Performed in [facility] hospital lab) Nasopharyngeal Nasopharyngeal Swab     Status: None   Collection Time: 01/08/19  3:19 AM   Specimen: Nasopharyngeal Swab  Result Value Ref Range Status   SARS Coronavirus 2 NEGATIVE NEGATIVE Final    Comment: (NOTE) If result is NEGATIVE SARS-CoV-2 target nucleic acids are NOT DETECTED. The SARS-CoV-2 RNA  is generally detectable in upper and lower  respiratory specimens during the acute phase of infection. The lowest  concentration of SARS-CoV-2 viral copies this assay can detect is 250  copies / mL. A negative result does not preclude SARS-CoV-2 infection  and should not be used as the sole basis for treatment or other  patient management decisions.  A negative result may occur with  improper specimen collection / handling, submission of specimen other  than nasopharyngeal swab, presence of viral mutation(s) within the  areas targeted by this assay, and inadequate number of viral copies  (<250 copies / mL). A negative  result must be combined with clinical  observations, patient history, and epidemiological information. If result is POSITIVE SARS-CoV-2 target nucleic acids are DETECTED. The SARS-CoV-2 RNA is generally detectable in upper and lower  respiratory specimens dur ing the acute phase of infection.  Positive  results are indicative of active infection with SARS-CoV-2.  Clinical  correlation with patient history and other diagnostic information is  necessary to determine patient infection status.  Positive results do  not rule out bacterial infection or co-infection with other viruses. If result is PRESUMPTIVE POSTIVE SARS-CoV-2 nucleic acids MAY BE PRESENT.   A presumptive positive result was obtained on the submitted specimen  and confirmed on repeat testing.  While 2019 novel coronavirus  (SARS-CoV-2) nucleic acids may be present in the submitted sample  additional confirmatory testing may be necessary for epidemiological  and / or clinical management purposes  to differentiate between  SARS-CoV-2 and other Sarbecovirus currently known to infect humans.  If clinically indicated additional testing with an alternate test  methodology [phone]) is advised. The SARS-CoV-2 RNA is generally  detectable in upper and lower respiratory sp ecimens during the acute  phase of infection. The expected result is Negative. Fact Sheet for Patients:  StrictlyIdeas.no Fact Sheet for Healthcare Providers: BankingDealers.co.za This test is not yet approved or cleared by the [place] FDA and has been authorized for detection and/or diagnosis of SARS-CoV-2 by FDA under an Emergency Use Authorization (EUA).  This EUA will remain in effect (meaning this test can be used) for the duration of the COVID-19 declaration under Section 564(b)(1) of the Act, 21 U.S.C. section 360bbb-3(b)(1), unless the authorization is terminated or revoked sooner. Performed at [facility], [address], [place], [place] [address]   MRSA PCR Screening     Status: None   Collection Time: 01/08/19 11:23 AM   Specimen: Nasal Mucosa; Nasopharyngeal  Result Value Ref Range Status   MRSA by PCR NEGATIVE NEGATIVE Final    Comment:        The GeneXpert MRSA Assay (FDA approved for NASAL specimens only), is one component of a comprehensive MRSA colonization surveillance program. It is not intended to diagnose MRSA infection nor to guide or monitor treatment for MRSA infections. Performed at [facility], [address] [place], [place] [address]      Scheduled Meds: . acetaminophen  500 mg Oral Q6H  . docusate sodium  100 mg Oral BID  . feeding supplement (ENSURE ENLIVE)  1 Bottle Oral TID BM  . heparin injection (subcutaneous)  5,000 Units Subcutaneous Q8H  . morphine CONCENTRATE  5 mg Oral BID  . pantoprazole  40 mg Oral q morning - 10a  . polyethylene glycol  17 g Oral Daily  . polyvinyl alcohol  1 drop Both Eyes BID BM   Continuous Infusions: . lactated ringers 10 mL/hr at 01/11/19 1056  .  methocarbamol (ROBAXIN) IV      Procedures/Studies: Dg Chest 1 View  Result Date: 01/08/2019 CLINICAL DATA:  Leg injury EXAM: CHEST  1 VIEW COMPARISON:  07/10/2018 FINDINGS: Severe scoliosis and thoracic cage deformity. No acute airspace disease or effusion. Stable cardiomediastinal silhouette. No pneumothorax. Old bilateral rib fractures. IMPRESSION: No active disease.  Severe scoliosis. Electronically Signed   By: Donavan Foil M.D.   On: 01/08/2019 01:21   Dg C-arm 1-60 Min  Result Date: 01/10/2019 CLINICAL DATA:  INTRAMEDULLARY (IM) NAIL LEFT INTERTROCHANTRIC Dr. Durene [facility] OR room 4 Radiation safety timeout performed by Norva Riffle 2 minutes 29 seconds fluoro time 6 Images and dose summary saved to PACS EXAM: DG C-ARM 1-60 MIN; LEFT FEMUR 2 VIEWS FLUOROSCOPY TIME:  Fluoroscopy Time:  2 minutes 29 seconds Radiation Exposure Index (if provided by the fluoroscopic  device): Not provided Number of Acquired Spot Images: 6 COMPARISON:  01/08/2019 FINDINGS: Lag screws and intramedullary rod have been placed following reduction of oblique LEFT femur fracture. Cortical screws traverse the distal femur. No evidence for femoral head dislocation. IMPRESSION: ORIF LEFT femur fracture. Electronically Signed   By: Nolon Nations M.D.   On: 01/10/2019 14:58   Dg Hip Unilat W Or Wo Pelvis 2-3 Views Left  Result Date: 01/08/2019 CLINICAL DATA:  85 year old female with left lower extremity pain. EXAM: DG HIP (WITH OR WITHOUT PELVIS) 2-3V LEFT; LEFT FEMUR 2 VIEWS COMPARISON:  Left hip radiograph dated 05/09/2016 FINDINGS: There is a displaced and angulated oblique fracture of the proximal third of the left femoral diaphysis with varus angulation of the distal fracture fragment. No definite other acute fracture identified. Old healed bilateral pubic bone fractures. There is no dislocation. There is a total right hip arthroplasty which appears intact and in anatomic alignment. There is a total left knee arthroplasty which also appears intact. The soft tissues are unremarkable. Vascular calcifications noted. IMPRESSION: Displaced and angulated oblique fracture of the proximal third of the left femoral diaphysis. Electronically Signed   By: Anner Crete M.D.   On: 01/08/2019 01:22   Dg Femur Min 2 Views Left  Result Date: 01/10/2019 CLINICAL DATA:  INTRAMEDULLARY (IM) NAIL LEFT INTERTROCHANTRIC Dr. Durene [facility] OR room 4 Radiation safety timeout performed by Norva Riffle 2 minutes 29 seconds fluoro time 6 Images and dose summary saved to PACS EXAM: DG C-ARM 1-60 MIN; LEFT FEMUR 2 VIEWS FLUOROSCOPY TIME:  Fluoroscopy Time:  2 minutes 29 seconds Radiation Exposure Index (if provided by the fluoroscopic device): Not provided Number of Acquired Spot Images: 6 COMPARISON:  01/08/2019 FINDINGS: Lag screws and intramedullary rod have been placed following reduction of oblique LEFT femur  fracture. Cortical screws traverse the distal femur. No evidence for femoral head dislocation. IMPRESSION: ORIF LEFT femur fracture. Electronically Signed   By: Nolon Nations M.D.   On: 01/10/2019 14:58   Dg Femur Min 2 Views Left  Result Date: 01/08/2019 CLINICAL DATA:  85 year old female with left lower extremity pain. EXAM: DG HIP (WITH OR WITHOUT PELVIS) 2-3V LEFT; LEFT FEMUR 2 VIEWS COMPARISON:  Left hip radiograph dated 05/09/2016 FINDINGS: There is a displaced and angulated oblique fracture of the proximal third of the left femoral diaphysis with varus angulation of the distal fracture fragment. No definite other acute fracture identified. Old healed bilateral pubic bone fractures. There is no dislocation. There is a total right hip arthroplasty which appears intact and in anatomic alignment. There is a total left knee arthroplasty which also appears intact.  The soft tissues are unremarkable. Vascular calcifications noted. IMPRESSION: Displaced and angulated oblique fracture of the proximal third of the left femoral diaphysis. Electronically Signed   By: Anner Crete M.D.   On: 01/08/2019 01:22    Little Ishikawa, DO  [facility]  If 7PM-7AM, please contact night-coverage [email or web address] Password [email or web address] 01/11/2019, 11:28 AM   LOS: 3 days

## 2019-01-11 NOTE — NC FL2 (Signed)
[place] LEVEL OF CARE SCREENING TOOL     IDENTIFICATION  Patient Name: [name] Birthdate: 1919/02/06 Sex: female Admission Date (Current Location): 01/07/2019  [facility] and [place] Number:  Herbalist and Address:  [facility] [place] [place] [address] [place], [place] [address]      Provider Number: [id number]  Attending Physician Name and Address:  Little Ishikawa, MD  Relative Name and Phone Number:  Peter Congo, daughter, [phone]    Current Level of Care: Hospital Recommended Level of Care: [place] Prior Approval Number:    Date Approved/Denied:   PASRR Number: [id number]  Discharge Plan: SNF    Current Diagnoses: Patient Active Problem List   Diagnosis Date Noted  . Displaced oblique fracture of shaft of left femur, initial encounter for closed fracture ([place]) 01/08/2019  . Dementia without behavioral disturbance ([place]) 07/31/2018  . Generalized pain 07/31/2018  . Failure to thrive in adult 07/19/2018  . Generalized anxiety disorder 07/18/2018  . Tachy-brady syndrome ([place]) 07/18/2018  . Confusion 07/10/2018  . Chronic pain 07/10/2018  . Closed fracture of distal end of right humerus with routine healing 01/04/2018  . Multiple rib fractures 05/09/2016  . Falls 05/09/2016  . Leukocytosis 05/09/2016  . Chest pain 03/13/2015  . Other specified fever   . Pain in the chest   . Abdominal pain 10/28/2014  . Anemia 10/28/2014  . Hypokalemia 10/28/2014  . Protein-calorie malnutrition, severe ([place]) 10/28/2014  . Macrocytic anemia 08/13/2014  . Chronic diastolic CHF (congestive heart failure) ([place]) 08/13/2014  . PUD (peptic ulcer disease) 10/05/2013  . Acute gastric ulcer due to Helicobacter pylori [id number]  . Chronic constipation 02/23/2013  . Right kidney stone 12/31/2012  . Hypotension 12/31/2012  . Pancreatic atrophy 12/31/2012    Orientation RESPIRATION BLADDER Height & Weight      Self  Normal Continent Weight: 78 lb 4.2 oz (35.5 kg) Height:  4\' 5"  (134.6 cm)  BEHAVIORAL SYMPTOMS/MOOD NEUROLOGICAL BOWEL NUTRITION STATUS      Continent Diet(see discharge summary)  AMBULATORY STATUS COMMUNICATION OF NEEDS Skin   Total Care Verbally Surgical wounds, Other (Comment)(surgical incision on left leg with hydrocolloid; generalized ecchymosis)                       Personal Care Assistance Level of Assistance  Bathing, Feeding, Dressing Bathing Assistance: Maximum assistance Feeding assistance: Maximum assistance Dressing Assistance: Maximum assistance Total Care Assistance: Maximum assistance   Functional Limitations Info  Sight, Hearing, Speech Sight Info: Impaired Hearing Info: Impaired Speech Info: Impaired    SPECIAL CARE FACTORS FREQUENCY                       Contractures Contractures Info: Not present    Additional Factors Info  Code Status, Allergies Code Status Info: DNR Allergies Info: Sulfa Antibiotics, Aspirin           Current Medications (01/11/2019):  This is the current hospital active medication list Current Facility-Administered Medications  Medication Dose Route Frequency Provider Last Rate Last Dose  . acetaminophen (TYLENOL) tablet 325-650 mg  325-650 mg Oral Q6H PRN Leandrew Koyanagi, MD      . acetaminophen (TYLENOL) tablet 500 mg  500 mg Oral Q6H Leandrew Koyanagi, MD   500 mg at 01/11/19 1055  . alum & mag hydroxide-simeth (MAALOX/MYLANTA) 200-200-20 MG/5ML suspension 30 mL  30 mL Oral Q4H PRN Leandrew Koyanagi, MD      .  docusate sodium (COLACE) capsule 100 mg  100 mg Oral BID Leandrew Koyanagi, MD   100 mg at 01/11/19 [id number]  . feeding supplement (ENSURE ENLIVE) (ENSURE ENLIVE) liquid 237 mL  1 Bottle Oral TID BM Leandrew Koyanagi, MD   237 mL at 01/10/19 2113  . heparin injection 5,000 Units  5,000 Units Subcutaneous Q8H Blount, Lolita Cram, NP   5,000 Units at 01/11/19 [id number]  . HYDROcodone-acetaminophen (NORCO) 7.5-325 MG per tablet 1-2  tablet  1-2 tablet Oral Q4H PRN Leandrew Koyanagi, MD      . HYDROcodone-acetaminophen (NORCO/VICODIN) 5-325 MG per tablet 1-2 tablet  1-2 tablet Oral Q4H PRN Leandrew Koyanagi, MD      . lactated ringers infusion   Intravenous Continuous Leandrew Koyanagi, MD 10 mL/hr at 01/11/19 1056    . magnesium citrate solution 1 Bottle  1 Bottle Oral Once PRN Leandrew Koyanagi, MD      . magnesium hydroxide (MILK OF MAGNESIA) suspension 30 mL  30 mL Oral PRN Leandrew Koyanagi, MD      . menthol-cetylpyridinium (CEPACOL) lozenge 3 mg  1 lozenge Oral PRN Leandrew Koyanagi, MD       Or  . phenol (CHLORASEPTIC) mouth spray 1 spray  1 spray Mouth/Throat PRN Leandrew Koyanagi, MD      . methocarbamol (ROBAXIN) tablet 500 mg  500 mg Oral Q6H PRN Leandrew Koyanagi, MD       Or  . methocarbamol (ROBAXIN) 500 mg in dextrose 5 % 50 mL IVPB  500 mg Intravenous Q6H PRN Leandrew Koyanagi, MD      . morphine 2 MG/ML injection 1 mg  1 mg Intravenous Q2H PRN Leandrew Koyanagi, MD      . morphine CONCENTRATE 10 MG/0.5ML oral solution 5 mg  5 mg Oral BID Leandrew Koyanagi, MD   5 mg at 01/11/19 0854  . morphine CONCENTRATE 10 MG/0.5ML oral solution 5 mg  5 mg Oral Q2H PRN Leandrew Koyanagi, MD   5 mg at 01/11/19 [id number]  . ondansetron (ZOFRAN) tablet 4 mg  4 mg Oral Q6H PRN Leandrew Koyanagi, MD       Or  . ondansetron [facility]) injection 4 mg  4 mg Intravenous Q6H PRN Leandrew Koyanagi, MD      . pantoprazole (PROTONIX) EC tablet 40 mg  40 mg Oral q morning - 10a Leandrew Koyanagi, MD   40 mg at 01/11/19 0853  . polyethylene glycol (MIRALAX / GLYCOLAX) packet 17 g  17 g Oral Daily Leandrew Koyanagi, MD   17 g at 01/11/19 0854  . polyethylene glycol (MIRALAX / GLYCOLAX) packet 17 g  17 g Oral Daily PRN Leandrew Koyanagi, MD      . polyvinyl alcohol (LIQUIFILM TEARS) 1.4 % ophthalmic solution 1 drop  1 drop Both Eyes BID BM Leandrew Koyanagi, MD   1 drop at 01/11/19 0855  . sorbitol 70 % solution 30 mL  30 mL Oral Daily PRN Leandrew Koyanagi, MD         Discharge Medications: Please see discharge  summary for a list of discharge medications.  Relevant Imaging Results:  Relevant Lab Results:   Additional Information [id number] [place], [place]

## 2019-01-11 NOTE — Plan of Care (Signed)
?  Problem: Clinical Measurements: ?Goal: Will remain free from infection ?Outcome: Progressing ?  ?

## 2019-01-11 NOTE — Progress Notes (Signed)
Orthopedic Tech Progress Note Patient Details:  [name] 05-11-1919 [id number] Patient refused prafo boots, rn was notified. Patient ID: [name], female   DOB: 23-Oct-1918, 85 y.o.   MRN: [id number]   Braulio Bosch 01/11/2019, 4:09 PM

## 2019-01-11 NOTE — Anesthesia Postprocedure Evaluation (Signed)
Anesthesia Post Note  Patient: [name]  Procedure(s) Performed: INTRAMEDULLARY (IM) NAIL LEFT INTERTROCHANTRIC (Left )     Patient location during evaluation: PACU Anesthesia Type: General Level of consciousness: sedated and patient cooperative Pain management: pain level controlled Vital Signs Assessment: post-procedure vital signs reviewed and stable Respiratory status: spontaneous breathing Cardiovascular status: stable Anesthetic complications: no    Last Vitals:  Vitals:   01/11/19 0449 01/11/19 0728  BP: (!) 108/51 (!) 106/53  Pulse: 68 62  Resp: 16 19  Temp: 36.9 C 36.6 C  SpO2: 97% 99%    Last Pain:  Vitals:   01/11/19 0728  TempSrc: Oral  PainSc:                  Nolon Nations

## 2019-01-11 NOTE — Evaluation (Signed)
Physical Therapy Evaluation Patient Details Name: [name] MRN: [id number] DOB: 01/10/1919 Today's Date: 01/11/2019   History of Present Illness  85 yo female s/p L IM nail INTERTROCHANTRIC 01/10/19 PMH: CHF, uterine CA s/p hysterectomy, arthritis, chronic pain, dementia tachycardia  Clinical Impression   Patient is s/p L IM nail intertrochantric surgery resulting in functional limitations due to weakness, pain, poor activity tolerance and cognition. Pt currently easily agitated with any staff presence and does not respond to positive reinforcement and encouragement. Pt does find relief with repositioning and ice applied to L LE. Pt continued throughout session to state she wants to die. Recommending palliative consult due to pt's active participation and high risk for skin break down. Patient will benefit from skilled PT acutely to increase independence and safety with functional mobility to allow discharge SNF.    Follow Up Recommendations SNF;Supervision/Assistance - 24 hour    Equipment Recommendations  Other (comment)(tbd)    Recommendations for Other Services       Precautions / Restrictions Precautions Precautions: Fall Precaution Comments: high risk for skin break down due to poor nutrition intake  Restrictions Weight Bearing Restrictions: Yes LLE Weight Bearing: Weight bearing as tolerated      Mobility  Bed Mobility Overal bed mobility: Needs Assistance Bed Mobility: Rolling Rolling: Total assist         General bed mobility comments: sheets soiled with drainage from LLE wound. when attempting to educate patient the need to change linen due to [facility] pt stating "no i dont want my teeth cleaned NO !! thats how this happened no!" pt log rolled to R side to avoid pressure on L LE wound  Transfers                 General transfer comment: defer to next session- question if patient was getting OOB prior to arrival  Ambulation/Gait                 Stairs            Wheelchair Mobility    Modified Rankin (Stroke Patients Only)       Balance                                             Pertinent Vitals/Pain Pain Assessment: Faces Faces Pain Scale: Hurts whole lot Pain Location: generalized Pain Descriptors / Indicators: Crying;Grimacing;Moaning;Operative site guarding Pain Intervention(s): Monitored during session;Repositioned;Ice applied;Utilized relaxation techniques;Limited activity within patient's tolerance    Home Living Family/patient expects to be discharged to:: Skilled nursing facility                 Additional Comments: pt from [facility]     Prior Function Level of Independence: Needs assistance      ADL's / Homemaking Assistance Needed: required (A) at SNF level.         Hand Dominance   Dominant Hand: Right    Extremity/Trunk Assessment   Upper Extremity Assessment Upper Extremity Assessment: Generalized weakness RUE Deficits / Details: severe OA, when looking at patient therapist can make out all bone landmarks due to FTT RUE Coordination: decreased fine motor LUE Deficits / Details: Severe OT and visusally can see all bone landmarks due to FTT LUE Coordination: decreased fine motor    Lower Extremity Assessment Lower Extremity Assessment: Difficult to assess due to impaired cognition RLE Deficits /  Details: pt scratching at L thigh and has partically d/c stable Rn made aware and new dressing applied to wound    Cervical / Trunk Assessment Cervical / Trunk Assessment: Kyphotic Cervical / Trunk Exceptions: scoliosis noted  Communication   Communication: HOH  Cognition Arousal/Alertness: Awake/alert Behavior During Therapy: Agitated Overall Cognitive Status: History of cognitive impairments - at baseline                                 General Comments: pt immediately informs therapist that they can leave. played music for relaxation and  pt states "thats music" then says "turn it off i dont want to hear music" pt closing eyes and states "i am not going to talk" with questioning pt states "i am saving my energy to stay alive" pt required redirection to get her to engage in verbal responses. Pt becoming more agitated with increased staff present. Pt recognizes 4 staff members present during session and becoming with increased volume, frequency and higher tone to voice and telling everyone to leave her alone. Used redirection and relaxation strategies in order to calm the pt throughout session      General Comments General comments (skin integrity, edema, etc.): Notes partial removal of staples by pt this session with drainage, new dressing placed by RN after therapy request. noted to have redness at heels and sacrum    Exercises     Assessment/Plan    PT Assessment Patient needs continued PT services  PT Problem List Decreased strength;Decreased mobility;Pain;Decreased activity tolerance;Decreased cognition;Decreased safety awareness       PT Treatment Interventions Functional mobility training;Therapeutic exercise;Therapeutic activities;Wheelchair mobility training    PT Goals (Current goals can be found in the Care Plan section)  Acute Rehab PT Goals Patient Stated Goal: "i just want to die" PT Goal Formulation: With patient Time For Goal Achievement: 01/25/19 Potential to Achieve Goals: Fair    Frequency Min 3X/week   Barriers to discharge Decreased caregiver support      Co-evaluation   Reason for Co-Treatment: Complexity of the patient's impairments (multi-system involvement)   OT goals addressed during session: ADL's and self-care;Proper use of Adaptive equipment and DME;Strengthening/ROM       AM-PAC PT "6 Clicks" Mobility  Outcome Measure Help needed turning from your back to your side while in a flat bed without using bedrails?: Total Help needed moving from lying on your back to sitting on the side  of a flat bed without using bedrails?: Total Help needed moving to and from a bed to a chair (including a wheelchair)?: Total Help needed standing up from a chair using your arms (e.g., wheelchair or bedside chair)?: Total Help needed to walk in hospital room?: Total Help needed climbing 3-5 steps with a railing? : Total 6 Click Score: 6    End of Session   Activity Tolerance: Patient limited by pain;Patient limited by fatigue Patient left: in bed;with bed alarm set;with call bell/phone within reach Nurse Communication: Mobility status PT Visit Diagnosis: Muscle weakness (generalized) (M62.81);Pain;Other abnormalities of gait and mobility (R26.89) Pain - Right/Left: Left    Time: [id number] PT Time Calculation (min) (ACUTE ONLY): 28 min   Charges:   PT Evaluation $PT Eval Moderate Complexity: 1 Mod          Netta Corrigan, PT, DPT Acute Rehab Office [place] 01/11/2019, 1:16 PM

## 2019-01-11 NOTE — TOC Initial Note (Signed)
Transition of Care [facility]) - Initial/Assessment Note    Patient Details  Name: [name] MRN: [id number] Date of Birth: 05/05/1919  Transition of [facility]) CM/SW Contact:    Marilu Favre, RN Phone Number: 01/11/2019, 1:10 PM  Clinical Narrative:                 Patient from [facility], per MD plan to have palliative involved and return to [facility] tomorrow with Hospice following.   Spoke to [employer] at [facility] . Marianna Fuss has already discussed same with patient's daughter Peter Congo. Patient can return tomorrow. Covid test dated 01/08/19 is accepted , no need for new covid test.   Will talk to [facility] after palliative speaks to her.   Expected Discharge Plan: [place]     Patient Goals and CMS Choice     Choice offered to / list presented to : Patient, Adult Children  Expected Discharge Plan and Services Expected Discharge Plan: [place] arrangements for the past 2 months: [place]                 DME Arranged: N/A         HH Arranged: NA          Prior Living Arrangements/Services Living arrangements for the past 2 months: [place]                     Activities of Daily Living Home Assistive Devices/Equipment: Wheelchair, Hospital bed ADL Screening (condition at time of admission) Patient's cognitive ability adequate to safely complete daily activities?: Yes Is the patient deaf or have difficulty hearing?: Yes Does the patient have difficulty seeing, even when wearing glasses/contacts?: Yes Does the patient have difficulty concentrating, remembering, or making decisions?: Yes Patient able to express need for assistance with ADLs?: Yes Does the patient have difficulty dressing or bathing?: Yes Independently performs ADLs?: No Communication: Independent Dressing (OT): Dependent Is this a change from baseline?: Pre-admission baseline Grooming: Dependent Is this a  change from baseline?: Pre-admission baseline Feeding: Needs assistance Is this a change from baseline?: Pre-admission baseline Bathing: Dependent Is this a change from baseline?: Pre-admission baseline Toileting: Dependent Is this a change from baseline?: Pre-admission baseline Walks in Home: Dependent Is this a change from baseline?: Pre-admission baseline Does the patient have difficulty walking or climbing stairs?: Yes Weakness of Legs: Both Weakness of Arms/Hands: Both  Permission Sought/Granted                  Emotional Assessment              Admission diagnosis:  Closed displaced oblique fracture of shaft of left femur, initial encounter ([place]) [S72.332A] 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 Displaced oblique fracture of shaft of left femur, initial encounter for closed fracture ([place]) 01/08/2019  . Dementia without behavioral disturbance ([place]) 07/31/2018  . Generalized pain 07/31/2018  . Failure to thrive in adult 07/19/2018  . Generalized anxiety disorder 07/18/2018  . Tachy-brady syndrome ([place]) 07/18/2018  . Confusion 07/10/2018  . Chronic pain 07/10/2018  . Closed fracture of distal end of right humerus with routine healing 01/04/2018  . Multiple rib fractures 05/09/2016  . Falls 05/09/2016  . Leukocytosis 05/09/2016  . Chest pain 03/13/2015  . Other specified fever   .  Pain in the chest   . Abdominal pain 10/28/2014  . Anemia 10/28/2014  . Hypokalemia 10/28/2014  . Protein-calorie malnutrition, severe ([place]) 10/28/2014  . Macrocytic anemia 08/13/2014  . Chronic diastolic CHF (congestive heart failure) ([place]) 08/13/2014  . PUD (peptic ulcer disease) 10/05/2013  . Acute gastric ulcer due to Helicobacter pylori [id number]  . Chronic constipation 02/23/2013  . Right kidney stone 12/31/2012  .  Hypotension 12/31/2012  . Pancreatic atrophy 12/31/2012   PCP:  Hennie Duos, MD Pharmacy:   [facility] [address] [place], [place] [address] Phone: [phone] Fax: [phone]     Social Determinants of Health (SDOH) Interventions    Readmission Risk Interventions No flowsheet data found.

## 2019-01-11 NOTE — Progress Notes (Signed)
Patient refused eye drops, became very agitated at any interaction.  OT notified of space boot orders.  Patient has had very poor PO intake take, meals and Ensure Enlive offered with poor acceptance.

## 2019-01-11 NOTE — Progress Notes (Signed)
Subjective: 1 Day Post-Op Procedure(s) (LRB): INTRAMEDULLARY (IM) NAIL LEFT INTERTROCHANTRIC (Left) Patient reports pain as mild.  Feeling ok this am.   Objective: Vital signs in last 24 hours: Temp:  [96.8 F (36 C)-98.4 F (36.9 C)] 97.9 F (36.6 C) (09/01 0728) Pulse Rate:  [51-68] 62 (09/01 0728) Resp:  [11-19] 19 (09/01 0728) BP: (106-128)/(42-64) 106/53 (09/01 0728) SpO2:  [92 %-100 %] 99 % (09/01 0728) Weight:  [32.7 kg] 32.7 kg (08/31 1144)  Intake/Output from previous day: 08/31 0701 - 09/01 0700 In: 700 [I.V.:700] Out: 1000 [Urine:850; Blood:150] Intake/Output this shift: No intake/output data recorded.  Recent Labs    01/10/19 1630 01/11/19 0251  HGB 9.3* 8.1*   Recent Labs    01/10/19 1630 01/11/19 0251  WBC 17.7* 15.8*  RBC 2.91* 2.53*  HCT 28.8* 25.1*  PLT 257 234   Recent Labs    01/10/19 1630 01/11/19 0251  NA  --  140  K  --  3.9  CL  --  108  CO2  --  25  BUN  --  19  CREATININE 0.80 0.77  GLUCOSE  --  127*  CALCIUM  --  8.1*   No results for input(s): LABPT, INR in the last 72 hours.  Neurologically intact Neurovascular intact Sensation intact distally Intact pulses distally Dorsiflexion/Plantar flexion intact Incision: dressing C/D/I No cellulitis present Compartment soft   Assessment/Plan: 1 Day Post-Op Procedure(s) (LRB): INTRAMEDULLARY (IM) NAIL LEFT INTERTROCHANTRIC (Left) Up with therapy  WBAT LLE DVT ppx- Lovenox 30 daily x 14 days D/c dispo per medicine F/u with Dr. Erlinda Hong 2 weeks post-op for staple removal      [name] 01/11/2019, 7:30 AM

## 2019-01-11 NOTE — Progress Notes (Addendum)
Progress Note  Patient Name: [name] Date of Encounter: 01/11/2019  Primary Cardiologist:  Ena Dawley, MD  Subjective   Very weak, denies chest pain or SOB. VERY HOH. Oriented to name. Complains of pain in L leg.  Inpatient Medications    Scheduled Meds: . acetaminophen  500 mg Oral Q6H  . docusate sodium  100 mg Oral BID  . feeding supplement (ENSURE ENLIVE)  1 Bottle Oral TID BM  . heparin injection (subcutaneous)  5,000 Units Subcutaneous Q8H  . morphine CONCENTRATE  5 mg Oral BID  . pantoprazole  40 mg Oral q morning - 10a  . polyethylene glycol  17 g Oral Daily  . polyvinyl alcohol  1 drop Both Eyes BID BM   Continuous Infusions: . sodium chloride 75 mL/hr at 01/10/19 1524  . 0.9 % NaCl with KCl 20 mEq / L 50 mL/hr at 01/09/19 0650  . lactated ringers 10 mL/hr at 01/10/19 1141  . methocarbamol (ROBAXIN) IV     PRN Meds: acetaminophen, alum & mag hydroxide-simeth, HYDROcodone-acetaminophen, HYDROcodone-acetaminophen, magnesium citrate, magnesium hydroxide, menthol-cetylpyridinium **OR** phenol, methocarbamol **OR** methocarbamol (ROBAXIN) IV, morphine injection, morphine CONCENTRATE, ondansetron **OR** ondansetron (ZOFRAN) IV, polyethylene glycol, sorbitol   Vital Signs    Vitals:   01/10/19 1919 01/10/19 2342 01/11/19 0449 01/11/19 0728  BP: (!) 113/52 (!) 107/51 (!) 108/51 (!) 106/53  Pulse: (!) 59 62 68 62  Resp: 15 17 16 19   Temp: 97.8 F (36.6 C) 97.8 F (36.6 C) 98.4 F (36.9 C) 97.9 F (36.6 C)  TempSrc: Oral Axillary Oral Oral  SpO2: 92% 99% 97% 99%  Weight:      Height:        Intake/Output Summary (Last 24 hours) at 01/11/2019 0843 Last data filed at 01/11/2019 0500 Gross per 24 hour  Intake 700 ml  Output 1000 ml  Net -300 ml   Filed Weights   01/07/19 2345 01/08/19 1336 01/10/19 1144  Weight: 32.7 kg 32.7 kg 32.7 kg   Last Weight  Most recent update: 01/10/2019 11:44 AM   Weight  32.7 kg (72 lb)           Weight change:     Telemetry    SR, no ectopy noted - Personally Reviewed  ECG    None today - Personally Reviewed  Physical Exam   General: Well developed, frail, elderly, female appearing in no acute distress. Head: Normocephalic, atraumatic.  Neck: Supple without bruits, JVD 9-10 cm. Lungs:  Resp regular and unlabored, rales bases. Heart: RRR, S1, S2, no S3, S4, 2/6 murmur; no rub. Abdomen: Soft, non-tender, non-distended with normoactive bowel sounds. No hepatomegaly. No rebound/guarding. No obvious abdominal masses. Extremities: No clubbing, cyanosis, no edema. Distal pedal pulses are 1-2+ bilaterally. Neuro: Alert and oriented X 1. Moves all extremities spontaneously except L leg.  Labs    Hematology Recent Labs  Lab 01/08/19 0335 01/10/19 1630 01/11/19 0251  WBC 13.3* 17.7* 15.8*  RBC 3.47* 2.91* 2.53*  HGB 10.8* 9.3* 8.1*  HCT 35.0* 28.8* 25.1*  MCV 100.9* 99.0 99.2  MCH 31.1 32.0 32.0  MCHC 30.9 32.3 32.3  RDW 15.9* 15.9* 15.9*  PLT 313 257 234    Chemistry Recent Labs  Lab 01/08/19 0335 01/10/19 1630 01/11/19 0251  NA 140  --  140  K 4.1  --  3.9  CL 109  --  108  CO2 26  --  25  GLUCOSE 106*  --  127*  BUN 22  --  19  CREATININE 0.59 0.80 0.77  CALCIUM 8.3*  --  8.1*  PROT 6.1*  --   --   ALBUMIN 3.1*  --   --   AST 18  --   --   ALT 13  --   --   ALKPHOS 63  --   --   BILITOT 0.5  --   --   GFRNONAA >60 >60 >60  GFRAA >60 >60 >60  ANIONGAP 5  --  7     Radiology    Dg Chest 1 View  Result Date: 01/08/2019 CLINICAL DATA:  Leg injury EXAM: CHEST  1 VIEW COMPARISON:  07/10/2018 FINDINGS: Severe scoliosis and thoracic cage deformity. No acute airspace disease or effusion. Stable cardiomediastinal silhouette. No pneumothorax. Old bilateral rib fractures. IMPRESSION: No active disease.  Severe scoliosis. Electronically Signed   By: Donavan Foil M.D.   On: 01/08/2019 01:21   Dg C-arm 1-60 Min  Result Date: 01/10/2019 CLINICAL DATA:  INTRAMEDULLARY (IM)  NAIL LEFT INTERTROCHANTRIC Dr. Durene Cal [facility] OR room 4 Radiation safety timeout performed by Norva Riffle 2 minutes 29 seconds fluoro time 6 Images and dose summary saved to PACS EXAM: DG C-ARM 1-60 MIN; LEFT FEMUR 2 VIEWS FLUOROSCOPY TIME:  Fluoroscopy Time:  2 minutes 29 seconds Radiation Exposure Index (if provided by the fluoroscopic device): Not provided Number of Acquired Spot Images: 6 COMPARISON:  01/08/2019 FINDINGS: Lag screws and intramedullary rod have been placed following reduction of oblique LEFT femur fracture. Cortical screws traverse the distal femur. No evidence for femoral head dislocation. IMPRESSION: ORIF LEFT femur fracture. Electronically Signed   By: Nolon Nations M.D.   On: 01/10/2019 14:58   Dg Hip Unilat W Or Wo Pelvis 2-3 Views Left  Result Date: 01/08/2019 CLINICAL DATA:  85 year old female with left lower extremity pain. EXAM: DG HIP (WITH OR WITHOUT PELVIS) 2-3V LEFT; LEFT FEMUR 2 VIEWS COMPARISON:  Left hip radiograph dated 05/09/2016 FINDINGS: There is a displaced and angulated oblique fracture of the proximal third of the left femoral diaphysis with varus angulation of the distal fracture fragment. No definite other acute fracture identified. Old healed bilateral pubic bone fractures. There is no dislocation. There is a total right hip arthroplasty which appears intact and in anatomic alignment. There is a total left knee arthroplasty which also appears intact. The soft tissues are unremarkable. Vascular calcifications noted. IMPRESSION: Displaced and angulated oblique fracture of the proximal third of the left femoral diaphysis. Electronically Signed   By: Anner Crete M.D.   On: 01/08/2019 01:22   Dg Femur Min 2 Views Left  Result Date: 01/10/2019 CLINICAL DATA:  INTRAMEDULLARY (IM) NAIL LEFT INTERTROCHANTRIC Dr. Durene Cal [facility] OR room 4 Radiation safety timeout performed by Norva Riffle 2 minutes 29 seconds fluoro time 6 Images and dose summary saved to PACS EXAM:  DG C-ARM 1-60 MIN; LEFT FEMUR 2 VIEWS FLUOROSCOPY TIME:  Fluoroscopy Time:  2 minutes 29 seconds Radiation Exposure Index (if provided by the fluoroscopic device): Not provided Number of Acquired Spot Images: 6 COMPARISON:  01/08/2019 FINDINGS: Lag screws and intramedullary rod have been placed following reduction of oblique LEFT femur fracture. Cortical screws traverse the distal femur. No evidence for femoral head dislocation. IMPRESSION: ORIF LEFT femur fracture. Electronically Signed   By: Nolon Nations M.D.   On: 01/10/2019 14:58   Dg Femur Min 2 Views Left  Result Date: 01/08/2019 CLINICAL DATA:  85 year old female with left lower extremity pain. EXAM: DG HIP (WITH  OR WITHOUT PELVIS) 2-3V LEFT; LEFT FEMUR 2 VIEWS COMPARISON:  Left hip radiograph dated 05/09/2016 FINDINGS: There is a displaced and angulated oblique fracture of the proximal third of the left femoral diaphysis with varus angulation of the distal fracture fragment. No definite other acute fracture identified. Old healed bilateral pubic bone fractures. There is no dislocation. There is a total right hip arthroplasty which appears intact and in anatomic alignment. There is a total left knee arthroplasty which also appears intact. The soft tissues are unremarkable. Vascular calcifications noted. IMPRESSION: Displaced and angulated oblique fracture of the proximal third of the left femoral diaphysis. Electronically Signed   By: Anner Crete M.D.   On: 01/08/2019 01:22     Cardiac Studies   ECHO:  07/11/2018  1. The left ventricle has normal systolic function with an ejection fraction of 60-65%. The cavity size was normal. Left ventricular diastolic Doppler parameters are indeterminate.  2. The right ventricle has normal systolic function. The cavity was normal. There is no increase in right ventricular wall thickness.  3. Left atrial size was moderately dilated.  4. The mitral valve is normal in structure. Mild thickening of the  mitral valve leaflet. There is mild mitral annular calcification present.  5. The tricuspid valve is normal in structure.  6. The aortic valve is tricuspid Mild thickening of the aortic valve.  7. The pulmonic valve was normal in structure.  8. Normal LV function; moderate LAE; mild MR and TR.   Patient Profile     85 y.o. female w/ hx D-CHF, tachy-brady syndrome, severe thoracic scoliosis w/ decreased lung capacity, dementia, HOH, was admitted 08/29 after a fall w/ L femur fx. Cards saw preop.  Assessment & Plan    1. Chronic diastolic CHF - will decrease IVF since she is taking po's - she was not on a diuretic at home, with decreased lung capacity, she will not tolerate extra volume - make sure to get daily wts - O2 sats are good on room air, pt denies SOB - However, some volume overload on exam, wt up 6 lbs, will give Lasix 20 mg IV x 1 - call Cardiology for increased SOB, otherwise, will sign off.   Principal Problem:   Displaced oblique fracture of shaft of left femur, initial encounter for closed fracture [facility]) Active Problems:   Pancreatic atrophy   Chronic diastolic CHF (congestive heart failure) (HCC)   Tachy-brady syndrome ([place])   Failure to thrive in adult   Dementia without behavioral disturbance ([place])  Signed, Rosaria Ferries , PA-C 8:43 AM 01/11/2019 Pager: [phone]  The patient was seen, examined and discussed with Rosaria Ferries, PA-C and I agree with the above.   The patient appears euvolemic, comfortable, but weight up, I would discontinue iv fluids and give 1 dose of iv lasix 20 mg x1.   [facility] will sign off.   Medication Recommendations:  As above Other recommendations (labs, testing, etc):  No further testing Follow up as an outpatient:  As needed   Ena Dawley, MD 01/11/2019

## 2019-01-12 ENCOUNTER — Inpatient Hospital Stay
Admission: RE | Admit: 2019-01-12 | Discharge: 2019-02-15 | Disposition: A | Discharge status: Other Acute Inpatient Hospital | Payer: Medicare HMO | Source: Ambulatory Visit | Attending: Internal Medicine | Admitting: Internal Medicine

## 2019-01-12 LAB — BASIC METABOLIC PANEL
Anion gap: 13 (ref 5–15)
BUN: 23 mg/dL (ref 8–23)
CO2: 26 mmol/L (ref 22–32)
Calcium: 8.5 mg/dL — ABNORMAL LOW (ref 8.9–10.3)
Chloride: 103 mmol/L (ref 98–111)
Creatinine, Ser: 0.78 mg/dL (ref 0.44–1.00)
GFR calc Af Amer: >60 mL/min (ref >60)
GFR calc non Af Amer: >60 mL/min (ref >60)
Glucose, Bld: 92 mg/dL (ref 70–99)
Potassium: 3.1 mmol/L — ABNORMAL LOW (ref 3.5–5.1)
Sodium: 142 mmol/L (ref 135–145)

## 2019-01-12 LAB — CBC
HCT: 24.4 % — ABNORMAL LOW (ref 36.0–46.0)
Hemoglobin: 7.9 g/dL — ABNORMAL LOW (ref 12.0–15.0)
MCH: 31.3 pg (ref 26.0–34.0)
MCHC: 32.4 g/dL (ref 30.0–36.0)
MCV: 96.8 fL (ref 80.0–100.0)
Platelets: 267 10*3/uL (ref 150–400)
RBC: 2.52 MIL/uL — ABNORMAL LOW (ref 3.87–5.11)
RDW: 15.7 % — ABNORMAL HIGH (ref 11.5–15.5)
WBC: 13.5 10*3/uL — ABNORMAL HIGH (ref 4.0–10.5)
nRBC: 0 % (ref 0.0–0.2)

## 2019-01-12 NOTE — Progress Notes (Signed)
Patient has ripped out her iv and keeps taking off her telemetry leads.  Patient becomes very agitated when staff have to come in and replace telemetry leads back on.

## 2019-01-12 NOTE — Progress Notes (Signed)
Subjective: 2 Days Post-Op Procedure(s) (LRB): INTRAMEDULLARY (IM) NAIL LEFT INTERTROCHANTRIC (Left) Patient reports pain as mild.    Objective: Vital signs in last 24 hours: Temp:  [98.4 F (36.9 C)-98.7 F (37.1 C)] 98.5 F (36.9 C) (09/02 0730) Pulse Rate:  [58-95] 77 (09/02 0730) Resp:  [16-18] 18 (09/02 0730) BP: (90-110)/(50-62) 103/61 (09/02 0730) SpO2:  [98 %-99 %] 98 % (09/02 0730) Weight:  [32.7 kg-35.5 kg] 32.7 kg (09/02 0617)  Intake/Output from previous day: 09/01 0701 - 09/02 0700 In: 120 [P.O.:120] Out: 1300 [Urine:1300] Intake/Output this shift: No intake/output data recorded.  Recent Labs    01/10/19 1630 01/11/19 0251 01/12/19 0317  HGB 9.3* 8.1* 7.9*   Recent Labs    01/11/19 0251 01/12/19 0317  WBC 15.8* 13.5*  RBC 2.53* 2.52*  HCT 25.1* 24.4*  PLT 234 267   Recent Labs    01/11/19 0251 01/12/19 0317  NA 140 142  K 3.9 3.1*  CL 108 103  CO2 25 26  BUN 19 23  CREATININE 0.77 0.78  GLUCOSE 127* 92  CALCIUM 8.1* 8.5*   No results for input(s): LABPT, INR in the last 72 hours.  Neurologically intact Neurovascular intact Sensation intact distally Intact pulses distally Dorsiflexion/Plantar flexion intact Incision: dressing C/D/I No cellulitis present Compartment soft   Assessment/Plan: 2 Days Post-Op Procedure(s) (LRB): INTRAMEDULLARY (IM) NAIL LEFT INTERTROCHANTRIC (Left) Up with therapy  WBAT LLE DVT ppx- Lovenox 30 daily x 14 days D/c dispo per medicine F/u with Dr. Erlinda Hong 2 weeks post-op for staple removal      [name] 01/12/2019, 8:05 AM

## 2019-01-12 NOTE — Discharge Summary (Signed)
Physician Discharge Summary  [name] [id number] DOB: 1918-05-14 DOA: 01/07/2019  PCP: Hennie Duos, MD  Admit date: 01/07/2019 Discharge date: 01/12/2019  Admitted From: [facility] Disposition: [facility] SNF with hospice  Recommendations for Outpatient Follow-up:  1. Follow up with PCP in 1-2 weeks 2. Please obtain BMP/CBC in one week  Discharge Condition: Guarded CODE STATUS: DNR Diet recommendation: As tolerated  Brief/Interim Summary: 85 year old female with a history of diastolic CHF, tachybradycardia syndrome, failure to thrive, cognitive impairment, severe protein calorie malnutrition presenting from SNF [facility]) secondary to left leg pain.  The patient was being rolled over by SNF staff when a pop was heard.  The patient developed severe pain in her left leg, and it was noted that there was shortening and deformity of her left lower extremity. The patient was brought to emergency department for further evaluation.  She has not been providing significant history secondary to cognitive impairment.  However, she is able to answer some basic questions. Presently she denies any fevers, chills, chest pain, shortness breath, abdominal pain, headache, coughing.  She complains of left leg pain. In the emergency department, the patient was afebrile hemodynamically stable saturating 96% on room air. BMP, LFTs, EKG were unremarkable. WBC was 10.3 with hemoglobin 10.8. X-ray of the left leg showed an angulated oblique fracture of the proximal third of the left femoral diaphysis. Orthopedics was consulted, but felt that the patient needed cardiac clearance given her history of tachybradycardia syndrome.  Patient admitted as above with noted left hip fracture, status post ORIF on 831 with orthopedic surgery.  Cardiology was asked to follow as well given patient's history of tachybradycardia as well as chronic diastolic heart failure.  Patient remains markedly euvolemic, if not  somewhat hypovolemic given poor p.o. intake here.  Heart rate remains well controlled off medications.  Family updated over the phone, both daughters live out of state, given patient's advanced age, dementia we have been focusing on comfort measures which will continue in the outpatient setting at [facility] with hospice referral and palliative care on board.  Patient otherwise stable for discharge, family agreeable with current medical care and disposition.  Discharge Diagnoses:  Principal Problem:   Displaced oblique fracture of shaft of left femur, initial encounter for closed fracture [facility]) Active Problems:   Pancreatic atrophy   Chronic diastolic CHF (congestive heart failure) (HCC)   Tachy-brady syndrome (HCC)   Failure to thrive in adult   Dementia without behavioral disturbance [facility])   Discharge Instructions  Discharge Instructions    Weight bearing as tolerated   Complete by: As directed      Allergies as of 01/12/2019      Reactions   Sulfa Antibiotics Other (See Comments)   Reaction:  Unknown    Aspirin Other (See Comments)   Reaction:  Burning of pts stomach       Medication List    TAKE these medications   acetaminophen 500 MG tablet Commonly known as: TYLENOL Take 500 mg by mouth every 6 (six) hours as needed for mild pain, moderate pain, fever or headache.   ALLEVYN ADHESIVE EX Apply allevyn foam dressing topically to bilateral heels and change every 3 days and as needed for prevention   alum & mag hydroxide-simeth 200-200-20 MG/5ML suspension Commonly known as: Maalox Regular Strength Take 15 mLs by mouth every 6 (six) hours as needed for indigestion or heartburn.   docusate sodium 100 MG capsule Commonly known as: COLACE Take 100  mg by mouth daily.   enoxaparin 30 MG/0.3ML injection Commonly known as: LOVENOX Inject 0.3 mLs (30 mg total) into the skin daily.   ENSURE ENLIVE PO Take 1 Bottle by mouth 3 (three) times daily between meals.    HYDROcodone-acetaminophen 7.5-325 MG tablet Commonly known as: Norco Take 1-2 tablets by mouth 3 (three) times daily as needed for moderate pain.   magnesium hydroxide 400 MG/5ML suspension Commonly known as: MILK OF MAGNESIA Take 30 mLs by mouth as needed for mild constipation.   morphine 20 MG/ML concentrated solution Commonly known as: ROXANOL Take 5 mg by mouth 2 (two) times daily.   morphine 20 MG/ML concentrated solution Commonly known as: ROXANOL Take 5 mg by mouth every 2 (two) hours as needed for severe pain.   NON FORMULARY Diet Type:  Regular   pantoprazole 40 MG tablet Commonly known as: PROTONIX Take 40 mg by mouth every morning.   polyethylene glycol 17 g packet Commonly known as: MIRALAX / GLYCOLAX Take 17 g by mouth daily.   ProAir HFA 108 (90 Base) MCG/ACT inhaler Generic drug: albuterol Inhale 2 puffs into the lungs every 6 (six) hours as needed for wheezing or shortness of breath.   Systane 0.4-0.3 % Soln Generic drug: Polyethyl Glycol-Propyl Glycol Place 1 drop into both eyes 2 (two) times daily between meals.   Venelex Oint Apply topically to sacrum and bilateral buttocks every shift and as needed for blanchable erythema            Discharge Care Instructions  (From admission, onward)         Start     Ordered   01/10/19 0000  Weight bearing as tolerated     01/10/19 1337         Follow-up Information    Leandrew Koyanagi, MD In 2 weeks.   Specialty: Orthopedic Surgery Why: For suture removal, For wound re-check Contact information: [place] [address] [phone]          Allergies  Allergen Reactions  . Sulfa Antibiotics Other (See Comments)    Reaction:  Unknown   . Aspirin Other (See Comments)    Reaction:  Burning of pts stomach     Consultations:  Orthopedic surgery Dr. Erlinda Hong, cardiology [facility]   Procedures/Studies: Dg Chest 1 View  Result Date: 01/08/2019 CLINICAL DATA:  Leg  injury EXAM: CHEST  1 VIEW COMPARISON:  07/10/2018 FINDINGS: Severe scoliosis and thoracic cage deformity. No acute airspace disease or effusion. Stable cardiomediastinal silhouette. No pneumothorax. Old bilateral rib fractures. IMPRESSION: No active disease.  Severe scoliosis. Electronically Signed   By: Donavan Foil M.D.   On: 01/08/2019 01:21   Dg C-arm 1-60 Min  Result Date: 01/10/2019 CLINICAL DATA:  INTRAMEDULLARY (IM) NAIL LEFT INTERTROCHANTRIC Dr. Durene [facility] OR room 4 Radiation safety timeout performed by Norva Riffle 2 minutes 29 seconds fluoro time 6 Images and dose summary saved to PACS EXAM: DG C-ARM 1-60 MIN; LEFT FEMUR 2 VIEWS FLUOROSCOPY TIME:  Fluoroscopy Time:  2 minutes 29 seconds Radiation Exposure Index (if provided by the fluoroscopic device): Not provided Number of Acquired Spot Images: 6 COMPARISON:  01/08/2019 FINDINGS: Lag screws and intramedullary rod have been placed following reduction of oblique LEFT femur fracture. Cortical screws traverse the distal femur. No evidence for femoral head dislocation. IMPRESSION: ORIF LEFT femur fracture. Electronically Signed   By: Nolon Nations M.D.   On: 01/10/2019 14:58   Dg Hip Unilat W Or Wo  Pelvis 2-3 Views Left  Result Date: 01/08/2019 CLINICAL DATA:  85 year old female with left lower extremity pain. EXAM: DG HIP (WITH OR WITHOUT PELVIS) 2-3V LEFT; LEFT FEMUR 2 VIEWS COMPARISON:  Left hip radiograph dated 05/09/2016 FINDINGS: There is a displaced and angulated oblique fracture of the proximal third of the left femoral diaphysis with varus angulation of the distal fracture fragment. No definite other acute fracture identified. Old healed bilateral pubic bone fractures. There is no dislocation. There is a total right hip arthroplasty which appears intact and in anatomic alignment. There is a total left knee arthroplasty which also appears intact. The soft tissues are unremarkable. Vascular calcifications noted. IMPRESSION: Displaced  and angulated oblique fracture of the proximal third of the left femoral diaphysis. Electronically Signed   By: Anner Crete M.D.   On: 01/08/2019 01:22   Dg Femur Min 2 Views Left  Result Date: 01/10/2019 CLINICAL DATA:  INTRAMEDULLARY (IM) NAIL LEFT INTERTROCHANTRIC Dr. Durene [facility] OR room 4 Radiation safety timeout performed by Norva Riffle 2 minutes 29 seconds fluoro time 6 Images and dose summary saved to PACS EXAM: DG C-ARM 1-60 MIN; LEFT FEMUR 2 VIEWS FLUOROSCOPY TIME:  Fluoroscopy Time:  2 minutes 29 seconds Radiation Exposure Index (if provided by the fluoroscopic device): Not provided Number of Acquired Spot Images: 6 COMPARISON:  01/08/2019 FINDINGS: Lag screws and intramedullary rod have been placed following reduction of oblique LEFT femur fracture. Cortical screws traverse the distal femur. No evidence for femoral head dislocation. IMPRESSION: ORIF LEFT femur fracture. Electronically Signed   By: Nolon Nations M.D.   On: 01/10/2019 14:58   Dg Femur Min 2 Views Left  Result Date: 01/08/2019 CLINICAL DATA:  85 year old female with left lower extremity pain. EXAM: DG HIP (WITH OR WITHOUT PELVIS) 2-3V LEFT; LEFT FEMUR 2 VIEWS COMPARISON:  Left hip radiograph dated 05/09/2016 FINDINGS: There is a displaced and angulated oblique fracture of the proximal third of the left femoral diaphysis with varus angulation of the distal fracture fragment. No definite other acute fracture identified. Old healed bilateral pubic bone fractures. There is no dislocation. There is a total right hip arthroplasty which appears intact and in anatomic alignment. There is a total left knee arthroplasty which also appears intact. The soft tissues are unremarkable. Vascular calcifications noted. IMPRESSION: Displaced and angulated oblique fracture of the proximal third of the left femoral diaphysis. Electronically Signed   By: Anner Crete M.D.   On: 01/08/2019 01:22    Subjective: Patient somewhat agitated  overnight, removing IVs no acute distress this morning resting comfortably in bed.   Discharge Exam: Vitals:   01/11/19 1930 01/12/19 0337  BP: 110/62 106/62  Pulse: (!) 58 95  Resp: 16 18  Temp: 98.4 F (36.9 C) 98.4 F (36.9 C)  SpO2: 98% 98%   Vitals:   01/11/19 1930 01/12/19 0337 01/12/19 0500 01/12/19 0617  BP: 110/62 106/62    Pulse: (!) 58 95    Resp: 16 18    Temp: 98.4 F (36.9 C) 98.4 F (36.9 C)    TempSrc: Oral Oral    SpO2: 98% 98%    Weight:   34.9 kg 32.7 kg  Height:        General:  Pleasantly resting in bed, No acute distress. HEENT:  Normocephalic atraumatic.  Sclerae nonicteric, noninjected.  Extraocular movements intact bilaterally. Neck:  Without mass or deformity.  Trachea is midline. Lungs:  Clear to auscultate bilaterally without rhonchi, wheeze, or rales. Heart:  Regular  rate and rhythm.  Without murmurs, rubs, or gallops. Abdomen:  Soft, nontender, nondistended.  Without guarding or rebound. Extremities: Without cyanosis, clubbing, edema, or obvious deformity. Vascular:  Dorsalis pedis and posterior tibial pulses palpable bilaterally. Skin:  Warm and dry, no erythema, no ulcerations.   The results of significant diagnostics from this hospitalization (including imaging, microbiology, ancillary and laboratory) are listed below for reference.     Microbiology: Recent Results (from the past 240 hour(s))  SARS Coronavirus 2 [facility] order, Performed in [facility] hospital lab) Nasopharyngeal Nasopharyngeal Swab     Status: None   Collection Time: 01/08/19  3:19 AM   Specimen: Nasopharyngeal Swab  Result Value Ref Range Status   SARS Coronavirus 2 NEGATIVE NEGATIVE Final    Comment: (NOTE) If result is NEGATIVE SARS-CoV-2 target nucleic acids are NOT DETECTED. The SARS-CoV-2 RNA is generally detectable in upper and lower  respiratory specimens during the acute phase of infection. The lowest  concentration of SARS-CoV-2 viral copies this  assay can detect is 250  copies / mL. A negative result does not preclude SARS-CoV-2 infection  and should not be used as the sole basis for treatment or other  patient management decisions.  A negative result may occur with  improper specimen collection / handling, submission of specimen other  than nasopharyngeal swab, presence of viral mutation(s) within the  areas targeted by this assay, and inadequate number of viral copies  (<250 copies / mL). A negative result must be combined with clinical  observations, patient history, and epidemiological information. If result is POSITIVE SARS-CoV-2 target nucleic acids are DETECTED. The SARS-CoV-2 RNA is generally detectable in upper and lower  respiratory specimens dur ing the acute phase of infection.  Positive  results are indicative of active infection with SARS-CoV-2.  Clinical  correlation with patient history and other diagnostic information is  necessary to determine patient infection status.  Positive results do  not rule out bacterial infection or co-infection with other viruses. If result is PRESUMPTIVE POSTIVE SARS-CoV-2 nucleic acids MAY BE PRESENT.   A presumptive positive result was obtained on the submitted specimen  and confirmed on repeat testing.  While 2019 novel coronavirus  (SARS-CoV-2) nucleic acids may be present in the submitted sample  additional confirmatory testing may be necessary for epidemiological  and / or clinical management purposes  to differentiate between  SARS-CoV-2 and other Sarbecovirus currently known to infect humans.  If clinically indicated additional testing with an alternate test  methodology [phone]) is advised. The SARS-CoV-2 RNA is generally  detectable in upper and lower respiratory sp ecimens during the acute  phase of infection. The expected result is Negative. Fact Sheet for Patients:  StrictlyIdeas.no Fact Sheet for Healthcare  Providers: BankingDealers.co.za This test is not yet approved or cleared by the [place] FDA and has been authorized for detection and/or diagnosis of SARS-CoV-2 by FDA under an Emergency Use Authorization (EUA).  This EUA will remain in effect (meaning this test can be used) for the duration of the COVID-19 declaration under Section 564(b)(1) of the Act, 21 U.S.C. section 360bbb-3(b)(1), unless the authorization is terminated or revoked sooner. Performed at [facility], [address] [place], [place] [address]   MRSA PCR Screening     Status: None   Collection Time: 01/08/19 11:23 AM   Specimen: Nasal Mucosa; Nasopharyngeal  Result Value Ref Range Status   MRSA by PCR NEGATIVE NEGATIVE Final    Comment:        The  GeneXpert MRSA Assay (FDA approved for NASAL specimens only), is one component of a comprehensive MRSA colonization surveillance program. It is not intended to diagnose MRSA infection nor to guide or monitor treatment for MRSA infections. Performed at [facility], [address] [place], [place] [address]      Labs: BNP (last 3 results) No results for input(s): BNP in the last 8760 hours. Basic Metabolic Panel: Recent Labs  Lab 01/08/19 0335 01/10/19 1630 01/11/19 0251 01/12/19 0317  NA 140  --  140 142  K 4.1  --  3.9 3.1*  CL 109  --  108 103  CO2 26  --  25 26  GLUCOSE 106*  --  127* 92  BUN 22  --  19 23  CREATININE 0.59 0.80 0.77 0.78  CALCIUM 8.3*  --  8.1* 8.5*   Liver Function Tests: Recent Labs  Lab 01/08/19 0335  AST 18  ALT 13  ALKPHOS 63  BILITOT 0.5  PROT 6.1*  ALBUMIN 3.1*   No results for input(s): LIPASE, AMYLASE in the last 168 hours. No results for input(s): AMMONIA in the last 168 hours. CBC: Recent Labs  Lab 01/08/19 0335 01/10/19 1630 01/11/19 0251 01/12/19 0317  WBC 13.3* 17.7* 15.8* 13.5*  NEUTROABS 10.5*  --   --   --   HGB 10.8* 9.3* 8.1* 7.9*  HCT 35.0* 28.8* 25.1* 24.4*  MCV  100.9* 99.0 99.2 96.8  PLT 313 257 234 267   Urinalysis    Component Value Date/Time   COLORURINE YELLOW 01/08/2019 0722   APPEARANCEUR HAZY (A) 01/08/2019 0722   LABSPEC 1.012 01/08/2019 0722   PHURINE 8.0 01/08/2019 0722   GLUCOSEU NEGATIVE 01/08/2019 0722   HGBUR SMALL (A) 01/08/2019 0722   BILIRUBINUR NEGATIVE 01/08/2019 0722   KETONESUR NEGATIVE 01/08/2019 0722   PROTEINUR NEGATIVE 01/08/2019 0722   UROBILINOGEN 0.2 03/13/2015 0120   NITRITE NEGATIVE 01/08/2019 0722   LEUKOCYTESUR LARGE (A) 01/08/2019 0722   Sepsis Labs Invalid input(s): PROCALCITONIN,  WBC,  LACTICIDVEN Microbiology Recent Results (from the past 240 hour(s))  SARS Coronavirus 2 [facility] order, Performed in [facility] hospital lab) Nasopharyngeal Nasopharyngeal Swab     Status: None   Collection Time: 01/08/19  3:19 AM   Specimen: Nasopharyngeal Swab  Result Value Ref Range Status   SARS Coronavirus 2 NEGATIVE NEGATIVE Final    Comment: (NOTE) If result is NEGATIVE SARS-CoV-2 target nucleic acids are NOT DETECTED. The SARS-CoV-2 RNA is generally detectable in upper and lower  respiratory specimens during the acute phase of infection. The lowest  concentration of SARS-CoV-2 viral copies this assay can detect is 250  copies / mL. A negative result does not preclude SARS-CoV-2 infection  and should not be used as the sole basis for treatment or other  patient management decisions.  A negative result may occur with  improper specimen collection / handling, submission of specimen other  than nasopharyngeal swab, presence of viral mutation(s) within the  areas targeted by this assay, and inadequate number of viral copies  (<250 copies / mL). A negative result must be combined with clinical  observations, patient history, and epidemiological information. If result is POSITIVE SARS-CoV-2 target nucleic acids are DETECTED. The SARS-CoV-2 RNA is generally detectable in upper and lower  respiratory specimens  dur ing the acute phase of infection.  Positive  results are indicative of active infection with SARS-CoV-2.  Clinical  correlation with patient history and other diagnostic information is  necessary to determine patient  infection status.  Positive results do  not rule out bacterial infection or co-infection with other viruses. If result is PRESUMPTIVE POSTIVE SARS-CoV-2 nucleic acids MAY BE PRESENT.   A presumptive positive result was obtained on the submitted specimen  and confirmed on repeat testing.  While 2019 novel coronavirus  (SARS-CoV-2) nucleic acids may be present in the submitted sample  additional confirmatory testing may be necessary for epidemiological  and / or clinical management purposes  to differentiate between  SARS-CoV-2 and other Sarbecovirus currently known to infect humans.  If clinically indicated additional testing with an alternate test  methodology [phone]) is advised. The SARS-CoV-2 RNA is generally  detectable in upper and lower respiratory sp ecimens during the acute  phase of infection. The expected result is Negative. Fact Sheet for Patients:  StrictlyIdeas.no Fact Sheet for Healthcare Providers: BankingDealers.co.za This test is not yet approved or cleared by the [place] FDA and has been authorized for detection and/or diagnosis of SARS-CoV-2 by FDA under an Emergency Use Authorization (EUA).  This EUA will remain in effect (meaning this test can be used) for the duration of the COVID-19 declaration under Section 564(b)(1) of the Act, 21 U.S.C. section 360bbb-3(b)(1), unless the authorization is terminated or revoked sooner. Performed at [facility], [address], [place], [place] [address]   MRSA PCR Screening     Status: None   Collection Time: 01/08/19 11:23 AM   Specimen: Nasal Mucosa; Nasopharyngeal  Result Value Ref Range Status   MRSA by PCR NEGATIVE NEGATIVE Final    Comment:         The GeneXpert MRSA Assay (FDA approved for NASAL specimens only), is one component of a comprehensive MRSA colonization surveillance program. It is not intended to diagnose MRSA infection nor to guide or monitor treatment for MRSA infections. Performed at [facility], [address], [place], [place] [address]      Time coordinating discharge: Over 30 minutes  SIGNED:   Little Ishikawa, DO [facility] 01/12/2019, 7:26 AM Pager   If 7PM-7AM, please contact night-coverage [email or web address] Password [email or web address]

## 2019-01-12 NOTE — TOC Transition Note (Signed)
Transition of Care [facility]) - CM/SW Discharge Note   Patient Details  Name: [name] MRN: [id number] Date of Birth: 01-11-1919  Transition of Care [facility] CM/SW Contact:  Marilu Favre, RN Phone Number: 01/12/2019, 11:18 AM   Clinical Narrative:     Patient discharging to [facility] .   Spoke with Marianna Fuss at [facility] , patient is private pay . Last covid test 01/08/19 .   Number for nurse to call report is [phone].   Spoke to patient's daughter Peter Congo. Peter Congo aware patient discharging to [facility] via [place].   Final next level of care: [place]     Patient Goals and CMS Choice     Choice offered to / list presented to : Patient, Adult Children  Discharge Placement                       Discharge Plan and Services                DME Arranged: N/A         HH Arranged: NA          Social Determinants of Health (SDOH) Interventions     Readmission Risk Interventions No flowsheet data found.

## 2019-01-12 NOTE — Progress Notes (Signed)
RN called report to [facility] at [facility]. All questions answered. All belongings gathered to be sent with pt. Waiting on [facility] for transport.

## 2019-01-13 ENCOUNTER — Non-Acute Institutional Stay (SKILLED_NURSING_FACILITY): Payer: Medicare HMO | Admitting: Adult Health

## 2019-01-13 ENCOUNTER — Encounter: Payer: Self-pay | Admitting: Adult Health

## 2019-01-13 DIAGNOSIS — I5032 Chronic diastolic (congestive) heart failure: Secondary | ICD-10-CM

## 2019-01-13 DIAGNOSIS — K279 Peptic ulcer, site unspecified, unspecified as acute or chronic, without hemorrhage or perforation: Secondary | ICD-10-CM | POA: Diagnosis not present

## 2019-01-13 DIAGNOSIS — F411 Generalized anxiety disorder: Secondary | ICD-10-CM

## 2019-01-13 DIAGNOSIS — R69 Illness, unspecified: Secondary | ICD-10-CM | POA: Diagnosis not present

## 2019-01-13 DIAGNOSIS — R627 Adult failure to thrive: Secondary | ICD-10-CM

## 2019-01-13 DIAGNOSIS — E43 Unspecified severe protein-calorie malnutrition: Secondary | ICD-10-CM

## 2019-01-13 DIAGNOSIS — I495 Sick sinus syndrome: Secondary | ICD-10-CM | POA: Diagnosis not present

## 2019-01-13 DIAGNOSIS — D539 Nutritional anemia, unspecified: Secondary | ICD-10-CM

## 2019-01-13 DIAGNOSIS — S72332S Displaced oblique fracture of shaft of left femur, sequela: Secondary | ICD-10-CM

## 2019-01-13 DIAGNOSIS — G894 Chronic pain syndrome: Secondary | ICD-10-CM

## 2019-01-13 DIAGNOSIS — K5909 Other constipation: Secondary | ICD-10-CM

## 2019-01-13 DIAGNOSIS — F0390 Unspecified dementia without behavioral disturbance: Secondary | ICD-10-CM

## 2019-01-13 NOTE — Progress Notes (Signed)
Location:    [facility] Room Number: 156/W Place of Service:  SNF (31)   CODE STATUS: DNR  Allergies  Allergen Reactions  . Sulfa Antibiotics Other (See Comments)    Reaction:  Unknown   . Aspirin Other (See Comments)    Reaction:  Burning of pts stomach     Chief Complaint  Patient presents with  . Hospitalization Follow-up    HPI:   She is a 85 year old long term resident of this facility who was being turned over and heard a pop on her left leg. She was taken to the and was found out to have a displaced and angulated oblique fracture of left femur. She had a nailing on 01-10-19. She denies any pain. There are no reports of changes in her appetite no reports of anxiety or agitation. She will continue to be followed for her chronic illnesses including: chronic pain; chf; pud. The focus of her care is comfort.   Past Medical History:  Diagnosis Date  . Anemia   . Arthritis   . Cancer ([place])    uterine surg only  . CHF (congestive heart failure) ([place])   . Colitis 12/31/2012   presumed C.Diff. patient declined colonoscopy  . Hypotension   . Kidney stone   . Muscle weakness (generalized)   . Osteoarthritis   . Pancreatic atrophy 12/31/2012  . Pneumonia     Past Surgical History:  Procedure Laterality Date  . ABDOMINAL HYSTERECTOMY    . CHOLECYSTECTOMY    . ESOPHAGOGASTRODUODENOSCOPY N/A 03/03/2013   RMR: Significant gastric ulcer without bleeding stigmata requiring no endoscopic intervention today. Noncritical Schatzki's ring. Small hiatal hernia  . ESOPHAGOGASTRODUODENOSCOPY N/A 08/24/2013   Dr. [name]: Hiatal hernia.  Gastric mucosal scar formation at site of previously noted gastric ulcerations  . INTRAMEDULLARY (IM) NAIL INTERTROCHANTERIC Left 01/10/2019   Procedure: INTRAMEDULLARY (IM) NAIL LEFT INTERTROCHANTRIC;  Surgeon: Leandrew Koyanagi, MD;  Location: [facility];  Service: Orthopedics;  Laterality: Left;  . JOINT REPLACEMENT    . LEFT KNEE SURGERY  AND REVISION    . RIGHT HIP SURGERY      Social History   Socioeconomic History  . Marital status: Widowed    Spouse name: Not on file  . Number of children: Not on file  . Years of education: Not on file  . Highest education level: Not on file  Occupational History  . Not on file  Social Needs  . Financial resource strain: Not on file  . Food insecurity    Worry: Not on file    Inability: Not on file  . Transportation needs    Medical: Not on file    Non-medical: Not on file  Tobacco Use  . Smoking status: Former Smoker    Types: Cigarettes    Quit date: 04/25/1951    Years since quitting: 67.7  . Smokeless tobacco: Never Used  Substance and Sexual Activity  . Alcohol use: No    Alcohol/week: 1.0 standard drinks    Types: 1 Glasses of wine per week  . Drug use: No  . Sexual activity: Never  Lifestyle  . Physical activity    Days per week: Not on file    Minutes per session: Not on file  . Stress: Not on file  Relationships  . Social Herbalist on phone: Not on file    Gets together: Not on file    Attends religious service: Not on file  Active member of club or organization: Not on file    Attends meetings of clubs or organizations: Not on file    Relationship status: Not on file  . Intimate partner violence    Fear of current or ex partner: Not on file    Emotionally abused: Not on file    Physically abused: Not on file    Forced sexual activity: Not on file  Other Topics Concern  . Not on file  Social History Narrative  . Not on file   Family History  Problem Relation Age of Onset  . Cancer Mother       VITAL SIGNS BP 106/76   Pulse 64   Temp (!) 97.2 F (36.2 C) (Oral)   Resp 18   Ht 4\' 5"  (1.346 m)   Wt 71 lb 3.2 oz (32.3 kg)   SpO2 93%   BMI 17.82 kg/m   Outpatient Encounter Medications as of 01/13/2019  Medication Sig  . acetaminophen (TYLENOL) 500 MG tablet Take 500 mg by mouth every 6 (six) hours as needed for mild pain,  moderate pain, fever or headache.   . albuterol (PROAIR HFA) 108 (90 BASE) MCG/ACT inhaler Inhale 2 puffs into the lungs every 6 (six) hours as needed for wheezing or shortness of breath.   Marland Kitchen alum & mag hydroxide-simeth (MAALOX REGULAR STRENGTH) 200-200-20 MG/5ML suspension Take 15 mLs by mouth every 6 (six) hours as needed for indigestion or heartburn.  Roseanne Kaufman Peru-Castor Oil (VENELEX) OINT Apply topically to sacrum and bilateral buttocks every shift and as needed for blanchable erythema  . docusate sodium (COLACE) 100 MG capsule Take 100 mg by mouth daily.   Marland Kitchen enoxaparin (LOVENOX) 30 MG/0.3ML injection Inject 0.3 mLs (30 mg total) into the skin daily.  . magnesium hydroxide (MILK OF MAGNESIA) 400 MG/5ML suspension Take 30 mLs by mouth as needed for mild constipation.   Marland Kitchen morphine (ROXANOL) 20 MG/ML concentrated solution Take 5 mg by mouth 2 (two) times daily.  Marland Kitchen morphine (ROXANOL) 20 MG/ML concentrated solution Take 5 mg by mouth every 2 (two) hours as needed for severe pain.  . NON FORMULARY Diet Type:  Regular  . Nutritional Supplements (ENSURE ENLIVE PO) Take 1 Bottle by mouth 3 (three) times daily between meals.  . pantoprazole (PROTONIX) 40 MG tablet Take 40 mg by mouth every morning.   Vladimir Faster Glycol-Propyl Glycol (SYSTANE) 0.4-0.3 % SOLN Place 1 drop into both eyes 2 (two) times daily between meals.   . polyethylene glycol (MIRALAX / GLYCOLAX) packet Take 17 g by mouth daily.   . Wound Dressings (ALLEVYN ADHESIVE EX) Apply allevyn foam dressing topically to bilateral heels and change every 3 days and as needed for prevention  . [DISCONTINUED] HYDROcodone-acetaminophen (NORCO) 7.5-325 MG tablet Take 1-2 tablets by mouth 3 (three) times daily as needed for moderate pain.   No facility-administered encounter medications on file as of 01/13/2019.      SIGNIFICANT DIAGNOSTIC EXAMS   PREVIOUS:   07-10-18: chest x-ray: No acute abnormality noted.  07-10-18: ct of head: Chronic  atrophic and ischemic changes without acute abnormality.  07-11-18: 2-d echo:  1. The left ventricle has normal systolic function with an ejection fraction of 60-65%. The cavity size was normal. Left ventricular diastolic Doppler parameters are indeterminate.  2. The right ventricle has normal systolic function. The cavity was normal. There is no increase in right ventricular wall thickness.  3. Left atrial size was moderately dilated.  4. The mitral valve  is normal in structure. Mild thickening of the mitral valve leaflet. There is mild mitral annular calcification present.  5. The tricuspid valve is normal in structure.  6. The aortic valve is tricuspid Mild thickening of the aortic valve.  7. The pulmonic valve was normal in structure.  8. Normal LV function; moderate LAE; mild MR and TR.  07-12-18: ct of head: 1. Atrophy and white matter microvascular disease not changed from prior. 2. No acute findings.  TODAY;   01-08-19: left hip fracture: Displaced and angulated oblique fracture of the proximal third of the left femoral diaphysis.  01-08-19: chest x-ray: No active disease. Severe scoliosis   01-08-19: left femur x-ray: Displaced and angulated oblique fracture of the proximal third of the left femoral diaphysis.   LABS REVIEWED PREVIOUS:   07-10-18: wbc 6.3; hgb 12.3; hct 38.6; mcv 105.2; plt 219; glucose 108; bun 17 creat 0.67; k+ 3.8; na++ 142; ca 9.0 liver normal albumin 4.4 urine culture: no growth urine drug screen neg 07-11-18: chol 178; ldl 98  trig 65 hdl 67; tsh 0.831; hgb a1c 5.5 bit B 12 204  07-13-18: glucose 65; bun 21; creat 0.76; k+ 3.2; an++ 139; ca 8.8 mag 1.8; phos 3.1 07-14-18: glucose 120; bun 11; creat 0.63; k+ 3.5; na++ 139  07-19-18: wbc 10.5; hgb 10.8; hct 32.9; mcv 99.4 plt 186; glucose bun 19; creat 0.53;  85; k+ 2.6; na++ 139 ca 8.4 07-26-18: wbc 14.2; hgb 12.3; hct 39.3; mcv 105.1; plt 405; glucose 96; bun 54; creat 0.84; k+ 4.0; na++ 148; ca 9.2   TODAY;    01-08-19: wbc 13.3; hgb 10.8; hct 35.0; mcv 100.9; plt 313; glucose 106; bun 22; creat 0.59; k+ 4.1; na++ 140; ca 8.3; liver normal albumin 3.1 01-12-19: wbc 13.5; hgb 7.9; hct 24.4; mcv 96.8; plt 267; glucose 92; bun 23; creat 0.78; k+ 3.1; na++ 142; ca 8.5     Review of Systems  Unable to perform ROS: Dementia (unable to participate )    Physical Exam Constitutional:      General: She is not in acute distress.    Appearance: She is cachectic. She is not diaphoretic.  Neck:     Musculoskeletal: Neck supple.     Thyroid: No thyromegaly.  Cardiovascular:     Rate and Rhythm: Normal rate and regular rhythm.     Pulses: Normal pulses.     Heart sounds: Normal heart sounds.  Pulmonary:     Effort: Pulmonary effort is normal. No respiratory distress.     Breath sounds: Normal breath sounds.  Abdominal:     General: Bowel sounds are normal. There is no distension.     Palpations: Abdomen is soft.     Tenderness: There is no abdominal tenderness.  Musculoskeletal:     Right lower leg: No edema.     Left lower leg: Edema present.     Comments: Is able to move all extremities Is status post left femur fracture   Lymphadenopathy:     Cervical: No cervical adenopathy.  Skin:    General: Skin is warm and dry.     Comments: Incision line without signs of infection present   Neurological:     Mental Status: She is alert. Mental status is at baseline.  Psychiatric:        Mood and Affect: Mood normal.      ASSESSMENT/ PLAN:  TODAY:   1. Tachy/brady syndrome: is without change in status: is not a candidate for a  pace maker will monitor  2. Chronic diastolic congestive heart failure: is presently compensated will monitor   3. PUD (peptic ulcer disease) is stable will continue protonix 40 mg daily   4. Chronic constipation: is stable will continue colace daily and miralax daily   5. Dementia without behavioral disturbance; no significant change in status: weight is 72 (previous  68,69,71) pounds; will continue to monitor her status; unfortunately weight loss is an expected outcome in the late stages of this disease process  6. Displaced oblique fracture of shaft of left femur sequela: is stable will follow up with orthopedics as indicated; the focus of her care is comfort. Will continue lovenox 30 mg daily for 30 days will continue roxanol 5 mg every 2 hours as needed  7. Protein calorie malnutrition severe/ failure to thrive in adult: no significant change weight is 72 pounds; will continue ensure three times daily   8. Macrocytic anemia: is stable hgb 7.9 will monitor   9. Generalized anxiety disorder: is without change: is off xanax  10. Chronic pain syndrome; is stable will continue roxanol 5 mg twice daily and every 2 hours as needed.    On 01-18-19: will check cbc; bmp      MD is aware of resident's narcotic use and is in agreement with current plan of care. We will attempt to wean resident as appropriate.  Ok Edwards NP [facility]  Contact [phone] Monday through Friday 8am- 5pm  After hours call [phone]

## 2019-01-18 ENCOUNTER — Other Ambulatory Visit (HOSPITAL_COMMUNITY)
Admission: RE | Admit: 2019-01-18 | Discharge: 2019-01-18 | Disposition: A | Discharge status: Home / Self Care | Payer: Medicare HMO | Source: Skilled Nursing Facility | Attending: Adult Health | Admitting: Adult Health

## 2019-01-18 DIAGNOSIS — I5032 Chronic diastolic (congestive) heart failure: Secondary | ICD-10-CM | POA: Diagnosis not present

## 2019-01-18 LAB — CBC
HCT: 27.2 % — ABNORMAL LOW (ref 36.0–46.0)
Hemoglobin: 8.5 g/dL — ABNORMAL LOW (ref 12.0–15.0)
MCH: 32.3 pg (ref 26.0–34.0)
MCHC: 31.3 g/dL (ref 30.0–36.0)
MCV: 103.4 fL — ABNORMAL HIGH (ref 80.0–100.0)
Platelets: 404 10*3/uL — ABNORMAL HIGH (ref 150–400)
RBC: 2.63 MIL/uL — ABNORMAL LOW (ref 3.87–5.11)
RDW: 17.5 % — ABNORMAL HIGH (ref 11.5–15.5)
WBC: 8.4 10*3/uL (ref 4.0–10.5)
nRBC: 0 % (ref 0.0–0.2)

## 2019-01-18 LAB — BASIC METABOLIC PANEL
Anion gap: 7 (ref 5–15)
BUN: 22 mg/dL (ref 8–23)
CO2: 28 mmol/L (ref 22–32)
Calcium: 8.2 mg/dL — ABNORMAL LOW (ref 8.9–10.3)
Chloride: 109 mmol/L (ref 98–111)
Creatinine, Ser: 0.52 mg/dL (ref 0.44–1.00)
GFR calc Af Amer: >60 mL/min (ref >60)
GFR calc non Af Amer: >60 mL/min (ref >60)
Glucose, Bld: 80 mg/dL (ref 70–99)
Potassium: 3.6 mmol/L (ref 3.5–5.1)
Sodium: 144 mmol/L (ref 135–145)

## 2019-01-18 LAB — SARS CORONAVIRUS 2 (TAT 6-24 HRS): SARS Coronavirus 2: NEGATIVE

## 2019-01-19 ENCOUNTER — Telehealth: Payer: Self-pay | Admitting: Orthopaedic Surgery

## 2019-01-19 ENCOUNTER — Encounter: Payer: Self-pay | Admitting: Internal Medicine

## 2019-01-19 ENCOUNTER — Non-Acute Institutional Stay (SKILLED_NURSING_FACILITY): Payer: Medicare HMO | Admitting: Internal Medicine

## 2019-01-19 DIAGNOSIS — F0390 Unspecified dementia without behavioral disturbance: Secondary | ICD-10-CM

## 2019-01-19 DIAGNOSIS — R627 Adult failure to thrive: Secondary | ICD-10-CM

## 2019-01-19 DIAGNOSIS — E43 Unspecified severe protein-calorie malnutrition: Secondary | ICD-10-CM

## 2019-01-19 DIAGNOSIS — K279 Peptic ulcer, site unspecified, unspecified as acute or chronic, without hemorrhage or perforation: Secondary | ICD-10-CM | POA: Diagnosis not present

## 2019-01-19 DIAGNOSIS — S72332S Displaced oblique fracture of shaft of left femur, sequela: Secondary | ICD-10-CM

## 2019-01-19 DIAGNOSIS — R69 Illness, unspecified: Secondary | ICD-10-CM | POA: Diagnosis not present

## 2019-01-19 NOTE — Telephone Encounter (Signed)
Yes at 2 weeks postop from 01/10/19

## 2019-01-19 NOTE — Progress Notes (Signed)
: Provider:  Hennie Duos., MD Location:  [facility] Room Number: 119-P Place of Service:  SNF ([phone])  PCP: Hennie Duos, MD Patient Care Team: Hennie Duos, MD as PCP - General (Internal Medicine) Dorothy Spark, MD as PCP - Cardiology (Cardiology) Constance Haw, MD as PCP - Electrophysiology (Cardiology) [name] Cristopher Estimable, MD as Consulting Physician (Gastroenterology) [facility] ([place]) Nyoka Cowden Phylis Bougie, NP as Nurse Practitioner (Geriatric Medicine)  Extended Emergency Contact Information Primary Emergency Contact: Schoenrock,Gloria [place] Phone: [phone] Relation: Daughter Secondary Emergency Contact: Hutchinson,Marilynn  [place] Phone: [phone] Relation: Daughter     Allergies: Sulfa antibiotics and Aspirin  Chief Complaint  Patient presents with  . Readmit To SNF    Readmission to [facility]    HPI: Patient is a 85 y.o. female with diastolic congestive heart failure, tachybradycardia syndrome, failure to thrive, cognitive impairment, severe protein malnutrition, presenting to anything emergency department from skilled nursing facility with left leg pain.  Patient was being rolled over by sniffs that when a pop was heard and patient developed severe pain in her left leg and there was noted to be some shortening and deformity of the left lower extremity.  Patient not able to provide history but she is able to answer some basic questions.  Patient was without fevers chills chest pain shortness of breath abdominal pain headache coughing.  She complained of left leg pain.  In the emergency department patient was afebrile, hemodynamically stable, satting 90% on room air, BMP LFTs and EKG were all unremarkable and hemoglobin 10.8.  X-ray of the left leg showed an angulated oblique fracture of the proximal third of the left femoral diaphysis.   Patient was admitted to [facility] from 8/28-9/2 where she required some preop work-up secondary to her tachybradycardia syndrome.  Patient was found to be hemodynamically stable and underwent ORIF on 8/31.  There were no apparent complications.  Patient is admitted back to skilled nursing facility for OT/PT and for residential care.  While at skilled nursing facility patient will be followed for GERD treated with Protonix and protein malnutrition treated with supplements.  Past Medical History:  Diagnosis Date  . Anemia   . Arthritis   . Cancer ([place])    uterine surg only  . CHF (congestive heart failure) ([place])   . Colitis 12/31/2012   presumed C.Diff. patient declined colonoscopy  . Hypotension   . Kidney stone   . Muscle weakness (generalized)   . Osteoarthritis   . Pancreatic atrophy 12/31/2012  . Pneumonia     Past Surgical History:  Procedure Laterality Date  . ABDOMINAL HYSTERECTOMY    . CHOLECYSTECTOMY    . ESOPHAGOGASTRODUODENOSCOPY N/A 03/03/2013   RMR: Significant gastric ulcer without bleeding stigmata requiring no endoscopic intervention today. Noncritical Schatzki's ring. Small hiatal hernia  . ESOPHAGOGASTRODUODENOSCOPY N/A 08/24/2013   Dr. [name]: Hiatal hernia.  Gastric mucosal scar formation at site of previously noted gastric ulcerations  . INTRAMEDULLARY (IM) NAIL INTERTROCHANTERIC Left 01/10/2019   Procedure: INTRAMEDULLARY (IM) NAIL LEFT INTERTROCHANTRIC;  Surgeon: Leandrew Koyanagi, MD;  Location: [facility];  Service: Orthopedics;  Laterality: Left;  . JOINT REPLACEMENT    . LEFT KNEE SURGERY AND REVISION    . RIGHT HIP SURGERY      Allergies as of 01/19/2019      Reactions   Sulfa Antibiotics Other (See Comments)   Reaction:  Unknown    Aspirin Other (See Comments)   Reaction:  Burning of pts stomach       Medication List    Notice   This visit is during an admission. Changes to the med list made in this visit will be reflected in the After Visit Summary  of the admission.    Current Outpatient Medications on File Prior to Visit  Medication Sig Dispense Refill  . acetaminophen (TYLENOL) 500 MG tablet Take 500 mg by mouth every 6 (six) hours as needed for mild pain, moderate pain, fever or headache.     . albuterol (PROAIR HFA) 108 (90 BASE) MCG/ACT inhaler Inhale 2 puffs into the lungs every 6 (six) hours as needed for wheezing or shortness of breath.     Marland Kitchen alum & mag hydroxide-simeth (MAALOX REGULAR STRENGTH) 200-200-20 MG/5ML suspension Take 15 mLs by mouth every 6 (six) hours as needed for indigestion or heartburn. 355 mL 0  . Balsam Peru-Castor Oil (VENELEX) OINT Apply topically to sacrum and bilateral buttocks every shift and as needed for blanchable erythema    . docusate sodium (COLACE) 100 MG capsule Take 100 mg by mouth daily.     Marland Kitchen enoxaparin (LOVENOX) 30 MG/0.3ML injection Inject 0.3 mLs (30 mg total) into the skin daily. 0.3 mL 13  . magnesium hydroxide (MILK OF MAGNESIA) 400 MG/5ML suspension Take 30 mLs by mouth as needed for mild constipation.     Marland Kitchen morphine (ROXANOL) 20 MG/ML concentrated solution Take 5 mg by mouth every 2 (two) hours as needed for severe pain.    . NON FORMULARY Diet Type:  Regular    . Nutritional Supplements (ENSURE ENLIVE PO) Take 1 Bottle by mouth 3 (three) times daily between meals.    . pantoprazole (PROTONIX) 40 MG tablet Take 40 mg by mouth every morning.     Vladimir Faster Glycol-Propyl Glycol (SYSTANE) 0.4-0.3 % SOLN Place 1 drop into both eyes 2 (two) times daily between meals.     . polyethylene glycol (MIRALAX / GLYCOLAX) packet Take 17 g by mouth daily.     . Wound Dressings (ALLEVYN ADHESIVE EX) Apply allevyn foam dressing topically to bilateral heels and change every 3 days and as needed for prevention     No current facility-administered medications on file prior to visit.      No orders of the defined types were placed in this encounter.   Immunization History  Administered Date(s)  Administered  . PPD Test 07/15/2018, 07/28/2018    Social History   Tobacco Use  . Smoking status: Former Smoker    Types: Cigarettes    Quit date: 04/25/1951    Years since quitting: 67.7  . Smokeless tobacco: Never Used  Substance Use Topics  . Alcohol use: No    Alcohol/week: 1.0 standard drinks    Types: 1 Glasses of wine per week    Family history is   Family History  Problem Relation Age of Onset  . Cancer Mother       Review of Systems  DATA OBTAINED: from patient-limited; nursing-no acute concerns GENERAL:  no fevers, fatigue, appetite changes SKIN: No itching, or rash EYES: No eye pain, redness, discharge EARS: No earache, tinnitus, change in hearing NOSE: No congestion, drainage or bleeding  MOUTH/THROAT: No mouth or tooth pain, No sore throat RESPIRATORY: No cough, wheezing, SOB CARDIAC: No chest pain, palpitations, lower extremity edema  GI: No abdominal pain, No N/V/D or constipation, No heartburn or reflux  GU: No  dysuria, frequency or urgency, or incontinence  MUSCULOSKELETAL: No unrelieved bone/joint pain NEUROLOGIC: No headache, dizziness or focal weakness PSYCHIATRIC: No c/o anxiety or sadness   Vitals:   01/19/19 1445  BP: 108/68  Pulse: 72  Resp: 18  Temp: 98.8 F (37.1 C)  SpO2: 93%    SpO2 Readings from Last 1 Encounters:  01/19/19 93%   Body mass index is 17.82 kg/m.     Physical Exam  GENERAL APPEARANCE: Alert,   No acute distress.  SKIN: No diaphoresis rash HEAD: Normocephalic, atraumatic  EYES: Conjunctiva/lids clear. Pupils round, reactive. EOMs intact.  EARS: External exam WNL, canals clear. Hearing grossly normal.  NOSE: No deformity or discharge.  MOUTH patent/THROAT: Lips w/o lesions  RESPIRATORY: Breathing is even, unlabored. Lung sounds are clear   CARDIOVASCULAR: Heart RRR no murmurs, rubs or gallops. No peripheral edema.   GASTROINTESTINAL: Abdomen is soft, non-tender, not distended w/ normal bowel sounds.  GENITOURINARY: Bladder non tender, not distended  MUSCULOSKELETAL: No abnormal joints or musculature; does not appear in pain NEUROLOGIC:  Cranial nerves 2-12 grossly intact. Moves all extremities  PSYCHIATRIC: Mood and affect with dementia, no behavioral issues  Patient Active Problem List   Diagnosis Date Noted  . Displaced oblique fracture of shaft of left femur, sequela 01/08/2019  . Dementia without behavioral disturbance ([place]) 07/31/2018  . Generalized pain 07/31/2018  . Failure to thrive in adult 07/19/2018  . Generalized anxiety disorder 07/18/2018  . Tachy-brady syndrome ([place]) 07/18/2018  . Confusion 07/10/2018  . Chronic pain 07/10/2018  . Closed fracture of distal end of right humerus with routine healing 01/04/2018  . Multiple rib fractures 05/09/2016  . Falls 05/09/2016  . Leukocytosis 05/09/2016  . Chest pain 03/13/2015  . Other specified fever   . Pain in the chest   . Abdominal pain 10/28/2014  . Anemia 10/28/2014  . Hypokalemia 10/28/2014  . Protein-calorie malnutrition, severe ([place]) 10/28/2014  . Macrocytic anemia 08/13/2014  . Chronic diastolic CHF (congestive heart failure) ([place]) 08/13/2014  . PUD (peptic ulcer disease) 10/05/2013  . Acute gastric ulcer due to Helicobacter pylori [id number]  . Chronic constipation 02/23/2013  . Right kidney stone 12/31/2012  . Hypotension 12/31/2012  . Pancreatic atrophy 12/31/2012      Labs reviewed: Basic Metabolic Panel:    Component Value Date/Time   NA 144 01/18/2019 0905   K 3.6 01/18/2019 0905   CL 109 01/18/2019 0905   CO2 28 01/18/2019 0905   GLUCOSE 80 01/18/2019 0905   BUN 22 01/18/2019 0905   CREATININE 0.52 01/18/2019 0905   CALCIUM 8.2 (L) 01/18/2019 0905   PROT 6.1 (L) 01/08/2019 0335   PROT 6.1 07/29/2013   ALBUMIN 3.1 (L) 01/08/2019 0335   ALBUMIN 4.0 07/29/2013   AST 18 01/08/2019 0335   AST 18 07/29/2013   ALT 13 01/08/2019 0335   ALKPHOS 63 01/08/2019 0335   ALKPHOS 73.0 07/29/2013    BILITOT 0.5 01/08/2019 0335   BILITOT 0.5 07/29/2013   GFRNONAA >60 01/18/2019 0905   GFRAA >60 01/18/2019 0905    Recent Labs    07/13/18 1301  01/11/19 0251 01/12/19 0317 01/18/19 0905  NA 139   < > 140 142 144  K 3.2*   < > 3.9 3.1* 3.6  CL 107   < > 108 103 109  CO2 17*   < > 25 26 28   GLUCOSE 65*   < > 127* 92 80  BUN 21   < > 19 23 22  CREATININE 0.76   < > 0.77 0.78 0.52  CALCIUM 8.8*   < > 8.1* 8.5* 8.2*  MG 1.8  --   --   --   --   PHOS 3.1  --   --   --   --    < > = values in this interval not displayed.   Liver Function Tests: Recent Labs    02/25/18 1852 07/10/18 1422 01/08/19 0335  AST 22 25 18   ALT 13 14 13   ALKPHOS 52 57 63  BILITOT 1.1 0.8 0.5  PROT 7.0 7.0 6.1*  ALBUMIN 4.6 4.4 3.1*   No results for input(s): LIPASE, AMYLASE in the last 8760 hours. No results for input(s): AMMONIA in the last 8760 hours. CBC: Recent Labs    02/25/18 1852  07/10/18 1422  01/08/19 0335  01/11/19 0251 01/12/19 0317 01/18/19 0905  WBC 10.9*   < > 6.3   < > 13.3*   < > 15.8* 13.5* 8.4  NEUTROABS 9.3*  --  4.1  --  10.5*  --   --   --   --   HGB 12.5   < > 12.3   < > 10.8*   < > 8.1* 7.9* 8.5*  HCT 38.5   < > 38.6   < > 35.0*   < > 25.1* 24.4* 27.2*  MCV 102.1*   < > 105.2*   < > 100.9*   < > 99.2 96.8 103.4*  PLT 247   < > 219   < > 313   < > 234 267 404*   < > = values in this interval not displayed.   Lipid Recent Labs    07/11/18 0429  CHOL 178  HDL 67  LDLCALC 98  TRIG 65    Cardiac Enzymes: No results for input(s): CKTOTAL, CKMB, CKMBINDEX, TROPONINI in the last 8760 hours. BNP: No results for input(s): BNP in the last 8760 hours. No results found for: [facility] Lab Results  Component Value Date   HGBA1C 5.5 07/11/2018   Lab Results  Component Value Date   TSH 0.831 07/11/2018   Lab Results  Component Value Date   [id number] 07/11/2018   No results found for: FOLATE No results found for: IRON, TIBC, FERRITIN  Imaging and  Procedures obtained prior to SNF admission: No results found.   Not all labs, radiology exams or other studies done during hospitalization come through on my EPIC note; however they are reviewed by me.    Assessment and Plan;   Displaced oblique fracture of the shaft of the left femur- status post ORIF on 8/31 SNF- admitted back for residential care.;  Prophylaxis with Lovenox 30 mg daily for 28 days presumed since not stated  Dementia/failure to thrive SNF- no dementia specific medications; continue supportive care  Severe protein malnutrition SNF- continue protein supplementation  PUD SNF-stable; continue Protonix 40 mg daily   Time spent greater than 35 minutes;> 50% of time with patient was spent reviewing records, labs, tests and studies, counseling and developing plan of care  Hennie Duos, MD

## 2019-01-19 NOTE — Telephone Encounter (Signed)
Mavis nurse with [facility] called asked if it is ok to remove the staples there instead of making an office visit appointment for the patient? The number to contact Fredderick Severance is [phone]

## 2019-01-19 NOTE — Telephone Encounter (Signed)
Called and sw [name] to advise of order below. Voiced understanding and will call with any questions.

## 2019-01-19 NOTE — Telephone Encounter (Signed)
Pt is s/p a left IM nail femur 01/10/19 left IM nail femur. See message below.

## 2019-01-20 ENCOUNTER — Encounter: Payer: Self-pay | Admitting: Internal Medicine

## 2019-01-20 ENCOUNTER — Encounter: Payer: Self-pay | Admitting: Adult Health

## 2019-01-20 ENCOUNTER — Non-Acute Institutional Stay (SKILLED_NURSING_FACILITY): Payer: Medicare HMO | Admitting: Adult Health

## 2019-01-20 ENCOUNTER — Other Ambulatory Visit: Payer: Self-pay | Admitting: Adult Health

## 2019-01-20 DIAGNOSIS — S72332S Displaced oblique fracture of shaft of left femur, sequela: Secondary | ICD-10-CM | POA: Diagnosis not present

## 2019-01-20 DIAGNOSIS — G894 Chronic pain syndrome: Secondary | ICD-10-CM

## 2019-01-20 DIAGNOSIS — R69 Illness, unspecified: Secondary | ICD-10-CM | POA: Diagnosis not present

## 2019-01-20 DIAGNOSIS — F0390 Unspecified dementia without behavioral disturbance: Secondary | ICD-10-CM | POA: Diagnosis not present

## 2019-01-20 MED ORDER — MORPHINE SULFATE (CONCENTRATE) 20 MG/ML PO SOLN: 5.0000 mg | Freq: Two times a day (BID) | ORAL | 0 refills | Status: DC

## 2019-01-20 NOTE — Progress Notes (Signed)
Location:    [facility] Room Number: 119/P Place of Service:  SNF (31)   CODE STATUS: DNR  Allergies  Allergen Reactions  . Sulfa Antibiotics Other (See Comments)    Reaction:  Unknown   . Aspirin Other (See Comments)    Reaction:  Burning of pts stomach     Chief Complaint  Patient presents with  . Medical Management of Chronic Issues        Chronic pain syndrome; Displaced oblique fracture of shaft of left femur sequela: Dementia without behavioral disturbance  Weekly follow up for the first 30 days post hospitalization.    HPI:  She is a 85 year old long term resident of this facility is being seen for the management of her chronic illnesses; pain; femur fracture; dementia. Her pain is being managed. There are no reports of agitation; no reports of constipation.   Past Medical History:  Diagnosis Date  . Anemia   . Arthritis   . Cancer ([place])    uterine surg only  . CHF (congestive heart failure) ([place])   . Colitis 12/31/2012   presumed C.Diff. patient declined colonoscopy  . Hypotension   . Kidney stone   . Muscle weakness (generalized)   . Osteoarthritis   . Pancreatic atrophy 12/31/2012  . Pneumonia     Past Surgical History:  Procedure Laterality Date  . ABDOMINAL HYSTERECTOMY    . CHOLECYSTECTOMY    . ESOPHAGOGASTRODUODENOSCOPY N/A 03/03/2013   RMR: Significant gastric ulcer without bleeding stigmata requiring no endoscopic intervention today. Noncritical Schatzki's ring. Small hiatal hernia  . ESOPHAGOGASTRODUODENOSCOPY N/A 08/24/2013   Dr. [name]: Hiatal hernia.  Gastric mucosal scar formation at site of previously noted gastric ulcerations  . INTRAMEDULLARY (IM) NAIL INTERTROCHANTERIC Left 01/10/2019   Procedure: INTRAMEDULLARY (IM) NAIL LEFT INTERTROCHANTRIC;  Surgeon: Leandrew Koyanagi, MD;  Location: [facility];  Service: Orthopedics;  Laterality: Left;  . JOINT REPLACEMENT    . LEFT KNEE SURGERY AND REVISION    . RIGHT HIP SURGERY       Social History   Socioeconomic History  . Marital status: Widowed    Spouse name: Not on file  . Number of children: Not on file  . Years of education: Not on file  . Highest education level: Not on file  Occupational History  . Not on file  Social Needs  . Financial resource strain: Not on file  . Food insecurity    Worry: Not on file    Inability: Not on file  . Transportation needs    Medical: Not on file    Non-medical: Not on file  Tobacco Use  . Smoking status: Former Smoker    Types: Cigarettes    Quit date: 04/25/1951    Years since quitting: 67.7  . Smokeless tobacco: Never Used  Substance and Sexual Activity  . Alcohol use: No    Alcohol/week: 1.0 standard drinks    Types: 1 Glasses of wine per week  . Drug use: No  . Sexual activity: Never  Lifestyle  . Physical activity    Days per week: Not on file    Minutes per session: Not on file  . Stress: Not on file  Relationships  . Social Herbalist on phone: Not on file    Gets together: Not on file    Attends religious service: Not on file    Active member of club or organization: Not on file    Attends  meetings of clubs or organizations: Not on file    Relationship status: Not on file  . Intimate partner violence    Fear of current or ex partner: Not on file    Emotionally abused: Not on file    Physically abused: Not on file    Forced sexual activity: Not on file  Other Topics Concern  . Not on file  Social History Narrative   Former cigarette smoker.  Widowed.   Family History  Problem Relation Age of Onset  . Cancer Mother       VITAL SIGNS BP 108/68   Pulse 72   Temp 98.8 F (37.1 C) (Oral)   Resp 18   Ht 4\' 5"  (1.346 m)   Wt 71 lb 3.2 oz (32.3 kg)   SpO2 93%   BMI 17.82 kg/m   Outpatient Encounter Medications as of 01/20/2019  Medication Sig  . acetaminophen (TYLENOL) 500 MG tablet Take 500 mg by mouth every 6 (six) hours as needed for mild pain, moderate pain, fever or  headache.   . albuterol (PROAIR HFA) 108 (90 BASE) MCG/ACT inhaler Inhale 2 puffs into the lungs every 6 (six) hours as needed for wheezing or shortness of breath.   Marland Kitchen alum & mag hydroxide-simeth (MAALOX REGULAR STRENGTH) 200-200-20 MG/5ML suspension Take 15 mLs by mouth every 6 (six) hours as needed for indigestion or heartburn.  Roseanne Kaufman Peru-Castor Oil (VENELEX) OINT Apply topically to sacrum and bilateral buttocks every shift and as needed for blanchable erythema  . docusate sodium (COLACE) 100 MG capsule Take 100 mg by mouth daily.   Marland Kitchen enoxaparin (LOVENOX) 30 MG/0.3ML injection Inject 0.3 mLs (30 mg total) into the skin daily.  . magnesium hydroxide (MILK OF MAGNESIA) 400 MG/5ML suspension Take 30 mLs by mouth as needed for mild constipation.   Marland Kitchen morphine (ROXANOL) 20 MG/ML concentrated solution Take 5 mg by mouth every 2 (two) hours as needed for severe pain.  Marland Kitchen morphine (ROXANOL) 20 MG/ML concentrated solution Take 0.25 mLs (5 mg total) by mouth 2 (two) times daily. AND 5 mg every 2 hours as needed for pain or distress  . NON FORMULARY Diet Type:  Regular  . Nutritional Supplements (ENSURE ENLIVE PO) Take 1 Bottle by mouth 3 (three) times daily between meals.  . pantoprazole (PROTONIX) 40 MG tablet Take 40 mg by mouth every morning.   Vladimir Faster Glycol-Propyl Glycol (SYSTANE) 0.4-0.3 % SOLN Place 1 drop into both eyes 2 (two) times daily between meals.   . polyethylene glycol (MIRALAX / GLYCOLAX) packet Take 17 g by mouth daily.   . Wound Dressings (ALLEVYN ADHESIVE EX) Apply allevyn foam dressing topically to bilateral heels and change every 3 days and as needed for prevention   No facility-administered encounter medications on file as of 01/20/2019.      SIGNIFICANT DIAGNOSTIC EXAMS   PREVIOUS:   07-10-18: chest x-ray: No acute abnormality noted.  07-10-18: ct of head: Chronic atrophic and ischemic changes without acute abnormality.  07-11-18: 2-d echo:  1. The left ventricle has  normal systolic function with an ejection fraction of 60-65%. The cavity size was normal. Left ventricular diastolic Doppler parameters are indeterminate.  2. The right ventricle has normal systolic function. The cavity was normal. There is no increase in right ventricular wall thickness.  3. Left atrial size was moderately dilated.  4. The mitral valve is normal in structure. Mild thickening of the mitral valve leaflet. There is mild mitral annular calcification present.  5. The tricuspid valve is normal in structure.  6. The aortic valve is tricuspid Mild thickening of the aortic valve.  7. The pulmonic valve was normal in structure.  8. Normal LV function; moderate LAE; mild MR and TR.  07-12-18: ct of head: 1. Atrophy and white matter microvascular disease not changed from prior. 2. No acute findings.  TODAY;   01-08-19: left hip fracture: Displaced and angulated oblique fracture of the proximal third of the left femoral diaphysis.  01-08-19: chest x-ray: No active disease. Severe scoliosis   01-08-19: left femur x-ray: Displaced and angulated oblique fracture of the proximal third of the left femoral diaphysis.   LABS REVIEWED PREVIOUS:   07-10-18: wbc 6.3; hgb 12.3; hct 38.6; mcv 105.2; plt 219; glucose 108; bun 17 creat 0.67; k+ 3.8; na++ 142; ca 9.0 liver normal albumin 4.4 urine culture: no growth urine drug screen neg 07-11-18: chol 178; ldl 98  trig 65 hdl 67; tsh 0.831; hgb a1c 5.5 bit B 12 204  07-13-18: glucose 65; bun 21; creat 0.76; k+ 3.2; an++ 139; ca 8.8 mag 1.8; phos 3.1 07-14-18: glucose 120; bun 11; creat 0.63; k+ 3.5; na++ 139  07-19-18: wbc 10.5; hgb 10.8; hct 32.9; mcv 99.4 plt 186; glucose bun 19; creat 0.53;  85; k+ 2.6; na++ 139 ca 8.4 07-26-18: wbc 14.2; hgb 12.3; hct 39.3; mcv 105.1; plt 405; glucose 96; bun 54; creat 0.84; k+ 4.0; na++ 148; ca 9.2  01-08-19: wbc 13.3; hgb 10.8; hct 35.0; mcv 100.9; plt 313; glucose 106; bun 22; creat 0.59; k+ 4.1; na++ 140; ca 8.3; liver  normal albumin 3.1 01-12-19: wbc 13.5; hgb 7.9; hct 24.4; mcv 96.8; plt 267; glucose 92; bun 23; creat 0.78; k+ 3.1; na++ 142; ca 8.5   TODAY;   01-18-19: wbc 8.4; hgb 8.5; hct 27.2; mcv 103.4 plt 404; glucose 80; bun 22; creat 0.52; k+ 3.6 na++ 144; ca 8.2     Review of Systems  Unable to perform ROS: Dementia (unable to participate )    Physical Exam Constitutional:      General: She is not in acute distress.    Appearance: She is cachectic. She is not diaphoretic.  Neck:     Musculoskeletal: Neck supple.     Thyroid: No thyromegaly.  Cardiovascular:     Rate and Rhythm: Normal rate and regular rhythm.     Pulses: Normal pulses.     Heart sounds: Normal heart sounds.  Pulmonary:     Effort: Pulmonary effort is normal. No respiratory distress.     Breath sounds: Normal breath sounds.  Abdominal:     General: Bowel sounds are normal. There is no distension.     Palpations: Abdomen is soft.     Tenderness: There is no abdominal tenderness.  Musculoskeletal:     Right lower leg: No edema.     Left lower leg: No edema.     Comments:  Is able to move all extremities Is status post left femur fracture    Lymphadenopathy:     Cervical: No cervical adenopathy.  Skin:    General: Skin is warm and dry.     Comments: Incision line without signs of infection present   Neurological:     Mental Status: She is alert. Mental status is at baseline.  Psychiatric:        Mood and Affect: Mood normal.       ASSESSMENT/ PLAN:  TODAY:   1. Chronic pain syndrome; stable her  pain is presently managed will continue roxanol 5 mg twice daily and every 2 hours as needed  2.  Displaced oblique fracture of shaft of left femur sequela: is without change the focus of her care is comfort. Will continue lovenox 20 mg daily for total of 30 days; will continue roxanol 5 mg twice daily as needed   3.  Dementia without behavioral disturbance no significant change in her status; her current weight is  71 pounds ( previous weight: 72 pounds) will continue to monitor her status; unfortunately weight loss is an expected outcome at the last stage of this disease process.   PREVIOUS  4. Tachy/brady syndrome: is without change in status: is not a candidate for a pace maker will monitor  5. Chronic diastolic congestive heart failure: is presently compensated will monitor   6. PUD (peptic ulcer disease) is stable will continue protonix 40 mg daily   7. Chronic constipation: is stable will continue colace daily and miralax daily   8. Protein calorie malnutrition severe/ failure to thrive in adult: no significant change weight is 72 pounds; will continue ensure three times daily   9. Macrocytic anemia: is stable hgb 7.9 will monitor   10. Generalized anxiety disorder: is without change: is off xanax     MD is aware of resident's narcotic use and is in agreement with current plan of care. We will attempt to wean resident as appropriate.  Ok Edwards NP [facility]  Contact [phone] Monday through Friday 8am- 5pm  After hours call [phone]

## 2019-01-21 ENCOUNTER — Encounter: Payer: Self-pay | Admitting: Internal Medicine

## 2019-01-26 ENCOUNTER — Encounter: Payer: Self-pay | Admitting: Adult Health

## 2019-01-26 ENCOUNTER — Non-Acute Institutional Stay (SKILLED_NURSING_FACILITY): Payer: Medicare HMO | Admitting: Adult Health

## 2019-01-26 DIAGNOSIS — F0390 Unspecified dementia without behavioral disturbance: Secondary | ICD-10-CM

## 2019-01-26 DIAGNOSIS — R69 Illness, unspecified: Secondary | ICD-10-CM | POA: Diagnosis not present

## 2019-01-26 DIAGNOSIS — S72332S Displaced oblique fracture of shaft of left femur, sequela: Secondary | ICD-10-CM | POA: Diagnosis not present

## 2019-01-26 DIAGNOSIS — R627 Adult failure to thrive: Secondary | ICD-10-CM | POA: Diagnosis not present

## 2019-01-26 NOTE — Progress Notes (Signed)
Location:    [facility] Room Number: 119/P Place of Service:  SNF (31)   CODE STATUS: DNR  Allergies  Allergen Reactions   Sulfa Antibiotics Other (See Comments)    Reaction:  Unknown    Aspirin Other (See Comments)    Reaction:  Burning of pts stomach     Chief Complaint  Patient presents with   Acute Visit    Care Plan Meeting    HPI:  We have come together for her care plan meeting. She has family present via phone. Unable to do BIMS mood 03/30. Her weight is stable at 71 pounds. She has not had any falls. Her pain is managed. The focus of her care is comfort. There have been no reports of agitation or insomnia present.   Past Medical History:  Diagnosis Date   Anemia    Arthritis    Cancer ([place])    uterine surg only   CHF (congestive heart failure) ([place])    Colitis 12/31/2012   presumed C.Diff. patient declined colonoscopy   Hypotension    Kidney stone    Muscle weakness (generalized)    Osteoarthritis    Pancreatic atrophy 12/31/2012   Pneumonia     Past Surgical History:  Procedure Laterality Date   ABDOMINAL HYSTERECTOMY     CHOLECYSTECTOMY     ESOPHAGOGASTRODUODENOSCOPY N/A 03/03/2013   RMR: Significant gastric ulcer without bleeding stigmata requiring no endoscopic intervention today. Noncritical Schatzki's ring. Small hiatal hernia   ESOPHAGOGASTRODUODENOSCOPY N/A 08/24/2013   Dr. [name]: Hiatal hernia.  Gastric mucosal scar formation at site of previously noted gastric ulcerations   INTRAMEDULLARY (IM) NAIL INTERTROCHANTERIC Left 01/10/2019   Procedure: INTRAMEDULLARY (IM) NAIL LEFT INTERTROCHANTRIC;  Surgeon: Leandrew Koyanagi, MD;  Location: [facility];  Service: Orthopedics;  Laterality: Left;   JOINT REPLACEMENT     LEFT KNEE SURGERY AND REVISION     RIGHT HIP SURGERY      Social History   Socioeconomic History   Marital status: Widowed    Spouse name: Not on file   Number of children: Not on file   Years  of education: Not on file   Highest education level: Not on file  Occupational History   Not on file  Social Needs   Financial resource strain: Not on file   Food insecurity    Worry: Not on file    Inability: Not on file   Transportation needs    Medical: Not on file    Non-medical: Not on file  Tobacco Use   Smoking status: Former Smoker    Types: Cigarettes    Quit date: 04/25/1951    Years since quitting: 67.8   Smokeless tobacco: Never Used  Substance and Sexual Activity   Alcohol use: No    Alcohol/week: 1.0 standard drinks    Types: 1 Glasses of wine per week   Drug use: No   Sexual activity: Never  Lifestyle   Physical activity    Days per week: Not on file    Minutes per session: Not on file   Stress: Not on file  Relationships   Social connections    Talks on phone: Not on file    Gets together: Not on file    Attends religious service: Not on file    Active member of club or organization: Not on file    Attends meetings of clubs or organizations: Not on file    Relationship status: Not on file  Intimate partner violence    Fear of current or ex partner: Not on file    Emotionally abused: Not on file    Physically abused: Not on file    Forced sexual activity: Not on file  Other Topics Concern   Not on file  Social History Narrative   Former cigarette smoker.  Widowed.   Family History  Problem Relation Age of Onset   Cancer Mother       VITAL SIGNS BP 108/68    Pulse 72    Temp 98.4 F (36.9 C) (Oral)    Resp 18    Ht 4\' 5"  (1.346 m)    Wt 71 lb 3.2 oz (32.3 kg)    SpO2 93%    BMI 17.82 kg/m   Outpatient Encounter Medications as of 01/26/2019  Medication Sig   acetaminophen (TYLENOL) 500 MG tablet Take 500 mg by mouth every 6 (six) hours as needed for mild pain, moderate pain, fever or headache.    albuterol (PROAIR HFA) 108 (90 BASE) MCG/ACT inhaler Inhale 2 puffs into the lungs every 6 (six) hours as needed for wheezing or  shortness of breath.    alum & mag hydroxide-simeth (MAALOX REGULAR STRENGTH) 200-200-20 MG/5ML suspension Take 15 mLs by mouth every 6 (six) hours as needed for indigestion or heartburn.   Balsam Peru-Castor Oil (VENELEX) OINT Apply topically to sacrum and bilateral buttocks every shift and as needed for blanchable erythema   docusate sodium (COLACE) 100 MG capsule Take 100 mg by mouth daily.    enoxaparin (LOVENOX) 30 MG/0.3ML injection Inject 0.3 mLs (30 mg total) into the skin daily.   magnesium hydroxide (MILK OF MAGNESIA) 400 MG/5ML suspension Take 30 mLs by mouth as needed for mild constipation.    morphine (ROXANOL) 20 MG/ML concentrated solution Take 5 mg by mouth every 2 (two) hours as needed for severe pain.   morphine (ROXANOL) 20 MG/ML concentrated solution Take 0.25 mLs (5 mg total) by mouth 2 (two) times daily. AND 5 mg every 2 hours as needed for pain or distress   NON FORMULARY Diet Type:  Regular   Nutritional Supplements (ENSURE ENLIVE PO) Take 1 Bottle by mouth 3 (three) times daily between meals.   pantoprazole (PROTONIX) 40 MG tablet Take 40 mg by mouth every morning.    Polyethyl Glycol-Propyl Glycol (SYSTANE) 0.4-0.3 % SOLN Place 1 drop into both eyes 2 (two) times daily between meals.    polyethylene glycol (MIRALAX / GLYCOLAX) packet Take 17 g by mouth daily.    Wound Dressings (ALLEVYN ADHESIVE EX) Apply allevyn foam dressing topically to bilateral heels and change every 3 days and as needed for prevention   No facility-administered encounter medications on file as of 01/26/2019.      SIGNIFICANT DIAGNOSTIC EXAMS    PREVIOUS:   07-10-18: chest x-ray: No acute abnormality noted.  07-10-18: ct of head: Chronic atrophic and ischemic changes without acute abnormality.  07-11-18: 2-d echo:  1. The left ventricle has normal systolic function with an ejection fraction of 60-65%. The cavity size was normal. Left ventricular diastolic Doppler parameters are  indeterminate.  2. The right ventricle has normal systolic function. The cavity was normal. There is no increase in right ventricular wall thickness.  3. Left atrial size was moderately dilated.  4. The mitral valve is normal in structure. Mild thickening of the mitral valve leaflet. There is mild mitral annular calcification present.  5. The tricuspid valve is normal in structure.  6.  The aortic valve is tricuspid Mild thickening of the aortic valve.  7. The pulmonic valve was normal in structure.  8. Normal LV function; moderate LAE; mild MR and TR.  07-12-18: ct of head: 1. Atrophy and white matter microvascular disease not changed from prior. 2. No acute findings.  TODAY;   01-08-19: left hip fracture: Displaced and angulated oblique fracture of the proximal third of the left femoral diaphysis.  01-08-19: chest x-ray: No active disease. Severe scoliosis   01-08-19: left femur x-ray: Displaced and angulated oblique fracture of the proximal third of the left femoral diaphysis.   LABS REVIEWED PREVIOUS:   07-10-18: wbc 6.3; hgb 12.3; hct 38.6; mcv 105.2; plt 219; glucose 108; bun 17 creat 0.67; k+ 3.8; na++ 142; ca 9.0 liver normal albumin 4.4 urine culture: no growth urine drug screen neg 07-11-18: chol 178; ldl 98  trig 65 hdl 67; tsh 0.831; hgb a1c 5.5 bit B 12 204  07-13-18: glucose 65; bun 21; creat 0.76; k+ 3.2; an++ 139; ca 8.8 mag 1.8; phos 3.1 07-14-18: glucose 120; bun 11; creat 0.63; k+ 3.5; na++ 139  07-19-18: wbc 10.5; hgb 10.8; hct 32.9; mcv 99.4 plt 186; glucose bun 19; creat 0.53;  85; k+ 2.6; na++ 139 ca 8.4 07-26-18: wbc 14.2; hgb 12.3; hct 39.3; mcv 105.1; plt 405; glucose 96; bun 54; creat 0.84; k+ 4.0; na++ 148; ca 9.2  01-08-19: wbc 13.3; hgb 10.8; hct 35.0; mcv 100.9; plt 313; glucose 106; bun 22; creat 0.59; k+ 4.1; na++ 140; ca 8.3; liver normal albumin 3.1 01-12-19: wbc 13.5; hgb 7.9; hct 24.4; mcv 96.8; plt 267; glucose 92; bun 23; creat 0.78; k+ 3.1; na++ 142; ca 8.5    01-18-19: wbc 8.4; hgb 8.5; hct 27.2; mcv 103.4 plt 404; glucose 80; bun 22; creat 0.52; k+ 3.6 na++ 144; ca 8.2   NO NEW LABS    Review of Systems  Unable to perform ROS: Dementia (unable to participate )    Physical Exam Constitutional:      General: She is not in acute distress.    Appearance: She is cachectic. She is not diaphoretic.  Neck:     Musculoskeletal: Neck supple.     Thyroid: No thyromegaly.  Cardiovascular:     Rate and Rhythm: Normal rate and regular rhythm.     Pulses: Normal pulses.     Heart sounds: Normal heart sounds.  Pulmonary:     Effort: Pulmonary effort is normal. No respiratory distress.     Breath sounds: Normal breath sounds.  Abdominal:     General: Bowel sounds are normal. There is no distension.     Palpations: Abdomen is soft.     Tenderness: There is no abdominal tenderness.  Musculoskeletal:     Right lower leg: No edema.     Left lower leg: No edema.     Comments: Is able to move all extremities Is status post left femur fracture   01-08-19  Lymphadenopathy:     Cervical: No cervical adenopathy.  Skin:    General: Skin is warm and dry.  Neurological:     Mental Status: She is alert. Mental status is at baseline.  Psychiatric:        Mood and Affect: Mood normal.      ASSESSMENT/ PLAN:  TODAY  1. Dementia without behavioral disturbance unspecified dementia type 2. Failure to thrive in adult 3. Displaced oblique fracture of shaft of left femur sequela  Will continue current medications Will  continue current plan of care Will monitor her status.       MD is aware of resident's narcotic use and is in agreement with current plan of care. We will attempt to wean resident as appropriate.  Ok Edwards NP [facility]  Contact [phone] Monday through Friday 8am- 5pm  After hours call [phone]

## 2019-01-27 ENCOUNTER — Non-Acute Institutional Stay (SKILLED_NURSING_FACILITY): Payer: Medicare HMO | Admitting: Adult Health

## 2019-01-27 ENCOUNTER — Encounter: Payer: Self-pay | Admitting: Adult Health

## 2019-01-27 DIAGNOSIS — K279 Peptic ulcer, site unspecified, unspecified as acute or chronic, without hemorrhage or perforation: Secondary | ICD-10-CM

## 2019-01-27 DIAGNOSIS — I495 Sick sinus syndrome: Secondary | ICD-10-CM

## 2019-01-27 DIAGNOSIS — I5032 Chronic diastolic (congestive) heart failure: Secondary | ICD-10-CM

## 2019-01-27 NOTE — Progress Notes (Signed)
Location:    [place] Room Number: 119/P Place of Service:  SNF (31)   CODE STATUS: DNR  Allergies  Allergen Reactions  . Sulfa Antibiotics Other (See Comments)    Reaction:  Unknown   . Aspirin Other (See Comments)    Reaction:  Burning of pts stomach     Chief Complaint  Patient presents with  . Medical Management of Chronic Issues        Tachy/brady syndrome:   Chronic diastolic congestive heart failure:  PUD (peptic ulcer disease)   Weekly follow up for the first 30 days post hospitalization.     HPI:  [name] is a 85 year old long term resident of this facility being seen for the management of her chronic illnesses: tachy/brady; heart failure; pud. There are no reports of uncontrolled pain; no changes in her appetite; no reports of agitation.   Past Medical History:  Diagnosis Date  . Anemia   . Arthritis   . Cancer ([place])    uterine surg only  . CHF (congestive heart failure) ([place])   . Colitis 12/31/2012   presumed C.Diff. patient declined colonoscopy  . Hypotension   . Kidney stone   . Muscle weakness (generalized)   . Osteoarthritis   . Pancreatic atrophy 12/31/2012  . Pneumonia     Past Surgical History:  Procedure Laterality Date  . ABDOMINAL HYSTERECTOMY    . CHOLECYSTECTOMY    . ESOPHAGOGASTRODUODENOSCOPY N/A 03/03/2013   RMR: Significant gastric ulcer without bleeding stigmata requiring no endoscopic intervention today. Noncritical Schatzki's ring. Small hiatal hernia  . ESOPHAGOGASTRODUODENOSCOPY N/A 08/24/2013   Dr. [name]: Hiatal hernia.  Gastric mucosal scar formation at site of previously noted gastric ulcerations  . INTRAMEDULLARY (IM) NAIL INTERTROCHANTERIC Left 01/10/2019   Procedure: INTRAMEDULLARY (IM) NAIL LEFT INTERTROCHANTRIC;  Surgeon: Leandrew Koyanagi, MD;  Location: [facility];  Service: Orthopedics;  Laterality: Left;  . JOINT REPLACEMENT    . LEFT KNEE SURGERY AND REVISION    . RIGHT HIP SURGERY      Social History    Socioeconomic History  . Marital status: Widowed    Spouse name: Not on file  . Number of children: Not on file  . Years of education: Not on file  . Highest education level: Not on file  Occupational History  . Not on file  Social Needs  . Financial resource strain: Not on file  . Food insecurity    Worry: Not on file    Inability: Not on file  . Transportation needs    Medical: Not on file    Non-medical: Not on file  Tobacco Use  . Smoking status: Former Smoker    Types: Cigarettes    Quit date: 04/25/1951    Years since quitting: 67.8  . Smokeless tobacco: Never Used  Substance and Sexual Activity  . Alcohol use: No    Alcohol/week: 1.0 standard drinks    Types: 1 Glasses of wine per week  . Drug use: No  . Sexual activity: Never  Lifestyle  . Physical activity    Days per week: Not on file    Minutes per session: Not on file  . Stress: Not on file  Relationships  . Social Herbalist on phone: Not on file    Gets together: Not on file    Attends religious service: Not on file    Active member of club or organization: Not on file    Attends  meetings of clubs or organizations: Not on file    Relationship status: Not on file  . Intimate partner violence    Fear of current or ex partner: Not on file    Emotionally abused: Not on file    Physically abused: Not on file    Forced sexual activity: Not on file  Other Topics Concern  . Not on file  Social History Narrative   Former cigarette smoker.  Widowed.   Family History  Problem Relation Age of Onset  . Cancer Mother       VITAL SIGNS BP (!) 89/46   Pulse 80   Temp 97.9 F (36.6 C) (Oral)   Resp 20   Ht 4\' 5"  (1.346 m)   Wt 71 lb 3.2 oz (32.3 kg)   SpO2 93%   BMI 17.82 kg/m   Outpatient Encounter Medications as of 01/27/2019  Medication Sig  . acetaminophen (TYLENOL) 500 MG tablet Take 500 mg by mouth every 6 (six) hours as needed for mild pain, moderate pain, fever or headache.   .  albuterol (PROAIR HFA) 108 (90 BASE) MCG/ACT inhaler Inhale 2 puffs into the lungs every 6 (six) hours as needed for wheezing or shortness of breath.   Marland Kitchen alum & mag hydroxide-simeth (MAALOX REGULAR STRENGTH) 200-200-20 MG/5ML suspension Take 15 mLs by mouth every 6 (six) hours as needed for indigestion or heartburn.  [name] Peru-Castor Oil (VENELEX) OINT Apply topically to sacrum and bilateral buttocks every shift and as needed for blanchable erythema  . docusate sodium (COLACE) 100 MG capsule Take 100 mg by mouth daily.   . magnesium hydroxide (MILK OF MAGNESIA) 400 MG/5ML suspension Take 30 mLs by mouth as needed for mild constipation.   Marland Kitchen morphine (ROXANOL) 20 MG/ML concentrated solution Take 5 mg by mouth every 2 (two) hours as needed for severe pain.  Marland Kitchen morphine (ROXANOL) 20 MG/ML concentrated solution Take 0.25 mLs (5 mg total) by mouth 2 (two) times daily. AND 5 mg every 2 hours as needed for pain or distress  . NON FORMULARY Diet Type:  Regular  . Nutritional Supplements (ENSURE ENLIVE PO) Take 1 Bottle by mouth 3 (three) times daily between meals.  . pantoprazole (PROTONIX) 40 MG tablet Take 40 mg by mouth every morning.   Vladimir Faster Glycol-Propyl Glycol (SYSTANE) 0.4-0.3 % SOLN Place 1 drop into both eyes 2 (two) times daily between meals.   . polyethylene glycol (MIRALAX / GLYCOLAX) packet Take 17 g by mouth daily.   . Wound Dressings (ALLEVYN ADHESIVE EX) Apply allevyn foam dressing topically to bilateral heels and change every 3 days and as needed for prevention  . [DISCONTINUED] enoxaparin (LOVENOX) 30 MG/0.3ML injection Inject 0.3 mLs (30 mg total) into the skin daily.   No facility-administered encounter medications on file as of 01/27/2019.      SIGNIFICANT DIAGNOSTIC EXAMS   PREVIOUS:   07-10-18: chest x-ray: No acute abnormality noted.  07-10-18: ct of head: Chronic atrophic and ischemic changes without acute abnormality.  07-11-18: 2-d echo:  1. The left ventricle has  normal systolic function with an ejection fraction of 60-65%. The cavity size was normal. Left ventricular diastolic Doppler parameters are indeterminate.  2. The right ventricle has normal systolic function. The cavity was normal. There is no increase in right ventricular wall thickness.  3. Left atrial size was moderately dilated.  4. The mitral valve is normal in structure. Mild thickening of the mitral valve leaflet. There is mild mitral annular calcification  present.  5. The tricuspid valve is normal in structure.  6. The aortic valve is tricuspid Mild thickening of the aortic valve.  7. The pulmonic valve was normal in structure.  8. Normal LV function; moderate LAE; mild MR and TR.  07-12-18: ct of head: 1. Atrophy and white matter microvascular disease not changed from prior. 2. No acute findings.  TODAY;   01-08-19: left hip fracture: Displaced and angulated oblique fracture of the proximal third of the left femoral diaphysis.  01-08-19: chest x-ray: No active disease. Severe scoliosis   01-08-19: left femur x-ray: Displaced and angulated oblique fracture of the proximal third of the left femoral diaphysis.   LABS REVIEWED PREVIOUS:   07-10-18: wbc 6.3; hgb 12.3; hct 38.6; mcv 105.2; plt 219; glucose 108; bun 17 creat 0.67; k+ 3.8; na++ 142; ca 9.0 liver normal albumin 4.4 urine culture: no growth urine drug screen neg 07-11-18: chol 178; ldl 98  trig 65 hdl 67; tsh 0.831; hgb a1c 5.5 bit B 12 204  07-13-18: glucose 65; bun 21; creat 0.76; k+ 3.2; an++ 139; ca 8.8 mag 1.8; phos 3.1 07-14-18: glucose 120; bun 11; creat 0.63; k+ 3.5; na++ 139  07-19-18: wbc 10.5; hgb 10.8; hct 32.9; mcv 99.4 plt 186; glucose bun 19; creat 0.53;  85; k+ 2.6; na++ 139 ca 8.4 07-26-18: wbc 14.2; hgb 12.3; hct 39.3; mcv 105.1; plt 405; glucose 96; bun 54; creat 0.84; k+ 4.0; na++ 148; ca 9.2  01-08-19: wbc 13.3; hgb 10.8; hct 35.0; mcv 100.9; plt 313; glucose 106; bun 22; creat 0.59; k+ 4.1; na++ 140; ca 8.3; liver  normal albumin 3.1 01-12-19: wbc 13.5; hgb 7.9; hct 24.4; mcv 96.8; plt 267; glucose 92; bun 23; creat 0.78; k+ 3.1; na++ 142; ca 8.5  01-18-19: wbc 8.4; hgb 8.5; hct 27.2; mcv 103.4 plt 404; glucose 80; bun 22; creat 0.52; k+ 3.6 na++ 144; ca 8.2   NO NEW LABS   Review of Systems  Unable to perform ROS: Dementia (unable to participate )     Physical Exam Constitutional:      General: She is not in acute distress.    Appearance: She is cachectic. She is not diaphoretic.  Neck:     Musculoskeletal: Neck supple.     Thyroid: No thyromegaly.  Cardiovascular:     Rate and Rhythm: Normal rate. Rhythm irregular.     Pulses: Normal pulses.     Heart sounds: Normal heart sounds.  Pulmonary:     Effort: Pulmonary effort is normal. No respiratory distress.     Breath sounds: Normal breath sounds.  Abdominal:     General: Bowel sounds are normal. There is no distension.     Palpations: Abdomen is soft.     Tenderness: There is no abdominal tenderness.  Musculoskeletal:     Right lower leg: No edema.     Left lower leg: No edema.     Comments:  Is able to move all extremities Is status post left femur fracture   01-08-19   Lymphadenopathy:     Cervical: No cervical adenopathy.  Skin:    General: Skin is warm and dry.  Neurological:     Mental Status: She is alert. Mental status is at baseline.  Psychiatric:        Mood and Affect: Mood normal.       ASSESSMENT/ PLAN:  TODAY:   1. Tachy/brady syndrome: is without change in status not a candidate for pace maker will monitor  2. Chronic diastolic congestive heart failure: is compensated will monitor   3. PUD (peptic ulcer disease) is stable will continue protonix 40 mg daily    PREVIOUS  4. Chronic constipation: is stable will continue colace daily and miralax daily   5. Protein calorie malnutrition severe/ failure to thrive in adult: no significant change weight is 71 pounds; will continue ensure three times daily   6.  Macrocytic anemia: is stable hgb 7.9 will monitor   7. Generalized anxiety disorder: is without change: is off xanax  8. Chronic pain syndrome; stable her pain is presently managed will continue roxanol 5 mg twice daily and every 2 hours as needed  9.  Displaced oblique fracture of shaft of left femur sequela: is without change the focus of her care is comfort. Will  will continue roxanol 5 mg twice daily and every 2 hours  as needed   10.  Dementia without behavioral disturbance no significant change in her status; her current weight is 71 pounds will continue to monitor her status; unfortunately weight loss is an expected outcome at the last stage of this disease process.   MD is aware of resident's narcotic use and is in agreement with current plan of care. We will attempt to wean resident as appropriate.  Ok Edwards NP [facility]  Contact [phone] Monday through Friday 8am- 5pm  After hours call [phone]

## 2019-02-03 ENCOUNTER — Non-Acute Institutional Stay (SKILLED_NURSING_FACILITY): Payer: Medicare HMO | Admitting: Adult Health

## 2019-02-03 ENCOUNTER — Encounter: Payer: Self-pay | Admitting: Adult Health

## 2019-02-03 DIAGNOSIS — K5909 Other constipation: Secondary | ICD-10-CM | POA: Diagnosis not present

## 2019-02-03 DIAGNOSIS — E43 Unspecified severe protein-calorie malnutrition: Secondary | ICD-10-CM | POA: Diagnosis not present

## 2019-02-03 DIAGNOSIS — R627 Adult failure to thrive: Secondary | ICD-10-CM

## 2019-02-03 DIAGNOSIS — D539 Nutritional anemia, unspecified: Secondary | ICD-10-CM

## 2019-02-03 NOTE — Progress Notes (Signed)
Location:    [place] Room Number: 119/P Place of Service:  SNF (31)   CODE STATUS: DNR  Allergies  Allergen Reactions  . Sulfa Antibiotics Other (See Comments)    Reaction:  Unknown   . Aspirin Other (See Comments)    Reaction:  Burning of pts stomach     Chief Complaint  Patient presents with  . Medical Management of Chronic Issues          Chronic constipation:  Protein calorie malnutrition severe/failure to thrive in adult: Macrocytic anemia:  Weekly follow up for the first 30 days post hospitalization.       HPI:  She is a 85 year old long term resident of this facility being seen for the management of her chronic illnesses: constipation; malnutrition; ftt; anemia. There are no reports of uncontrolled pain; no reports of anxiety or agitation. Her appetite remains poor does drink supplements.   Past Medical History:  Diagnosis Date  . Anemia   . Arthritis   . Cancer ([place])    uterine surg only  . CHF (congestive heart failure) ([place])   . Colitis 12/31/2012   presumed C.Diff. patient declined colonoscopy  . Hypotension   . Kidney stone   . Muscle weakness (generalized)   . Osteoarthritis   . Pancreatic atrophy 12/31/2012  . Pneumonia     Past Surgical History:  Procedure Laterality Date  . ABDOMINAL HYSTERECTOMY    . CHOLECYSTECTOMY    . ESOPHAGOGASTRODUODENOSCOPY N/A 03/03/2013   RMR: Significant gastric ulcer without bleeding stigmata requiring no endoscopic intervention today. Noncritical Schatzki's ring. Small hiatal hernia  . ESOPHAGOGASTRODUODENOSCOPY N/A 08/24/2013   Dr. [name]: Hiatal hernia.  Gastric mucosal scar formation at site of previously noted gastric ulcerations  . INTRAMEDULLARY (IM) NAIL INTERTROCHANTERIC Left 01/10/2019   Procedure: INTRAMEDULLARY (IM) NAIL LEFT INTERTROCHANTRIC;  Surgeon: Leandrew Koyanagi, MD;  Location: [facility];  Service: Orthopedics;  Laterality: Left;  . JOINT REPLACEMENT    . LEFT KNEE SURGERY AND  REVISION    . RIGHT HIP SURGERY      Social History   Socioeconomic History  . Marital status: Widowed    Spouse name: Not on file  . Number of children: Not on file  . Years of education: Not on file  . Highest education level: Not on file  Occupational History  . Not on file  Social Needs  . Financial resource strain: Not on file  . Food insecurity    Worry: Not on file    Inability: Not on file  . Transportation needs    Medical: Not on file    Non-medical: Not on file  Tobacco Use  . Smoking status: Former Smoker    Types: Cigarettes    Quit date: 04/25/1951    Years since quitting: 67.8  . Smokeless tobacco: Never Used  Substance and Sexual Activity  . Alcohol use: No    Alcohol/week: 1.0 standard drinks    Types: 1 Glasses of wine per week  . Drug use: No  . Sexual activity: Never  Lifestyle  . Physical activity    Days per week: Not on file    Minutes per session: Not on file  . Stress: Not on file  Relationships  . Social Herbalist on phone: Not on file    Gets together: Not on file    Attends religious service: Not on file    Active member of club or organization: Not  on file    Attends meetings of clubs or organizations: Not on file    Relationship status: Not on file  . Intimate partner violence    Fear of current or ex partner: Not on file    Emotionally abused: Not on file    Physically abused: Not on file    Forced sexual activity: Not on file  Other Topics Concern  . Not on file  Social History Narrative   Former cigarette smoker.  Widowed.   Family History  Problem Relation Age of Onset  . Cancer Mother       VITAL SIGNS BP (!) 89/46   Pulse 80   Temp 98.2 F (36.8 C) (Oral)   Resp 20   Ht 4\' 5"  (1.346 m)   Wt 71 lb 3.2 oz (32.3 kg)   SpO2 93%   BMI 17.82 kg/m   Outpatient Encounter Medications as of 02/03/2019  Medication Sig  . acetaminophen (TYLENOL) 500 MG tablet Take 500 mg by mouth every 6 (six) hours as  needed for mild pain, moderate pain, fever or headache.   . albuterol (PROAIR HFA) 108 (90 BASE) MCG/ACT inhaler Inhale 2 puffs into the lungs every 6 (six) hours as needed for wheezing or shortness of breath.   Marland Kitchen alum & mag hydroxide-simeth (MAALOX REGULAR STRENGTH) 200-200-20 MG/5ML suspension Take 15 mLs by mouth every 6 (six) hours as needed for indigestion or heartburn.  Roseanne Kaufman Peru-Castor Oil (VENELEX) OINT Apply topically to sacrum and bilateral buttocks every shift and as needed for blanchable erythema  . docusate sodium (COLACE) 100 MG capsule Take 100 mg by mouth daily.   . magnesium hydroxide (MILK OF MAGNESIA) 400 MG/5ML suspension Take 30 mLs by mouth as needed for mild constipation.   Marland Kitchen morphine (ROXANOL) 20 MG/ML concentrated solution Take 5 mg by mouth every 2 (two) hours as needed for severe pain.  Marland Kitchen morphine (ROXANOL) 20 MG/ML concentrated solution Take 0.25 mLs (5 mg total) by mouth 2 (two) times daily. AND 5 mg every 2 hours as needed for pain or distress  . NON FORMULARY Diet Type:  Regular  . Nutritional Supplements (ENSURE ENLIVE PO) Take 1 Bottle by mouth 3 (three) times daily between meals.  . pantoprazole (PROTONIX) 40 MG tablet Take 40 mg by mouth every morning.   Vladimir Faster Glycol-Propyl Glycol (SYSTANE) 0.4-0.3 % SOLN Place 1 drop into both eyes 2 (two) times daily between meals.   . polyethylene glycol (MIRALAX / GLYCOLAX) packet Take 17 g by mouth daily.   . Wound Dressings (ALLEVYN ADHESIVE EX) Apply allevyn foam dressing topically to bilateral heels and change every 3 days and as needed for prevention   No facility-administered encounter medications on file as of 02/03/2019.      SIGNIFICANT DIAGNOSTIC EXAMS   PREVIOUS:   07-10-18: chest x-ray: No acute abnormality noted.  07-10-18: ct of head: Chronic atrophic and ischemic changes without acute abnormality.  07-11-18: 2-d echo:  1. The left ventricle has normal systolic function with an ejection fraction of  60-65%. The cavity size was normal. Left ventricular diastolic Doppler parameters are indeterminate.  2. The right ventricle has normal systolic function. The cavity was normal. There is no increase in right ventricular wall thickness.  3. Left atrial size was moderately dilated.  4. The mitral valve is normal in structure. Mild thickening of the mitral valve leaflet. There is mild mitral annular calcification present.  5. The tricuspid valve is normal in structure.  6.  The aortic valve is tricuspid Mild thickening of the aortic valve.  7. The pulmonic valve was normal in structure.  8. Normal LV function; moderate LAE; mild MR and TR.  07-12-18: ct of head: 1. Atrophy and white matter microvascular disease not changed from prior. 2. No acute findings.  01-08-19: left hip fracture: Displaced and angulated oblique fracture of the proximal third of the left femoral diaphysis.  01-08-19: chest x-ray: No active disease. Severe scoliosis   01-08-19: left femur x-ray: Displaced and angulated oblique fracture of the proximal third of the left femoral diaphysis.  NO NEW EXAMS.    LABS REVIEWED PREVIOUS:   07-10-18: wbc 6.3; hgb 12.3; hct 38.6; mcv 105.2; plt 219; glucose 108; bun 17 creat 0.67; k+ 3.8; na++ 142; ca 9.0 liver normal albumin 4.4 urine culture: no growth urine drug screen neg 07-11-18: chol 178; ldl 98  trig 65 hdl 67; tsh 0.831; hgb a1c 5.5 bit B 12 204  07-13-18: glucose 65; bun 21; creat 0.76; k+ 3.2; an++ 139; ca 8.8 mag 1.8; phos 3.1 07-14-18: glucose 120; bun 11; creat 0.63; k+ 3.5; na++ 139  07-19-18: wbc 10.5; hgb 10.8; hct 32.9; mcv 99.4 plt 186; glucose bun 19; creat 0.53;  85; k+ 2.6; na++ 139 ca 8.4 07-26-18: wbc 14.2; hgb 12.3; hct 39.3; mcv 105.1; plt 405; glucose 96; bun 54; creat 0.84; k+ 4.0; na++ 148; ca 9.2  01-08-19: wbc 13.3; hgb 10.8; hct 35.0; mcv 100.9; plt 313; glucose 106; bun 22; creat 0.59; k+ 4.1; na++ 140; ca 8.3; liver normal albumin 3.1 01-12-19: wbc 13.5; hgb 7.9;  hct 24.4; mcv 96.8; plt 267; glucose 92; bun 23; creat 0.78; k+ 3.1; na++ 142; ca 8.5  01-18-19: wbc 8.4; hgb 8.5; hct 27.2; mcv 103.4 plt 404; glucose 80; bun 22; creat 0.52; k+ 3.6 na++ 144; ca 8.2   NO NEW LABS     Review of Systems  Unable to perform ROS: Dementia (unable to participate )   Physical Exam Constitutional:      General: She is not in acute distress.    Appearance: She is cachectic. She is not diaphoretic.  Neck:     Musculoskeletal: Neck supple.     Thyroid: No thyromegaly.  Cardiovascular:     Rate and Rhythm: Normal rate. Rhythm irregular.     Pulses: Normal pulses.     Heart sounds: Normal heart sounds.  Pulmonary:     Effort: Pulmonary effort is normal. No respiratory distress.     Breath sounds: Normal breath sounds.  Abdominal:     General: Bowel sounds are normal. There is no distension.     Palpations: Abdomen is soft.     Tenderness: There is no abdominal tenderness.  Musculoskeletal:     Right lower leg: No edema.     Left lower leg: No edema.     Comments: Is able to move all extremities Is status post left femur fracture  Status post nail 01-09-19   Lymphadenopathy:     Cervical: No cervical adenopathy.  Skin:    General: Skin is warm and dry.  Neurological:     Mental Status: She is alert. Mental status is at baseline.  Psychiatric:        Mood and Affect: Mood normal.       ASSESSMENT/ PLAN:  TODAY:   1. Chronic constipation: is stable will continue colace and miralax daily   2. Protein calorie malnutrition severe/failure to thrive in adult: no significant change  in status; weight 71 pounds will continue ensure three times daily   3. Macrocytic anemia: is without change hgb 7.9 will monitor the focus of her care is comfort.   PREVIOUS  4. Generalized anxiety disorder: is without change: is off xanax  5. Chronic pain syndrome; stable her pain is presently managed will continue roxanol 5 mg twice daily and every 2 hours as needed   6.  Displaced oblique fracture of shaft of left femur sequela: is without change the focus of her care is comfort. Will  will continue roxanol 5 mg twice daily and every 2 hours  as needed   7.  Dementia without behavioral disturbance no significant change in her status; her current weight is 71 pounds will continue to monitor her status; unfortunately weight loss is an expected outcome at the last stage of this disease process.   8. Tachy/brady syndrome: is without change in status not a candidate for pace maker will monitor  9. Chronic diastolic congestive heart failure: is compensated will monitor   10. PUD (peptic ulcer disease) is stable will continue protonix 40 mg daily     MD is aware of resident's narcotic use and is in agreement with current plan of care. We will attempt to wean resident as appropriate.  Ok Edwards NP [facility]  Contact [phone] Monday through Friday 8am- 5pm  After hours call [phone]

## 2019-02-15 ENCOUNTER — Inpatient Hospital Stay
Admission: RE | Admit: 2019-02-15 | Discharge: 2019-06-13 | Disposition: E | Payer: Medicare HMO | Source: Ambulatory Visit | Attending: Internal Medicine | Admitting: Internal Medicine

## 2019-02-15 ENCOUNTER — Encounter (HOSPITAL_COMMUNITY): Payer: Self-pay

## 2019-02-15 ENCOUNTER — Emergency Department (HOSPITAL_COMMUNITY)
Admission: EM | Admit: 2019-02-15 | Discharge: 2019-02-15 | Disposition: A | Discharge status: Skilled Nursing Facility | Payer: Medicare HMO | Attending: Emergency Medicine | Admitting: Emergency Medicine

## 2019-02-15 ENCOUNTER — Emergency Department (HOSPITAL_COMMUNITY): Payer: Medicare HMO

## 2019-02-15 ENCOUNTER — Other Ambulatory Visit: Payer: Self-pay

## 2019-02-15 DIAGNOSIS — Z01818 Encounter for other preprocedural examination: Secondary | ICD-10-CM | POA: Diagnosis not present

## 2019-02-15 DIAGNOSIS — W19XXXA Unspecified fall, initial encounter: Secondary | ICD-10-CM | POA: Diagnosis not present

## 2019-02-15 DIAGNOSIS — W19XXXD Unspecified fall, subsequent encounter: Secondary | ICD-10-CM

## 2019-02-15 DIAGNOSIS — R69 Illness, unspecified: Secondary | ICD-10-CM | POA: Diagnosis not present

## 2019-02-15 DIAGNOSIS — S79911A Unspecified injury of right hip, initial encounter: Secondary | ICD-10-CM | POA: Diagnosis not present

## 2019-02-15 DIAGNOSIS — I5032 Chronic diastolic (congestive) heart failure: Secondary | ICD-10-CM | POA: Insufficient documentation

## 2019-02-15 DIAGNOSIS — F0390 Unspecified dementia without behavioral disturbance: Secondary | ICD-10-CM | POA: Diagnosis not present

## 2019-02-15 DIAGNOSIS — M25551 Pain in right hip: Secondary | ICD-10-CM | POA: Diagnosis not present

## 2019-02-15 DIAGNOSIS — I517 Cardiomegaly: Secondary | ICD-10-CM | POA: Diagnosis not present

## 2019-02-15 DIAGNOSIS — Z87891 Personal history of nicotine dependence: Secondary | ICD-10-CM | POA: Diagnosis not present

## 2019-02-15 DIAGNOSIS — R52 Pain, unspecified: Principal | ICD-10-CM

## 2019-02-15 HISTORY — DX: Peptic ulcer, site unspecified, unspecified as acute or chronic, without hemorrhage or perforation: K27.9

## 2019-02-15 HISTORY — DX: Encephalopathy, unspecified: G93.40

## 2019-02-15 HISTORY — DX: Scoliosis, unspecified: M41.9

## 2019-02-15 HISTORY — DX: Sick sinus syndrome: I49.5

## 2019-02-15 HISTORY — DX: Unspecified dementia, unspecified severity, without behavioral disturbance, psychotic disturbance, mood disturbance, and anxiety: F03.90

## 2019-02-15 HISTORY — DX: Adult failure to thrive: R62.7

## 2019-02-15 HISTORY — DX: Cerebral infarction, unspecified: I63.9

## 2019-02-15 HISTORY — DX: Polyosteoarthritis, unspecified: M15.9

## 2019-02-15 LAB — BASIC METABOLIC PANEL
Anion gap: 8 (ref 5–15)
BUN: 20 mg/dL (ref 8–23)
CO2: 24 mmol/L (ref 22–32)
Calcium: 8.5 mg/dL — ABNORMAL LOW (ref 8.9–10.3)
Chloride: 107 mmol/L (ref 98–111)
Creatinine, Ser: 0.7 mg/dL (ref 0.44–1.00)
GFR calc Af Amer: >60 mL/min (ref >60)
GFR calc non Af Amer: >60 mL/min (ref >60)
Glucose, Bld: 104 mg/dL — ABNORMAL HIGH (ref 70–99)
Potassium: 3.4 mmol/L — ABNORMAL LOW (ref 3.5–5.1)
Sodium: 139 mmol/L (ref 135–145)

## 2019-02-15 LAB — CBC WITH DIFFERENTIAL/PLATELET
Abs Immature Granulocytes: 0.02 10*3/uL (ref 0.00–0.07)
Basophils Absolute: 0 10*3/uL (ref 0.0–0.1)
Basophils Relative: 1 %
Eosinophils Absolute: 0.1 10*3/uL (ref 0.0–0.5)
Eosinophils Relative: 2 %
HCT: 32.7 % — ABNORMAL LOW (ref 36.0–46.0)
Hemoglobin: 9.9 g/dL — ABNORMAL LOW (ref 12.0–15.0)
Immature Granulocytes: 0 %
Lymphocytes Relative: 21 %
Lymphs Abs: 1.6 10*3/uL (ref 0.7–4.0)
MCH: 32.5 pg (ref 26.0–34.0)
MCHC: 30.3 g/dL (ref 30.0–36.0)
MCV: 107.2 fL — ABNORMAL HIGH (ref 80.0–100.0)
Monocytes Absolute: 0.6 10*3/uL (ref 0.1–1.0)
Monocytes Relative: 8 %
Neutro Abs: 5.2 10*3/uL (ref 1.7–7.7)
Neutrophils Relative %: 68 %
Platelets: 306 10*3/uL (ref 150–400)
RBC: 3.05 MIL/uL — ABNORMAL LOW (ref 3.87–5.11)
RDW: 16.6 % — ABNORMAL HIGH (ref 11.5–15.5)
WBC: 7.6 10*3/uL (ref 4.0–10.5)
nRBC: 0 % (ref 0.0–0.2)

## 2019-02-15 LAB — PROTIME-INR
INR: 1.2 (ref 0.8–1.2)
Prothrombin Time: 14.7 seconds (ref 11.4–15.2)

## 2019-02-15 LAB — TYPE AND SCREEN
ABO/RH(D): A POS
Antibody Screen: NEGATIVE

## 2019-02-15 MED ORDER — FENTANYL CITRATE (PF) 100 MCG/2ML IJ SOLN
50.0000 ug | INTRAMUSCULAR | Status: DC | PRN
  Administered 2019-02-15: 50 ug via INTRAVENOUS
  Filled 2019-02-15: qty 2

## 2019-02-15 NOTE — ED Provider Notes (Signed)
[facility] EMERGENCY DEPARTMENT Provider Note   CSN: [id number] Arrival date & time: 02/17/2019  1800     History   Chief Complaint Chief Complaint  Patient presents with   Hip Pain    HPI [name] is a 85 y.o. female.     HPI  This patient is a 85 year old female with a known history of a right-sided hip fracture and surgery in the past, she presents with an apparent injury to the right hip, she states that she fell, she does not know exactly what happened to her, but according to the EMS report the patient is a resident at the [facility], she was found laying on the floor.  She was also noted to have shortening and rotation of the right leg.  She was moaning, she does not remember exactly how this happened.  Level 5 caveat applies secondary to confusion  Past Medical History:  Diagnosis Date   Anemia    Arthritis    Cancer ([place])    uterine surg only   CHF (congestive heart failure) ([place])    Colitis 12/31/2012   presumed C.Diff. patient declined colonoscopy   Dementia ([place])    Encephalopathy    Failure to thrive in adult    Hypotension    Kidney stone    Muscle weakness (generalized)    Osteoarthritis    Pancreatic atrophy 12/31/2012   Peptic ulcer    Pneumonia    Polyosteoarthritis    Scoliosis    Sick sinus syndrome ([place])    Stroke [facility])     Patient Active Problem List   Diagnosis Date Noted   Displaced oblique fracture of shaft of left femur, sequela 01/08/2019   Dementia without behavioral disturbance ([place]) 07/31/2018   Generalized pain 07/31/2018   Failure to thrive in adult 07/19/2018   Generalized anxiety disorder 07/18/2018   Tachy-brady syndrome ([place]) 07/18/2018   Confusion 07/10/2018   Chronic pain 07/10/2018   Closed fracture of distal end of right humerus with routine healing 01/04/2018   Multiple rib fractures 05/09/2016   Falls 05/09/2016   Leukocytosis 05/09/2016   Chest pain 03/13/2015   Other  specified fever    Pain in the chest    Abdominal pain 10/28/2014   Anemia 10/28/2014   Hypokalemia 10/28/2014   Protein-calorie malnutrition, severe ([place]) 10/28/2014   Macrocytic anemia 08/13/2014   Chronic diastolic CHF (congestive heart failure) ([place]) 08/13/2014   PUD (peptic ulcer disease) 10/05/2013   Acute gastric ulcer due to Helicobacter pylori 123XX123   Chronic constipation 02/23/2013   Right kidney stone 12/31/2012   Hypotension 12/31/2012   Pancreatic atrophy 12/31/2012    Past Surgical History:  Procedure Laterality Date   ABDOMINAL HYSTERECTOMY     CHOLECYSTECTOMY     ESOPHAGOGASTRODUODENOSCOPY N/A 03/03/2013   RMR: Significant gastric ulcer without bleeding stigmata requiring no endoscopic intervention today. Noncritical Schatzki's ring. Small hiatal hernia   ESOPHAGOGASTRODUODENOSCOPY N/A 08/24/2013   Dr. [name]: Hiatal hernia.  Gastric mucosal scar formation at site of previously noted gastric ulcerations   INTRAMEDULLARY (IM) NAIL INTERTROCHANTERIC Left 01/10/2019   Procedure: INTRAMEDULLARY (IM) NAIL LEFT INTERTROCHANTRIC;  Surgeon: Leandrew Koyanagi, MD;  Location: [place];  Service: Orthopedics;  Laterality: Left;   JOINT REPLACEMENT     LEFT KNEE SURGERY AND REVISION     RIGHT HIP SURGERY       OB History    Gravida  3   Para  2   Term  2  Preterm      AB  1   Living        SAB  1   TAB      Ectopic      Multiple      Live Births               Home Medications    Prior to Admission medications   Medication Sig Start Date End Date Taking? Authorizing Provider  acetaminophen (TYLENOL) 500 MG tablet Take 500 mg by mouth every 6 (six) hours as needed for mild pain, moderate pain, fever or headache.  07/15/18  Yes [provider]  albuterol (PROAIR HFA) 108 (90 BASE) MCG/ACT inhaler Inhale 2 puffs into the lungs every 6 (six) hours as needed for wheezing or shortness of breath.  07/15/18  Yes [provider]  alum & mag hydroxide-simeth (MAALOX REGULAR STRENGTH) [id number] MG/5ML suspension Take 15 mLs by mouth every 6 (six) hours as needed for indigestion or heartburn. 08/16/16  Yes Kinnie Feil, PA-C  Balsam [place]-Castor Oil [place]) OINT Apply 1 application topically See admin instructions. Apply topically to sacrum and bilateral buttocks every shift and as needed for blanchable erythema  07/16/18  Yes [provider]  docusate sodium (COLACE) 100 MG capsule Take 100 mg by mouth daily.  07/15/18  Yes [provider]  influenza vac split quadrivalent PF (FLUARIX QUADRIVALENT) 0.5 ML injection Inject 0.5 mLs into the muscle once.   Yes [provider]  morphine (ROXANOL) 20 MG/ML concentrated solution Take 5 mg by mouth every 2 (two) hours as needed for severe pain.   Yes [provider]  morphine (ROXANOL) 20 MG/ML concentrated solution Take 0.25 mLs (5 mg total) by mouth 2 (two) times daily. AND 5 mg every 2 hours as needed for pain or distress Patient taking differently: Take 5 mg by mouth 2 (two) times daily. AND 5 mg every 2 hours as needed for uncontrolled pain 01/20/19  Yes Gerlene Fee, NP  Nutritional Supplements (ENSURE ENLIVE PO) Take 1 Bottle by mouth 3 (three) times daily between meals. 07/21/18  Yes [provider]  pantoprazole (PROTONIX) 40 MG tablet Take 40 mg by mouth every morning.    Yes [provider]  Polyethyl Glycol-Propyl Glycol (SYSTANE) 0.4-0.3 % SOLN Place 1 drop into both eyes 2 (two) times daily between meals.  10/13/18  Yes [provider]  polyethylene glycol (MIRALAX / GLYCOLAX) packet Take 17 g by mouth daily.    Yes [provider]  Wound Dressings (ALLEVYN ADHESIVE EX) Apply allevyn foam dressing topically to bilateral heels and change every 3 days and as needed for prevention 07/26/18  Yes [provider]    Family History Family History  Problem Relation Age of Onset   Cancer  Mother     Social History Social History   Tobacco Use   Smoking status: Former Smoker    Types: Cigarettes    Quit date: 04/25/1951    Years since quitting: 67.8   Smokeless tobacco: Never Used  Substance Use Topics   Alcohol use: No    Alcohol/week: 1.0 standard drinks    Types: 1 Glasses of wine per week   Drug use: No     Allergies   Sulfa antibiotics and Aspirin   Review of Systems Review of Systems  Unable to perform ROS: Dementia     Physical Exam Updated Vital Signs BP 127/66 (BP Location: Right Arm)    Pulse  60    Temp 98.2 F (36.8 C) (Oral)    SpO2 96%   Physical Exam Vitals signs and nursing note reviewed.  Constitutional:      General: She is not in acute distress.    Appearance: She is well-developed.  HENT:     Head: Normocephalic and atraumatic.     Mouth/Throat:     Pharynx: No oropharyngeal exudate.  Eyes:     General: No scleral icterus.       Right eye: No discharge.        Left eye: No discharge.     Conjunctiva/sclera: Conjunctivae normal.     Pupils: Pupils are equal, round, and reactive to light.  Neck:     Musculoskeletal: Normal range of motion and neck supple.     Thyroid: No thyromegaly.     Vascular: No JVD.  Cardiovascular:     Rate and Rhythm: Normal rate and regular rhythm.     Heart sounds: Normal heart sounds. No murmur. No friction rub. No gallop.   Pulmonary:     Effort: Pulmonary effort is normal. No respiratory distress.     Breath sounds: Normal breath sounds. No wheezing or rales.  Abdominal:     General: Bowel sounds are normal. There is no distension.     Palpations: Abdomen is soft. There is no mass.     Tenderness: There is no abdominal tenderness.  Musculoskeletal:        General: Tenderness and deformity present. No swelling.     Comments: Shortening and internal rotation of the right lower extremity, pain at the hip, pain with any range of motion of the hip, no other joint abnormalities, no other  signs of trauma to the arms legs spine or head  Lymphadenopathy:     Cervical: No cervical adenopathy.  Skin:    General: Skin is warm and dry.     Findings: No erythema or rash.  Neurological:     Mental Status: She is alert.     Coordination: Coordination normal.     Comments: The patient speaks but does not make much sense, she does not recall exactly what happened, she knows that her right leg does not look right.  Psychiatric:        Behavior: Behavior normal.      ED Treatments / Results  Labs (all labs ordered are listed, but only abnormal results are displayed) Labs Reviewed  BASIC METABOLIC PANEL - Abnormal; Notable for the following components:      Result Value   Potassium 3.4 (*)    Glucose, Bld 104 (*)    Calcium 8.5 (*)    All other components within normal limits  CBC WITH DIFFERENTIAL/PLATELET - Abnormal; Notable for the following components:   RBC 3.05 (*)    Hemoglobin 9.9 (*)    HCT 32.7 (*)    MCV 107.2 (*)    RDW 16.6 (*)    All other components within normal limits  PROTIME-INR  TYPE AND SCREEN    EKG None  Radiology Dg Chest 1 View  Result Date: 02/18/2019 CLINICAL DATA:  Preop exam EXAM: CHEST  1 VIEW COMPARISON:  January 08, 2019 FINDINGS: The mediastinal contour and cardiac silhouette are stable. The heart size is enlarged. There is no focal infiltrate, pulmonary edema, or pleural effusion. Bony structures are stable. Scoliosis of the spine are noted. IMPRESSION: No acute abnormality identified. Electronically Signed   By: Abelardo Diesel M.D.   On: 02/13/2019  19:29   Dg Hip Unilat With Pelvis 2-3 Views Right  Result Date: 03/12/2019 CLINICAL DATA:  Status post fall. EXAM: DG HIP (WITH OR WITHOUT PELVIS) 2-3V RIGHT COMPARISON:  March 11, 2018 FINDINGS: Right hip replacement with deformity of the right acetabulum are unchanged. There is no dislocation. Patient status post prior fixation left femur. Chronic deformity of the left superior and  inferior pubic rami are unchanged. No acute fracture is identified. IMPRESSION: Chronic changes of bilateral hips. No acute fracture or dislocation noted. Electronically Signed   By: Abelardo Diesel M.D.   On: 02/14/2019 19:27    Procedures Procedures (including critical care time)  Medications Ordered in ED Medications  fentaNYL (SUBLIMAZE) injection 50 mcg (50 mcg Intravenous Given 02/14/2019 1925)     Initial Impression / Assessment and Plan / ED Course  I have reviewed the triage vital signs and the nursing notes.  Pertinent labs & imaging results that were available during my care of the patient were reviewed by me and considered in my medical decision making (see chart for details).  Clinical Course as of Feb 15 1943  Tue Feb 15, 2019  1941 I have personally looked at the x-rays, there does not appear to be any acute infiltrates in the lungs, the hips and pelvis are chronically degenerative but no fractures or dislocations.  The patient's disproportionate leg length is likely chronic.  There is no obvious deformities.  I do not see any other signs of injury.  The patient has no other complaints, vital signs are reassuring, hemoglobin is higher than usual and electrolytes and kidney function are normal as well.  Patient is stable   [BM]    Clinical Course User Index [BM] Noemi Chapel, MD       The patient is 85 years old, chronically ill, has apparent either dislocation or fracture of her right hip.  X-ray pending, will need to repair or replace or relocate.    Final Clinical Impressions(s) / ED Diagnoses   Final diagnoses:  Fall, initial encounter       Noemi Chapel, MD 02/20/2019 1944

## 2019-02-15 NOTE — ED Triage Notes (Signed)
EMS reports pt is a [facility].  Reports staff found pt laying in floor.  Shortening and rotation noted to r leg.  Pt moaning.

## 2019-02-15 NOTE — Discharge Instructions (Signed)
No signs of abnormal blood work No signs of fracture of the legs / hips or pelvis. Chronic changes in the pelvis / hips - arthritis Family doctor as needed

## 2019-02-16 ENCOUNTER — Non-Acute Institutional Stay (SKILLED_NURSING_FACILITY): Payer: Medicare HMO | Admitting: Internal Medicine

## 2019-02-16 ENCOUNTER — Encounter: Payer: Self-pay | Admitting: Internal Medicine

## 2019-02-16 DIAGNOSIS — I5032 Chronic diastolic (congestive) heart failure: Secondary | ICD-10-CM

## 2019-02-16 DIAGNOSIS — K279 Peptic ulcer, site unspecified, unspecified as acute or chronic, without hemorrhage or perforation: Secondary | ICD-10-CM | POA: Diagnosis not present

## 2019-02-16 DIAGNOSIS — F0390 Unspecified dementia without behavioral disturbance: Secondary | ICD-10-CM | POA: Diagnosis not present

## 2019-02-16 DIAGNOSIS — R69 Illness, unspecified: Secondary | ICD-10-CM | POA: Diagnosis not present

## 2019-02-16 NOTE — Progress Notes (Signed)
Location:  [place] Room Number: 119-P Place of Service:  SNF (31)  Hennie Duos, MD  Patient Care Team: Hennie Duos, MD as PCP - General (Internal Medicine) Dorothy Spark, MD as PCP - Cardiology (Cardiology) Constance Haw, MD as PCP - Electrophysiology (Cardiology) [name] Cristopher Estimable, MD as Consulting Physician (Gastroenterology) [facility] [place] ([place]) Gerlene Fee, NP as Nurse Practitioner (Geriatric Medicine)  Extended Emergency Contact Information Primary Emergency Contact: Schoenrock,Gloria [place] Phone: [phone] Relation: Daughter Secondary Emergency Contact: Hutchinson,Marilynn  [place] Phone: [phone] Relation: Daughter    Allergies: Sulfa antibiotics and Aspirin  Chief Complaint  Patient presents with  . Medical Management of Chronic Issues    Routine [place] visit    HPI: Patient is a 85 y.o. female who is is comfort care but who is being seen for routine issues of diastolic congestive heart failure, peptic ulcer disease, and dementia.  Past Medical History:  Diagnosis Date  . Anemia   . Arthritis   . Cancer ([place])    uterine surg only  . CHF (congestive heart failure) ([place])   . Colitis 12/31/2012   presumed C.Diff. patient declined colonoscopy  . Dementia ([place])   . Encephalopathy   . Failure to thrive in adult   . Hypotension   . Kidney stone   . Muscle weakness (generalized)   . Osteoarthritis   . Pancreatic atrophy 12/31/2012  . Peptic ulcer   . Pneumonia   . Polyosteoarthritis   . Scoliosis   . Sick sinus syndrome ([place])   . Stroke [facility])     Past Surgical History:  Procedure Laterality Date  . ABDOMINAL HYSTERECTOMY    . CHOLECYSTECTOMY    . ESOPHAGOGASTRODUODENOSCOPY N/A 03/03/2013   RMR: Significant gastric ulcer without bleeding stigmata requiring no endoscopic intervention today. Noncritical  Schatzki's ring. Small hiatal hernia  . ESOPHAGOGASTRODUODENOSCOPY N/A 08/24/2013   Dr. [name]: Hiatal hernia.  Gastric mucosal scar formation at site of previously noted gastric ulcerations  . INTRAMEDULLARY (IM) NAIL INTERTROCHANTERIC Left 01/10/2019   Procedure: INTRAMEDULLARY (IM) NAIL LEFT INTERTROCHANTRIC;  Surgeon: Leandrew Koyanagi, MD;  Location: [place];  Service: Orthopedics;  Laterality: Left;  . JOINT REPLACEMENT    . LEFT KNEE SURGERY AND REVISION    . RIGHT HIP SURGERY      Allergies as of 02/16/2019      Reactions   Sulfa Antibiotics Other (See Comments)   Reaction:  Unknown    Aspirin Other (See Comments)   Reaction:  Burning of pts stomach       Medication List    Notice   This visit is during an admission. Changes to the med list made in this visit will be reflected in the After Visit Summary of the admission.     No orders of the defined types were placed in this encounter.   Immunization History  Administered Date(s) Administered  . Influenza-Unspecified 02/14/2019  . PPD Test 07/15/2018, 07/28/2018    Social History   Tobacco Use  . Smoking status: Former Smoker    Types: Cigarettes    Quit date: 04/25/1951    Years since quitting: 67.8  . Smokeless tobacco: Never Used  Substance Use Topics  . Alcohol use: No    Alcohol/week: 1.0 standard drinks    Types: 1 Glasses of wine per week    Review of Systems    unable to obtain  from patient secondary to dementia; nursing- no concerns other than stated poor appetite, patient is comfort care     Vitals:   02/16/19 1131  BP: (!) 126/49  Pulse: (!) 57  Resp: 19  Temp: 98 F (36.7 C)   Body mass index is 17.82 kg/m. Physical Exam  GENERAL APPEARANCE: Alert, nonconversant, No acute distress  SKIN: No diaphoresis rash HEENT: Unremarkable RESPIRATORY: Breathing is even, unlabored. Lung sounds are clear   CARDIOVASCULAR: Heart RRR no murmurs, rubs or gallops. No peripheral edema  GASTROINTESTINAL:  Abdomen is soft, non-tender, not distended w/ normal bowel sounds.  GENITOURINARY: Bladder non tender, not distended  MUSCULOSKELETAL: No abnormal joints or musculature except very thin, cachectic NEUROLOGIC: Cranial nerves 2-12 grossly intact. Moves all extremities PSYCHIATRIC: Dementia, no behavioral issues  Patient Active Problem List   Diagnosis Date Noted  . Displaced oblique fracture of shaft of left femur, sequela 01/08/2019  . Dementia without behavioral disturbance ([place]) 07/31/2018  . Generalized pain 07/31/2018  . Failure to thrive in adult 07/19/2018  . Generalized anxiety disorder 07/18/2018  . Tachy-brady syndrome ([place]) 07/18/2018  . Confusion 07/10/2018  . Chronic pain 07/10/2018  . Closed fracture of distal end of right humerus with routine healing 01/04/2018  . Multiple rib fractures 05/09/2016  . Falls 05/09/2016  . Leukocytosis 05/09/2016  . Chest pain 03/13/2015  . Other specified fever   . Pain in the chest   . Abdominal pain 10/28/2014  . Anemia 10/28/2014  . Hypokalemia 10/28/2014  . Protein-calorie malnutrition, severe ([place]) 10/28/2014  . Macrocytic anemia 08/13/2014  . Chronic diastolic CHF (congestive heart failure) ([place]) 08/13/2014  . PUD (peptic ulcer disease) 10/05/2013  . Acute gastric ulcer due to Helicobacter pylori [id number]  . Chronic constipation 02/23/2013  . Right kidney stone 12/31/2012  . Hypotension 12/31/2012  . Pancreatic atrophy 12/31/2012    CMP     Component Value Date/Time   NA 139 03/05/2019 1822   K 3.4 (L) 03/11/2019 1822   CL 107 02/19/2019 1822   CO2 24 02/26/2019 1822   GLUCOSE 104 (H) 02/17/2019 1822   BUN 20 02/11/2019 1822   CREATININE 0.70 02/19/2019 1822   CALCIUM 8.5 (L) 03/03/2019 1822   PROT 6.1 (L) 01/08/2019 0335   PROT 6.1 07/29/2013   ALBUMIN 3.1 (L) 01/08/2019 0335   ALBUMIN 4.0 07/29/2013   AST 18 01/08/2019 0335   AST 18 07/29/2013   ALT 13 01/08/2019 0335   ALKPHOS 63 01/08/2019 0335   ALKPHOS  73.0 07/29/2013   BILITOT 0.5 01/08/2019 0335   BILITOT 0.5 07/29/2013   GFRNONAA >60 02/23/2019 1822   GFRAA >60 03/12/2019 1822   Recent Labs    07/13/18 1301  01/12/19 0317 01/18/19 0905 02/17/2019 1822  NA 139   < > 142 144 139  K 3.2*   < > 3.1* 3.6 3.4*  CL 107   < > 103 109 107  CO2 17*   < > 26 28 24   GLUCOSE 65*   < > 92 80 104*  BUN 21   < > 23 22 20   CREATININE 0.76   < > 0.78 0.52 0.70  CALCIUM 8.8*   < > 8.5* 8.2* 8.5*  MG 1.8  --   --   --   --   PHOS 3.1  --   --   --   --    < > = values in this interval not displayed.   Recent Labs    02/25/18  1852 07/10/18 1422 01/08/19 0335  AST 22 25 18   ALT 13 14 13   ALKPHOS 52 57 63  BILITOT 1.1 0.8 0.5  PROT 7.0 7.0 6.1*  ALBUMIN 4.6 4.4 3.1*   Recent Labs    07/10/18 1422  01/08/19 0335  01/12/19 0317 01/18/19 0905 02/14/2019 1822  WBC 6.3   < > 13.3*   < > 13.5* 8.4 7.6  NEUTROABS 4.1  --  10.5*  --   --   --  5.2  HGB 12.3   < > 10.8*   < > 7.9* 8.5* 9.9*  HCT 38.6   < > 35.0*   < > 24.4* 27.2* 32.7*  MCV 105.2*   < > 100.9*   < > 96.8 103.4* 107.2*  PLT 219   < > 313   < > 267 404* 306   < > = values in this interval not displayed.   Recent Labs    07/11/18 0429  CHOL 178  LDLCALC 98  TRIG 65   No results found for: [facility] Lab Results  Component Value Date   TSH 0.831 07/11/2018   Lab Results  Component Value Date   HGBA1C 5.5 07/11/2018   Lab Results  Component Value Date   CHOL 178 07/11/2018   HDL 67 07/11/2018   LDLCALC 98 07/11/2018   TRIG 65 07/11/2018   CHOLHDL 2.7 07/11/2018    Significant Diagnostic Results in last 30 days:  Dg Chest 1 View  Result Date: 03/07/2019 CLINICAL DATA:  Preop exam EXAM: CHEST  1 VIEW COMPARISON:  January 08, 2019 FINDINGS: The mediastinal contour and cardiac silhouette are stable. The heart size is enlarged. There is no focal infiltrate, pulmonary edema, or pleural effusion. Bony structures are stable. Scoliosis of the spine are noted.  IMPRESSION: No acute abnormality identified. Electronically Signed   By: Abelardo Diesel M.D.   On: 02/17/2019 19:29   Dg Knee 1-2 Views Right  Result Date: 02/18/2019 CLINICAL DATA:  Pain after a fall. EXAM: RIGHT KNEE - 1-2 VIEW COMPARISON:  None. FINDINGS: There is no acute distal femoral fracture seen of both the medial and lateral distal femoral metaphysis. It is unclear if the fracture extends to the articular surface of the femoral condyles. There is a moderate lipohemarthrosis. Moderate chondrocalcinosis is noted. There is diffuse osseous demineralization. IMPRESSION: Displaced distal femoral fracture with moderate lipohemarthrosis. No obvious intra-articular extension. CT could be considered for further characterization. These results will be called to the ordering clinician or representative by the Radiologist Assistant, and communication documented in the PACS or zVision Dashboard. Electronically Signed   By: Zerita Boers M.D.   On: 02/18/2019 10:57   Dg Hip Unilat With Pelvis 2-3 Views Right  Result Date: 03/11/2019 CLINICAL DATA:  Status post fall. EXAM: DG HIP (WITH OR WITHOUT PELVIS) 2-3V RIGHT COMPARISON:  March 11, 2018 FINDINGS: Right hip replacement with deformity of the right acetabulum are unchanged. There is no dislocation. Patient status post prior fixation left femur. Chronic deformity of the left superior and inferior pubic rami are unchanged. No acute fracture is identified. IMPRESSION: Chronic changes of bilateral hips. No acute fracture or dislocation noted. Electronically Signed   By: Abelardo Diesel M.D.   On: 02/18/2019 19:27   Dg Hips Bilat With Pelvis Min 5 Views  Result Date: 02/18/2019 CLINICAL DATA:  Pain after a fall EXAM: DG HIP (WITH OR WITHOUT PELVIS) 5+V BILAT COMPARISON:  Hip radiographs dated 03/05/2019 FINDINGS: There is unchanged deformity of  the right acetabulum with a total right hip arthroplasty in unchanged position. There is no dislocation. Internal  fixation of a proximal left femoral fracture is redemonstrated. There is no left hip dislocation. Chronic deformity of the left superior and inferior pubic rami is unchanged. Multiple gas-filled loops of bowel are partially imaged. There is diffuse osseous demineralization. IMPRESSION: 1. Unchanged deformity of the right acetabulum. Intact total right hip arthroplasty. 2. No acute findings are evident. Electronically Signed   By: Zerita Boers M.D.   On: 02/18/2019 10:50    Assessment and Plan  Chronic diastolic CHF (congestive heart failure) ([place]) Appears compensated; patient is on no medication; continue supportive care  PUD (peptic ulcer disease) No reported complaints, although patient is appetite is poor; continue Protonix 40 mg daily  Dementia without behavioral disturbance (HCC) End-stage, worsened by patient's recent hip fracture; poor appetite and p.o. intake patient is comfort care; continue supportive care    Hennie Duos, MD

## 2019-02-17 ENCOUNTER — Ambulatory Visit (HOSPITAL_COMMUNITY): Payer: Medicare HMO | Attending: Adult Health

## 2019-02-17 DIAGNOSIS — W19XXXA Unspecified fall, initial encounter: Secondary | ICD-10-CM | POA: Insufficient documentation

## 2019-02-17 DIAGNOSIS — S72401A Unspecified fracture of lower end of right femur, initial encounter for closed fracture: Secondary | ICD-10-CM | POA: Insufficient documentation

## 2019-02-17 DIAGNOSIS — S72491A Other fracture of lower end of right femur, initial encounter for closed fracture: Secondary | ICD-10-CM | POA: Diagnosis not present

## 2019-02-17 DIAGNOSIS — M25552 Pain in left hip: Secondary | ICD-10-CM | POA: Diagnosis not present

## 2019-02-17 DIAGNOSIS — S79911A Unspecified injury of right hip, initial encounter: Secondary | ICD-10-CM | POA: Diagnosis not present

## 2019-02-17 DIAGNOSIS — M25551 Pain in right hip: Secondary | ICD-10-CM | POA: Diagnosis not present

## 2019-02-17 DIAGNOSIS — M25561 Pain in right knee: Secondary | ICD-10-CM | POA: Diagnosis present

## 2019-02-17 DIAGNOSIS — S79912A Unspecified injury of left hip, initial encounter: Secondary | ICD-10-CM | POA: Diagnosis not present

## 2019-02-18 ENCOUNTER — Encounter: Payer: Self-pay | Admitting: Adult Health

## 2019-02-18 ENCOUNTER — Non-Acute Institutional Stay (SKILLED_NURSING_FACILITY): Payer: Medicare HMO | Admitting: Adult Health

## 2019-02-18 DIAGNOSIS — R627 Adult failure to thrive: Secondary | ICD-10-CM | POA: Diagnosis not present

## 2019-02-18 DIAGNOSIS — R69 Illness, unspecified: Secondary | ICD-10-CM | POA: Diagnosis not present

## 2019-02-18 DIAGNOSIS — F0150 Vascular dementia without behavioral disturbance: Secondary | ICD-10-CM | POA: Diagnosis not present

## 2019-02-18 NOTE — Progress Notes (Signed)
Location:    [facility] Room Number: 119/P Place of Service:  SNF (31)   CODE STATUS: DNR  Allergies  Allergen Reactions  . Sulfa Antibiotics Other (See Comments)    Reaction:  Unknown   . Aspirin Other (See Comments)    Reaction:  Burning of pts stomach     Chief Complaint  Patient presents with  . Acute Visit    Follow Up X-Ray    HPI:  She has had a fall with right left pain present. Her x-rays do not demonstrate any new fractures. The goal of her care is for comfort only. She has recently had a left hip nail for comfort measures. She continues with roxanol for pain management which is effective. There are no reports of further agitation.   Past Medical History:  Diagnosis Date  . Anemia   . Arthritis   . Cancer ([place])    uterine surg only  . CHF (congestive heart failure) ([place])   . Colitis 12/31/2012   presumed C.Diff. patient declined colonoscopy  . Dementia ([place])   . Encephalopathy   . Failure to thrive in adult   . Hypotension   . Kidney stone   . Muscle weakness (generalized)   . Osteoarthritis   . Pancreatic atrophy 12/31/2012  . Peptic ulcer   . Pneumonia   . Polyosteoarthritis   . Scoliosis   . Sick sinus syndrome ([place])   . Stroke [facility])     Past Surgical History:  Procedure Laterality Date  . ABDOMINAL HYSTERECTOMY    . CHOLECYSTECTOMY    . ESOPHAGOGASTRODUODENOSCOPY N/A 03/03/2013   RMR: Significant gastric ulcer without bleeding stigmata requiring no endoscopic intervention today. Noncritical Schatzki's ring. Small hiatal hernia  . ESOPHAGOGASTRODUODENOSCOPY N/A 08/24/2013   Dr. [name]: Hiatal hernia.  Gastric mucosal scar formation at site of previously noted gastric ulcerations  . INTRAMEDULLARY (IM) NAIL INTERTROCHANTERIC Left 01/10/2019   Procedure: INTRAMEDULLARY (IM) NAIL LEFT INTERTROCHANTRIC;  Surgeon: Leandrew Koyanagi, MD;  Location: [facility];  Service: Orthopedics;  Laterality: Left;  . JOINT REPLACEMENT    . LEFT KNEE  SURGERY AND REVISION    . RIGHT HIP SURGERY      Social History   Socioeconomic History  . Marital status: Widowed    Spouse name: Not on file  . Number of children: Not on file  . Years of education: Not on file  . Highest education level: Not on file  Occupational History  . Not on file  Social Needs  . Financial resource strain: Not on file  . Food insecurity    Worry: Not on file    Inability: Not on file  . Transportation needs    Medical: Not on file    Non-medical: Not on file  Tobacco Use  . Smoking status: Former Smoker    Types: Cigarettes    Quit date: 04/25/1951    Years since quitting: 67.8  . Smokeless tobacco: Never Used  Substance and Sexual Activity  . Alcohol use: No    Alcohol/week: 1.0 standard drinks    Types: 1 Glasses of wine per week  . Drug use: No  . Sexual activity: Never  Lifestyle  . Physical activity    Days per week: Not on file    Minutes per session: Not on file  . Stress: Not on file  Relationships  . Social Herbalist on phone: Not on file    Gets together: Not on file  Attends religious service: Not on file    Active member of club or organization: Not on file    Attends meetings of clubs or organizations: Not on file    Relationship status: Not on file  . Intimate partner violence    Fear of current or ex partner: Not on file    Emotionally abused: Not on file    Physically abused: Not on file    Forced sexual activity: Not on file  Other Topics Concern  . Not on file  Social History Narrative   Former cigarette smoker.  Widowed.   Family History  Problem Relation Age of Onset  . Cancer Mother       VITAL SIGNS BP 105/60   Pulse 63   Temp 99 F (37.2 C) (Oral)   Resp 20   Ht 4\' 5"  (1.346 m)   Wt 71 lb 3.2 oz (32.3 kg)   SpO2 93%   BMI 17.82 kg/m   Outpatient Encounter Medications as of 02/18/2019  Medication Sig  . acetaminophen (TYLENOL) 500 MG tablet Take 500 mg by mouth every 6 (six) hours  as needed for mild pain, moderate pain, fever or headache.   . albuterol (PROAIR HFA) 108 (90 BASE) MCG/ACT inhaler Inhale 2 puffs into the lungs every 6 (six) hours as needed for wheezing or shortness of breath.   Marland Kitchen alum & mag hydroxide-simeth (MAALOX REGULAR STRENGTH) 200-200-20 MG/5ML suspension Take 15 mLs by mouth every 6 (six) hours as needed for indigestion or heartburn.  Roseanne Kaufman Peru-Castor Oil (VENELEX) OINT Apply 1 application topically See admin instructions. Apply topically to sacrum and bilateral buttocks every shift and as needed for blanchable erythema   . docusate sodium (COLACE) 100 MG capsule Take 100 mg by mouth daily.   . magnesium hydroxide (MILK OF MAGNESIA) 400 MG/5ML suspension Take 30 mLs by mouth daily as needed for mild constipation.  Marland Kitchen morphine (ROXANOL) 20 MG/ML concentrated solution Take 5 mg by mouth every 2 (two) hours as needed. Uncontrolled pain  . morphine (ROXANOL) 20 MG/ML concentrated solution Take 5 mg by mouth 2 (two) times daily. Uncontrolled pain  . NON FORMULARY Diet: Regular  . Nutritional Supplements (ENSURE ENLIVE PO) Take 1 Bottle by mouth 3 (three) times daily between meals.  . pantoprazole (PROTONIX) 40 MG tablet Take 40 mg by mouth every morning.   Vladimir Faster Glycol-Propyl Glycol (SYSTANE) 0.4-0.3 % SOLN Place 1 drop into both eyes 2 (two) times daily between meals.   . polyethylene glycol (MIRALAX / GLYCOLAX) packet Take 17 g by mouth daily.   . Wound Dressings (ALLEVYN ADHESIVE EX) Apply allevyn foam dressing topically to bilateral heels and change every 3 days and as needed for prevention  . [DISCONTINUED] morphine (ROXANOL) 20 MG/ML concentrated solution Take 0.25 mLs (5 mg total) by mouth 2 (two) times daily. AND 5 mg every 2 hours as needed for pain or distress (Patient taking differently: Take 5 mg by mouth 2 (two) times daily. )   No facility-administered encounter medications on file as of 02/18/2019.      SIGNIFICANT DIAGNOSTIC EXAMS    PREVIOUS:   07-10-18: chest x-ray: No acute abnormality noted.  07-10-18: ct of head: Chronic atrophic and ischemic changes without acute abnormality.  07-11-18: 2-d echo:  1. The left ventricle has normal systolic function with an ejection fraction of 60-65%. The cavity size was normal. Left ventricular diastolic Doppler parameters are indeterminate.  2. The right ventricle has normal systolic function. The cavity  was normal. There is no increase in right ventricular wall thickness.  3. Left atrial size was moderately dilated.  4. The mitral valve is normal in structure. Mild thickening of the mitral valve leaflet. There is mild mitral annular calcification present.  5. The tricuspid valve is normal in structure.  6. The aortic valve is tricuspid Mild thickening of the aortic valve.  7. The pulmonic valve was normal in structure.  8. Normal LV function; moderate LAE; mild MR and TR.  07-12-18: ct of head: 1. Atrophy and white matter microvascular disease not changed from prior. 2. No acute findings.  01-08-19: left hip fracture: Displaced and angulated oblique fracture of the proximal third of the left femoral diaphysis.  01-08-19: chest x-ray: No active disease. Severe scoliosis   01-08-19: left femur x-ray: Displaced and angulated oblique fracture of the proximal third of the left femoral diaphysis.  TODAY;   02/13/2019: bilateral hip and pelvic x-ray; Chronic changes of bilateral hips. No acute fracture or dislocation noted.  02-17-19: bilateral hip and pelvic x-ray: 1. Unchanged deformity of the right acetabulum. Intact total right hip arthroplasty. 2. No acute findings are evident.  02-17-19: right knee x-ray; Displaced distal femoral fracture with moderate lipohemarthrosis. No obvious intra-articular extension. CT could be considered for further characterization   LABS REVIEWED PREVIOUS:   07-10-18: wbc 6.3; hgb 12.3; hct 38.6; mcv 105.2; plt 219; glucose 108; bun 17 creat 0.67; k+  3.8; na++ 142; ca 9.0 liver normal albumin 4.4 urine culture: no growth urine drug screen neg 07-11-18: chol 178; ldl 98  trig 65 hdl 67; tsh 0.831; hgb a1c 5.5 bit B 12 204  07-13-18: glucose 65; bun 21; creat 0.76; k+ 3.2; an++ 139; ca 8.8 mag 1.8; phos 3.1 07-14-18: glucose 120; bun 11; creat 0.63; k+ 3.5; na++ 139  07-19-18: wbc 10.5; hgb 10.8; hct 32.9; mcv 99.4 plt 186; glucose bun 19; creat 0.53;  85; k+ 2.6; na++ 139 ca 8.4 07-26-18: wbc 14.2; hgb 12.3; hct 39.3; mcv 105.1; plt 405; glucose 96; bun 54; creat 0.84; k+ 4.0; na++ 148; ca 9.2  01-08-19: wbc 13.3; hgb 10.8; hct 35.0; mcv 100.9; plt 313; glucose 106; bun 22; creat 0.59; k+ 4.1; na++ 140; ca 8.3; liver normal albumin 3.1 01-12-19: wbc 13.5; hgb 7.9; hct 24.4; mcv 96.8; plt 267; glucose 92; bun 23; creat 0.78; k+ 3.1; na++ 142; ca 8.5  01-18-19: wbc 8.4; hgb 8.5; hct 27.2; mcv 103.4 plt 404; glucose 80; bun 22; creat 0.52; k+ 3.6 na++ 144; ca 8.2   TODAY   03/11/2019: wbc 7.6; hgb 9.9; hct 32.7; mcv 107.2 ;plt 306; glucose 104; bun 20; creat 0.70; k+ 3.4; an++ 139; ca 8.5    Review of Systems  Unable to perform ROS: Dementia (unable to participate )    Physical Exam Constitutional:      General: She is not in acute distress.    Appearance: She is cachectic. She is not diaphoretic.  Neck:     Musculoskeletal: Neck supple.     Thyroid: No thyromegaly.  Cardiovascular:     Rate and Rhythm: Normal rate. Rhythm irregular.     Pulses: Normal pulses.     Heart sounds: Normal heart sounds.  Pulmonary:     Effort: Pulmonary effort is normal. No respiratory distress.     Breath sounds: Normal breath sounds.  Abdominal:     General: Bowel sounds are normal. There is no distension.     Palpations: Abdomen is soft.  Tenderness: There is no abdominal tenderness.  Musculoskeletal:     Right lower leg: No edema.     Left lower leg: No edema.     Comments: Is status post left femur fracture  Status post nail 01-09-19  Does not want to move  right leg   Lymphadenopathy:     Cervical: No cervical adenopathy.  Skin:    General: Skin is warm and dry.  Neurological:     Mental Status: She is alert. Mental status is at baseline.  Psychiatric:        Mood and Affect: Mood normal.       ASSESSMENT/ PLAN:  TODAY  1. Vascular dementia without behavioral disturbance 2. Failure to thrive in adult  At this time there is no significant change in her status Will continue current plan of care and her current medications Will monitor her status.   MD is aware of resident's narcotic use and is in agreement with current plan of care. We will attempt to wean resident as appropriate.  Ok Edwards NP [facility]  Contact [phone] Monday through Friday 8am- 5pm  After hours call [phone]

## 2019-02-20 ENCOUNTER — Encounter: Payer: Self-pay | Admitting: Internal Medicine

## 2019-02-20 NOTE — Assessment & Plan Note (Signed)
Appears compensated; patient is on no medication; continue supportive care

## 2019-02-20 NOTE — Assessment & Plan Note (Signed)
No reported complaints, although patient is appetite is poor; continue Protonix 40 mg daily

## 2019-02-20 NOTE — Assessment & Plan Note (Signed)
End-stage, worsened by patient's recent hip fracture; poor appetite and p.o. intake patient is comfort care; continue supportive care

## 2019-03-02 ENCOUNTER — Other Ambulatory Visit: Payer: Self-pay | Admitting: Adult Health

## 2019-03-04 DIAGNOSIS — B351 Tinea unguium: Secondary | ICD-10-CM | POA: Diagnosis not present

## 2019-03-04 DIAGNOSIS — L603 Nail dystrophy: Secondary | ICD-10-CM | POA: Diagnosis not present

## 2019-03-04 DIAGNOSIS — I739 Peripheral vascular disease, unspecified: Secondary | ICD-10-CM | POA: Diagnosis not present

## 2019-03-13 DEATH — deceased

## 2019-03-14 ENCOUNTER — Other Ambulatory Visit: Payer: Self-pay | Admitting: Adult Health

## 2019-03-14 MED ORDER — MORPHINE SULFATE (CONCENTRATE) 20 MG/ML PO SOLN: 5.0000 mg | Freq: Two times a day (BID) | ORAL | 0 refills | Status: DC

## 2019-03-18 ENCOUNTER — Non-Acute Institutional Stay (SKILLED_NURSING_FACILITY): Payer: Medicare HMO | Admitting: Adult Health

## 2019-03-18 ENCOUNTER — Encounter: Payer: Self-pay | Admitting: Adult Health

## 2019-03-18 DIAGNOSIS — S72332S Displaced oblique fracture of shaft of left femur, sequela: Secondary | ICD-10-CM

## 2019-03-18 DIAGNOSIS — R69 Illness, unspecified: Secondary | ICD-10-CM | POA: Diagnosis not present

## 2019-03-18 DIAGNOSIS — F0150 Vascular dementia without behavioral disturbance: Secondary | ICD-10-CM

## 2019-03-18 DIAGNOSIS — R52 Pain, unspecified: Secondary | ICD-10-CM

## 2019-03-18 NOTE — Progress Notes (Signed)
Location:  [place] Room Number: 119-P Place of Service:  SNF (31)   CODE STATUS: DNR  Allergies  Allergen Reactions  . Sulfa Antibiotics Other (See Comments)    Reaction:  Unknown   . Aspirin Other (See Comments)    Reaction:  Burning of pts stomach     Chief Complaint  Patient presents with  . Medical Management of Chronic Issues       Chronic pain syndrome . Displaced oblique fracture of shaft of femur sequela: Dementia without behavioral disturbance:     HPI:  She is a 85 year old long term resident of this facility being seen for the management of her chronic illnesses: pain; femur fracture dementia. There are no reports of uncontrolled pain. Her appetite is stable along with her weight. There are no reports of agitation or insomnia.   Past Medical History:  Diagnosis Date  . Anemia   . Arthritis   . Cancer ([place])    uterine surg only  . CHF (congestive heart failure) ([place])   . Colitis 12/31/2012   presumed C.Diff. patient declined colonoscopy  . Dementia ([place])   . Encephalopathy   . Failure to thrive in adult   . Hypotension   . Kidney stone   . Muscle weakness (generalized)   . Osteoarthritis   . Pancreatic atrophy 12/31/2012  . Peptic ulcer   . Pneumonia   . Polyosteoarthritis   . Scoliosis   . Sick sinus syndrome ([place])   . Stroke [facility])     Past Surgical History:  Procedure Laterality Date  . ABDOMINAL HYSTERECTOMY    . CHOLECYSTECTOMY    . ESOPHAGOGASTRODUODENOSCOPY N/A 03/03/2013   RMR: Significant gastric ulcer without bleeding stigmata requiring no endoscopic intervention today. Noncritical Schatzki's ring. Small hiatal hernia  . ESOPHAGOGASTRODUODENOSCOPY N/A 08/24/2013   Dr. [name]: Hiatal hernia.  Gastric mucosal scar formation at site of previously noted gastric ulcerations  . INTRAMEDULLARY (IM) NAIL INTERTROCHANTERIC Left 01/10/2019   Procedure: INTRAMEDULLARY (IM) NAIL LEFT INTERTROCHANTRIC;  Surgeon: Leandrew Koyanagi, MD;   Location: [place];  Service: Orthopedics;  Laterality: Left;  . JOINT REPLACEMENT    . LEFT KNEE SURGERY AND REVISION    . RIGHT HIP SURGERY      Social History   Socioeconomic History  . Marital status: Widowed    Spouse name: Not on file  . Number of children: Not on file  . Years of education: Not on file  . Highest education level: Not on file  Occupational History  . Not on file  Social Needs  . Financial resource strain: Not on file  . Food insecurity    Worry: Not on file    Inability: Not on file  . Transportation needs    Medical: Not on file    Non-medical: Not on file  Tobacco Use  . Smoking status: Former Smoker    Types: Cigarettes    Quit date: 04/25/1951    Years since quitting: 67.9  . Smokeless tobacco: Never Used  Substance and Sexual Activity  . Alcohol use: No    Alcohol/week: 1.0 standard drinks    Types: 1 Glasses of wine per week  . Drug use: No  . Sexual activity: Never  Lifestyle  . Physical activity    Days per week: Not on file    Minutes per session: Not on file  . Stress: Not on file  Relationships  . Social Herbalist on phone:  Not on file    Gets together: Not on file    Attends religious service: Not on file    Active member of club or organization: Not on file    Attends meetings of clubs or organizations: Not on file    Relationship status: Not on file  . Intimate partner violence    Fear of current or ex partner: Not on file    Emotionally abused: Not on file    Physically abused: Not on file    Forced sexual activity: Not on file  Other Topics Concern  . Not on file  Social History Narrative   Former cigarette smoker.  Widowed.   Family History  Problem Relation Age of Onset  . Cancer Mother       VITAL SIGNS Ht 4\' 5"  (1.346 m)   Wt 71 lb 3.2 oz (32.3 kg)   BMI 17.82 kg/m   Outpatient Encounter Medications as of 03/18/2019  Medication Sig  . acetaminophen (TYLENOL) 500 MG tablet Take 500 mg by mouth  every 6 (six) hours as needed for mild pain, moderate pain, fever or headache.   . albuterol (PROAIR HFA) 108 (90 BASE) MCG/ACT inhaler Inhale 2 puffs into the lungs every 6 (six) hours as needed for wheezing or shortness of breath.   Marland Kitchen alum & mag hydroxide-simeth (MAALOX REGULAR STRENGTH) 200-200-20 MG/5ML suspension Take 15 mLs by mouth every 6 (six) hours as needed for indigestion or heartburn.  Roseanne Kaufman Peru-Castor Oil (VENELEX) OINT Apply 1 application topically See admin instructions. Apply topically to sacrum and bilateral buttocks every shift and as needed for blanchable erythema   . docusate sodium (COLACE) 100 MG capsule Take 100 mg by mouth daily.   . magnesium hydroxide (MILK OF MAGNESIA) 400 MG/5ML suspension Take 30 mLs by mouth daily as needed for mild constipation.  Marland Kitchen morphine (ROXANOL) 20 MG/ML concentrated solution Take 5 mg by mouth every 2 (two) hours as needed. Uncontrolled pain  . morphine (ROXANOL) 20 MG/ML concentrated solution Take 0.25 mLs (5 mg total) by mouth 2 (two) times daily. Uncontrolled pain  . NON FORMULARY Diet: Regular  . Nutritional Supplements (ENSURE ENLIVE PO) Take 1 Bottle by mouth 3 (three) times daily between meals.  . pantoprazole (PROTONIX) 40 MG tablet Take 40 mg by mouth every morning.   Vladimir Faster Glycol-Propyl Glycol (SYSTANE) 0.4-0.3 % SOLN Place 1 drop into both eyes 2 (two) times daily between meals.   . polyethylene glycol (MIRALAX / GLYCOLAX) packet Take 17 g by mouth daily.   . Wound Dressings (ALLEVYN ADHESIVE EX) Apply allevyn foam dressing topically to bilateral heels and change every 3 days and as needed for prevention  . [DISCONTINUED] morphine (ROXANOL) 20 MG/ML concentrated solution Take 5 mg by mouth every 2 (two) hours as needed (Uncontrolled pain).   No facility-administered encounter medications on file as of 03/18/2019.      SIGNIFICANT DIAGNOSTIC EXAMS  PREVIOUS:   07-10-18: chest x-ray: No acute abnormality noted.   07-10-18: ct of head: Chronic atrophic and ischemic changes without acute abnormality.  07-11-18: 2-d echo:  1. The left ventricle has normal systolic function with an ejection fraction of 60-65%. The cavity size was normal. Left ventricular diastolic Doppler parameters are indeterminate.  2. The right ventricle has normal systolic function. The cavity was normal. There is no increase in right ventricular wall thickness.  3. Left atrial size was moderately dilated.  4. The mitral valve is normal in structure. Mild thickening of the  mitral valve leaflet. There is mild mitral annular calcification present.  5. The tricuspid valve is normal in structure.  6. The aortic valve is tricuspid Mild thickening of the aortic valve.  7. The pulmonic valve was normal in structure.  8. Normal LV function; moderate LAE; mild MR and TR.  07-12-18: ct of head: 1. Atrophy and white matter microvascular disease not changed from prior. 2. No acute findings.  01-08-19: left hip fracture: Displaced and angulated oblique fracture of the proximal third of the left femoral diaphysis.  01-08-19: chest x-ray: No active disease. Severe scoliosis   01-08-19: left femur x-ray: Displaced and angulated oblique fracture of the proximal third of the left femoral diaphysis.  02/22/2019: bilateral hip and pelvic x-ray; Chronic changes of bilateral hips. No acute fracture or dislocation noted.  02-17-19: bilateral hip and pelvic x-ray: 1. Unchanged deformity of the right acetabulum. Intact total right hip arthroplasty. 2. No acute findings are evident.  02-17-19: right knee x-ray; Displaced distal femoral fracture with moderate lipohemarthrosis. No obvious intra-articular extension. CT could be considered for further characterization  NO NEW EXAMS.    LABS REVIEWED PREVIOUS:   07-10-18: wbc 6.3; hgb 12.3; hct 38.6; mcv 105.2; plt 219; glucose 108; bun 17 creat 0.67; k+ 3.8; na++ 142; ca 9.0 liver normal albumin 4.4 urine culture: no  growth urine drug screen neg 07-11-18: chol 178; ldl 98  trig 65 hdl 67; tsh 0.831; hgb a1c 5.5 bit B 12 204  07-13-18: glucose 65; bun 21; creat 0.76; k+ 3.2; an++ 139; ca 8.8 mag 1.8; phos 3.1 07-14-18: glucose 120; bun 11; creat 0.63; k+ 3.5; na++ 139  07-19-18: wbc 10.5; hgb 10.8; hct 32.9; mcv 99.4 plt 186; glucose bun 19; creat 0.53;  85; k+ 2.6; na++ 139 ca 8.4 07-26-18: wbc 14.2; hgb 12.3; hct 39.3; mcv 105.1; plt 405; glucose 96; bun 54; creat 0.84; k+ 4.0; na++ 148; ca 9.2  01-08-19: wbc 13.3; hgb 10.8; hct 35.0; mcv 100.9; plt 313; glucose 106; bun 22; creat 0.59; k+ 4.1; na++ 140; ca 8.3; liver normal albumin 3.1 01-12-19: wbc 13.5; hgb 7.9; hct 24.4; mcv 96.8; plt 267; glucose 92; bun 23; creat 0.78; k+ 3.1; na++ 142; ca 8.5  01-18-19: wbc 8.4; hgb 8.5; hct 27.2; mcv 103.4 plt 404; glucose 80; bun 22; creat 0.52; k+ 3.6 na++ 144; ca 8.2  02/14/2019: wbc 7.6; hgb 9.9; hct 32.7; mcv 107.2 ;plt 306; glucose 104; bun 20; creat 0.70; k+ 3.4; na++ 139; ca 8.5   NO NEW LABS.    Review of Systems  Unable to perform ROS: Dementia (unable to participate )   Physical Exam Constitutional:      General: She is not in acute distress.    Appearance: She is cachectic. She is not diaphoretic.  Neck:     Musculoskeletal: Neck supple.     Thyroid: No thyromegaly.  Cardiovascular:     Rate and Rhythm: Normal rate. Rhythm irregular.     Pulses: Normal pulses.     Heart sounds: Normal heart sounds.  Pulmonary:     Effort: Pulmonary effort is normal. No respiratory distress.     Breath sounds: Normal breath sounds.  Abdominal:     General: Bowel sounds are normal. There is no distension.     Palpations: Abdomen is soft.     Tenderness: There is no abdominal tenderness.  Musculoskeletal:     Right lower leg: No edema.     Left lower leg: No edema.  Comments: Is status post left femur fracture  Status post nail 01-09-19  Does not want to move right leg    Lymphadenopathy:     Cervical: No cervical  adenopathy.  Skin:    General: Skin is warm and dry.  Neurological:     Mental Status: She is alert. Mental status is at baseline.  Psychiatric:        Mood and Affect: Mood normal.       ASSESSMENT/ PLAN:  TODAY:   1. Chronic pain syndrome: her pain is presently managed: will continue roxanol 5 mg twice daily and every 2 hours as needed   2. Displaced oblique fracture of shaft of femur sequela: is stable will continue roxanol 5 mg twice daily as every 2 hours as needed for pain management.   3. Dementia without behavioral disturbance: no significant change in her status; her current weight is 71 pounds; will continue to monitor her status. Weight loss is an unfortunate but expected outcome at the late stages of this disease process.   PREVIOUS  4. Generalized anxiety disorder: is without change: is off xanax  5. Tachy/brady syndrome: is without change in status not a candidate for pace maker will monitor  6. Chronic diastolic congestive heart failure: is compensated will monitor   7. PUD (peptic ulcer disease) is stable will continue protonix 40 mg daily   8. Chronic constipation: is stable will continue colace and miralax daily   9. Protein calorie malnutrition severe/failure to thrive in adult: no significant change in status; weight 71 pounds will continue ensure three times daily   10. Macrocytic anemia: is without change hgb 9.9 will monitor the focus of her care is comfort.     MD is aware of resident's narcotic use and is in agreement with current plan of care. We will attempt to wean resident as appropriate.  Ok Edwards NP [facility]  Contact [phone] Monday through Friday 8am- 5pm  After hours call [phone]

## 2019-03-21 ENCOUNTER — Other Ambulatory Visit: Payer: Self-pay | Admitting: Adult Health

## 2019-03-21 MED ORDER — MORPHINE SULFATE (CONCENTRATE) 20 MG/ML PO SOLN: 5.0000 mg | Freq: Two times a day (BID) | ORAL | 0 refills | Status: DC

## 2019-03-21 MED ORDER — MORPHINE SULFATE (CONCENTRATE) 20 MG/ML PO SOLN: 5.0000 mg | ORAL | 0 refills | Status: DC | PRN

## 2019-03-23 ENCOUNTER — Other Ambulatory Visit: Payer: Self-pay | Admitting: Adult Health

## 2019-03-23 ENCOUNTER — Encounter: Payer: Self-pay | Admitting: Adult Health

## 2019-03-23 ENCOUNTER — Non-Acute Institutional Stay (SKILLED_NURSING_FACILITY): Payer: Medicare HMO | Admitting: Adult Health

## 2019-03-23 DIAGNOSIS — F411 Generalized anxiety disorder: Secondary | ICD-10-CM

## 2019-03-23 DIAGNOSIS — R69 Illness, unspecified: Secondary | ICD-10-CM | POA: Diagnosis not present

## 2019-03-23 MED ORDER — LORAZEPAM 2 MG/ML PO CONC: 0.5000 mg | ORAL | 0 refills | Status: DC | PRN

## 2019-03-23 NOTE — Progress Notes (Signed)
Location:  [place] Room Number: 119-P Place of Service:  SNF (31)   CODE STATUS: DNR  Allergies  Allergen Reactions  . Sulfa Antibiotics Other (See Comments)    Reaction:  Unknown   . Aspirin Other (See Comments)    Reaction:  Burning of pts stomach     Chief Complaint  Patient presents with  . Acute Visit    Patient is seen for anxiety    HPI:  Staff report that she is having periods of anxiety where she cannot be consoled. She was given roxanol due to her distress with little relief. She had not had any further falls. There are no reports of fevers.   Past Medical History:  Diagnosis Date  . Anemia   . Arthritis   . Cancer ([place])    uterine surg only  . CHF (congestive heart failure) ([place])   . Colitis 12/31/2012   presumed C.Diff. patient declined colonoscopy  . Dementia ([place])   . Encephalopathy   . Failure to thrive in adult   . Hypotension   . Kidney stone   . Muscle weakness (generalized)   . Osteoarthritis   . Pancreatic atrophy 12/31/2012  . Peptic ulcer   . Pneumonia   . Polyosteoarthritis   . Scoliosis   . Sick sinus syndrome ([place])   . Stroke [facility])     Past Surgical History:  Procedure Laterality Date  . ABDOMINAL HYSTERECTOMY    . CHOLECYSTECTOMY    . ESOPHAGOGASTRODUODENOSCOPY N/A 03/03/2013   RMR: Significant gastric ulcer without bleeding stigmata requiring no endoscopic intervention today. Noncritical Schatzki's ring. Small hiatal hernia  . ESOPHAGOGASTRODUODENOSCOPY N/A 08/24/2013   Dr. [name]: Hiatal hernia.  Gastric mucosal scar formation at site of previously noted gastric ulcerations  . INTRAMEDULLARY (IM) NAIL INTERTROCHANTERIC Left 01/10/2019   Procedure: INTRAMEDULLARY (IM) NAIL LEFT INTERTROCHANTRIC;  Surgeon: Leandrew Koyanagi, MD;  Location: [place];  Service: Orthopedics;  Laterality: Left;  . JOINT REPLACEMENT    . LEFT KNEE SURGERY AND REVISION    . RIGHT HIP SURGERY      Social History   Socioeconomic  History  . Marital status: Widowed    Spouse name: Not on file  . Number of children: Not on file  . Years of education: Not on file  . Highest education level: Not on file  Occupational History  . Not on file  Social Needs  . Financial resource strain: Not on file  . Food insecurity    Worry: Not on file    Inability: Not on file  . Transportation needs    Medical: Not on file    Non-medical: Not on file  Tobacco Use  . Smoking status: Former Smoker    Types: Cigarettes    Quit date: 04/25/1951    Years since quitting: 67.9  . Smokeless tobacco: Never Used  Substance and Sexual Activity  . Alcohol use: No    Alcohol/week: 1.0 standard drinks    Types: 1 Glasses of wine per week  . Drug use: No  . Sexual activity: Never  Lifestyle  . Physical activity    Days per week: Not on file    Minutes per session: Not on file  . Stress: Not on file  Relationships  . Social Herbalist on phone: Not on file    Gets together: Not on file    Attends religious service: Not on file    Active member of club or  organization: Not on file    Attends meetings of clubs or organizations: Not on file    Relationship status: Not on file  . Intimate partner violence    Fear of current or ex partner: Not on file    Emotionally abused: Not on file    Physically abused: Not on file    Forced sexual activity: Not on file  Other Topics Concern  . Not on file  Social History Narrative   Former cigarette smoker.  Widowed.   Family History  Problem Relation Age of Onset  . Cancer Mother       VITAL SIGNS Temp 98.8 F (37.1 C) (Oral)   Ht 4\' 5"  (1.346 m)   Wt 71 lb 3.2 oz (32.3 kg)   BMI 17.82 kg/m   Outpatient Encounter Medications as of 03/23/2019  Medication Sig  . acetaminophen (TYLENOL) 500 MG tablet Take 500 mg by mouth every 6 (six) hours as needed for mild pain, moderate pain, fever or headache.   . albuterol (PROAIR HFA) 108 (90 BASE) MCG/ACT inhaler Inhale 2  puffs into the lungs every 6 (six) hours as needed for wheezing or shortness of breath.   Marland Kitchen alum & mag hydroxide-simeth (MAALOX REGULAR STRENGTH) 200-200-20 MG/5ML suspension Take 15 mLs by mouth every 6 (six) hours as needed for indigestion or heartburn.  Roseanne Kaufman Peru-Castor Oil (VENELEX) OINT Apply 1 application topically See admin instructions. Apply topically to sacrum and bilateral buttocks every shift and as needed for blanchable erythema   . docusate sodium (COLACE) 100 MG capsule Take 100 mg by mouth daily.   Marland Kitchen LORazepam (ATIVAN) 2 MG/ML concentrated solution Take 0.3 mLs (0.6 mg total) by mouth every 4 (four) hours as needed for up to 14 days for anxiety.  . magnesium hydroxide (MILK OF MAGNESIA) 400 MG/5ML suspension Take 30 mLs by mouth daily as needed for mild constipation.  Marland Kitchen morphine (ROXANOL) 20 MG/ML concentrated solution Take 0.25 mLs (5 mg total) by mouth 2 (two) times daily. AND may have every 2 hours as needed for pain or distress  . NON FORMULARY Diet: Regular  . Nutritional Supplements (ENSURE ENLIVE PO) Take 1 Bottle by mouth 3 (three) times daily between meals.  . pantoprazole (PROTONIX) 40 MG tablet Take 40 mg by mouth every morning.   Vladimir Faster Glycol-Propyl Glycol (SYSTANE) 0.4-0.3 % SOLN Place 1 drop into both eyes 2 (two) times daily between meals.   . polyethylene glycol (MIRALAX / GLYCOLAX) packet Take 17 g by mouth daily.   . Wound Dressings (ALLEVYN ADHESIVE EX) Apply allevyn foam dressing topically to bilateral heels and change every 3 days and as needed for prevention   No facility-administered encounter medications on file as of 03/23/2019.      SIGNIFICANT DIAGNOSTIC EXAMS  PREVIOUS:   07-10-18: chest x-ray: No acute abnormality noted.  07-10-18: ct of head: Chronic atrophic and ischemic changes without acute abnormality.  07-11-18: 2-d echo:  1. The left ventricle has normal systolic function with an ejection fraction of 60-65%. The cavity size was  normal. Left ventricular diastolic Doppler parameters are indeterminate.  2. The right ventricle has normal systolic function. The cavity was normal. There is no increase in right ventricular wall thickness.  3. Left atrial size was moderately dilated.  4. The mitral valve is normal in structure. Mild thickening of the mitral valve leaflet. There is mild mitral annular calcification present.  5. The tricuspid valve is normal in structure.  6. The aortic valve  is tricuspid Mild thickening of the aortic valve.  7. The pulmonic valve was normal in structure.  8. Normal LV function; moderate LAE; mild MR and TR.  07-12-18: ct of head: 1. Atrophy and white matter microvascular disease not changed from prior. 2. No acute findings.  01-08-19: left hip fracture: Displaced and angulated oblique fracture of the proximal third of the left femoral diaphysis.  01-08-19: chest x-ray: No active disease. Severe scoliosis   01-08-19: left femur x-ray: Displaced and angulated oblique fracture of the proximal third of the left femoral diaphysis.  02/18/2019: bilateral hip and pelvic x-ray; Chronic changes of bilateral hips. No acute fracture or dislocation noted.  02-17-19: bilateral hip and pelvic x-ray: 1. Unchanged deformity of the right acetabulum. Intact total right hip arthroplasty. 2. No acute findings are evident.  02-17-19: right knee x-ray; Displaced distal femoral fracture with moderate lipohemarthrosis. No obvious intra-articular extension. CT could be considered for further characterization  NO NEW EXAMS.    LABS REVIEWED PREVIOUS:   07-10-18: wbc 6.3; hgb 12.3; hct 38.6; mcv 105.2; plt 219; glucose 108; bun 17 creat 0.67; k+ 3.8; na++ 142; ca 9.0 liver normal albumin 4.4 urine culture: no growth urine drug screen neg 07-11-18: chol 178; ldl 98  trig 65 hdl 67; tsh 0.831; hgb a1c 5.5 bit B 12 204  07-13-18: glucose 65; bun 21; creat 0.76; k+ 3.2; an++ 139; ca 8.8 mag 1.8; phos 3.1 07-14-18: glucose 120;  bun 11; creat 0.63; k+ 3.5; na++ 139  07-19-18: wbc 10.5; hgb 10.8; hct 32.9; mcv 99.4 plt 186; glucose bun 19; creat 0.53;  85; k+ 2.6; na++ 139 ca 8.4 07-26-18: wbc 14.2; hgb 12.3; hct 39.3; mcv 105.1; plt 405; glucose 96; bun 54; creat 0.84; k+ 4.0; na++ 148; ca 9.2  01-08-19: wbc 13.3; hgb 10.8; hct 35.0; mcv 100.9; plt 313; glucose 106; bun 22; creat 0.59; k+ 4.1; na++ 140; ca 8.3; liver normal albumin 3.1 01-12-19: wbc 13.5; hgb 7.9; hct 24.4; mcv 96.8; plt 267; glucose 92; bun 23; creat 0.78; k+ 3.1; na++ 142; ca 8.5  01-18-19: wbc 8.4; hgb 8.5; hct 27.2; mcv 103.4 plt 404; glucose 80; bun 22; creat 0.52; k+ 3.6 na++ 144; ca 8.2  02/24/2019: wbc 7.6; hgb 9.9; hct 32.7; mcv 107.2 ;plt 306; glucose 104; bun 20; creat 0.70; k+ 3.4; na++ 139; ca 8.5   NO NEW LABS.   Review of Systems  Unable to perform ROS: Dementia (unable to participate )   Physical Exam Constitutional:      General: She is not in acute distress.    Appearance: She is cachectic. She is not diaphoretic.  Neck:     Musculoskeletal: Neck supple.     Thyroid: No thyromegaly.  Cardiovascular:     Rate and Rhythm: Normal rate. Rhythm irregular.     Pulses: Normal pulses.     Heart sounds: Normal heart sounds.  Pulmonary:     Effort: Pulmonary effort is normal. No respiratory distress.     Breath sounds: Normal breath sounds.  Abdominal:     General: Bowel sounds are normal. There is no distension.     Palpations: Abdomen is soft.     Tenderness: There is no abdominal tenderness.  Musculoskeletal:     Right lower leg: No edema.     Left lower leg: No edema.     Comments:  Is status post left femur fracture  Status post nail 01-09-19  Is able to move all extremities  Lymphadenopathy:     Cervical: No cervical adenopathy.  Skin:    General: Skin is warm and dry.  Neurological:     Mental Status: She is alert. Mental status is at baseline.  Psychiatric:        Mood and Affect: Mood normal.      ASSESSMENT/ PLAN:   TODAY  1. Generalized anxiety disorder: is worse will begin ativan 0.5 mg every 4 hours as needed through 04-06-19  Will monitor her status      MD is aware of resident's narcotic use and is in agreement with current plan of care. We will attempt to wean resident as appropriate.  Ok Edwards NP [facility]  Contact [phone] Monday through Friday 8am- 5pm  After hours call [phone]

## 2019-04-06 ENCOUNTER — Other Ambulatory Visit: Payer: Self-pay | Admitting: Adult Health

## 2019-04-06 MED ORDER — MORPHINE SULFATE (CONCENTRATE) 20 MG/ML PO SOLN: 5.0000 mg | Freq: Two times a day (BID) | ORAL | 0 refills | Status: DC

## 2019-04-06 MED ORDER — LORAZEPAM 2 MG/ML PO CONC: 0.5000 mg | ORAL | 0 refills | Status: AC | PRN

## 2019-04-17 ENCOUNTER — Other Ambulatory Visit (HOSPITAL_COMMUNITY)
Admission: RE | Admit: 2019-04-17 | Discharge: 2019-04-17 | Disposition: A | Discharge status: Home / Self Care | Payer: Medicare HMO | Source: Ambulatory Visit | Attending: Internal Medicine | Admitting: Internal Medicine

## 2019-04-17 ENCOUNTER — Other Ambulatory Visit: Payer: Self-pay | Admitting: Internal Medicine

## 2019-04-17 DIAGNOSIS — Z20828 Contact with and (suspected) exposure to other viral communicable diseases: Secondary | ICD-10-CM | POA: Insufficient documentation

## 2019-04-17 DIAGNOSIS — Z9189 Other specified personal risk factors, not elsewhere classified: Secondary | ICD-10-CM

## 2019-04-20 ENCOUNTER — Other Ambulatory Visit (HOSPITAL_COMMUNITY)
Admission: RE | Admit: 2019-04-20 | Discharge: 2019-04-20 | Disposition: A | Discharge status: Home / Self Care | Payer: Medicare HMO | Source: Skilled Nursing Facility | Attending: Internal Medicine | Admitting: Internal Medicine

## 2019-04-20 DIAGNOSIS — Z20828 Contact with and (suspected) exposure to other viral communicable diseases: Secondary | ICD-10-CM | POA: Insufficient documentation

## 2019-04-20 DIAGNOSIS — R509 Fever, unspecified: Secondary | ICD-10-CM | POA: Insufficient documentation

## 2019-04-20 LAB — RESPIRATORY PANEL BY RT PCR (FLU A&B, COVID)
Influenza A by PCR: NEGATIVE
Influenza B by PCR: NEGATIVE
SARS Coronavirus 2 by RT PCR: NEGATIVE

## 2019-04-20 LAB — SARS CORONAVIRUS 2 (TAT 6-24 HRS): SARS Coronavirus 2: NEGATIVE

## 2019-04-21 ENCOUNTER — Encounter: Payer: Self-pay | Admitting: Adult Health

## 2019-04-21 ENCOUNTER — Non-Acute Institutional Stay (SKILLED_NURSING_FACILITY): Payer: Medicare HMO | Admitting: Adult Health

## 2019-04-21 DIAGNOSIS — K279 Peptic ulcer, site unspecified, unspecified as acute or chronic, without hemorrhage or perforation: Secondary | ICD-10-CM

## 2019-04-21 DIAGNOSIS — I495 Sick sinus syndrome: Secondary | ICD-10-CM

## 2019-04-21 DIAGNOSIS — I5032 Chronic diastolic (congestive) heart failure: Secondary | ICD-10-CM | POA: Diagnosis not present

## 2019-04-21 NOTE — Progress Notes (Signed)
Location:    [place] Room Number: 119/P Place of Service:  SNF (31)   CODE STATUS: DNR  Allergies  Allergen Reactions  . Sulfa Antibiotics Other (See Comments)    Reaction:  Unknown   . Aspirin Other (See Comments)    Reaction:  Burning of pts stomach     Chief Complaint  Patient presents with  . Medical Management of Chronic Issues        Tachy/brady syndrome:   Chronic diastolic congestive heart failure will monitor  . PUD (peptic ulcer disease)     HPI:  She is a 85 year old long term resident of this facility being seen for the management of her chronic illnesses: tachy/brady syndrome; chf; pud. There are no reports of agitation; no reports of uncontrolled pain; no reports of constipation.   Past Medical History:  Diagnosis Date  . Anemia   . Arthritis   . Cancer ([place])    uterine surg only  . CHF (congestive heart failure) ([place])   . Colitis 12/31/2012   presumed C.Diff. patient declined colonoscopy  . Dementia ([place])   . Encephalopathy   . Failure to thrive in adult   . Hypotension   . Kidney stone   . Muscle weakness (generalized)   . Osteoarthritis   . Pancreatic atrophy 12/31/2012  . Peptic ulcer   . Pneumonia   . Polyosteoarthritis   . Scoliosis   . Sick sinus syndrome ([place])   . Stroke [facility])     Past Surgical History:  Procedure Laterality Date  . ABDOMINAL HYSTERECTOMY    . CHOLECYSTECTOMY    . ESOPHAGOGASTRODUODENOSCOPY N/A 03/03/2013   RMR: Significant gastric ulcer without bleeding stigmata requiring no endoscopic intervention today. Noncritical Schatzki's ring. Small hiatal hernia  . ESOPHAGOGASTRODUODENOSCOPY N/A 08/24/2013   Dr. [name]: Hiatal hernia.  Gastric mucosal scar formation at site of previously noted gastric ulcerations  . INTRAMEDULLARY (IM) NAIL INTERTROCHANTERIC Left 01/10/2019   Procedure: INTRAMEDULLARY (IM) NAIL LEFT INTERTROCHANTRIC;  Surgeon: Leandrew Koyanagi, MD;  Location: [place];  Service: Orthopedics;   Laterality: Left;  . JOINT REPLACEMENT    . LEFT KNEE SURGERY AND REVISION    . RIGHT HIP SURGERY      Social History   Socioeconomic History  . Marital status: Widowed    Spouse name: Not on file  . Number of children: Not on file  . Years of education: Not on file  . Highest education level: Not on file  Occupational History  . Not on file  Tobacco Use  . Smoking status: Former Smoker    Types: Cigarettes    Quit date: 04/25/1951    Years since quitting: 68.0  . Smokeless tobacco: Never Used  Substance and Sexual Activity  . Alcohol use: No    Alcohol/week: 1.0 standard drinks    Types: 1 Glasses of wine per week  . Drug use: No  . Sexual activity: Never  Other Topics Concern  . Not on file  Social History Narrative   Former cigarette smoker.  Widowed.   Social Determinants of Health   Financial Resource Strain:   . Difficulty of Paying Living Expenses: Not on file  Food Insecurity:   . Worried About Charity fundraiser in the Last Year: Not on file  . Ran Out of Food in the Last Year: Not on file  Transportation Needs:   . Lack of Transportation (Medical): Not on file  . Lack of Transportation (Non-Medical):  Not on file  Physical Activity:   . Days of Exercise per Week: Not on file  . Minutes of Exercise per Session: Not on file  Stress:   . Feeling of Stress : Not on file  Social Connections:   . Frequency of Communication with Friends and Family: Not on file  . Frequency of Social Gatherings with Friends and Family: Not on file  . Attends Religious Services: Not on file  . Active Member of Clubs or Organizations: Not on file  . Attends Archivist Meetings: Not on file  . Marital Status: Not on file  Intimate Partner Violence:   . Fear of Current or Ex-Partner: Not on file  . Emotionally Abused: Not on file  . Physically Abused: Not on file  . Sexually Abused: Not on file   Family History  Problem Relation Age of Onset  . Cancer Mother        VITAL SIGNS BP 105/65   Pulse 75   Temp 98.4 F (36.9 C) (Oral)   Resp 20   Ht 4\' 5"  (1.346 m)   Wt 71 lb 3.2 oz (32.3 kg)   SpO2 93%   BMI 17.82 kg/m   Outpatient Encounter Medications as of 04/21/2019  Medication Sig  . acetaminophen (TYLENOL) 500 MG tablet Take 500 mg by mouth every 6 (six) hours as needed for mild pain, moderate pain, fever or headache.   . albuterol (PROAIR HFA) 108 (90 BASE) MCG/ACT inhaler Inhale 2 puffs into the lungs every 6 (six) hours as needed for wheezing or shortness of breath.   Marland Kitchen alum & mag hydroxide-simeth (MAALOX REGULAR STRENGTH) 200-200-20 MG/5ML suspension Take 15 mLs by mouth every 6 (six) hours as needed for indigestion or heartburn.  Roseanne Kaufman Peru-Castor Oil (VENELEX) OINT Apply 1 application topically See admin instructions. Apply topically to sacrum and bilateral buttocks every shift and as needed for blanchable erythema   . Cholecalciferol 1.25 MG (50000 UT) capsule Take 50,000 Units by mouth. Once a on Thursday  . docusate sodium (COLACE) 100 MG capsule Take 100 mg by mouth daily.   . magnesium hydroxide (MILK OF MAGNESIA) 400 MG/5ML suspension Take 30 mLs by mouth daily as needed for mild constipation.  Marland Kitchen morphine (ROXANOL) 20 MG/ML concentrated solution Take 0.25 mLs (5 mg total) by mouth 2 (two) times daily. AND may have every 2 hours as needed for pain or distress  . morphine (ROXANOL) 20 MG/ML concentrated solution Take 5 mg by mouth every 2 (two) hours as needed for severe pain. For uncontrolled pain  . NON FORMULARY Diet: Regular  . Nutritional Supplements (ENSURE ENLIVE PO) Take 1 Bottle by mouth 3 (three) times daily between meals.  . pantoprazole (PROTONIX) 40 MG tablet Take 40 mg by mouth every morning.   Vladimir Faster Glycol-Propyl Glycol (SYSTANE) 0.4-0.3 % SOLN Place 1 drop into both eyes 2 (two) times daily between meals.   . polyethylene glycol (MIRALAX / GLYCOLAX) packet Take 17 g by mouth daily.   . vitamin C (ASCORBIC  ACID) 500 MG tablet Take 500 mg by mouth daily.  . Wound Dressings (ALLEVYN ADHESIVE EX) Apply allevyn foam dressing topically to bilateral heels and change every 3 days and as needed for prevention  . zinc sulfate 220 (50 Zn) MG capsule Take 220 mg by mouth daily.   No facility-administered encounter medications on file as of 04/21/2019.     SIGNIFICANT DIAGNOSTIC EXAMS   PREVIOUS:   07-10-18: chest x-ray: No acute  abnormality noted.  07-10-18: ct of head: Chronic atrophic and ischemic changes without acute abnormality.  07-11-18: 2-d echo:  1. The left ventricle has normal systolic function with an ejection fraction of 60-65%. The cavity size was normal. Left ventricular diastolic Doppler parameters are indeterminate.  2. The right ventricle has normal systolic function. The cavity was normal. There is no increase in right ventricular wall thickness.  3. Left atrial size was moderately dilated.  4. The mitral valve is normal in structure. Mild thickening of the mitral valve leaflet. There is mild mitral annular calcification present.  5. The tricuspid valve is normal in structure.  6. The aortic valve is tricuspid Mild thickening of the aortic valve.  7. The pulmonic valve was normal in structure.  8. Normal LV function; moderate LAE; mild MR and TR.  07-12-18: ct of head: 1. Atrophy and white matter microvascular disease not changed from prior. 2. No acute findings.  01-08-19: left hip fracture: Displaced and angulated oblique fracture of the proximal third of the left femoral diaphysis.  01-08-19: chest x-ray: No active disease. Severe scoliosis   01-08-19: left femur x-ray: Displaced and angulated oblique fracture of the proximal third of the left femoral diaphysis.  03/01/2019: bilateral hip and pelvic x-ray; Chronic changes of bilateral hips. No acute fracture or dislocation noted.  02-17-19: bilateral hip and pelvic x-ray: 1. Unchanged deformity of the right acetabulum. Intact total  right hip arthroplasty. 2. No acute findings are evident.  02-17-19: right knee x-ray; Displaced distal femoral fracture with moderate lipohemarthrosis. No obvious intra-articular extension. CT could be considered for further characterization  NO NEW EXAMS.    LABS REVIEWED PREVIOUS:   07-10-18: wbc 6.3; hgb 12.3; hct 38.6; mcv 105.2; plt 219; glucose 108; bun 17 creat 0.67; k+ 3.8; na++ 142; ca 9.0 liver normal albumin 4.4 urine culture: no growth urine drug screen neg 07-11-18: chol 178; ldl 98  trig 65 hdl 67; tsh 0.831; hgb a1c 5.5 bit B 12 204  07-13-18: glucose 65; bun 21; creat 0.76; k+ 3.2; an++ 139; ca 8.8 mag 1.8; phos 3.1 07-14-18: glucose 120; bun 11; creat 0.63; k+ 3.5; na++ 139  07-19-18: wbc 10.5; hgb 10.8; hct 32.9; mcv 99.4 plt 186; glucose bun 19; creat 0.53;  85; k+ 2.6; na++ 139 ca 8.4 07-26-18: wbc 14.2; hgb 12.3; hct 39.3; mcv 105.1; plt 405; glucose 96; bun 54; creat 0.84; k+ 4.0; na++ 148; ca 9.2  01-08-19: wbc 13.3; hgb 10.8; hct 35.0; mcv 100.9; plt 313; glucose 106; bun 22; creat 0.59; k+ 4.1; na++ 140; ca 8.3; liver normal albumin 3.1 01-12-19: wbc 13.5; hgb 7.9; hct 24.4; mcv 96.8; plt 267; glucose 92; bun 23; creat 0.78; k+ 3.1; na++ 142; ca 8.5  01-18-19: wbc 8.4; hgb 8.5; hct 27.2; mcv 103.4 plt 404; glucose 80; bun 22; creat 0.52; k+ 3.6 na++ 144; ca 8.2  02/28/2019: wbc 7.6; hgb 9.9; hct 32.7; mcv 107.2 ;plt 306; glucose 104; bun 20; creat 0.70; k+ 3.4; na++ 139; ca 8.5   NO NEW LABS.   Review of Systems  Unable to perform ROS: Dementia (unable to participate )   Physical Exam Constitutional:      General: She is not in acute distress.    Appearance: She is cachectic. She is not diaphoretic.  Neck:     Thyroid: No thyromegaly.  Cardiovascular:     Rate and Rhythm: Normal rate. Rhythm irregular.     Pulses: Normal pulses.     Heart  sounds: Normal heart sounds.  Pulmonary:     Effort: Pulmonary effort is normal. No respiratory distress.     Breath sounds: Normal  breath sounds.  Abdominal:     General: Bowel sounds are normal. There is no distension.     Palpations: Abdomen is soft.     Tenderness: There is no abdominal tenderness.  Musculoskeletal:     Cervical back: Neck supple.     Right lower leg: No edema.     Left lower leg: No edema.     Comments: Is status post left femur fracture  Status post nail 01-09-19  Is able to move all extremities    Lymphadenopathy:     Cervical: No cervical adenopathy.  Skin:    General: Skin is warm and dry.  Neurological:     Mental Status: She is alert. Mental status is at baseline.  Psychiatric:        Mood and Affect: Mood normal.      ASSESSMENT/ PLAN:  TODAY:   1. Tachy/brady syndrome: no change in status not a candidate for pace maker will monitor   2. Chronic diastolic congestive heart failure will monitor   3. PUD (peptic ulcer disease) is stable will continue protonix 40 mg daily   PREVIOUS  4. Generalized anxiety disorder: is without change: is off xanax  5. Chronic constipation: is stable will continue colace and miralax daily   6. Protein calorie malnutrition severe/failure to thrive in adult: no significant change in status; weight 71 pounds will continue ensure three times daily   7. Macrocytic anemia: is without change hgb 9.9 will monitor the focus of her care is comfort.   8. Chronic pain syndrome: her pain is presently managed: will continue roxanol 5 mg twice daily and every 2 hours as needed   9. Displaced oblique fracture of shaft of femur sequela: is stable will continue roxanol 5 mg twice daily as every 2 hours as needed for pain management.   10. Dementia without behavioral disturbance: no significant change in her status; her current weight is 71 pounds; will continue to monitor her status. Weight loss is an unfortunate but expected outcome at the late stages of this disease process.       MD is aware of resident's narcotic use and is in agreement with current  plan of care. We will attempt to wean resident as appropriate.  Ok Edwards NP [facility]  Contact [phone] Monday through Friday 8am- 5pm  After hours call [phone]

## 2019-04-23 ENCOUNTER — Other Ambulatory Visit: Payer: Self-pay | Admitting: Internal Medicine

## 2019-04-23 ENCOUNTER — Other Ambulatory Visit (HOSPITAL_COMMUNITY)
Admission: RE | Admit: 2019-04-23 | Discharge: 2019-04-23 | Disposition: A | Discharge status: Home / Self Care | Payer: Medicare HMO | Source: Ambulatory Visit | Attending: Internal Medicine | Admitting: Internal Medicine

## 2019-04-23 DIAGNOSIS — Z9189 Other specified personal risk factors, not elsewhere classified: Secondary | ICD-10-CM

## 2019-04-23 DIAGNOSIS — Z20828 Contact with and (suspected) exposure to other viral communicable diseases: Secondary | ICD-10-CM | POA: Diagnosis not present

## 2019-04-23 LAB — SARS CORONAVIRUS 2 (TAT 6-24 HRS): SARS Coronavirus 2: NEGATIVE

## 2019-04-27 ENCOUNTER — Other Ambulatory Visit (HOSPITAL_COMMUNITY)
Admission: RE | Admit: 2019-04-27 | Discharge: 2019-04-27 | Disposition: A | Discharge status: Home / Self Care | Payer: Medicare HMO | Source: Ambulatory Visit | Attending: Internal Medicine | Admitting: Internal Medicine

## 2019-04-27 DIAGNOSIS — Z20828 Contact with and (suspected) exposure to other viral communicable diseases: Secondary | ICD-10-CM | POA: Insufficient documentation

## 2019-04-29 LAB — NOVEL CORONAVIRUS, NAA (HOSP ORDER, SEND-OUT TO REF LAB; TAT 18-24 HRS): SARS-CoV-2, NAA: NOT DETECTED

## 2019-05-02 ENCOUNTER — Other Ambulatory Visit: Payer: Self-pay | Admitting: Adult Health

## 2019-05-02 ENCOUNTER — Other Ambulatory Visit (HOSPITAL_COMMUNITY)
Admission: RE | Admit: 2019-05-02 | Discharge: 2019-05-02 | Disposition: A | Discharge status: Home / Self Care | Payer: Medicare HMO | Source: Ambulatory Visit | Attending: Internal Medicine | Admitting: Internal Medicine

## 2019-05-02 DIAGNOSIS — Z20828 Contact with and (suspected) exposure to other viral communicable diseases: Secondary | ICD-10-CM | POA: Insufficient documentation

## 2019-05-02 MED ORDER — MORPHINE SULFATE (CONCENTRATE) 20 MG/ML PO SOLN: 5.0000 mg | Freq: Two times a day (BID) | ORAL | 0 refills | Status: AC

## 2019-05-03 ENCOUNTER — Encounter: Payer: Self-pay | Admitting: Adult Health

## 2019-05-03 ENCOUNTER — Non-Acute Institutional Stay (SKILLED_NURSING_FACILITY): Payer: Medicare HMO | Admitting: Adult Health

## 2019-05-03 DIAGNOSIS — F0150 Vascular dementia without behavioral disturbance: Secondary | ICD-10-CM | POA: Diagnosis not present

## 2019-05-03 DIAGNOSIS — I5032 Chronic diastolic (congestive) heart failure: Secondary | ICD-10-CM

## 2019-05-03 DIAGNOSIS — R69 Illness, unspecified: Secondary | ICD-10-CM | POA: Diagnosis not present

## 2019-05-03 NOTE — Progress Notes (Signed)
Location:  [place] Room Number: 119-P Place of Service:  SNF (31)   CODE STATUS:  DNR  Allergies  Allergen Reactions  . Sulfa Antibiotics Other (See Comments)    Reaction:  Unknown   . Aspirin Other (See Comments)    Reaction:  Burning of pts stomach     Chief Complaint  Patient presents with  . Acute Visit    Patient is seen for COIVID vaccination    HPI:  We have the mederna vaccine. Her risk factors include: long term resident of snf; advanced age; chf and dementia. I have spoken with her family regarding the 2 injection process; effectiveness; side effects; and possible adverse reactions. There are no reports of uncontrolled pain;no changes in appetite; no reports of agitation or anxiety.    Past Medical History:  Diagnosis Date  . Anemia   . Arthritis   . Cancer ([place])    uterine surg only  . CHF (congestive heart failure) ([place])   . Colitis 12/31/2012   presumed C.Diff. patient declined colonoscopy  . Dementia ([place])   . Encephalopathy   . Failure to thrive in adult   . Hypotension   . Kidney stone   . Muscle weakness (generalized)   . Osteoarthritis   . Pancreatic atrophy 12/31/2012  . Peptic ulcer   . Pneumonia   . Polyosteoarthritis   . Scoliosis   . Sick sinus syndrome ([place])   . Stroke [facility])     Past Surgical History:  Procedure Laterality Date  . ABDOMINAL HYSTERECTOMY    . CHOLECYSTECTOMY    . ESOPHAGOGASTRODUODENOSCOPY N/A 03/03/2013   RMR: Significant gastric ulcer without bleeding stigmata requiring no endoscopic intervention today. Noncritical Schatzki's ring. Small hiatal hernia  . ESOPHAGOGASTRODUODENOSCOPY N/A 08/24/2013   Dr. [name]: Hiatal hernia.  Gastric mucosal scar formation at site of previously noted gastric ulcerations  . INTRAMEDULLARY (IM) NAIL INTERTROCHANTERIC Left 01/10/2019   Procedure: INTRAMEDULLARY (IM) NAIL LEFT INTERTROCHANTRIC;  Surgeon: Leandrew Koyanagi, MD;  Location: [place];  Service: Orthopedics;   Laterality: Left;  . JOINT REPLACEMENT    . LEFT KNEE SURGERY AND REVISION    . RIGHT HIP SURGERY      Social History   Socioeconomic History  . Marital status: Widowed    Spouse name: Not on file  . Number of children: Not on file  . Years of education: Not on file  . Highest education level: Not on file  Occupational History  . Not on file  Tobacco Use  . Smoking status: Former Smoker    Types: Cigarettes    Quit date: 04/25/1951    Years since quitting: 68.0  . Smokeless tobacco: Never Used  Substance and Sexual Activity  . Alcohol use: No    Alcohol/week: 1.0 standard drinks    Types: 1 Glasses of wine per week  . Drug use: No  . Sexual activity: Never  Other Topics Concern  . Not on file  Social History Narrative   Former cigarette smoker.  Widowed.   Social Determinants of Health   Financial Resource Strain:   . Difficulty of Paying Living Expenses: Not on file  Food Insecurity:   . Worried About Charity fundraiser in the Last Year: Not on file  . Ran Out of Food in the Last Year: Not on file  Transportation Needs:   . Lack of Transportation (Medical): Not on file  . Lack of Transportation (Non-Medical): Not on file  Physical Activity:   .  Days of Exercise per Week: Not on file  . Minutes of Exercise per Session: Not on file  Stress:   . Feeling of Stress : Not on file  Social Connections:   . Frequency of Communication with Friends and Family: Not on file  . Frequency of Social Gatherings with Friends and Family: Not on file  . Attends Religious Services: Not on file  . Active Member of Clubs or Organizations: Not on file  . Attends Archivist Meetings: Not on file  . Marital Status: Not on file  Intimate Partner Violence:   . Fear of Current or Ex-Partner: Not on file  . Emotionally Abused: Not on file  . Physically Abused: Not on file  . Sexually Abused: Not on file   Family History  Problem Relation Age of Onset  . Cancer Mother        VITAL SIGNS BP (!) 89/46   Pulse 80   Temp 98.2 F (36.8 C) (Oral)   Resp 20   Ht 4\' 5"  (1.346 m)   Wt 71 lb 3.2 oz (32.3 kg)   SpO2 93%   BMI 17.82 kg/m   Outpatient Encounter Medications as of 05/03/2019  Medication Sig  . acetaminophen (TYLENOL) 500 MG tablet Take 500 mg by mouth every 6 (six) hours as needed for mild pain, moderate pain, fever or headache.   . albuterol (PROAIR HFA) 108 (90 BASE) MCG/ACT inhaler Inhale 2 puffs into the lungs every 6 (six) hours as needed for wheezing or shortness of breath.   Marland Kitchen alum & mag hydroxide-simeth (MAALOX REGULAR STRENGTH) 200-200-20 MG/5ML suspension Take 15 mLs by mouth every 6 (six) hours as needed for indigestion or heartburn.  Roseanne Kaufman Peru-Castor Oil (VENELEX) OINT Apply 1 application topically See admin instructions. Apply topically to sacrum and bilateral buttocks every shift and as needed for blanchable erythema   . docusate sodium (COLACE) 100 MG capsule Take 100 mg by mouth daily.   . magnesium hydroxide (MILK OF MAGNESIA) 400 MG/5ML suspension Take 30 mLs by mouth daily as needed for mild constipation.  Marland Kitchen morphine (ROXANOL) 20 MG/ML concentrated solution Take 5 mg by mouth every 2 (two) hours as needed for severe pain. For uncontrolled pain  . morphine (ROXANOL) 20 MG/ML concentrated solution Take 0.25 mLs (5 mg total) by mouth 2 (two) times daily. AND may have every 2 hours as needed for pain or distress  . NON FORMULARY Diet: Regular  . Nutritional Supplements (ENSURE ENLIVE PO) Take 1 Bottle by mouth 3 (three) times daily between meals.  . pantoprazole (PROTONIX) 40 MG tablet Take 40 mg by mouth every morning.   Vladimir Faster Glycol-Propyl Glycol (SYSTANE) 0.4-0.3 % SOLN Place 1 drop into both eyes 2 (two) times daily between meals.   . polyethylene glycol (MIRALAX / GLYCOLAX) packet Take 17 g by mouth daily.   . Wound Dressings (ALLEVYN ADHESIVE EX) Apply allevyn foam dressing topically to bilateral heels and change  every 3 days and as needed for prevention  . [DISCONTINUED] vitamin C (ASCORBIC ACID) 500 MG tablet Take 500 mg by mouth daily.  . [DISCONTINUED] zinc sulfate 220 (50 Zn) MG capsule Take 220 mg by mouth daily.   No facility-administered encounter medications on file as of 05/03/2019.     SIGNIFICANT DIAGNOSTIC EXAMS   PREVIOUS:   07-10-18: chest x-ray: No acute abnormality noted.  07-10-18: ct of head: Chronic atrophic and ischemic changes without acute abnormality.  07-11-18: 2-d echo:  1. The left  ventricle has normal systolic function with an ejection fraction of 60-65%. The cavity size was normal. Left ventricular diastolic Doppler parameters are indeterminate.  2. The right ventricle has normal systolic function. The cavity was normal. There is no increase in right ventricular wall thickness.  3. Left atrial size was moderately dilated.  4. The mitral valve is normal in structure. Mild thickening of the mitral valve leaflet. There is mild mitral annular calcification present.  5. The tricuspid valve is normal in structure.  6. The aortic valve is tricuspid Mild thickening of the aortic valve.  7. The pulmonic valve was normal in structure.  8. Normal LV function; moderate LAE; mild MR and TR.  07-12-18: ct of head: 1. Atrophy and white matter microvascular disease not changed from prior. 2. No acute findings.  01-08-19: left hip fracture: Displaced and angulated oblique fracture of the proximal third of the left femoral diaphysis.  01-08-19: chest x-ray: No active disease. Severe scoliosis   01-08-19: left femur x-ray: Displaced and angulated oblique fracture of the proximal third of the left femoral diaphysis.  03/07/2019: bilateral hip and pelvic x-ray; Chronic changes of bilateral hips. No acute fracture or dislocation noted.  02-17-19: bilateral hip and pelvic x-ray: 1. Unchanged deformity of the right acetabulum. Intact total right hip arthroplasty. 2. No acute findings are  evident.  02-17-19: right knee x-ray; Displaced distal femoral fracture with moderate lipohemarthrosis. No obvious intra-articular extension. CT could be considered for further characterization  NO NEW EXAMS.    LABS REVIEWED PREVIOUS:   07-10-18: wbc 6.3; hgb 12.3; hct 38.6; mcv 105.2; plt 219; glucose 108; bun 17 creat 0.67; k+ 3.8; na++ 142; ca 9.0 liver normal albumin 4.4 urine culture: no growth urine drug screen neg 07-11-18: chol 178; ldl 98  trig 65 hdl 67; tsh 0.831; hgb a1c 5.5 bit B 12 204  07-13-18: glucose 65; bun 21; creat 0.76; k+ 3.2; an++ 139; ca 8.8 mag 1.8; phos 3.1 07-14-18: glucose 120; bun 11; creat 0.63; k+ 3.5; na++ 139  07-19-18: wbc 10.5; hgb 10.8; hct 32.9; mcv 99.4 plt 186; glucose bun 19; creat 0.53;  85; k+ 2.6; na++ 139 ca 8.4 07-26-18: wbc 14.2; hgb 12.3; hct 39.3; mcv 105.1; plt 405; glucose 96; bun 54; creat 0.84; k+ 4.0; na++ 148; ca 9.2  01-08-19: wbc 13.3; hgb 10.8; hct 35.0; mcv 100.9; plt 313; glucose 106; bun 22; creat 0.59; k+ 4.1; na++ 140; ca 8.3; liver normal albumin 3.1 01-12-19: wbc 13.5; hgb 7.9; hct 24.4; mcv 96.8; plt 267; glucose 92; bun 23; creat 0.78; k+ 3.1; na++ 142; ca 8.5  01-18-19: wbc 8.4; hgb 8.5; hct 27.2; mcv 103.4 plt 404; glucose 80; bun 22; creat 0.52; k+ 3.6 na++ 144; ca 8.2  03/12/2019: wbc 7.6; hgb 9.9; hct 32.7; mcv 107.2 ;plt 306; glucose 104; bun 20; creat 0.70; k+ 3.4; na++ 139; ca 8.5   NO NEW LABS.   Review of Systems  Unable to perform ROS: Dementia (unable to participate )   Physical Exam Constitutional:      General: She is not in acute distress.    Appearance: She is cachectic. She is not diaphoretic.  Neck:     Thyroid: No thyromegaly.  Cardiovascular:     Rate and Rhythm: Normal rate. Rhythm irregular.     Pulses: Normal pulses.     Heart sounds: Normal heart sounds.  Pulmonary:     Effort: Pulmonary effort is normal. No respiratory distress.     Breath  sounds: Normal breath sounds.  Abdominal:     General: Bowel  sounds are normal. There is no distension.     Palpations: Abdomen is soft.     Tenderness: There is no abdominal tenderness.  Musculoskeletal:     Cervical back: Neck supple.     Right lower leg: No edema.     Left lower leg: No edema.     Comments:  Is status post left femur fracture  Status post nail 01-09-19  Is able to move all extremities     Lymphadenopathy:     Cervical: No cervical adenopathy.  Skin:    General: Skin is warm and dry.  Neurological:     Mental Status: She is alert. Mental status is at baseline.  Psychiatric:        Mood and Affect: Mood normal.     ASSESSMENT/ PLAN:  TODAY;   1. Long term resident of snf 2. Advanced age 10. Chronic diastolic chf 4. Vascular dementia without behavioral disturbance  Will setup for the first injection on 05-10-19.      MD is aware of resident's narcotic use and is in agreement with current plan of care. We will attempt to wean resident as appropriate.  Ok Edwards NP [facility]  Contact [phone] Monday through Friday 8am- 5pm  After hours call [phone]

## 2019-05-04 LAB — NOVEL CORONAVIRUS, NAA (HOSP ORDER, SEND-OUT TO REF LAB; TAT 18-24 HRS): SARS-CoV-2, NAA: NOT DETECTED

## 2019-05-05 ENCOUNTER — Other Ambulatory Visit: Payer: Self-pay | Admitting: Adult Health

## 2019-05-08 ENCOUNTER — Other Ambulatory Visit (HOSPITAL_COMMUNITY)
Admission: RE | Admit: 2019-05-08 | Discharge: 2019-05-08 | Disposition: A | Discharge status: Home / Self Care | Payer: Medicare HMO | Source: Ambulatory Visit | Attending: Adult Health | Admitting: Adult Health

## 2019-05-08 DIAGNOSIS — Z20828 Contact with and (suspected) exposure to other viral communicable diseases: Secondary | ICD-10-CM | POA: Insufficient documentation

## 2019-05-09 LAB — NOVEL CORONAVIRUS, NAA (HOSP ORDER, SEND-OUT TO REF LAB; TAT 18-24 HRS): SARS-CoV-2, NAA: NOT DETECTED

## 2019-05-12 ENCOUNTER — Non-Acute Institutional Stay (SKILLED_NURSING_FACILITY): Payer: Medicare HMO | Admitting: Adult Health

## 2019-05-12 DIAGNOSIS — G894 Chronic pain syndrome: Secondary | ICD-10-CM

## 2019-05-13 ENCOUNTER — Encounter: Payer: Self-pay | Admitting: Adult Health

## 2019-05-13 NOTE — Progress Notes (Signed)
Location:  [place] Room Number: 119-P Place of Service:  SNF (31)   CODE STATUS: DNR  Allergies  Allergen Reactions  . Sulfa Antibiotics Other (See Comments)    Reaction:  Unknown   . Aspirin Other (See Comments)    Reaction:  Burning of pts stomach     Chief Complaint  Patient presents with  . Medication Management    Medication review    HPI:    Past Medical History:  Diagnosis Date  . Anemia   . Arthritis   . Cancer ([place])    uterine surg only  . CHF (congestive heart failure) ([place])   . Colitis 12/31/2012   presumed C.Diff. patient declined colonoscopy  . Dementia ([place])   . Encephalopathy   . Failure to thrive in adult   . Hypotension   . Kidney stone   . Muscle weakness (generalized)   . Osteoarthritis   . Pancreatic atrophy 12/31/2012  . Peptic ulcer   . Pneumonia   . Polyosteoarthritis   . Scoliosis   . Sick sinus syndrome ([place])   . Stroke [facility])     Past Surgical History:  Procedure Laterality Date  . ABDOMINAL HYSTERECTOMY    . CHOLECYSTECTOMY    . ESOPHAGOGASTRODUODENOSCOPY N/A 03/03/2013   RMR: Significant gastric ulcer without bleeding stigmata requiring no endoscopic intervention today. Noncritical Schatzki's ring. Small hiatal hernia  . ESOPHAGOGASTRODUODENOSCOPY N/A 08/24/2013   Dr. [name]: Hiatal hernia.  Gastric mucosal scar formation at site of previously noted gastric ulcerations  . INTRAMEDULLARY (IM) NAIL INTERTROCHANTERIC Left 01/10/2019   Procedure: INTRAMEDULLARY (IM) NAIL LEFT INTERTROCHANTRIC;  Surgeon: Leandrew Koyanagi, MD;  Location: [place];  Service: Orthopedics;  Laterality: Left;  . JOINT REPLACEMENT    . LEFT KNEE SURGERY AND REVISION    . RIGHT HIP SURGERY      Social History   Socioeconomic History  . Marital status: Widowed    Spouse name: Not on file  . Number of children: Not on file  . Years of education: Not on file  . Highest education level: Not on file  Occupational History  . Not on  file  Tobacco Use  . Smoking status: Former Smoker    Types: Cigarettes    Quit date: 04/25/1951    Years since quitting: 68.0  . Smokeless tobacco: Never Used  Substance and Sexual Activity  . Alcohol use: No    Alcohol/week: 1.0 standard drinks    Types: 1 Glasses of wine per week  . Drug use: No  . Sexual activity: Never  Other Topics Concern  . Not on file  Social History Narrative   Former cigarette smoker.  Widowed.   Social Determinants of Health   Financial Resource Strain:   . Difficulty of Paying Living Expenses: Not on file  Food Insecurity:   . Worried About Charity fundraiser in the Last Year: Not on file  . Ran Out of Food in the Last Year: Not on file  Transportation Needs:   . Lack of Transportation (Medical): Not on file  . Lack of Transportation (Non-Medical): Not on file  Physical Activity:   . Days of Exercise per Week: Not on file  . Minutes of Exercise per Session: Not on file  Stress:   . Feeling of Stress : Not on file  Social Connections:   . Frequency of Communication with Friends and Family: Not on file  . Frequency of Social Gatherings with Friends and Family:  Not on file  . Attends Religious Services: Not on file  . Active Member of Clubs or Organizations: Not on file  . Attends Archivist Meetings: Not on file  . Marital Status: Not on file  Intimate Partner Violence:   . Fear of Current or Ex-Partner: Not on file  . Emotionally Abused: Not on file  . Physically Abused: Not on file  . Sexually Abused: Not on file   Family History  Problem Relation Age of Onset  . Cancer Mother       VITAL SIGNS BP (!) 89/46   Pulse 80   Temp 98.6 F (37 C) (Oral)   Resp 20   Ht 4\' 5"  (1.346 m)   Wt 71 lb 3.2 oz (32.3 kg)   SpO2 93%   BMI 17.82 kg/m   Outpatient Encounter Medications as of 05/12/2019  Medication Sig  . acetaminophen (TYLENOL) 500 MG tablet Take 500 mg by mouth every 6 (six) hours as needed for mild pain,  moderate pain, fever or headache.   . albuterol (PROAIR HFA) 108 (90 BASE) MCG/ACT inhaler Inhale 2 puffs into the lungs every 6 (six) hours as needed for wheezing or shortness of breath.   Marland Kitchen alum & mag hydroxide-simeth (MAALOX REGULAR STRENGTH) 200-200-20 MG/5ML suspension Take 15 mLs by mouth every 6 (six) hours as needed for indigestion or heartburn.  Roseanne Kaufman Peru-Castor Oil (VENELEX) OINT Apply 1 application topically See admin instructions. Apply topically to sacrum and bilateral buttocks every shift and as needed for blanchable erythema   . docusate sodium (COLACE) 100 MG capsule Take 100 mg by mouth daily.   . magnesium hydroxide (MILK OF MAGNESIA) 400 MG/5ML suspension Take 30 mLs by mouth daily as needed for mild constipation.  Marland Kitchen morphine (ROXANOL) 20 MG/ML concentrated solution Take 5 mg by mouth every 2 (two) hours as needed for severe pain. For uncontrolled pain  . morphine (ROXANOL) 20 MG/ML concentrated solution Take 0.25 mLs (5 mg total) by mouth 2 (two) times daily. AND may have every 2 hours as needed for pain or distress  . NON FORMULARY Diet: Regular  . Nutritional Supplements (ENSURE ENLIVE PO) Take 1 Bottle by mouth 3 (three) times daily between meals.  . pantoprazole (PROTONIX) 40 MG tablet Take 40 mg by mouth every morning.   Vladimir Faster Glycol-Propyl Glycol (SYSTANE) 0.4-0.3 % SOLN Place 1 drop into both eyes 2 (two) times daily between meals.   . polyethylene glycol (MIRALAX / GLYCOLAX) packet Take 17 g by mouth daily.   . Wound Dressings (ALLEVYN ADHESIVE EX) Apply allevyn foam dressing topically to bilateral heels and change every 3 days and as needed for prevention   No facility-administered encounter medications on file as of 05/12/2019.     SIGNIFICANT DIAGNOSTIC EXAMS   PREVIOUS:   07-10-18: chest x-ray: No acute abnormality noted.  07-10-18: ct of head: Chronic atrophic and ischemic changes without acute abnormality.  07-11-18: 2-d echo:  1. The left ventricle  has normal systolic function with an ejection fraction of 60-65%. The cavity size was normal. Left ventricular diastolic Doppler parameters are indeterminate.  2. The right ventricle has normal systolic function. The cavity was normal. There is no increase in right ventricular wall thickness.  3. Left atrial size was moderately dilated.  4. The mitral valve is normal in structure. Mild thickening of the mitral valve leaflet. There is mild mitral annular calcification present.  5. The tricuspid valve is normal in structure.  6. The  aortic valve is tricuspid Mild thickening of the aortic valve.  7. The pulmonic valve was normal in structure.  8. Normal LV function; moderate LAE; mild MR and TR.  07-12-18: ct of head: 1. Atrophy and white matter microvascular disease not changed from prior. 2. No acute findings.  01-08-19: left hip fracture: Displaced and angulated oblique fracture of the proximal third of the left femoral diaphysis.  01-08-19: chest x-ray: No active disease. Severe scoliosis   01-08-19: left femur x-ray: Displaced and angulated oblique fracture of the proximal third of the left femoral diaphysis.  02/27/2019: bilateral hip and pelvic x-ray; Chronic changes of bilateral hips. No acute fracture or dislocation noted.  02-17-19: bilateral hip and pelvic x-ray: 1. Unchanged deformity of the right acetabulum. Intact total right hip arthroplasty. 2. No acute findings are evident.  02-17-19: right knee x-ray; Displaced distal femoral fracture with moderate lipohemarthrosis. No obvious intra-articular extension. CT could be considered for further characterization  NO NEW EXAMS.    LABS REVIEWED PREVIOUS:   07-10-18: wbc 6.3; hgb 12.3; hct 38.6; mcv 105.2; plt 219; glucose 108; bun 17 creat 0.67; k+ 3.8; na++ 142; ca 9.0 liver normal albumin 4.4 urine culture: no growth urine drug screen neg 07-11-18: chol 178; ldl 98  trig 65 hdl 67; tsh 0.831; hgb a1c 5.5 bit B 12 204  07-13-18: glucose 65;  bun 21; creat 0.76; k+ 3.2; an++ 139; ca 8.8 mag 1.8; phos 3.1 07-14-18: glucose 120; bun 11; creat 0.63; k+ 3.5; na++ 139  07-19-18: wbc 10.5; hgb 10.8; hct 32.9; mcv 99.4 plt 186; glucose bun 19; creat 0.53;  85; k+ 2.6; na++ 139 ca 8.4 07-26-18: wbc 14.2; hgb 12.3; hct 39.3; mcv 105.1; plt 405; glucose 96; bun 54; creat 0.84; k+ 4.0; na++ 148; ca 9.2  01-08-19: wbc 13.3; hgb 10.8; hct 35.0; mcv 100.9; plt 313; glucose 106; bun 22; creat 0.59; k+ 4.1; na++ 140; ca 8.3; liver normal albumin 3.1 01-12-19: wbc 13.5; hgb 7.9; hct 24.4; mcv 96.8; plt 267; glucose 92; bun 23; creat 0.78; k+ 3.1; na++ 142; ca 8.5  01-18-19: wbc 8.4; hgb 8.5; hct 27.2; mcv 103.4 plt 404; glucose 80; bun 22; creat 0.52; k+ 3.6 na++ 144; ca 8.2  02/24/2019: wbc 7.6; hgb 9.9; hct 32.7; mcv 107.2 ;plt 306; glucose 104; bun 20; creat 0.70; k+ 3.4; na++ 139; ca 8.5   NO NEW LABS.   Dorothy Spark of Systems  Unable to perform ROS: Dementia (unable to participate )   Physical Exam Constitutional:      General: She is not in acute distress.    Appearance: She is cachectic. She is not diaphoretic.  Neck:     Thyroid: No thyromegaly.  Cardiovascular:     Rate and Rhythm: Normal rate. Rhythm irregular.     Pulses: Normal pulses.     Heart sounds: Normal heart sounds.  Pulmonary:     Effort: Pulmonary effort is normal. No respiratory distress.     Breath sounds: Normal breath sounds.  Abdominal:     General: Bowel sounds are normal. There is no distension.     Palpations: Abdomen is soft.     Tenderness: There is no abdominal tenderness.  Musculoskeletal:     Cervical back: Neck supple.     Right lower leg: No edema.     Left lower leg: No edema.     Comments:  Is status post left femur fracture  Status post nail 01-09-19  Is able to move all  extremities     Lymphadenopathy:     Cervical: No cervical adenopathy.  Skin:    General: Skin is warm and dry.  Neurological:     Mental Status: She is alert. Mental status is at baseline.    Psychiatric:        Mood and Affect: Mood normal.       ASSESSMENT/ PLAN:  TODAY  1. Chronic pain syndrome: her pain is being adequately managed will continue roxanol 5 mg twice daily and every 2 hours as needed will monitor   MD is aware of resident's narcotic use and is in agreement with current plan of care. We will attempt to wean resident as appropriate.  Ok Edwards NP [facility]  Contact [phone] Monday through Friday 8am- 5pm  After hours call [phone]

## 2019-05-25 ENCOUNTER — Encounter: Payer: Self-pay | Admitting: Internal Medicine

## 2019-05-25 ENCOUNTER — Non-Acute Institutional Stay (SKILLED_NURSING_FACILITY): Payer: Medicare HMO | Admitting: Internal Medicine

## 2019-05-25 DIAGNOSIS — R627 Adult failure to thrive: Secondary | ICD-10-CM | POA: Diagnosis not present

## 2019-05-25 DIAGNOSIS — R69 Illness, unspecified: Secondary | ICD-10-CM | POA: Diagnosis not present

## 2019-05-25 DIAGNOSIS — E43 Unspecified severe protein-calorie malnutrition: Secondary | ICD-10-CM

## 2019-05-25 DIAGNOSIS — F411 Generalized anxiety disorder: Secondary | ICD-10-CM

## 2019-05-25 NOTE — Progress Notes (Signed)
Location:  [facility] Room Number: 119-P Place of Service:  SNF (31)  Hennie Duos, MD  Patient Care Team: Hennie Duos, MD as PCP - General (Internal Medicine) Dorothy Spark, MD as PCP - Cardiology (Cardiology) Constance Haw, MD as PCP - Electrophysiology (Cardiology) [name] Cristopher Estimable, MD as Consulting Physician (Gastroenterology) [facility] Gerlene Fee, NP as Nurse Practitioner (Geriatric Medicine)  Extended Emergency Contact Information Primary Emergency Contact: Schoenrock,Gloria [place] Phone: [phone] Relation: Daughter Secondary Emergency Contact: Hutchinson,Marilynn  [place] Phone: [phone] Relation: Daughter    Allergies: Sulfa antibiotics and Aspirin  Chief Complaint  Patient presents with  . Medical Management of Chronic Issues    Routine [place] visit    HPI: Patient is a 86 y.o. female who is being seen for routine issues of failure to thrive, generalized anxiety disorder, and protein calorie malnutrition.  Patient is at this time basically comfort care for her end-stage dementia.  Past Medical History:  Diagnosis Date  . Anemia   . Arthritis   . Cancer ([place])    uterine surg only  . CHF (congestive heart failure) ([place])   . Colitis 12/31/2012   presumed C.Diff. patient declined colonoscopy  . Dementia ([place])   . Encephalopathy   . Failure to thrive in adult   . Hypotension   . Kidney stone   . Muscle weakness (generalized)   . Osteoarthritis   . Pancreatic atrophy 12/31/2012  . Peptic ulcer   . Pneumonia   . Polyosteoarthritis   . Scoliosis   . Sick sinus syndrome ([place])   . Stroke [facility])     Past Surgical History:  Procedure Laterality Date  . ABDOMINAL HYSTERECTOMY    . CHOLECYSTECTOMY    . ESOPHAGOGASTRODUODENOSCOPY N/A 03/03/2013   RMR: Significant gastric ulcer without bleeding stigmata  requiring no endoscopic intervention today. Noncritical Schatzki's ring. Small hiatal hernia  . ESOPHAGOGASTRODUODENOSCOPY N/A 08/24/2013   Dr. [name]: Hiatal hernia.  Gastric mucosal scar formation at site of previously noted gastric ulcerations  . INTRAMEDULLARY (IM) NAIL INTERTROCHANTERIC Left 01/10/2019   Procedure: INTRAMEDULLARY (IM) NAIL LEFT INTERTROCHANTRIC;  Surgeon: Leandrew Koyanagi, MD;  Location: [facility];  Service: Orthopedics;  Laterality: Left;  . JOINT REPLACEMENT    . LEFT KNEE SURGERY AND REVISION    . RIGHT HIP SURGERY      Allergies as of 05/25/2019      Reactions   Sulfa Antibiotics Other (See Comments)   Reaction:  Unknown    Aspirin Other (See Comments)   Reaction:  Burning of pts stomach       Medication List    Notice   This visit is during an admission. Changes to the med list made in this visit will be reflected in the After Visit Summary of the admission.     No orders of the defined types were placed in this encounter.   Immunization History  Administered Date(s) Administered  . Influenza-Unspecified 02/14/2019  . PPD Test 07/15/2018, 07/28/2018    Social History   Tobacco Use  . Smoking status: Former Smoker    Types: Cigarettes    Quit date: 04/25/1951    Years since quitting: 68.1  . Smokeless tobacco: Never Used  Substance Use Topics  . Alcohol use: No    Alcohol/week: 1.0 standard drinks    Types: 1 Glasses of wine per week    Review  of Systems-unable to obtain secondary to dementia; nursing-no acute concerns     Vitals:   05/25/19 1649  BP: (!) 89/46  Pulse: 80  Resp: 20  Temp: 99 F (37.2 C)  SpO2: 93%   Body mass index is 17.82 kg/m. Physical Exam  GENERAL APPEARANCE: Alert, appears comfortable SKIN: No diaphoresis rash HEENT: Unremarkable RESPIRATORY: Breathing is even, unlabored. Lung sounds are clear   CARDIOVASCULAR: Heart RRR no murmurs, rubs or gallops. No peripheral edema  GASTROINTESTINAL: Abdomen is soft,  non-tender, not distended w/ normal bowel sounds.  GENITOURINARY: Bladder non tender, not distended  MUSCULOSKELETAL: No abnormal joints or musculature except except cachectic NEUROLOGIC: Cranial nerves 2-12 grossly intact. Moves all extremities PSYCHIATRIC: Dementia, no behavioral issues  Patient Active Problem List   Diagnosis Date Noted  . Displaced oblique fracture of shaft of left femur, sequela 01/08/2019  . Dementia without behavioral disturbance ([place]) 07/31/2018  . Generalized pain 07/31/2018  . Failure to thrive in adult 07/19/2018  . Generalized anxiety disorder 07/18/2018  . Tachy-brady syndrome ([place]) 07/18/2018  . Confusion 07/10/2018  . Chronic pain 07/10/2018  . Closed fracture of distal end of right humerus with routine healing 01/04/2018  . Multiple rib fractures 05/09/2016  . Falls 05/09/2016  . Leukocytosis 05/09/2016  . Chest pain 03/13/2015  . Other specified fever   . Pain in the chest   . Abdominal pain 10/28/2014  . Anemia 10/28/2014  . Hypokalemia 10/28/2014  . Protein-calorie malnutrition, severe ([place]) 10/28/2014  . Macrocytic anemia 08/13/2014  . Chronic diastolic CHF (congestive heart failure) ([place]) 08/13/2014  . PUD (peptic ulcer disease) 10/05/2013  . Acute gastric ulcer due to Helicobacter pylori [id number]  . Chronic constipation 02/23/2013  . Right kidney stone 12/31/2012  . Hypotension 12/31/2012  . Pancreatic atrophy 12/31/2012    CMP     Component Value Date/Time   NA 139 02/27/2019 1822   K 3.4 (L) 02/20/2019 1822   CL 107 02/20/2019 1822   CO2 24 02/11/2019 1822   GLUCOSE 104 (H) 02/11/2019 1822   BUN 20 02/18/2019 1822   CREATININE 0.70 02/22/2019 1822   CALCIUM 8.5 (L) 02/27/2019 1822   PROT 6.1 (L) 01/08/2019 0335   PROT 6.1 07/29/2013 0000   ALBUMIN 3.1 (L) 01/08/2019 0335   ALBUMIN 4.0 07/29/2013 0000   AST 18 01/08/2019 0335   AST 18 07/29/2013 0000   ALT 13 01/08/2019 0335   ALKPHOS 63 01/08/2019 0335   ALKPHOS 73.0  07/29/2013 0000   BILITOT 0.5 01/08/2019 0335   BILITOT 0.5 07/29/2013 0000   GFRNONAA >60 02/19/2019 1822   GFRAA >60 02/12/2019 1822   Recent Labs    07/13/18 1301 07/14/18 0721 01/12/19 0317 01/18/19 0905 03/05/2019 1822  NA 139   < > 142 144 139  K 3.2*   < > 3.1* 3.6 3.4*  CL 107   < > 103 109 107  CO2 17*   < > 26 28 24   GLUCOSE 65*   < > 92 80 104*  BUN 21   < > 23 22 20   CREATININE 0.76   < > 0.78 0.52 0.70  CALCIUM 8.8*   < > 8.5* 8.2* 8.5*  MG 1.8  --   --   --   --   PHOS 3.1  --   --   --   --    < > = values in this interval not displayed.   Recent Labs    07/10/18 1422 01/08/19 0335  AST 25 18  ALT 14 13  ALKPHOS 57 63  BILITOT 0.8 0.5  PROT 7.0 6.1*  ALBUMIN 4.4 3.1*   Recent Labs    07/10/18 1422 07/19/18 0730 01/08/19 0335 01/10/19 1630 01/12/19 0317 01/18/19 0905 02/17/2019 1822  WBC 6.3   < > 13.3*   < > 13.5* 8.4 7.6  NEUTROABS 4.1  --  10.5*  --   --   --  5.2  HGB 12.3   < > 10.8*   < > 7.9* 8.5* 9.9*  HCT 38.6   < > 35.0*   < > 24.4* 27.2* 32.7*  MCV 105.2*   < > 100.9*   < > 96.8 103.4* 107.2*  PLT 219   < > 313   < > 267 404* 306   < > = values in this interval not displayed.   Recent Labs    07/11/18 0429  CHOL 178  LDLCALC 98  TRIG 65   No results found for: [facility] Lab Results  Component Value Date   TSH 0.831 07/11/2018   Lab Results  Component Value Date   HGBA1C 5.5 07/11/2018   Lab Results  Component Value Date   CHOL 178 07/11/2018   HDL 67 07/11/2018   LDLCALC 98 07/11/2018   TRIG 65 07/11/2018   CHOLHDL 2.7 07/11/2018    Significant Diagnostic Results in last 30 days:  No results found.  Assessment and Plan  Failure to thrive in adult Patient's weight is in the mid 70s pounds; patient with poor appetite secondary to end-stage dementia; continue supportive care  Generalized anxiety disorder None treated with Roxanol 5 mg twice daily and every 2 as needed; appears controlled  Protein-calorie  malnutrition, severe ([place]) Patient received supplements with her meals in between meals; she does have end-stage dementia so she is really not hungry; continue supportive care     Hennie Duos, MD

## 2019-05-30 ENCOUNTER — Encounter: Payer: Self-pay | Admitting: Internal Medicine

## 2019-05-30 NOTE — Assessment & Plan Note (Signed)
Patient received supplements with her meals in between meals; she does have end-stage dementia so she is really not hungry; continue supportive care

## 2019-05-30 NOTE — Assessment & Plan Note (Signed)
Patient's weight is in the mid 70s pounds; patient with poor appetite secondary to end-stage dementia; continue supportive care

## 2019-05-30 NOTE — Assessment & Plan Note (Signed)
None treated with Roxanol 5 mg twice daily and every 2 as needed; appears controlled

## 2019-06-08 ENCOUNTER — Telehealth: Payer: Self-pay | Admitting: Family

## 2019-06-08 NOTE — Telephone Encounter (Signed)
Facility Nurse called stated patient was observed foaming from the mouth with eye fixed.Not breathing and no pulse.Patient was on comfort care.Order given to release body to funeral home desired by family.

## 2019-06-13 DEATH — deceased

## 2019-12-27 IMAGING — DX DG ABDOMEN ACUTE W/ 1V CHEST
3 series · 3 of 3 positions shown · non-contrast
Comparison: Chest radiographs 02/26/2017. Abdominal radiographs
09/13/2015.

CLINICAL DATA: [AGE] female with constipation.

EXAM:
DG ABDOMEN ACUTE W/ 1V CHEST

[abdomen erect]
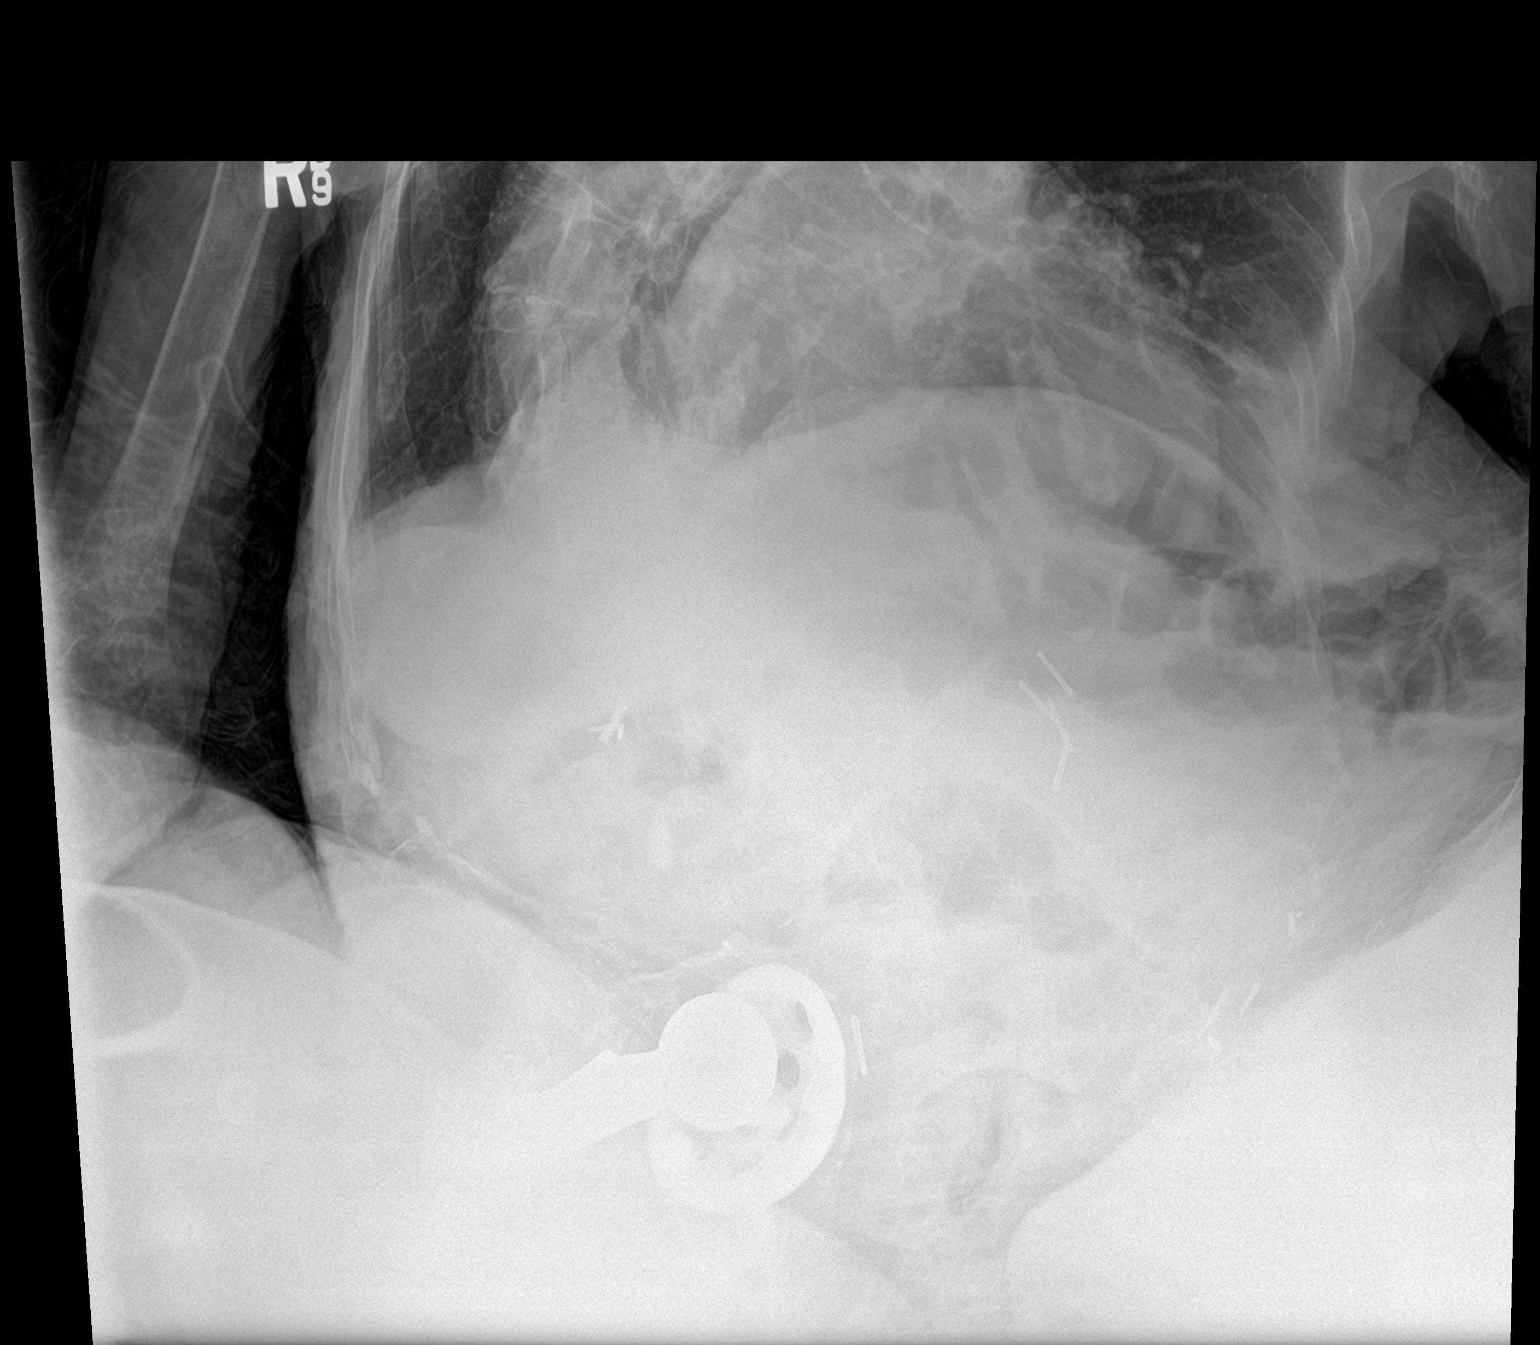

[abdomen supine]
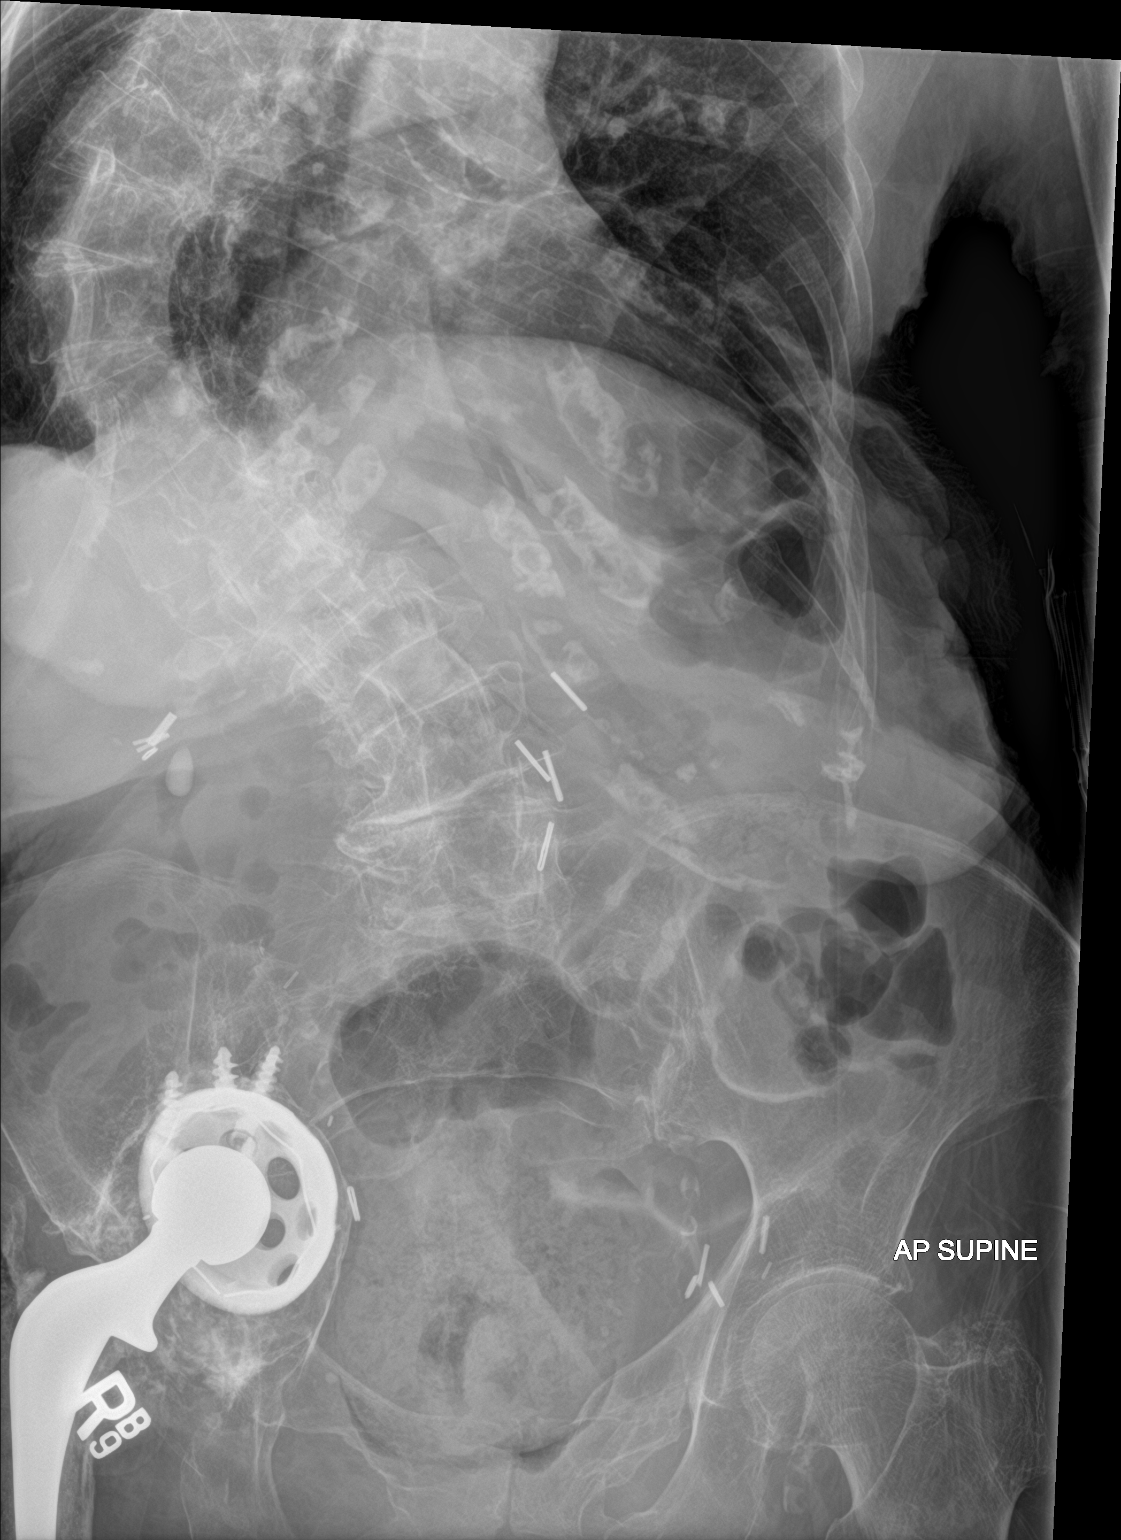

[chest ap]
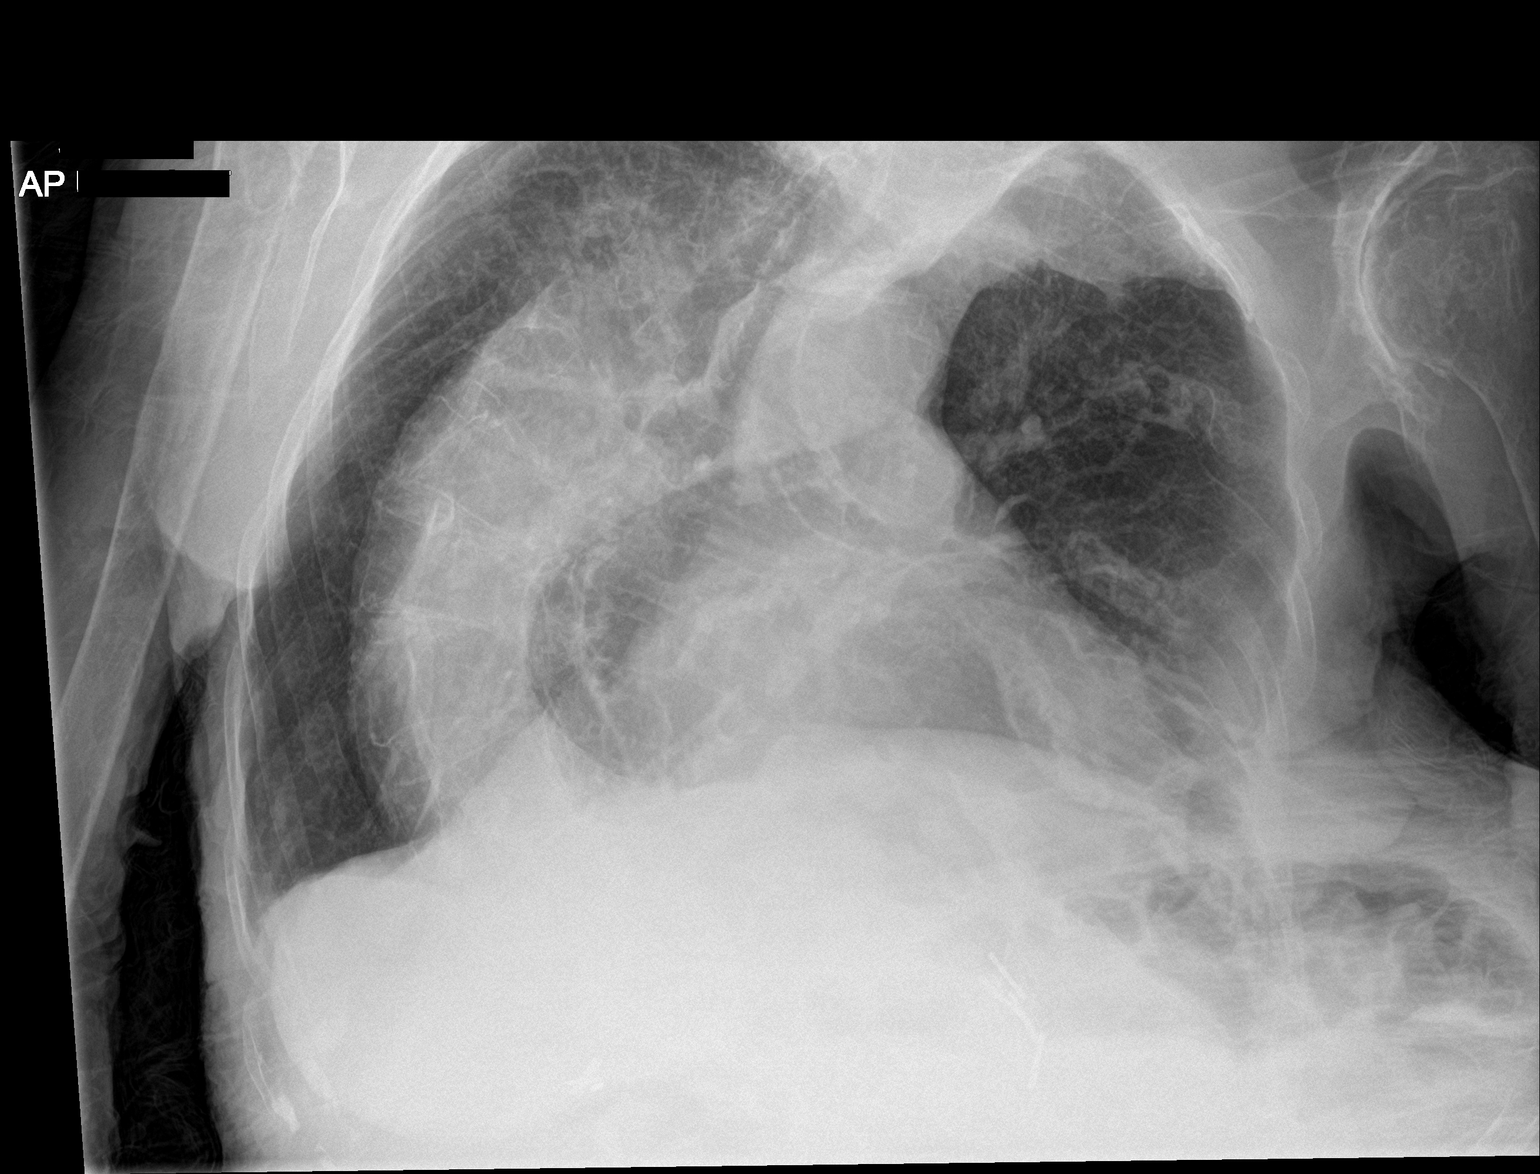

[3 of 3 positions shown; findings below may reference images not displayed]

FINDINGS: Seated upright AP view of the chest and abdomen with supine views of
the abdomen and pelvis. Severe dextroconvex thoracolumbar scoliosis
redemonstrated. Stable lung volumes and mediastinal contours. No
pneumothorax, pulmonary edema or confluent pulmonary opacity.

Stable surgical clips in all 4 quadrants of the abdomen. Chronic
right hip arthroplasty with protrusio acetabula. No
pneumoperitoneum. Non obstructed bowel gas pattern. Stool ball in
the rectum. Chronic right renal hilum calcification is stable.
IMPRESSION: 1. Stool ball in the rectum raising the possibility of fecal
impaction.
2.  Normal bowel gas pattern, no free air.
3.  No acute cardiopulmonary abnormality.
4. Moderate to severe dextroconvex scoliosis. Chronic right
nephrolithiasis.

## 2020-05-10 IMAGING — DX DG CHEST 2V
2 series · 2 of 2 positions shown · non-contrast
Comparison: 03/11/2018

CLINICAL DATA: Altered mental status

EXAM:
CHEST - 2 VIEW

[chest lat]
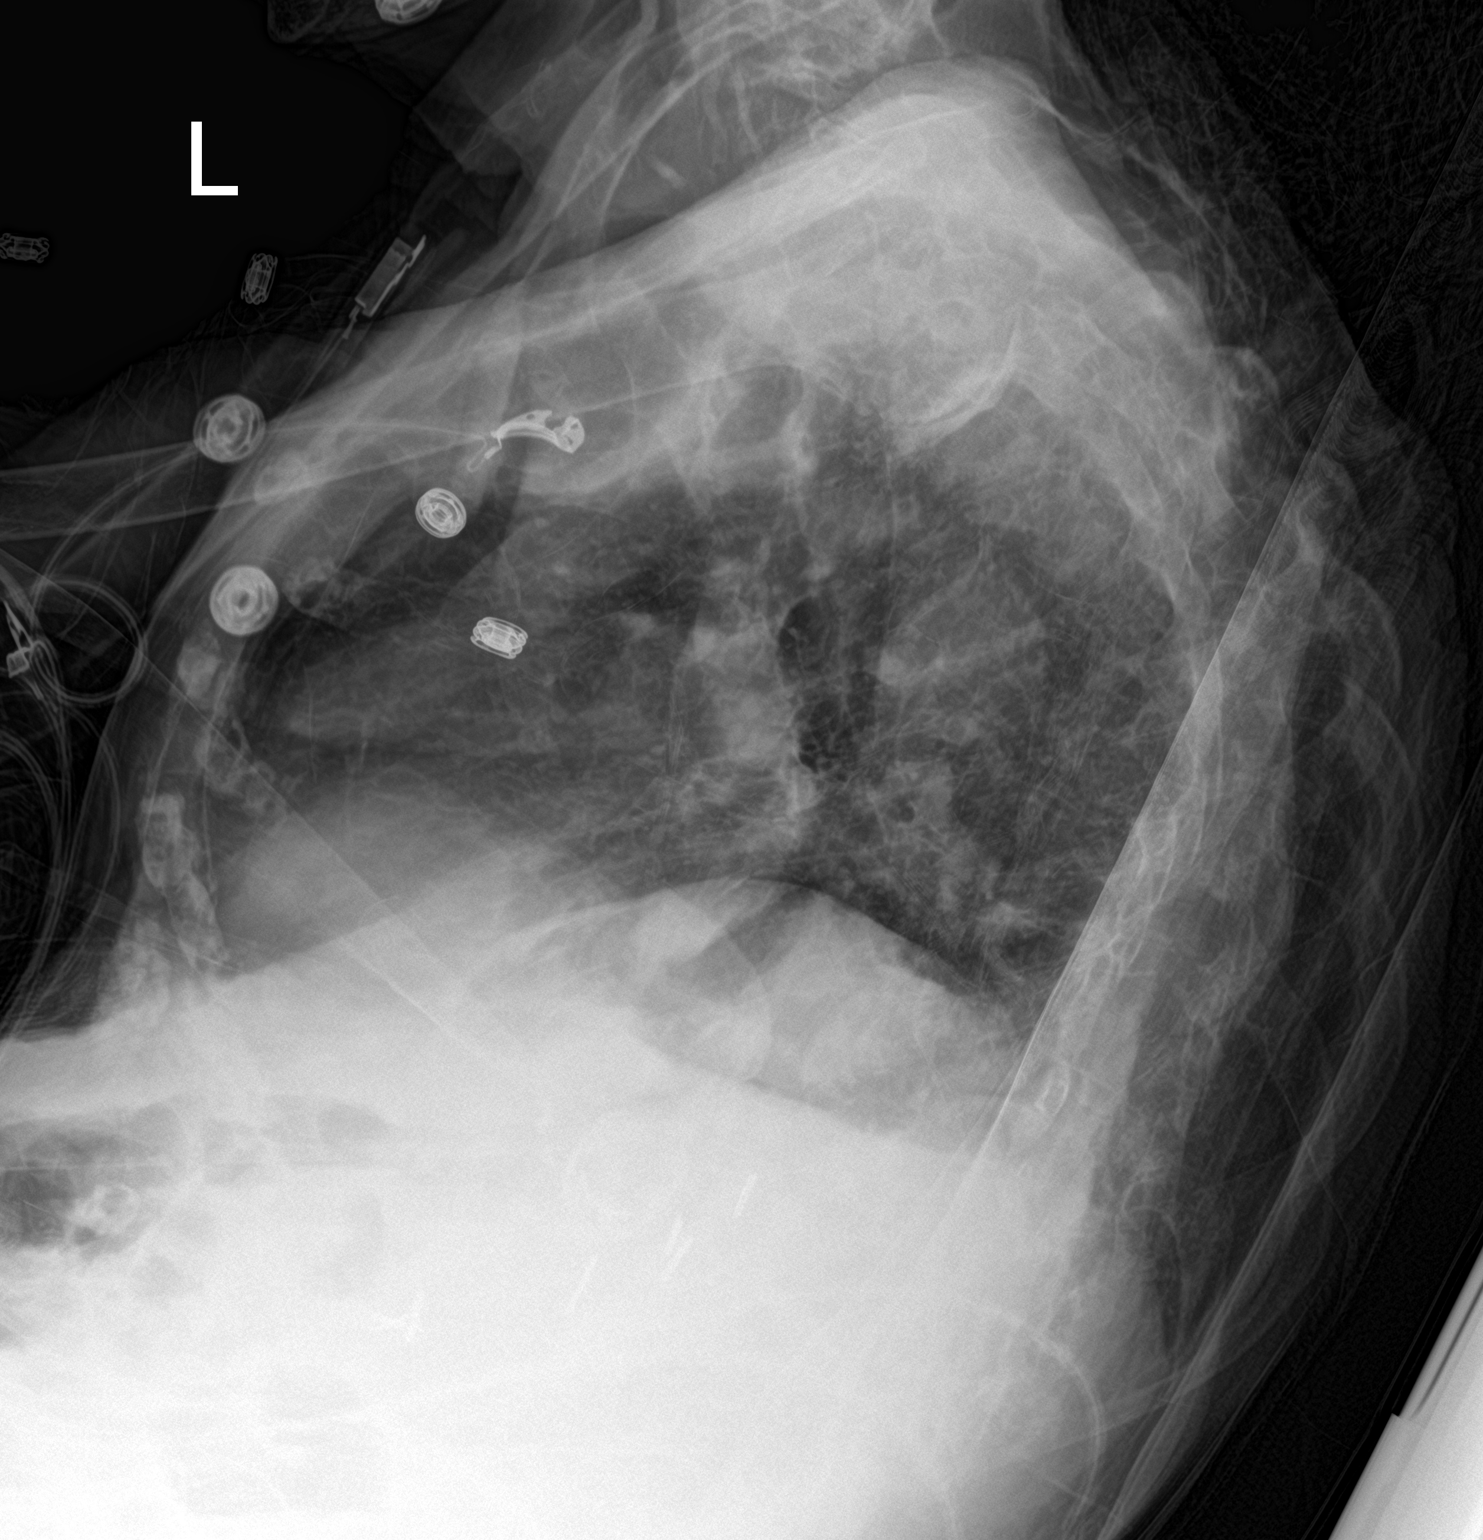

[chest ap]
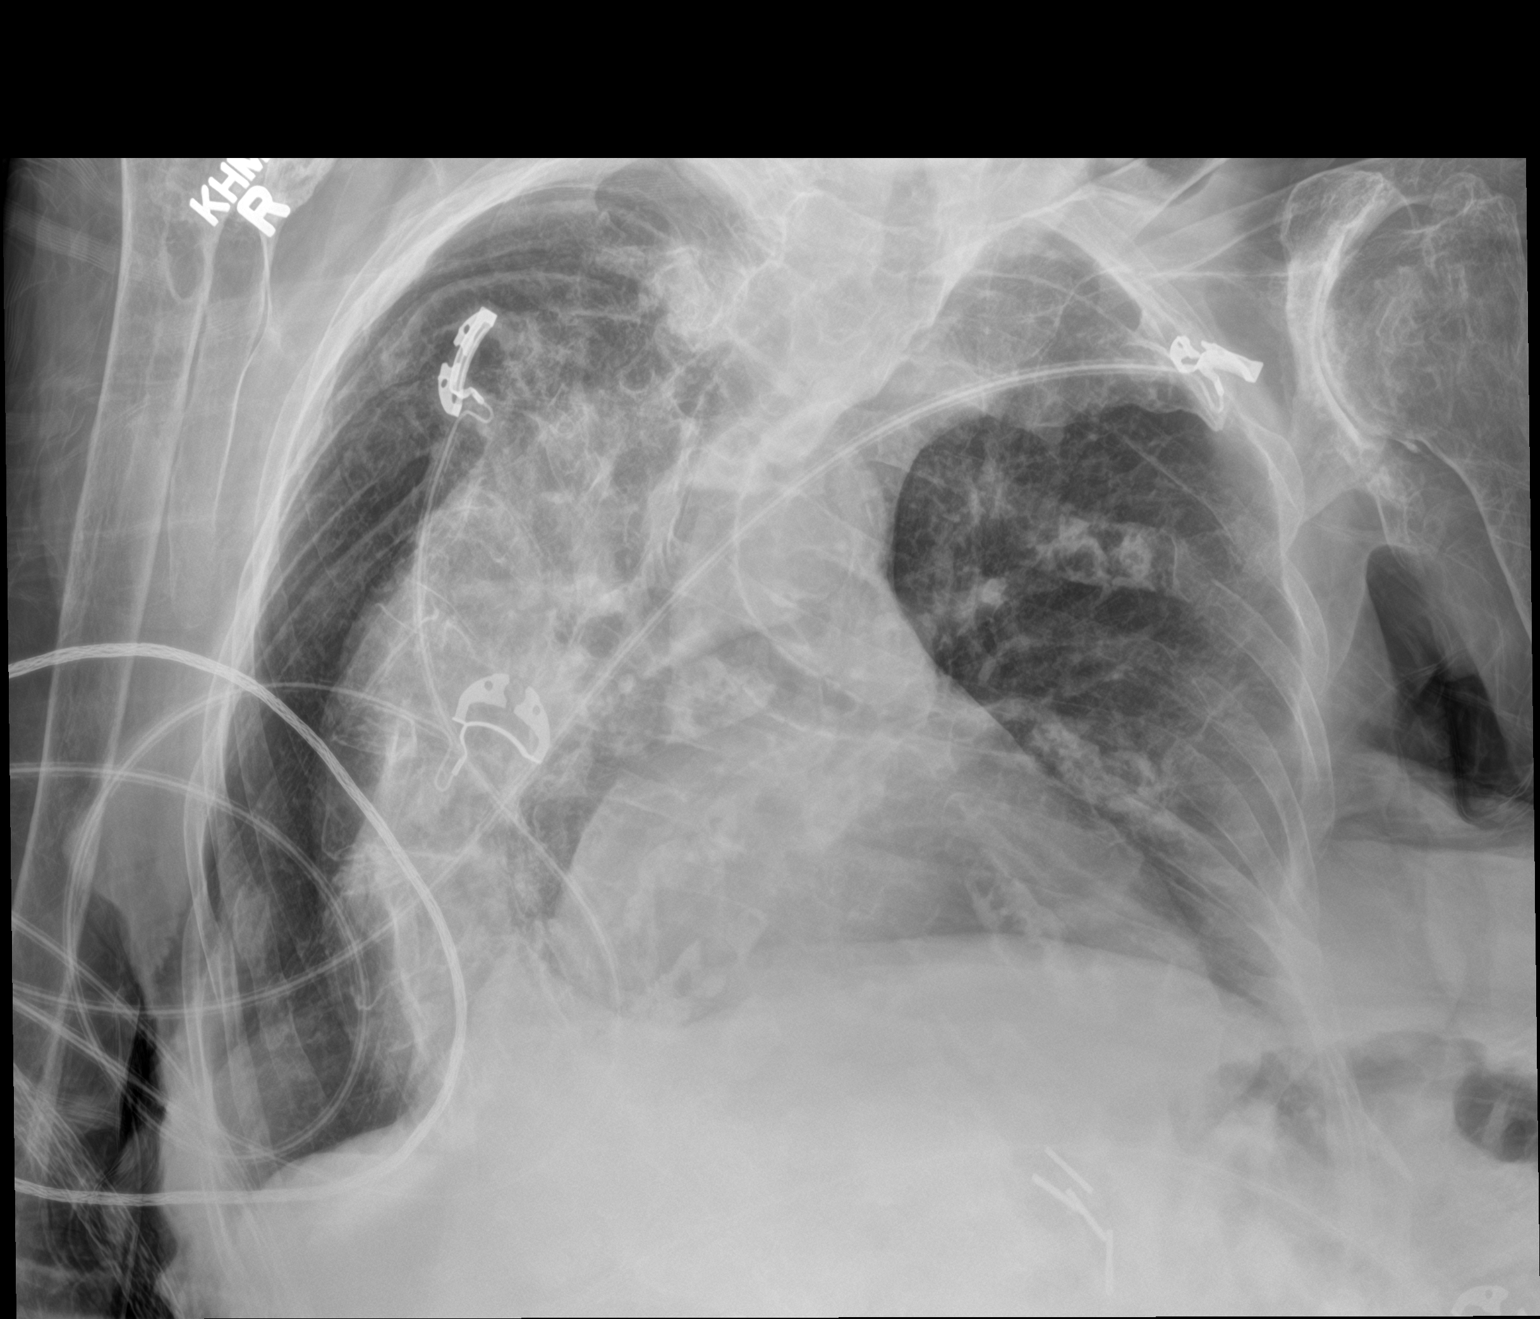

[2 of 2 positions shown; findings below may reference images not displayed]

FINDINGS: Cardiac shadow is stable. Stable scoliosis concave to the left is
noted. No focal infiltrate or effusion is seen. No acute bony
abnormality is noted.
IMPRESSION: No acute abnormality noted.

## 2020-11-08 IMAGING — DX CHEST  1 VIEW
1 series · 1 of 1 positions shown · non-contrast
Comparison: 07/10/2018

CLINICAL DATA: Leg injury

EXAM:
CHEST  1 VIEW

[chest pa]
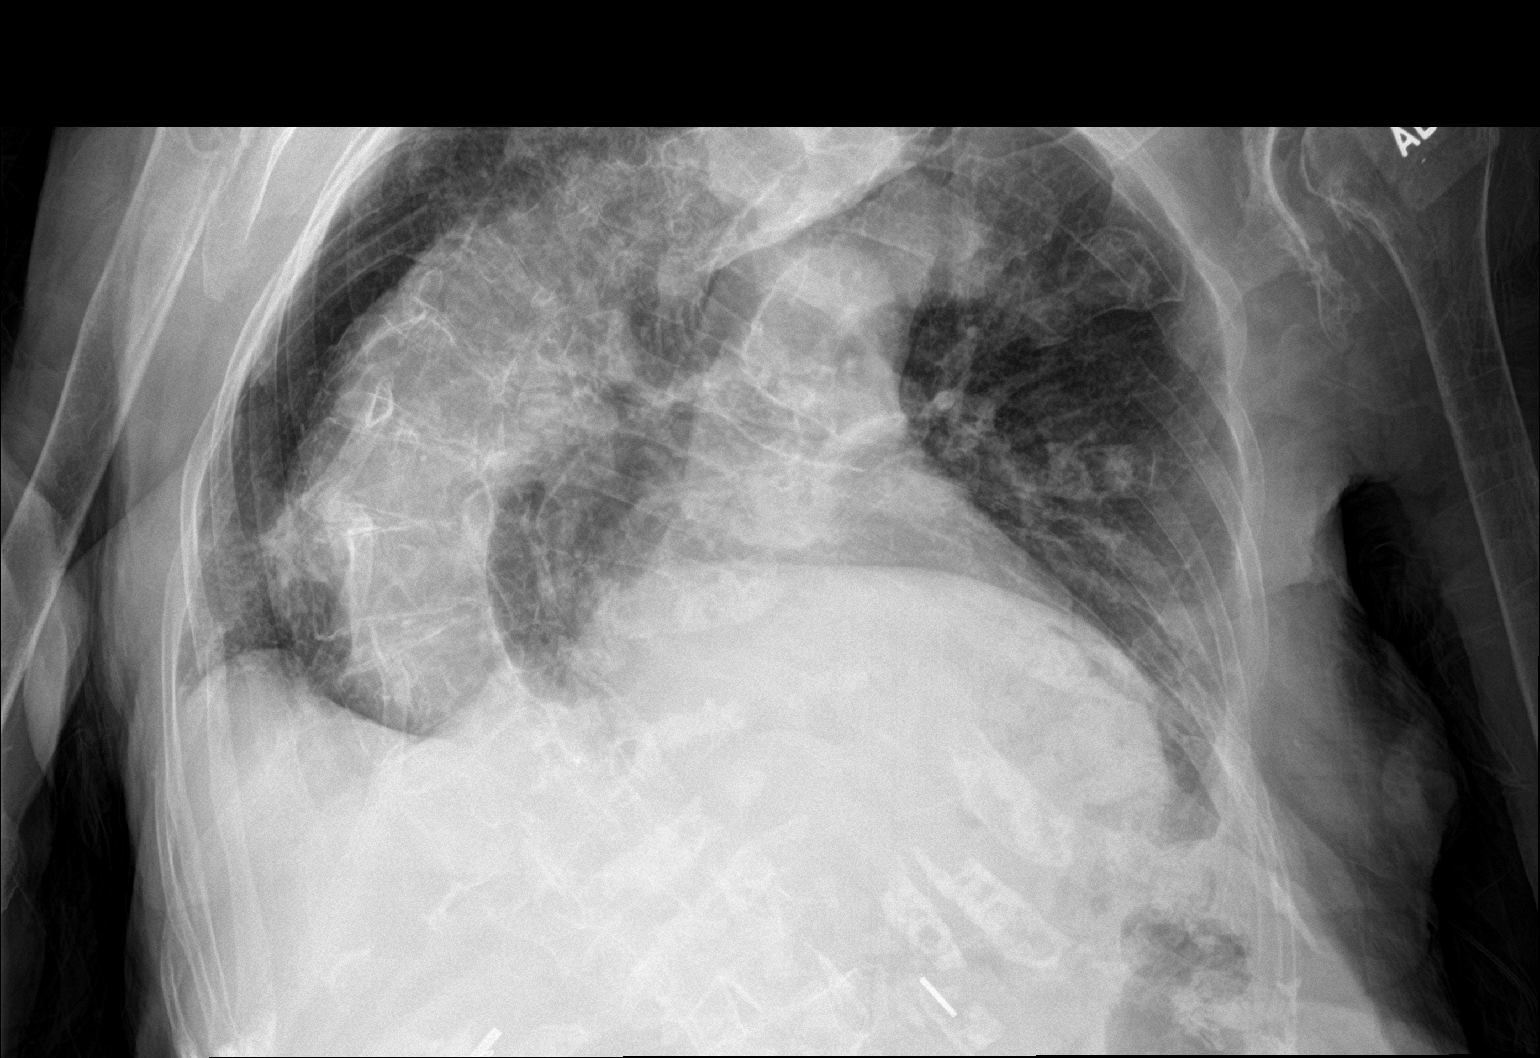

[1 of 1 positions shown; findings below may reference images not displayed]

FINDINGS: Severe scoliosis and thoracic cage deformity. No acute airspace
disease or effusion. Stable cardiomediastinal silhouette. No
pneumothorax. Old bilateral rib fractures.
IMPRESSION: No active disease.  Severe scoliosis.
